# Patient Record
Sex: Female | Born: 1965
Health system: Southern US, Community
[De-identification: ages and names within clinical notes are randomized; demographics above are authoritative.]

## PROBLEM LIST (undated history)

## (undated) DIAGNOSIS — F329 Major depressive disorder, single episode, unspecified: Secondary | ICD-10-CM

## (undated) DIAGNOSIS — F32A Depression, unspecified: Secondary | ICD-10-CM

## (undated) DIAGNOSIS — I10 Essential (primary) hypertension: Secondary | ICD-10-CM

## (undated) DIAGNOSIS — F411 Generalized anxiety disorder: Secondary | ICD-10-CM

## (undated) DIAGNOSIS — K219 Gastro-esophageal reflux disease without esophagitis: Secondary | ICD-10-CM

## (undated) HISTORY — DX: Depression, unspecified: F32.A

## (undated) HISTORY — DX: Morbid (severe) obesity due to excess calories: E66.01

## (undated) HISTORY — DX: Generalized anxiety disorder: F41.1

## (undated) HISTORY — DX: Gastro-esophageal reflux disease without esophagitis: K21.9

---
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
## 1998-02-11 ENCOUNTER — Emergency Department (HOSPITAL_COMMUNITY): Admission: EM | Admit: 1998-02-11 | Discharge: 1998-02-11 | Payer: Self-pay | Admitting: Emergency Medicine

## 1998-06-01 ENCOUNTER — Ambulatory Visit (HOSPITAL_COMMUNITY): Admission: RE | Admit: 1998-06-01 | Discharge: 1998-06-01 | Payer: Self-pay | Admitting: Family Medicine

## 1998-06-01 ENCOUNTER — Encounter: Payer: Self-pay | Admitting: Family Medicine

## 1998-07-26 ENCOUNTER — Emergency Department (HOSPITAL_COMMUNITY): Admission: EM | Admit: 1998-07-26 | Discharge: 1998-07-26 | Payer: Self-pay | Admitting: *Deleted

## 1999-04-01 ENCOUNTER — Encounter: Admission: RE | Admit: 1999-04-01 | Discharge: 1999-04-01 | Payer: Self-pay | Admitting: Sports Medicine

## 1999-04-01 ENCOUNTER — Other Ambulatory Visit: Admission: RE | Admit: 1999-04-01 | Discharge: 1999-04-01 | Payer: Self-pay | Admitting: *Deleted

## 1999-05-04 ENCOUNTER — Encounter: Admission: RE | Admit: 1999-05-04 | Discharge: 1999-05-04 | Payer: Self-pay | Admitting: Family Medicine

## 1999-06-07 ENCOUNTER — Encounter: Admission: RE | Admit: 1999-06-07 | Discharge: 1999-06-07 | Payer: Self-pay | Admitting: Family Medicine

## 1999-06-16 ENCOUNTER — Encounter: Admission: RE | Admit: 1999-06-16 | Discharge: 1999-06-16 | Payer: Self-pay | Admitting: Family Medicine

## 1999-07-01 ENCOUNTER — Encounter: Admission: RE | Admit: 1999-07-01 | Discharge: 1999-07-01 | Payer: Self-pay | Admitting: Family Medicine

## 1999-08-11 ENCOUNTER — Encounter: Payer: Self-pay | Admitting: Family Medicine

## 1999-08-11 ENCOUNTER — Emergency Department (HOSPITAL_COMMUNITY): Admission: EM | Admit: 1999-08-11 | Discharge: 1999-08-11 | Payer: Self-pay | Admitting: Emergency Medicine

## 1999-08-11 ENCOUNTER — Ambulatory Visit (HOSPITAL_COMMUNITY): Admission: RE | Admit: 1999-08-11 | Discharge: 1999-08-11 | Payer: Self-pay | Admitting: Family Medicine

## 1999-08-11 ENCOUNTER — Encounter: Admission: RE | Admit: 1999-08-11 | Discharge: 1999-08-11 | Payer: Self-pay | Admitting: Family Medicine

## 1999-08-12 ENCOUNTER — Encounter: Admission: RE | Admit: 1999-08-12 | Discharge: 1999-08-12 | Payer: Self-pay | Admitting: Family Medicine

## 1999-08-19 ENCOUNTER — Encounter: Admission: RE | Admit: 1999-08-19 | Discharge: 1999-08-19 | Payer: Self-pay | Admitting: Family Medicine

## 1999-08-22 ENCOUNTER — Encounter: Admission: RE | Admit: 1999-08-22 | Discharge: 1999-08-22 | Payer: Self-pay | Admitting: Family Medicine

## 1999-08-30 ENCOUNTER — Other Ambulatory Visit: Admission: RE | Admit: 1999-08-30 | Discharge: 1999-09-22 | Payer: Self-pay | Admitting: *Deleted

## 1999-08-30 ENCOUNTER — Encounter: Admission: RE | Admit: 1999-08-30 | Discharge: 1999-08-30 | Payer: Self-pay | Admitting: Sports Medicine

## 1999-11-14 ENCOUNTER — Encounter: Admission: RE | Admit: 1999-11-14 | Discharge: 1999-11-14 | Payer: Self-pay | Admitting: Family Medicine

## 1999-11-25 ENCOUNTER — Encounter: Payer: Self-pay | Admitting: Emergency Medicine

## 1999-11-25 ENCOUNTER — Emergency Department (HOSPITAL_COMMUNITY): Admission: EM | Admit: 1999-11-25 | Discharge: 1999-11-25 | Payer: Self-pay | Admitting: Emergency Medicine

## 1999-12-15 ENCOUNTER — Encounter: Admission: RE | Admit: 1999-12-15 | Discharge: 1999-12-15 | Payer: Self-pay | Admitting: Family Medicine

## 2000-07-02 ENCOUNTER — Emergency Department (HOSPITAL_COMMUNITY): Admission: EM | Admit: 2000-07-02 | Discharge: 2000-07-02 | Payer: Self-pay | Admitting: Emergency Medicine

## 2000-11-15 ENCOUNTER — Emergency Department (HOSPITAL_COMMUNITY): Admission: EM | Admit: 2000-11-15 | Discharge: 2000-11-15 | Payer: Self-pay

## 2001-11-15 ENCOUNTER — Encounter: Admission: RE | Admit: 2001-11-15 | Discharge: 2001-11-15 | Payer: Self-pay | Admitting: Family Medicine

## 2001-12-11 ENCOUNTER — Encounter: Admission: RE | Admit: 2001-12-11 | Discharge: 2001-12-11 | Payer: Self-pay | Admitting: Family Medicine

## 2002-02-27 ENCOUNTER — Ambulatory Visit (HOSPITAL_COMMUNITY): Admission: RE | Admit: 2002-02-27 | Discharge: 2002-02-27 | Payer: Self-pay | Admitting: Family Medicine

## 2002-02-27 ENCOUNTER — Encounter: Admission: RE | Admit: 2002-02-27 | Discharge: 2002-02-27 | Payer: Self-pay | Admitting: Family Medicine

## 2002-03-10 ENCOUNTER — Encounter: Admission: RE | Admit: 2002-03-10 | Discharge: 2002-03-10 | Payer: Self-pay | Admitting: Sports Medicine

## 2004-01-29 ENCOUNTER — Emergency Department (HOSPITAL_COMMUNITY): Admission: EM | Admit: 2004-01-29 | Discharge: 2004-01-29 | Payer: Self-pay | Admitting: Emergency Medicine

## 2004-11-07 ENCOUNTER — Emergency Department (HOSPITAL_COMMUNITY): Admission: EM | Admit: 2004-11-07 | Discharge: 2004-11-07 | Payer: Self-pay | Admitting: Emergency Medicine

## 2005-09-03 ENCOUNTER — Encounter (INDEPENDENT_AMBULATORY_CARE_PROVIDER_SITE_OTHER): Payer: Self-pay | Admitting: *Deleted

## 2005-09-21 ENCOUNTER — Encounter (INDEPENDENT_AMBULATORY_CARE_PROVIDER_SITE_OTHER): Payer: Self-pay | Admitting: *Deleted

## 2005-09-21 ENCOUNTER — Other Ambulatory Visit: Admission: RE | Admit: 2005-09-21 | Discharge: 2005-09-21 | Payer: Self-pay | Admitting: Family Medicine

## 2005-09-21 ENCOUNTER — Ambulatory Visit: Payer: Self-pay | Admitting: Family Medicine

## 2005-10-03 ENCOUNTER — Encounter: Admission: RE | Admit: 2005-10-03 | Discharge: 2005-10-03 | Payer: Self-pay | Admitting: Sports Medicine

## 2006-04-10 ENCOUNTER — Encounter: Admission: RE | Admit: 2006-04-10 | Discharge: 2006-04-10 | Payer: Self-pay | Admitting: Sports Medicine

## 2006-04-10 ENCOUNTER — Ambulatory Visit (HOSPITAL_COMMUNITY): Admission: RE | Admit: 2006-04-10 | Discharge: 2006-04-10 | Payer: Self-pay | Admitting: Family Medicine

## 2006-04-10 ENCOUNTER — Ambulatory Visit: Payer: Self-pay | Admitting: Family Medicine

## 2006-04-10 IMAGING — CR DG CHEST 2V
2 series · 2 of 2 positions shown · non-contrast
Comparison: none

CLINICAL DATA: Mid chest pain.  Slight cough.  Smoking history. 
 CHEST ? 2 VIEW:

[view not recorded (1 of 2)]
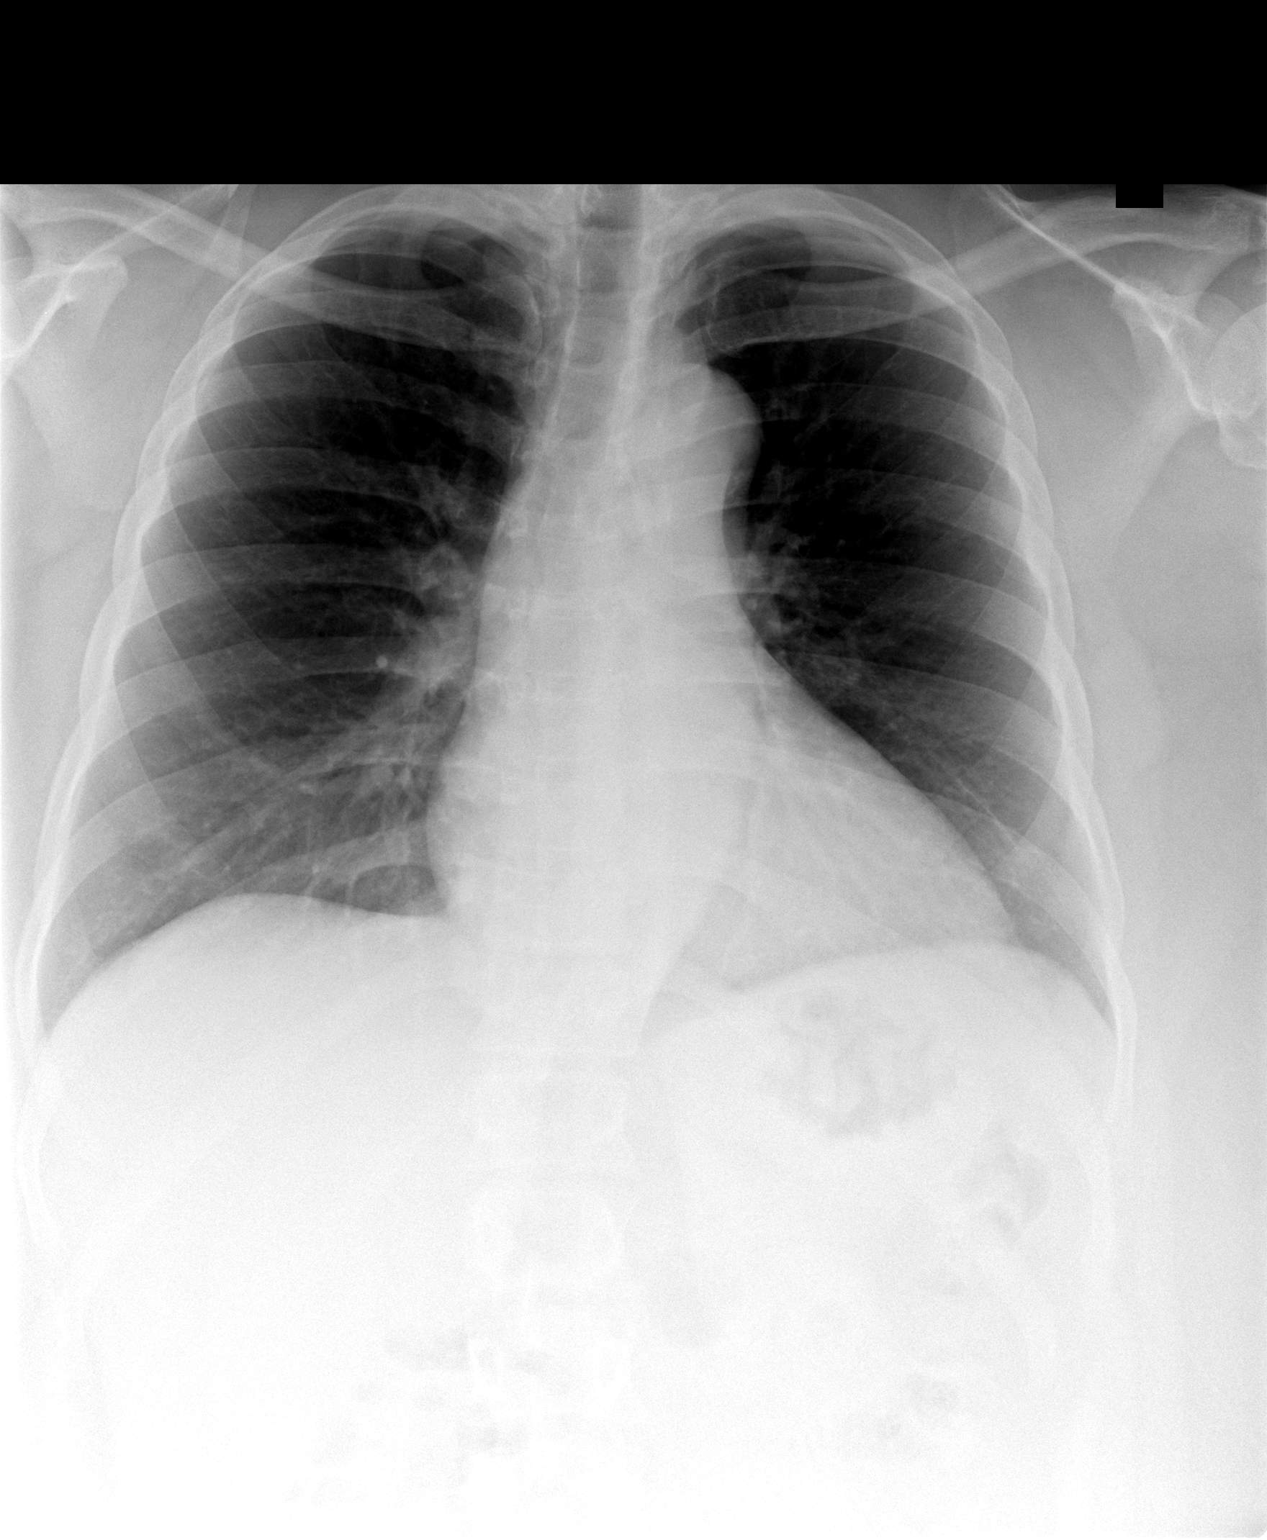

[view not recorded (2 of 2)]
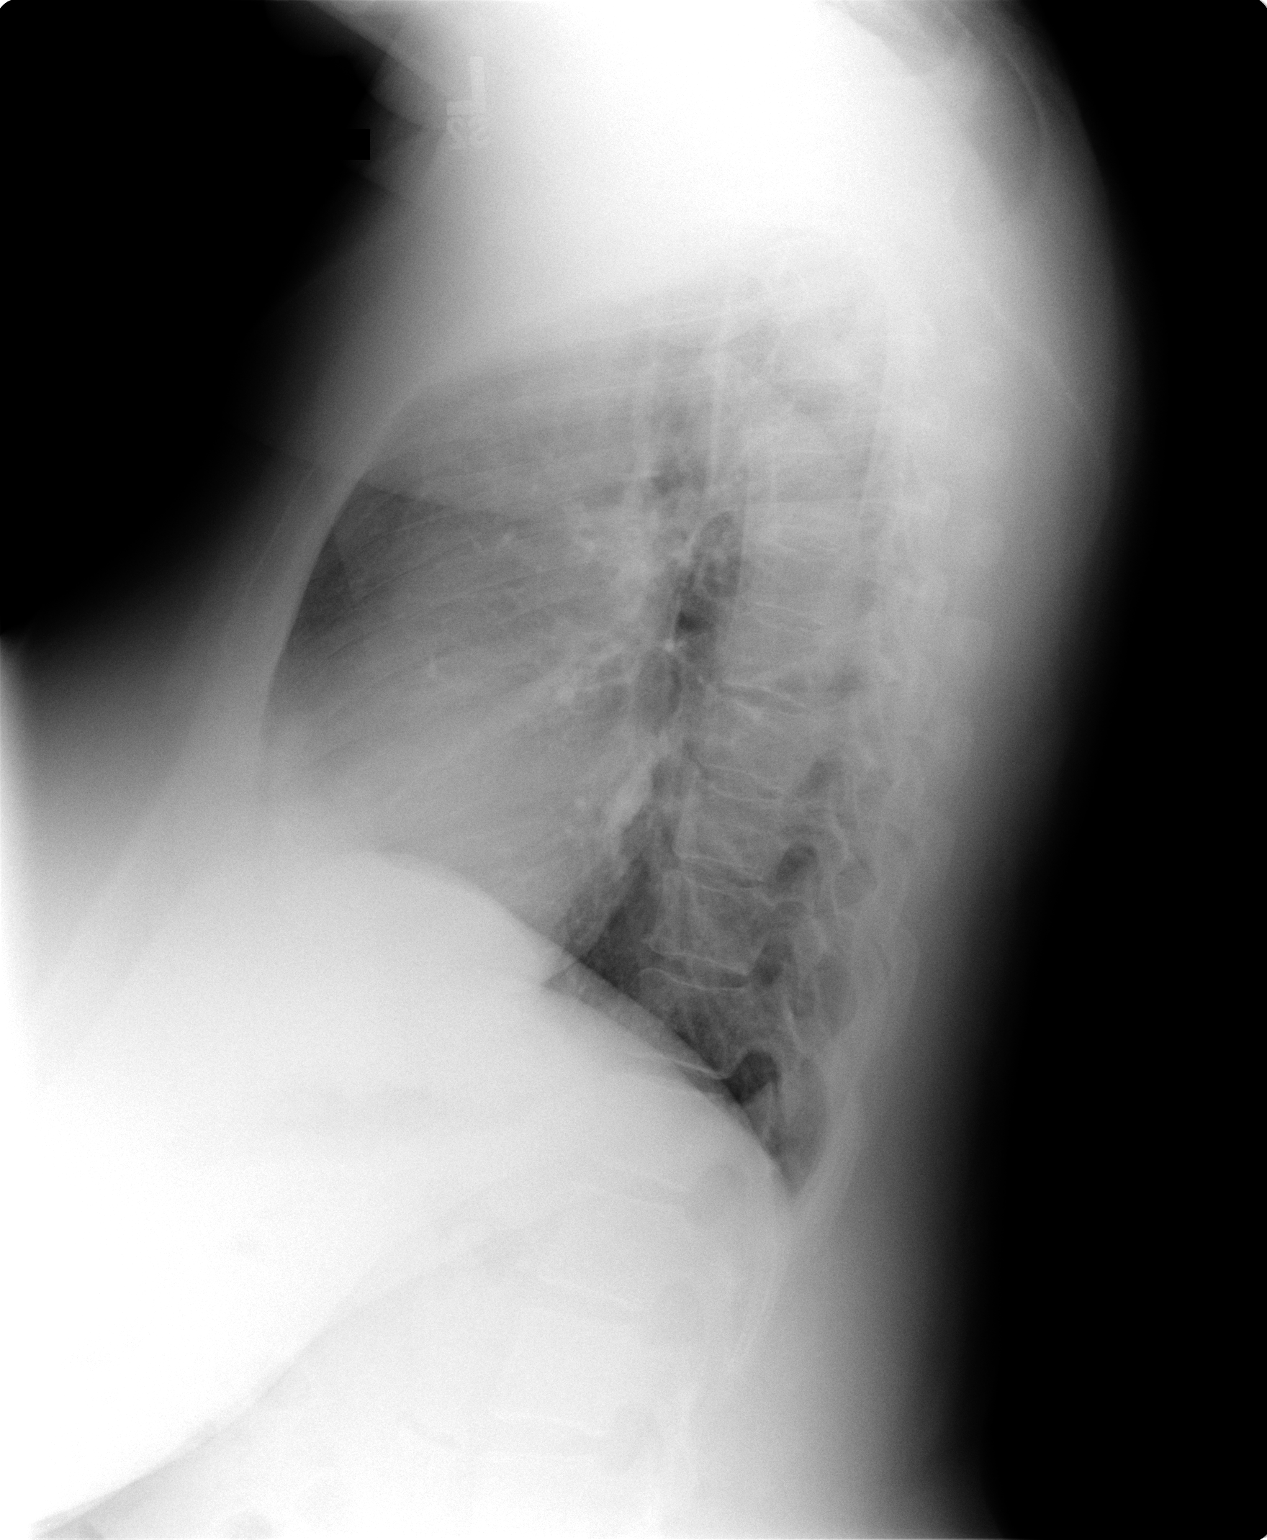

[2 of 2 positions shown; findings below may reference images not displayed]

FINDINGS: Two views of the chest show the lungs to be clear.  The heart is within upper limits of normal.  No acute bony abnormality is seen.
IMPRESSION: No active lung disease.

## 2006-04-11 ENCOUNTER — Ambulatory Visit: Payer: Self-pay | Admitting: Family Medicine

## 2006-04-20 ENCOUNTER — Ambulatory Visit: Payer: Self-pay | Admitting: Family Medicine

## 2006-07-16 ENCOUNTER — Ambulatory Visit: Payer: Self-pay | Admitting: Family Medicine

## 2006-08-02 DIAGNOSIS — Z720 Tobacco use: Secondary | ICD-10-CM | POA: Insufficient documentation

## 2006-08-02 DIAGNOSIS — K219 Gastro-esophageal reflux disease without esophagitis: Secondary | ICD-10-CM

## 2006-08-02 DIAGNOSIS — I10 Essential (primary) hypertension: Secondary | ICD-10-CM

## 2006-08-02 DIAGNOSIS — F329 Major depressive disorder, single episode, unspecified: Secondary | ICD-10-CM

## 2006-08-02 DIAGNOSIS — F17200 Nicotine dependence, unspecified, uncomplicated: Secondary | ICD-10-CM

## 2006-08-02 DIAGNOSIS — E669 Obesity, unspecified: Secondary | ICD-10-CM

## 2006-08-02 DIAGNOSIS — M545 Low back pain: Secondary | ICD-10-CM

## 2006-08-02 HISTORY — DX: Gastro-esophageal reflux disease without esophagitis: K21.9

## 2006-08-02 HISTORY — DX: Essential (primary) hypertension: I10

## 2006-08-03 ENCOUNTER — Encounter (INDEPENDENT_AMBULATORY_CARE_PROVIDER_SITE_OTHER): Payer: Self-pay | Admitting: *Deleted

## 2006-12-11 ENCOUNTER — Encounter (INDEPENDENT_AMBULATORY_CARE_PROVIDER_SITE_OTHER): Payer: Self-pay | Admitting: Family Medicine

## 2006-12-11 ENCOUNTER — Ambulatory Visit: Payer: Self-pay | Admitting: Family Medicine

## 2006-12-11 DIAGNOSIS — I10 Essential (primary) hypertension: Secondary | ICD-10-CM

## 2006-12-12 ENCOUNTER — Encounter: Payer: Self-pay | Admitting: Family Medicine

## 2006-12-12 LAB — CONVERTED CEMR LAB: Pap Smear: NORMAL

## 2006-12-19 ENCOUNTER — Encounter: Payer: Self-pay | Admitting: Family Medicine

## 2006-12-27 ENCOUNTER — Telehealth (INDEPENDENT_AMBULATORY_CARE_PROVIDER_SITE_OTHER): Payer: Self-pay | Admitting: *Deleted

## 2006-12-31 ENCOUNTER — Encounter: Payer: Self-pay | Admitting: Family Medicine

## 2006-12-31 ENCOUNTER — Ambulatory Visit: Payer: Self-pay | Admitting: Family Medicine

## 2006-12-31 DIAGNOSIS — N926 Irregular menstruation, unspecified: Secondary | ICD-10-CM

## 2006-12-31 DIAGNOSIS — K644 Residual hemorrhoidal skin tags: Secondary | ICD-10-CM | POA: Insufficient documentation

## 2006-12-31 LAB — CONVERTED CEMR LAB: Beta hcg, urine, semiquantitative: NEGATIVE

## 2007-01-03 ENCOUNTER — Encounter: Payer: Self-pay | Admitting: Family Medicine

## 2007-02-27 ENCOUNTER — Encounter (INDEPENDENT_AMBULATORY_CARE_PROVIDER_SITE_OTHER): Payer: Self-pay | Admitting: Family Medicine

## 2007-02-27 ENCOUNTER — Telehealth (INDEPENDENT_AMBULATORY_CARE_PROVIDER_SITE_OTHER): Payer: Self-pay | Admitting: *Deleted

## 2007-02-27 ENCOUNTER — Ambulatory Visit: Payer: Self-pay | Admitting: Family Medicine

## 2007-02-27 DIAGNOSIS — A088 Other specified intestinal infections: Secondary | ICD-10-CM

## 2007-03-07 ENCOUNTER — Encounter: Admission: RE | Admit: 2007-03-07 | Discharge: 2007-03-07 | Payer: Self-pay | Admitting: Sports Medicine

## 2007-03-08 ENCOUNTER — Encounter: Payer: Self-pay | Admitting: Family Medicine

## 2007-09-05 ENCOUNTER — Encounter (INDEPENDENT_AMBULATORY_CARE_PROVIDER_SITE_OTHER): Payer: Self-pay | Admitting: *Deleted

## 2007-10-04 ENCOUNTER — Ambulatory Visit (HOSPITAL_COMMUNITY): Admission: RE | Admit: 2007-10-04 | Discharge: 2007-10-04 | Payer: Self-pay | Admitting: Family Medicine

## 2007-10-04 ENCOUNTER — Ambulatory Visit: Payer: Self-pay | Admitting: Family Medicine

## 2007-10-04 ENCOUNTER — Encounter: Payer: Self-pay | Admitting: Family Medicine

## 2007-10-04 DIAGNOSIS — R079 Chest pain, unspecified: Secondary | ICD-10-CM

## 2007-10-04 HISTORY — DX: Chest pain, unspecified: R07.9

## 2007-10-04 LAB — CONVERTED CEMR LAB
BUN: 11 mg/dL (ref 6–23)
Chloride: 103 meq/L (ref 96–112)
Creatinine, Ser: 0.99 mg/dL (ref 0.40–1.20)
Glucose, Bld: 98 mg/dL (ref 70–99)
LDL Cholesterol: 79 mg/dL (ref 0–99)
Potassium: 3.7 meq/L (ref 3.5–5.3)
VLDL: 14 mg/dL (ref 0–40)

## 2007-10-07 ENCOUNTER — Ambulatory Visit: Payer: Self-pay | Admitting: Internal Medicine

## 2007-10-08 ENCOUNTER — Encounter: Payer: Self-pay | Admitting: Family Medicine

## 2007-10-09 ENCOUNTER — Encounter: Payer: Self-pay | Admitting: Family Medicine

## 2007-10-18 ENCOUNTER — Encounter: Payer: Self-pay | Admitting: Family Medicine

## 2007-10-18 ENCOUNTER — Ambulatory Visit: Payer: Self-pay

## 2007-10-18 ENCOUNTER — Encounter: Payer: Self-pay | Admitting: Internal Medicine

## 2007-11-15 ENCOUNTER — Telehealth: Payer: Self-pay | Admitting: *Deleted

## 2007-11-18 ENCOUNTER — Ambulatory Visit: Payer: Self-pay | Admitting: Family Medicine

## 2010-08-18 ENCOUNTER — Emergency Department (HOSPITAL_COMMUNITY)
Admission: EM | Admit: 2010-08-18 | Discharge: 2010-08-18 | Disposition: A | Discharge status: Home / Self Care | Payer: Self-pay | Attending: Emergency Medicine | Admitting: Emergency Medicine

## 2010-08-18 DIAGNOSIS — L02219 Cutaneous abscess of trunk, unspecified: Secondary | ICD-10-CM | POA: Insufficient documentation

## 2010-08-18 DIAGNOSIS — R109 Unspecified abdominal pain: Secondary | ICD-10-CM | POA: Insufficient documentation

## 2010-08-18 DIAGNOSIS — R1909 Other intra-abdominal and pelvic swelling, mass and lump: Secondary | ICD-10-CM | POA: Insufficient documentation

## 2010-08-18 DIAGNOSIS — R599 Enlarged lymph nodes, unspecified: Secondary | ICD-10-CM | POA: Insufficient documentation

## 2010-08-21 ENCOUNTER — Emergency Department (HOSPITAL_COMMUNITY)
Admission: EM | Admit: 2010-08-21 | Discharge: 2010-08-21 | Disposition: A | Discharge status: Home / Self Care | Payer: Self-pay | Attending: Emergency Medicine | Admitting: Emergency Medicine

## 2010-08-21 DIAGNOSIS — Z4800 Encounter for change or removal of nonsurgical wound dressing: Secondary | ICD-10-CM | POA: Insufficient documentation

## 2010-08-21 DIAGNOSIS — L02219 Cutaneous abscess of trunk, unspecified: Secondary | ICD-10-CM | POA: Insufficient documentation

## 2010-08-21 DIAGNOSIS — Z09 Encounter for follow-up examination after completed treatment for conditions other than malignant neoplasm: Secondary | ICD-10-CM | POA: Insufficient documentation

## 2010-10-18 NOTE — Assessment & Plan Note (Signed)
[facility]                            CARDIOLOGY OFFICE NOTE   NAME:Janice [name], Janice [name]                     MRN:          [id number]  DATE:10/07/2007                            DOB:          Jul 29, 1965    IDENTIFICATION:  Janice [name] is a 45 year old woman who is referred from  [facility] at [facility] for evaluation of chest pain and  hypertension.   HISTORY OF PRESENT ILLNESS:  The patient has no known history of  coronary artery disease.  She says over the past 2 years she has had  episodes of chest pain.  She points to her right parasternal area.  It  occurs on and off for the past 2 years and has become more frequent.  Occasionally helped by a big breath.  Not associated with any particular  activity.  It will last for about 10 minutes and ease on its own.  Occasionally radiates to the right arm.  it is usually heavy, sometimes  stabbing.  She has a history of reflux, but does not think that this is  it.   ALLERGIES:  None.   CURRENT MEDICATIONS:  She has not filled the prescriptions for Norvasc  10, Protonix 40, HCTZ 12.5, Lexapro 10, or Chantix starter pack.  She is  waiting for her Medicaid card.   PAST MEDICAL HISTORY:  Hypertension.   SOCIAL HISTORY:  The patient is a Production designer, theatre/television/film at [employer].  She smokes  1/2 pack per day for the past 14 years.  Does not drink.   FAMILY HISTORY:  Mother died of AIDS, had hypertension.  Sister died of  a motor vehicle accident.  Grandmother died of CVA at age 37.  One  brother alive and well.  Father alive and well.   REVIEW OF SYSTEMS:  All systems reviewed, negative to the above problem,  except as noted above.  Note:  Lipid panel at [facility] include  total cholesterol 134, HDL of 41, LDL of 79, triglycerides of 71.  Creatinine of 1.   PHYSICAL EXAMINATION:  GENERAL:  Patient currently in no distress.  VITAL SIGNS:  Blood pressure 178/110, pulse is 98 and regular, weight  228, height is  5 feet.  HEENT:  Normocephalic, atraumatic, EOMI, PERL.  Mucous membranes are  moist.  NECK:  No audible bruits.  No obvious JVD.  LUNGS:  Clear, without rales or wheezes.  Moving air.  CARDIAC:  Regular rate and rhythm.  S1, S2.  No S3, S4, or murmurs.  CHEST:  No significant tenderness.  ABDOMEN:  Obese, benign.  No hepatomegaly.  EXTREMITIES:  Good distal pulses.  No lower extremity edema.   IMPRESSION:  1. Chest pain.  It is atypical.  She does have risk factors, though I      would send for a stress Myoview to confirm rule out ischemia.  2. Blood pressure high.  I have given her a prescription for      metoprolol 25 b.i.d.  This will cost her $4 until she gets her      Medicaid card.  I would like  to see her back in 4 weeks on these      medicines.  3. Healthcare maintenance.  Lipid panel looks good.  Discussed diet.      Discussed exercise.  Discussed particularly smoking cessation.      Again, she needs her medications filled.   I will set to see her back again, as noted above.     Pricilla Riffle, MD, [facility]  Electronically Signed    PVR/MedQ  DD: 10/07/2007  DT: 10/07/2007  Job #: [id number]   cc:   [name]  Ancil Boozer, MD

## 2010-10-21 NOTE — Procedures (Signed)
[facility]  Patient:    Janice [name], KUBA                     MRN: [id number] Proc. Date: 07/02/00 Adm. Date:  [id number] Attending:  Osvaldo Human                           Procedure Report  CHIEF COMPLAINT:  Perirectal abscess on the left side.  HISTORY OF PRESENT ILLNESS:  This is a 45 year old woman who comes in with a 3-day history of worsening pain on the left buttock. She previously had a perirectal abscess drained in 1997 in the same area.  PROCEDURE:  The fluctuant area was easily palpable. The area was prepped with Betadine and injected with about 6 cc of 1% lidocaine. A radial incision was made initially not getting into anything but the old scar tissue; but eventually, I got into a pocket containing yellow, brown, anaerobic-smelling pus. She will be placed on Augmentin 875 b.i.d. for 5 days and will be given some Vicodin for pain.  IMPRESSION:  Perirectal abscess, status post drainage. DD:  07/02/00 TD:  07/02/00 Job: [id number]

## 2011-06-23 ENCOUNTER — Emergency Department (HOSPITAL_COMMUNITY)
Admission: EM | Admit: 2011-06-23 | Discharge: 2011-06-23 | Disposition: A | Discharge status: Home / Self Care | Payer: Self-pay | Attending: Emergency Medicine | Admitting: Emergency Medicine

## 2011-06-23 ENCOUNTER — Encounter (HOSPITAL_COMMUNITY): Payer: Self-pay | Admitting: *Deleted

## 2011-06-23 DIAGNOSIS — M545 Low back pain, unspecified: Secondary | ICD-10-CM | POA: Insufficient documentation

## 2011-06-23 DIAGNOSIS — E669 Obesity, unspecified: Secondary | ICD-10-CM | POA: Insufficient documentation

## 2011-06-23 DIAGNOSIS — Z79899 Other long term (current) drug therapy: Secondary | ICD-10-CM | POA: Insufficient documentation

## 2011-06-23 DIAGNOSIS — M549 Dorsalgia, unspecified: Secondary | ICD-10-CM

## 2011-06-23 DIAGNOSIS — I10 Essential (primary) hypertension: Secondary | ICD-10-CM | POA: Insufficient documentation

## 2011-06-23 HISTORY — DX: Essential (primary) hypertension: I10

## 2011-06-23 LAB — URINALYSIS, ROUTINE W REFLEX MICROSCOPIC
Glucose, UA: NEGATIVE mg/dL
Hgb urine dipstick: NEGATIVE
Ketones, ur: 15 mg/dL — AB
Nitrite: NEGATIVE
Protein, ur: NEGATIVE mg/dL
Specific Gravity, Urine: 1.034 — ABNORMAL HIGH (ref 1.005–1.030)
Urobilinogen, UA: 1 mg/dL (ref 0.0–1.0)
pH: 6 (ref 5.0–8.0)

## 2011-06-23 LAB — POCT I-STAT, CHEM 8
BUN: 13 mg/dL (ref 6–23)
Calcium, Ion: 1.19 mmol/L (ref 1.12–1.32)
Chloride: 100 mEq/L (ref 96–112)
Creatinine, Ser: 1.1 mg/dL (ref 0.50–1.10)
Glucose, Bld: 92 mg/dL (ref 70–99)
HCT: 45 % (ref 36.0–46.0)
Hemoglobin: 15.3 g/dL — ABNORMAL HIGH (ref 12.0–15.0)
Potassium: 3.6 mEq/L (ref 3.5–5.1)
Sodium: 142 mEq/L (ref 135–145)
TCO2: 33 mmol/L (ref 0–100)

## 2011-06-23 LAB — URINE MICROSCOPIC-ADD ON

## 2011-06-23 LAB — POCT PREGNANCY, URINE: Preg Test, Ur: NEGATIVE

## 2011-06-23 MED ORDER — IBUPROFEN 200 MG PO TABS
400.0000 mg | ORAL_TABLET | Freq: Once | ORAL | Status: AC
  Administered 2011-06-23: 400 mg via ORAL
  Filled 2011-06-23: qty 2

## 2011-06-23 MED ORDER — DIAZEPAM 5 MG PO TABS
5.0000 mg | ORAL_TABLET | Freq: Once | ORAL | Status: AC
  Administered 2011-06-23: 5 mg via ORAL
  Filled 2011-06-23: qty 1

## 2011-06-23 MED ORDER — DIAZEPAM 5 MG PO TABS: 5.0000 mg | ORAL_TABLET | Freq: Three times a day (TID) | ORAL | Status: AC | PRN

## 2011-06-23 MED ORDER — NAPROXEN 500 MG PO TABS: 500.0000 mg | ORAL_TABLET | Freq: Two times a day (BID) | ORAL | Status: AC | PRN

## 2011-06-23 MED ORDER — OXYCODONE-ACETAMINOPHEN 5-325 MG PO TABS
1.0000 | ORAL_TABLET | Freq: Once | ORAL | Status: AC
  Administered 2011-06-23: 1 via ORAL
  Filled 2011-06-23: qty 1

## 2011-06-23 NOTE — Discharge Instructions (Signed)
Back Exercises Back exercises help treat and prevent back injuries. The goal of back exercises is to increase the strength of your abdominal and back muscles and the flexibility of your back. These exercises should be started when you no longer have back pain. Back exercises include:  Pelvic Tilt. Lie on your back with your knees bent. Tilt your pelvis until the lower part of your back is against the floor. Hold this position 5 to 10 sec and repeat 5 to 10 times.   Knee to Chest. Pull first 1 knee up against your chest and hold for 20 to 30 seconds, repeat this with the other knee, and then both knees. This may be done with the other leg straight or bent, whichever feels better.   Sit-Ups or Curl-Ups. Bend your knees 90 degrees. Start with tilting your pelvis, and do a partial, slow sit-up, lifting your trunk only 30 to 45 degrees off the floor. Take at least 2 to 3 seconds for each sit-up. Do not do sit-ups with your knees out straight. If partial sit-ups are difficult, simply do the above but with only tightening your abdominal muscles and holding it as directed.   Hip-Lift. Lie on your back with your knees flexed 90 degrees. Push down with your feet and shoulders as you raise your hips a couple inches off the floor; hold for 10 seconds, repeat 5 to 10 times.   Back arches. Lie on your stomach, propping yourself up on bent elbows. Slowly press on your hands, causing an arch in your low back. Repeat 3 to 5 times. Any initial stiffness and discomfort should lessen with repetition over time.   Shoulder-Lifts. Lie face down with arms beside your body. Keep hips and torso pressed to floor as you slowly lift your head and shoulders off the floor.  Do not overdo your exercises, especially in the beginning. Exercises may cause you some mild back discomfort which lasts for a few minutes; however, if the pain is more severe, or lasts for more than 15 minutes, do not continue exercises until you see your  caregiver. Improvement with exercise therapy for back problems is slow.  See your caregivers for assistance with developing a proper back exercise program. Document Released: 06/29/2004 Document Revised: 01/18/2011 Document Reviewed: 05/22/2005 Exit[employer] Patient Information 2012 [employer]. 

## 2011-06-23 NOTE — ED Notes (Signed)
Dr Juleen China spoke with patient regarding high blood pressure and follow up with PMD regarding same.

## 2011-06-23 NOTE — ED Notes (Signed)
Pt c/o left lower back pain since Wednesday. Pt reports no pain with urination but odor to urine. Denies fevers. Pt appears in no acute distress.

## 2011-06-23 NOTE — ED Notes (Signed)
Lower back and side pain for one week.  Diarrhea initially none now

## 2011-06-30 NOTE — ED Provider Notes (Signed)
History    45 shoulder female with lower back pain. Radiation to her left lower back. Denies trauma. Gradual onset. Symptoms relatively constant. describes the pain as achy and worse with movement. No urinary complaints. Denies history of back surgery. No fever or chills. Denies history of intravenous drug use. Denies use of blood thinning medication. Denies numbness, tingling or loss of strength. No bladder or bowel incontinence or retention. Is ambulatory.  CSN: [id number]  Arrival date & time 06/23/11  1547   First MD Initiated Contact with Patient 06/23/11 1616      Chief Complaint  Patient presents with  . Back Pain    (Consider location/radiation/quality/duration/timing/severity/associated sxs/prior treatment) HPI  Past Medical History  Diagnosis Date  . Hypertension     History reviewed. No pertinent past surgical history.  History reviewed. No pertinent family history.  History  Substance Use Topics  . Smoking status: Current Everyday Smoker  . Smokeless tobacco: Not on file  . Alcohol Use: Yes    OB History    Grav Para Term Preterm Abortions TAB SAB Ect Mult Living                  Review of Systems   Review of symptoms negative unless otherwise noted in HPI.   Allergies  Neomycin sulfate  Home Medications   Current Outpatient Rx  Name Route Sig Dispense Refill  . LOSARTAN POTASSIUM-HCTZ 100-12.5 MG PO TABS Oral Take 1 tablet by mouth daily.    Marland Kitchen DIAZEPAM 5 MG PO TABS Oral Take 1 tablet (5 mg total) by mouth every 8 (eight) hours as needed (muscle spasm). 10 tablet 0  . NAPROXEN 500 MG PO TABS Oral Take 1 tablet (500 mg total) by mouth 2 (two) times daily as needed. 30 tablet 0    BP 180/114  Pulse 90  Temp(Src) 97.6 F (36.4 C) (Oral)  Resp 22  SpO2 96%  LMP 06/11/2011  Physical Exam  Nursing note and vitals reviewed. Constitutional: She is oriented to person, place, and time. She appears well-developed and well-nourished. No distress.      Sitting up in bed. No cute distress. Obese.  HENT:  Head: Normocephalic and atraumatic.  Eyes: Conjunctivae are normal. Pupils are equal, round, and reactive to light. Right eye exhibits no discharge. Left eye exhibits no discharge.  Neck: Normal range of motion. Neck supple.  Cardiovascular: Normal rate, regular rhythm and normal heart sounds.  Exam reveals no gallop and no friction rub.   No murmur heard. Pulmonary/Chest: Effort normal and breath sounds normal. No respiratory distress.  Abdominal: Soft. She exhibits no distension. There is no tenderness.  Genitourinary:       No costovertebral angle tenderness.  Musculoskeletal: She exhibits no edema and no tenderness.       No midline spinal tenderness. Patient does have mild to moderate tenderness paraspinally at the lumbar region and extending laterally. There are no overlying skin changes.  Lymphadenopathy:    She has no cervical adenopathy.  Neurological: She is alert and oriented to person, place, and time. No cranial nerve deficit. She exhibits normal muscle tone. Coordination normal.       Normal appearing gait  Skin: Skin is warm and dry. She is not diaphoretic.  Psychiatric: She has a normal mood and affect. Her behavior is normal. Thought content normal.    ED Course  Procedures (including critical care time)  Labs Reviewed  URINALYSIS, ROUTINE W REFLEX MICROSCOPIC - Abnormal; Notable for the following:  Specific Gravity, Urine 1.034 (*)    Bilirubin Urine SMALL (*)    Ketones, ur 15 (*)    Leukocytes, UA TRACE (*)    All other components within normal limits  URINE MICROSCOPIC-ADD ON - Abnormal; Notable for the following:    Squamous Epithelial / LPF FEW (*)    Casts HYALINE CASTS (*)    All other components within normal limits  POCT I-STAT, CHEM 8 - Abnormal; Notable for the following:    Hemoglobin 15.3 (*)    All other components within normal limits  POCT PREGNANCY, URINE  LAB REPORT - SCANNED  POCT  PREGNANCY, URINE  I-STAT, CHEM 8   No results found.   1. Back pain       MDM  44 show female with back pain. Suspect muscle sprain/strain. She has no midline tenderness. Doubt spinal epidural abscess or hematoma. Doubt cauda equina. Doubt fracture without history of trauma and no history of malignancy. Doubt discitis or osteomyelitis. Nonfocal neurological examination. Feel that imaging would be very low yield at this time. Analysis unremarkable. Patient has no urinary complaints as well. Plan symptomatic treatment. Strict return precautions were discussed. Outpatient followup as needed.        Raeford Razor, MD 06/30/11 [phone]

## 2013-09-18 ENCOUNTER — Encounter (HOSPITAL_COMMUNITY): Payer: Self-pay | Admitting: Emergency Medicine

## 2013-09-18 ENCOUNTER — Emergency Department (HOSPITAL_COMMUNITY)
Admission: EM | Admit: 2013-09-18 | Discharge: 2013-09-18 | Disposition: A | Discharge status: Home / Self Care | Payer: No Typology Code available for payment source | Attending: Emergency Medicine | Admitting: Emergency Medicine

## 2013-09-18 DIAGNOSIS — K5289 Other specified noninfective gastroenteritis and colitis: Secondary | ICD-10-CM | POA: Insufficient documentation

## 2013-09-18 DIAGNOSIS — Z79899 Other long term (current) drug therapy: Secondary | ICD-10-CM | POA: Insufficient documentation

## 2013-09-18 DIAGNOSIS — K529 Noninfective gastroenteritis and colitis, unspecified: Secondary | ICD-10-CM

## 2013-09-18 DIAGNOSIS — E860 Dehydration: Secondary | ICD-10-CM | POA: Insufficient documentation

## 2013-09-18 DIAGNOSIS — J40 Bronchitis, not specified as acute or chronic: Secondary | ICD-10-CM | POA: Insufficient documentation

## 2013-09-18 DIAGNOSIS — I10 Essential (primary) hypertension: Secondary | ICD-10-CM | POA: Insufficient documentation

## 2013-09-18 DIAGNOSIS — F17200 Nicotine dependence, unspecified, uncomplicated: Secondary | ICD-10-CM | POA: Insufficient documentation

## 2013-09-18 DIAGNOSIS — Z791 Long term (current) use of non-steroidal anti-inflammatories (NSAID): Secondary | ICD-10-CM | POA: Insufficient documentation

## 2013-09-18 LAB — URINALYSIS, ROUTINE W REFLEX MICROSCOPIC
Glucose, UA: NEGATIVE mg/dL
Ketones, ur: 15 mg/dL — AB
Leukocytes, UA: NEGATIVE
Nitrite: NEGATIVE
Protein, ur: 30 mg/dL — AB
Specific Gravity, Urine: 1.03 (ref 1.005–1.030)
Urobilinogen, UA: 0.2 mg/dL (ref 0.0–1.0)
pH: 5.5 (ref 5.0–8.0)

## 2013-09-18 LAB — URINE MICROSCOPIC-ADD ON

## 2013-09-18 LAB — BASIC METABOLIC PANEL
BUN: 22 mg/dL (ref 6–23)
CO2: 28 mEq/L (ref 19–32)
Calcium: 9.5 mg/dL (ref 8.4–10.5)
Chloride: 95 mEq/L — ABNORMAL LOW (ref 96–112)
Creatinine, Ser: 1.54 mg/dL — ABNORMAL HIGH (ref 0.50–1.10)
GFR calc Af Amer: 45 mL/min — ABNORMAL LOW (ref >90)
GFR calc non Af Amer: 39 mL/min — ABNORMAL LOW (ref >90)
Glucose, Bld: 101 mg/dL — ABNORMAL HIGH (ref 70–99)
Potassium: 3.2 mEq/L — ABNORMAL LOW (ref 3.7–5.3)
Sodium: 139 mEq/L (ref 137–147)

## 2013-09-18 LAB — CBC
HCT: 43.2 % (ref 36.0–46.0)
Hemoglobin: 13.8 g/dL (ref 12.0–15.0)
MCH: 24.5 pg — ABNORMAL LOW (ref 26.0–34.0)
MCHC: 31.9 g/dL (ref 30.0–36.0)
MCV: 76.7 fL — ABNORMAL LOW (ref 78.0–100.0)
Platelets: 297 10*3/uL (ref 150–400)
RBC: 5.63 MIL/uL — ABNORMAL HIGH (ref 3.87–5.11)
RDW: 14.9 % (ref 11.5–15.5)
WBC: 14.8 10*3/uL — ABNORMAL HIGH (ref 4.0–10.5)

## 2013-09-18 LAB — I-STAT TROPONIN, ED: Troponin i, poc: 0 ng/mL (ref 0.00–0.08)

## 2013-09-18 MED ORDER — GUAIFENESIN ER 1200 MG PO TB12: 1.0000 | ORAL_TABLET | Freq: Two times a day (BID) | ORAL | Status: DC

## 2013-09-18 MED ORDER — PROMETHAZINE-DM 6.25-15 MG/5ML PO SYRP: 5.0000 mL | ORAL_SOLUTION | Freq: Four times a day (QID) | ORAL | Status: DC | PRN

## 2013-09-18 MED ORDER — SODIUM CHLORIDE 0.9 % IV BOLUS (SEPSIS): 1000.0000 mL | Freq: Once | INTRAVENOUS | Status: DC

## 2013-09-18 MED ORDER — HYDROCHLOROTHIAZIDE 25 MG PO TABS
12.5000 mg | ORAL_TABLET | Freq: Once | ORAL | Status: AC
  Administered 2013-09-18: 12.5 mg via ORAL
  Filled 2013-09-18: qty 1

## 2013-09-18 MED ORDER — SODIUM CHLORIDE 0.9 % IV BOLUS (SEPSIS)
1000.0000 mL | Freq: Once | INTRAVENOUS | Status: AC
  Administered 2013-09-18: 1000 mL via INTRAVENOUS

## 2013-09-18 MED ORDER — AMLODIPINE BESYLATE 10 MG PO TABS
10.0000 mg | ORAL_TABLET | Freq: Once | ORAL | Status: AC
  Administered 2013-09-18: 10 mg via ORAL
  Filled 2013-09-18: qty 1

## 2013-09-18 MED ORDER — ONDANSETRON HCL 4 MG/2ML IJ SOLN
4.0000 mg | Freq: Once | INTRAMUSCULAR | Status: AC
  Administered 2013-09-18: 4 mg via INTRAVENOUS
  Filled 2013-09-18: qty 2

## 2013-09-18 NOTE — ED Notes (Addendum)
Pt reporting dizziness and feeling lightheaded x 2 days. No LOC. Denies any pain. Pt is a x 4. States nausea. Has been off her HTN meds for awhile. States is still dizzy.

## 2013-09-18 NOTE — ED Notes (Signed)
Pt ambulated with an o2 sat above 92 for the duration of the ambulation. It ranged from 92 to 98%. Pt stated she felt fine during ambulation. Her vitals on return were as follows: bp 189/108, 91P, 97o2, 24R

## 2013-09-18 NOTE — ED Notes (Signed)
States lightheaded, diaphoretic and n/v/d since Tuesday

## 2013-09-18 NOTE — ED Provider Notes (Signed)
CSN: [id number]     Arrival date & time 09/18/13  1635 History   First MD Initiated Contact with Patient 09/18/13 1723     Chief Complaint  Patient presents with  . Dizziness     (Consider location/radiation/quality/duration/timing/severity/associated sxs/prior Treatment) HPI Patient presents to the emergency department with nausea, vomiting, diarrhea for the last 3 days.  The patient, states, that today at work.  She felt like she may pass out and had dizziness.  Patient denies chest pain, shortness of breath, weakness fever, back pain, neck pain, pain, bloody stool, dysuria, rash, or syncope.  She states she did not take any medications prior to arrival patient states she's had a cough for the last 3 or 4 weeks due to changes in the weather.  She feels, like this happens every year, when the weather changes.  Patient, states nothing seems make her condition, better or worse Past Medical History  Diagnosis Date  . Hypertension    History reviewed. No pertinent past surgical history. No family history on file. History  Substance Use Topics  . Smoking status: Current Every Day Smoker  . Smokeless tobacco: Not on file  . Alcohol Use: Yes   OB History   Grav Para Term Preterm Abortions TAB SAB Ect Mult Living                 Review of Systems All other systems negative except as documented in the HPI. All pertinent positives and negatives as reviewed in the HPI.   Allergies  Neomycin sulfate  Home Medications   Prior to Admission medications   Medication Sig Start Date End Date Taking? Authorizing Provider  bismuth subsalicylate (PEPTO BISMOL) 262 MG/15ML suspension Take 30 mLs by mouth every 6 (six) hours as needed.   Yes Historical Provider, MD  naproxen sodium (ANAPROX) 220 MG tablet Take 440 mg by mouth 2 (two) times daily with a meal.   Yes Historical Provider, MD  Pseudoephedrine-APAP-DM (DAYQUIL MULTI-SYMPTOM COLD/FLU PO) Take 30 mLs by mouth daily as needed (for cold).    Yes Historical Provider, MD  losartan-hydrochlorothiazide (HYZAAR) 100-12.5 MG per tablet Take 1 tablet by mouth daily.    Historical Provider, MD   BP 165/94  Pulse 84  Temp(Src) 99.5 F (37.5 C) (Oral)  Resp 19  Ht 5' (1.524 m)  Wt 225 lb (102.059 kg)  BMI 43.94 kg/m2  SpO2 97%  LMP 09/11/2013 Physical Exam  Nursing note and vitals reviewed. Constitutional: She is oriented to person, place, and time. She appears well-developed and well-nourished. No distress.  HENT:  Head: Normocephalic and atraumatic.  Mouth/Throat: Oropharynx is clear and moist.  Eyes: Pupils are equal, round, and reactive to light.  Neck: Normal range of motion. Neck supple.  Cardiovascular: Normal rate, regular rhythm and normal heart sounds.  Exam reveals no gallop and no friction rub.   No murmur heard. Pulmonary/Chest: Effort normal and breath sounds normal. No respiratory distress. She has no wheezes.  Neurological: She is alert and oriented to person, place, and time. She exhibits normal muscle tone. Coordination normal.  Skin: Skin is warm and dry.    ED Course  Procedures (including critical care time) Labs Review Labs Reviewed  CBC - Abnormal; Notable for the following:    WBC 14.8 (*)    RBC 5.63 (*)    MCV 76.7 (*)    MCH 24.5 (*)    All other components within normal limits  BASIC METABOLIC PANEL - Abnormal; Notable for the  following:    Potassium 3.2 (*)    Chloride 95 (*)    Glucose, Bld 101 (*)    Creatinine, Ser 1.54 (*)    GFR calc non Af Amer 39 (*)    GFR calc Af Amer 45 (*)    All other components within normal limits  URINALYSIS, ROUTINE W REFLEX MICROSCOPIC - Abnormal; Notable for the following:    APPearance CLOUDY (*)    Hgb urine dipstick TRACE (*)    Bilirubin Urine SMALL (*)    Ketones, ur 15 (*)    Protein, ur 30 (*)    All other components within normal limits  URINE MICROSCOPIC-ADD ON - Abnormal; Notable for the following:    Squamous Epithelial / LPF FEW (*)     Casts HYALINE CASTS (*)    Crystals CA OXALATE CRYSTALS (*)    All other components within normal limits  I-STAT TROPOININ, ED   Patient is feeling better following IV fluids.  We ambulated the patient and she did not feel, dizziness, or feeling like she would pass out.  The patient will be asked to followup with her primary care Dr. told to return here as needed.  Should be given medications for nausea at home.  Patient's best return here as needed.  Told to increase her fluid intake   Carlyle DollyChristopher W Nirvana Blanchett, [place] JerseyPA-C 09/18/13 2146

## 2013-09-18 NOTE — Discharge Instructions (Signed)
Return here as needed.  Increase your fluid intake, rest as much possible. °

## 2013-09-18 NOTE — ED Notes (Signed)
Pt states unable to void at this time. 

## 2013-09-24 NOTE — ED Provider Notes (Signed)
Medical screening examination/treatment/procedure(s) were performed by non-physician practitioner and as supervising physician I was immediately available for consultation/collaboration.   EKG Interpretation   Date/Time:  Thursday September 18 2013 16:44:28 EDT Ventricular Rate:  95 PR Interval:  168 QRS Duration: 84 QT Interval:  376 QTC Calculation: 472 R Axis:   24 Text Interpretation:  Normal sinus rhythm Normal ECG ED PHYSICIAN  INTERPRETATION AVAILABLE IN [facility] Confirmed by TEST, Record  ([id number]) on 09/21/2013 1:16:38 PM       Raeford RazorStephen Cyndal Kasson, MD 09/24/13 1444

## 2014-10-14 IMAGING — CR DG SHOULDER 2+V*L*
3 series · 3 of 3 positions shown · non-contrast
Comparison: None.

CLINICAL DATA: Left shoulder pain

EXAM:
LEFT SHOULDER - 2+ VIEW

[shoulder grashey]
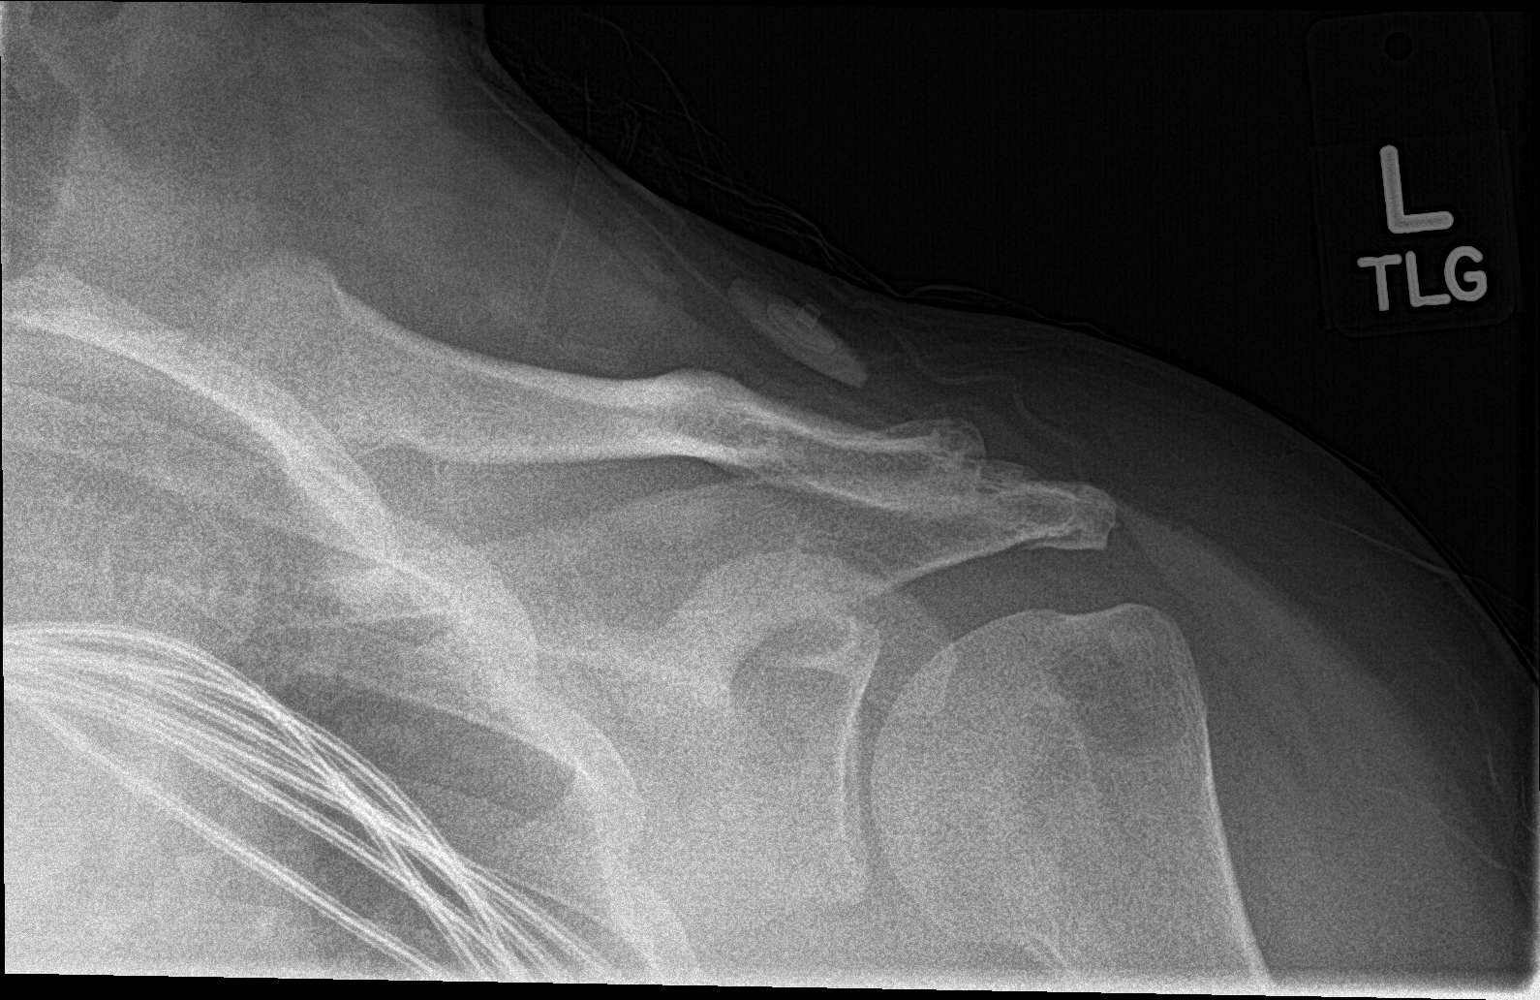

[shoulder y view]
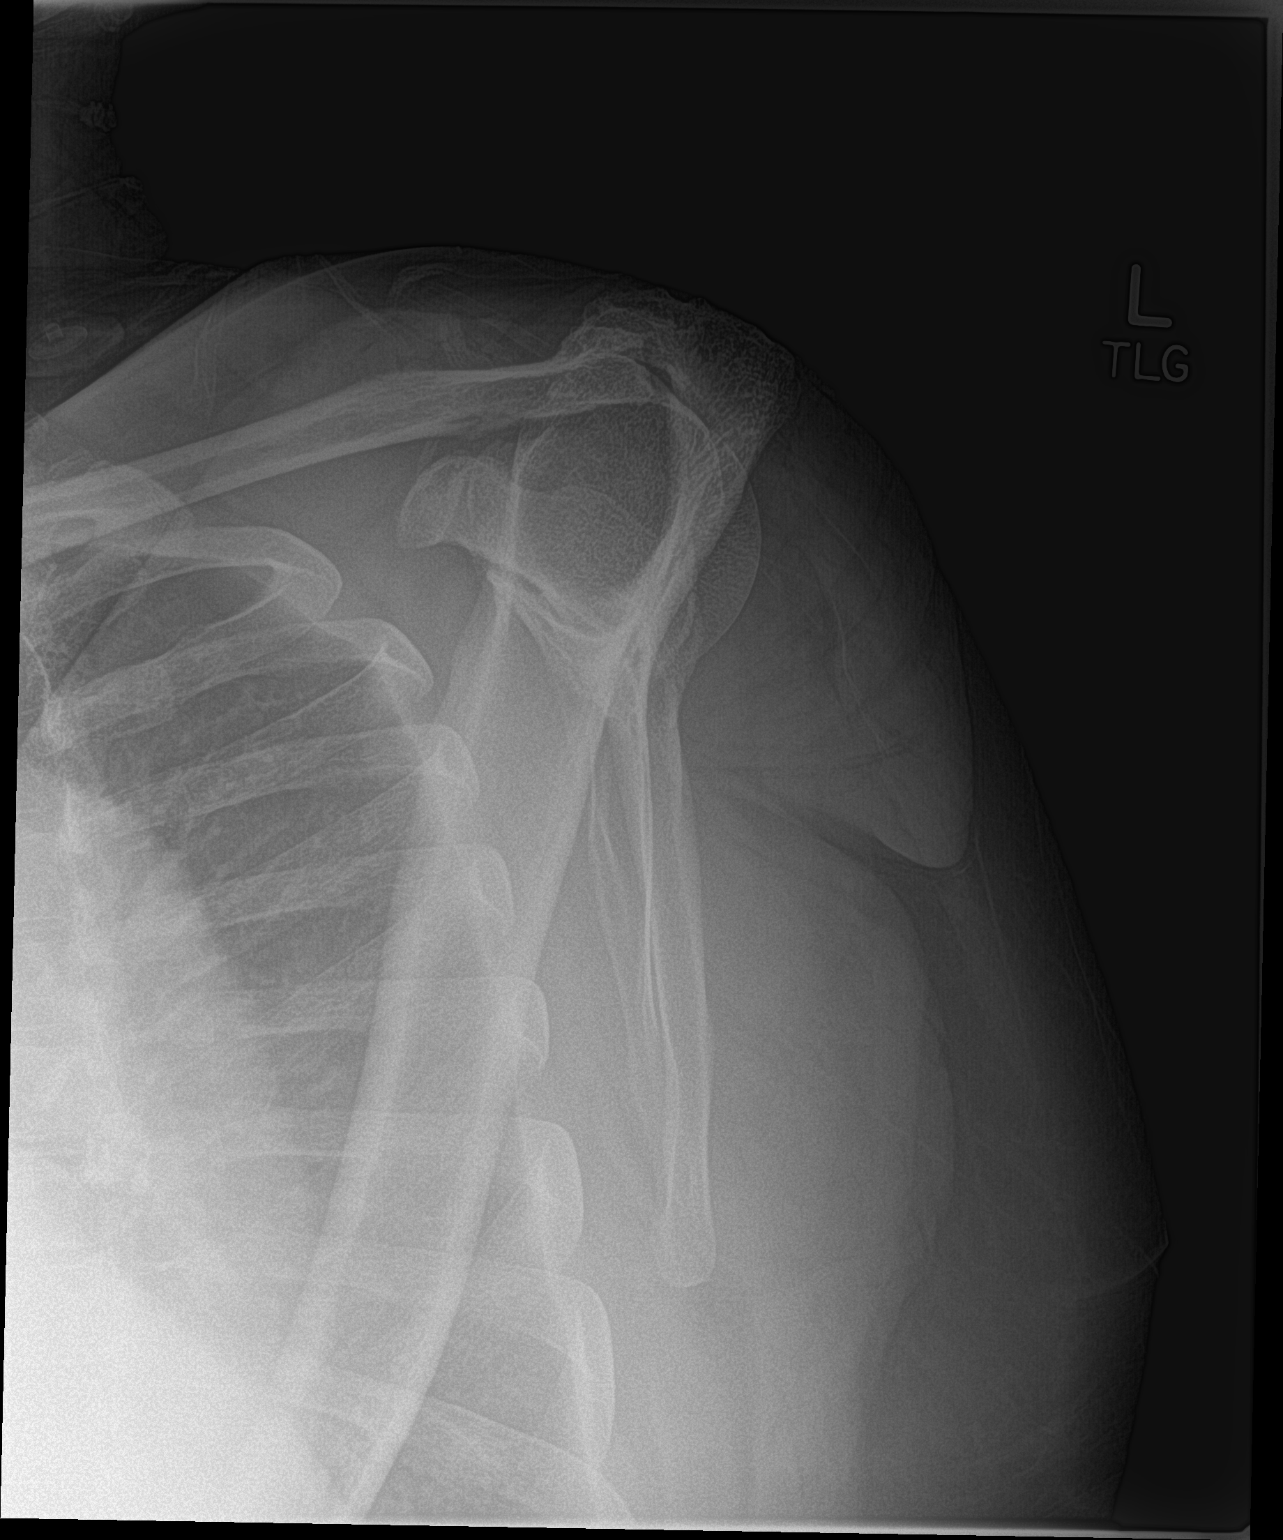

[shoulder axillary]
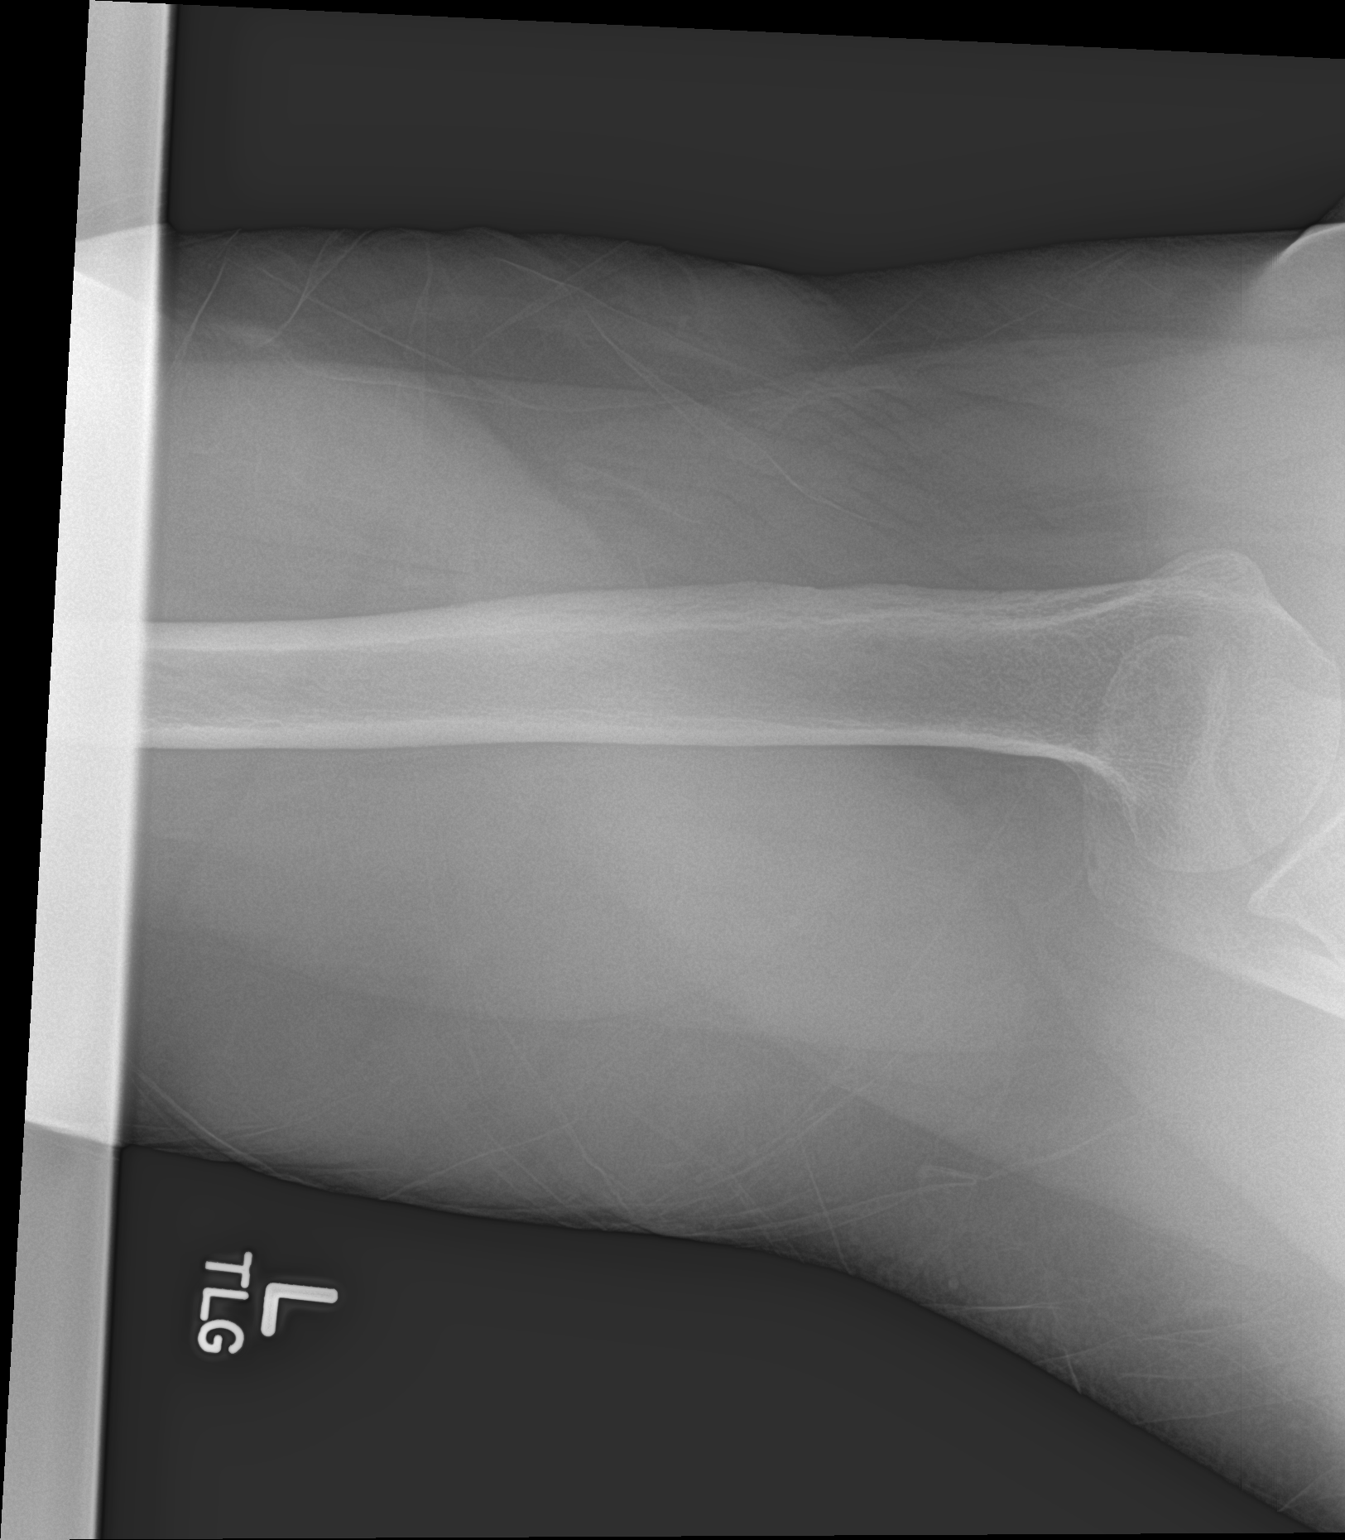

[3 of 3 positions shown; findings below may reference images not displayed]

FINDINGS: No acute fracture. No dislocation. Anatomic alignment at the
glenohumeral joint. Unremarkable soft tissues.
IMPRESSION: No acute bony pathology.

## 2014-11-11 ENCOUNTER — Emergency Department (HOSPITAL_COMMUNITY)
Admission: EM | Admit: 2014-11-11 | Discharge: 2014-11-11 | Disposition: A | Discharge status: Home / Self Care | Payer: No Typology Code available for payment source | Attending: Emergency Medicine | Admitting: Emergency Medicine

## 2014-11-11 ENCOUNTER — Encounter (HOSPITAL_COMMUNITY): Payer: Self-pay | Admitting: *Deleted

## 2014-11-11 ENCOUNTER — Emergency Department (HOSPITAL_COMMUNITY): Payer: No Typology Code available for payment source

## 2014-11-11 DIAGNOSIS — X58XXXA Exposure to other specified factors, initial encounter: Secondary | ICD-10-CM | POA: Insufficient documentation

## 2014-11-11 DIAGNOSIS — R079 Chest pain, unspecified: Secondary | ICD-10-CM | POA: Diagnosis present

## 2014-11-11 DIAGNOSIS — Z79899 Other long term (current) drug therapy: Secondary | ICD-10-CM | POA: Diagnosis not present

## 2014-11-11 DIAGNOSIS — I10 Essential (primary) hypertension: Secondary | ICD-10-CM | POA: Diagnosis not present

## 2014-11-11 DIAGNOSIS — Y939 Activity, unspecified: Secondary | ICD-10-CM | POA: Diagnosis not present

## 2014-11-11 DIAGNOSIS — Y9289 Other specified places as the place of occurrence of the external cause: Secondary | ICD-10-CM | POA: Insufficient documentation

## 2014-11-11 DIAGNOSIS — S46912A Strain of unspecified muscle, fascia and tendon at shoulder and upper arm level, left arm, initial encounter: Secondary | ICD-10-CM | POA: Diagnosis not present

## 2014-11-11 DIAGNOSIS — R0789 Other chest pain: Secondary | ICD-10-CM | POA: Insufficient documentation

## 2014-11-11 DIAGNOSIS — Z720 Tobacco use: Secondary | ICD-10-CM | POA: Insufficient documentation

## 2014-11-11 DIAGNOSIS — Y998 Other external cause status: Secondary | ICD-10-CM | POA: Diagnosis not present

## 2014-11-11 LAB — CBC
HEMATOCRIT: 42.2 % (ref 36.0–46.0)
HEMOGLOBIN: 13.4 g/dL (ref 12.0–15.0)
MCH: 24 pg — AB (ref 26.0–34.0)
MCHC: 31.8 g/dL (ref 30.0–36.0)
MCV: 75.6 fL — AB (ref 78.0–100.0)
PLATELETS: 311 10*3/uL (ref 150–400)
RBC: 5.58 MIL/uL — ABNORMAL HIGH (ref 3.87–5.11)
RDW: 14.4 % (ref 11.5–15.5)
WBC: 13.1 10*3/uL — AB (ref 4.0–10.5)

## 2014-11-11 LAB — COMPREHENSIVE METABOLIC PANEL
ALBUMIN: 3.5 g/dL (ref 3.5–5.0)
ALK PHOS: 108 U/L (ref 38–126)
ALT: 12 U/L — AB (ref 14–54)
AST: 16 U/L (ref 15–41)
Anion gap: 9 (ref 5–15)
BILIRUBIN TOTAL: 0.8 mg/dL (ref 0.3–1.2)
BUN: 9 mg/dL (ref 6–20)
CHLORIDE: 100 mmol/L — AB (ref 101–111)
CO2: 28 mmol/L (ref 22–32)
CREATININE: 1.04 mg/dL — AB (ref 0.44–1.00)
Calcium: 8.8 mg/dL — ABNORMAL LOW (ref 8.9–10.3)
GLUCOSE: 95 mg/dL (ref 65–99)
POTASSIUM: 3 mmol/L — AB (ref 3.5–5.1)
Sodium: 137 mmol/L (ref 135–145)
Total Protein: 8.3 g/dL — ABNORMAL HIGH (ref 6.5–8.1)

## 2014-11-11 LAB — I-STAT TROPONIN, ED
TROPONIN I, POC: 0 ng/mL (ref 0.00–0.08)
Troponin i, poc: 0 ng/mL (ref 0.00–0.08)

## 2014-11-11 LAB — BRAIN NATRIURETIC PEPTIDE: B Natriuretic Peptide: 48.6 pg/mL (ref 0.0–100.0)

## 2014-11-11 IMAGING — DX DG CHEST 2V
2 series · 2 of 2 positions shown · non-contrast
Comparison: Chest x-ray [DATE].

CLINICAL DATA: 49-year-old female with central chest pain and left
arm pain. Shortness of breath for the past 3 days.

EXAM:
CHEST  2 VIEW

[chest pa]
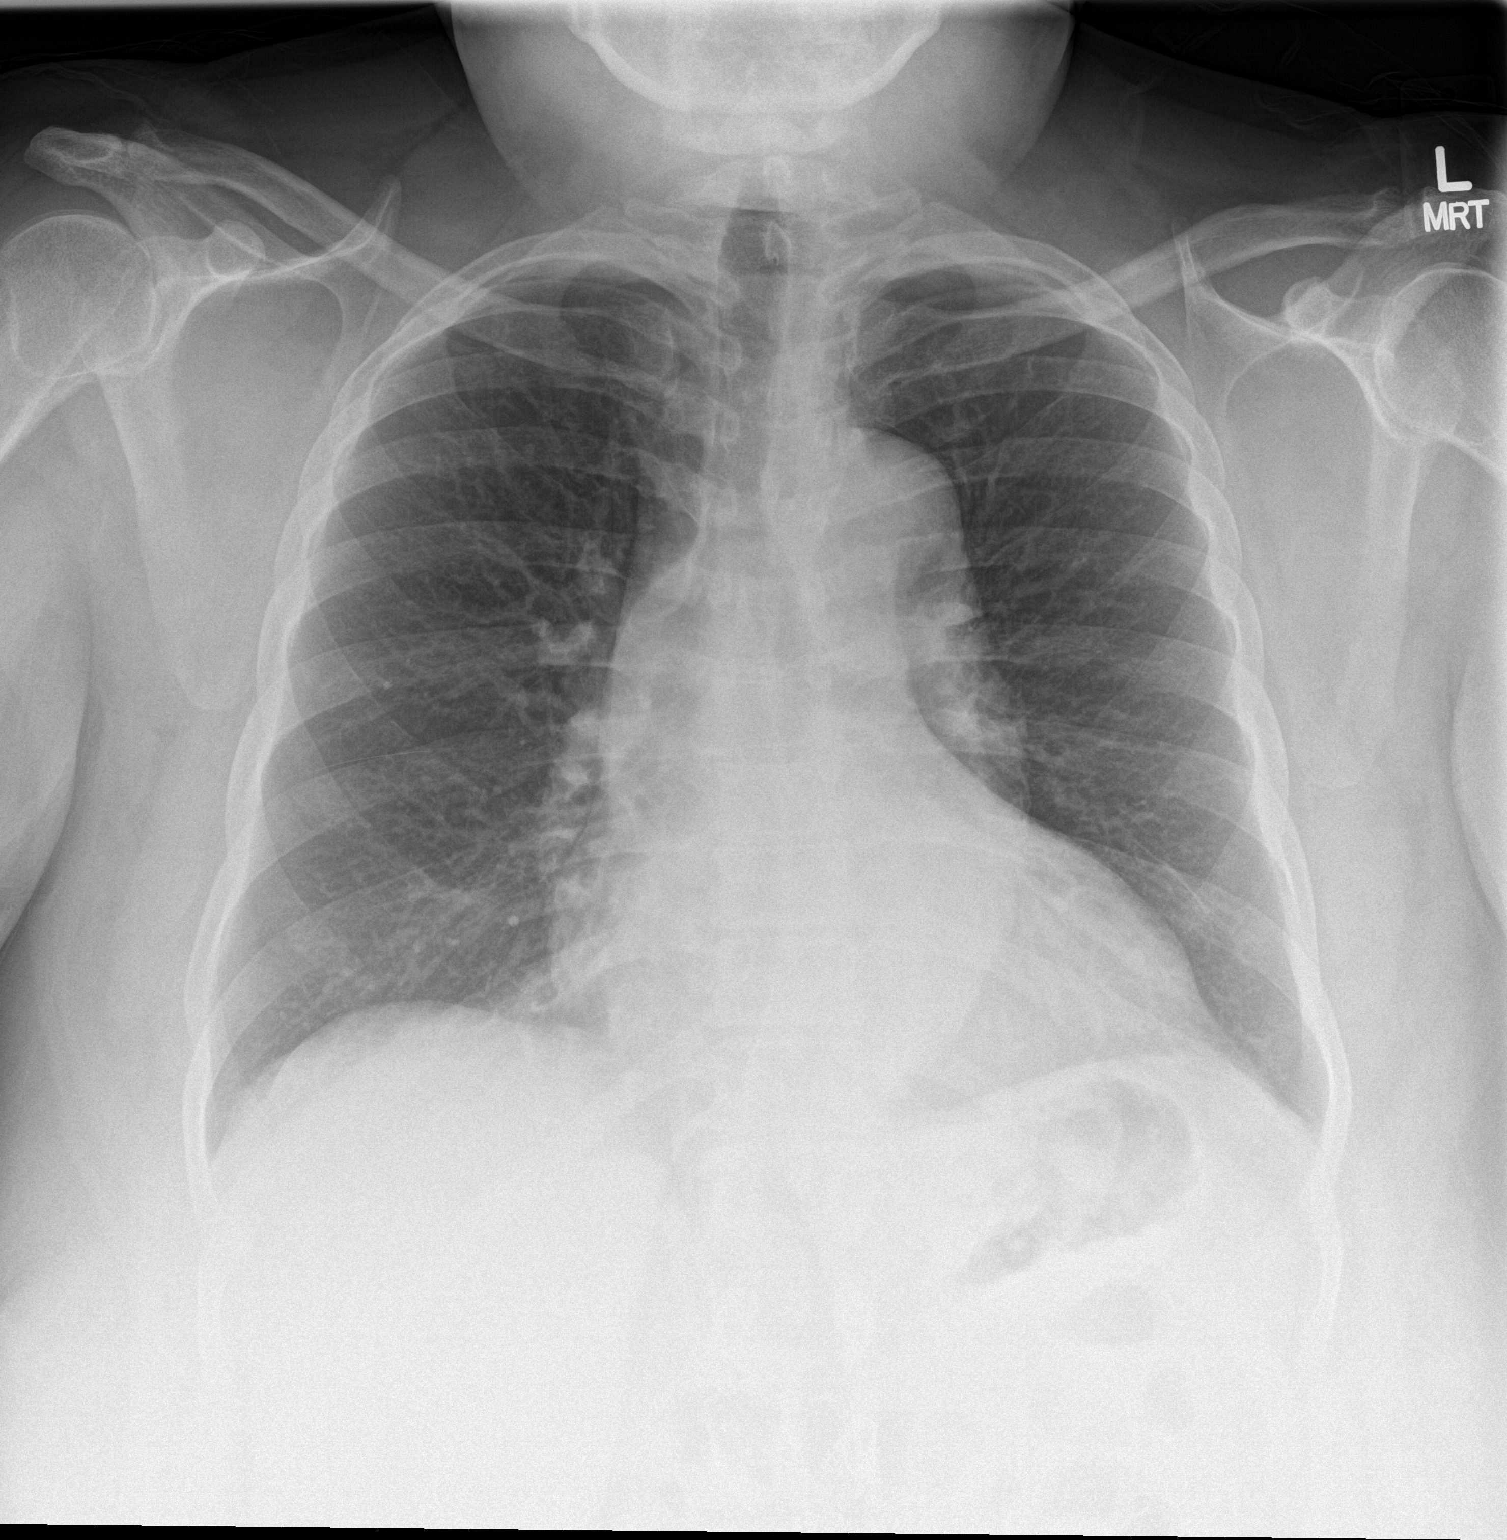

[chest lat]
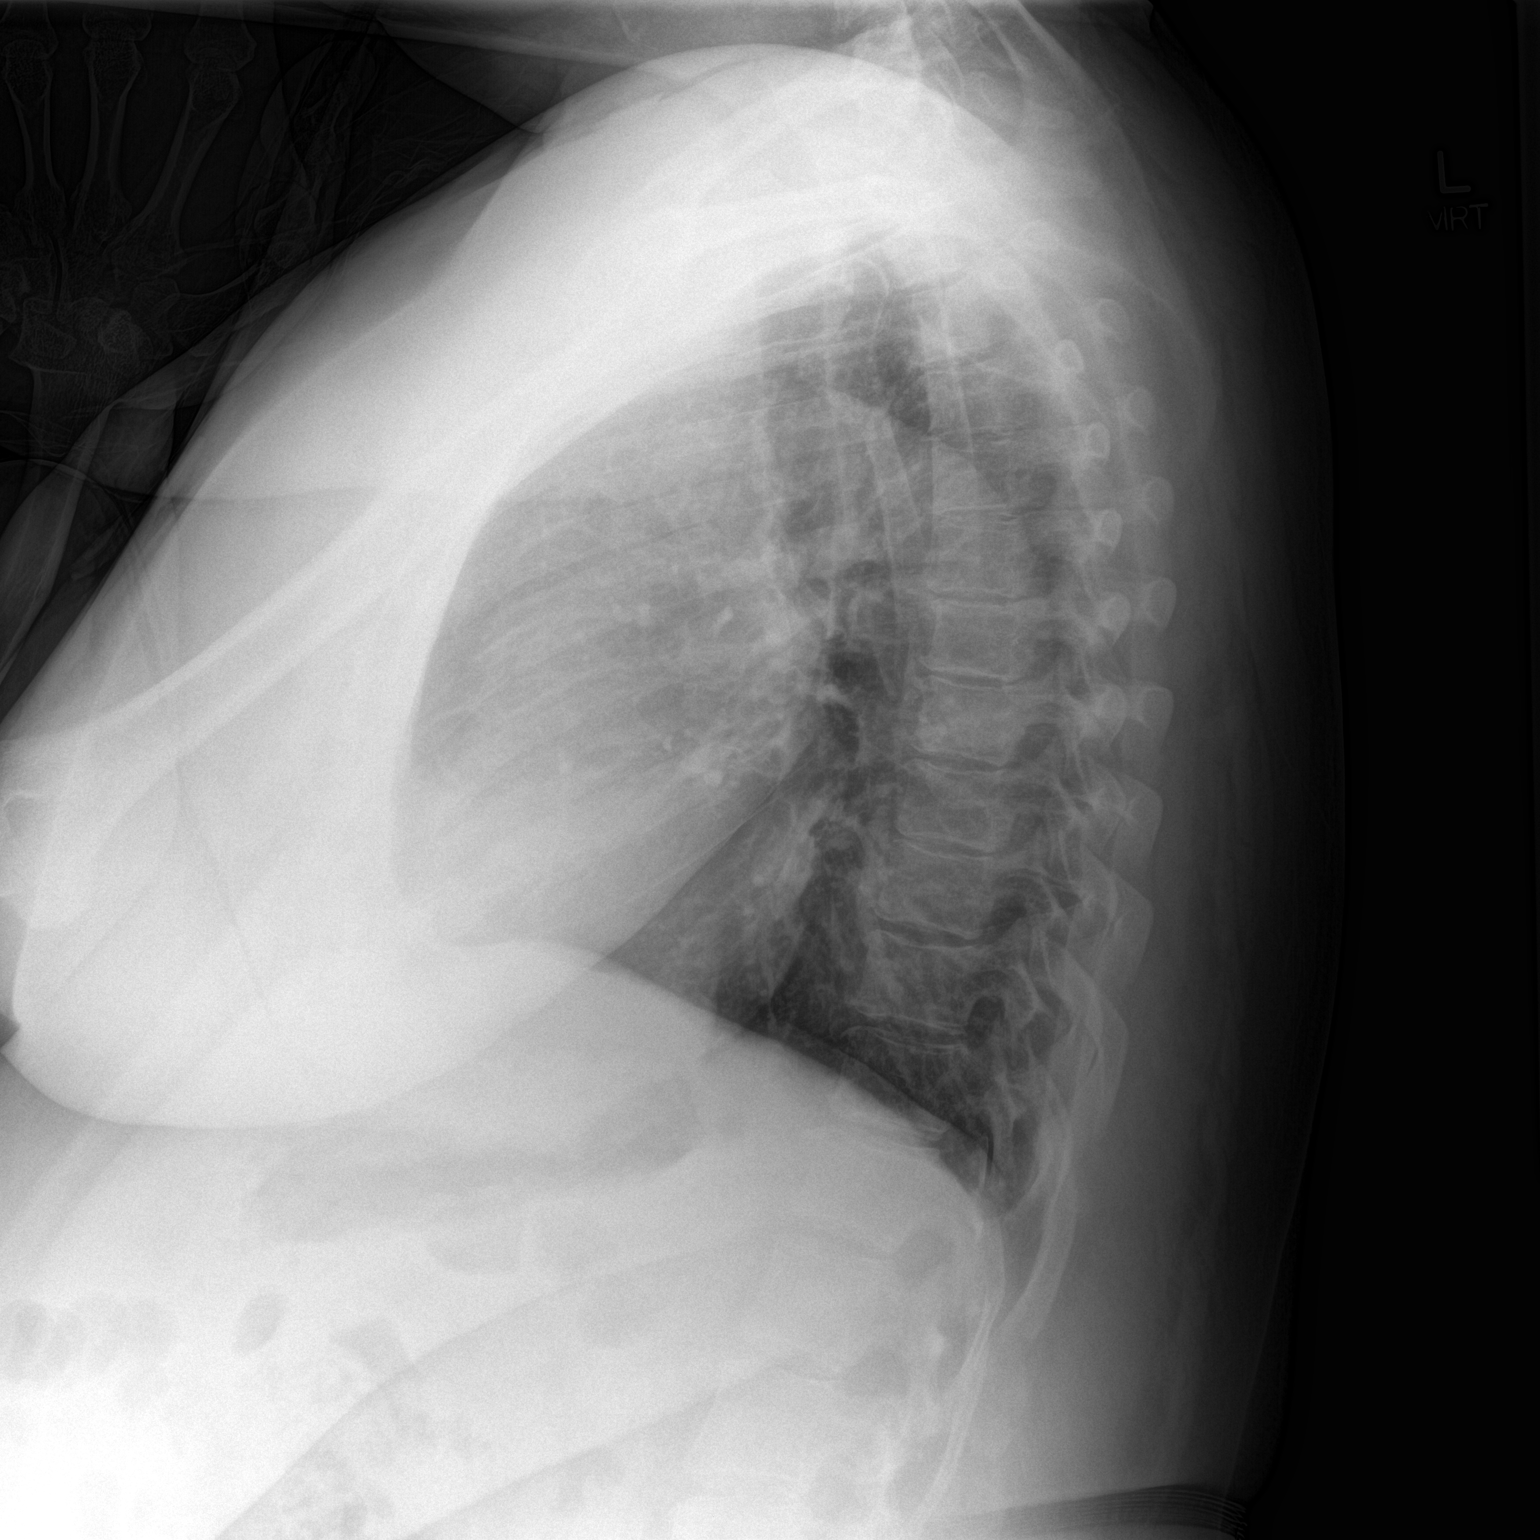

[2 of 2 positions shown; findings below may reference images not displayed]

FINDINGS: Lung volumes are normal. No consolidative airspace disease. No
pleural effusions. No pneumothorax. No pulmonary nodule or mass
noted. Pulmonary vasculature and the cardiomediastinal silhouette
are within normal limits.
IMPRESSION: No radiographic evidence of acute cardiopulmonary disease.

## 2014-11-11 MED ORDER — HYDROCODONE-ACETAMINOPHEN 5-325 MG PO TABS: 1.0000 | ORAL_TABLET | Freq: Four times a day (QID) | ORAL | Status: DC | PRN

## 2014-11-11 MED ORDER — IBUPROFEN 800 MG PO TABS: 800.0000 mg | ORAL_TABLET | Freq: Three times a day (TID) | ORAL | Status: DC | PRN

## 2014-11-11 MED ORDER — MORPHINE SULFATE 4 MG/ML IJ SOLN
4.0000 mg | Freq: Once | INTRAMUSCULAR | Status: AC
  Administered 2014-11-11: 4 mg via INTRAVENOUS
  Filled 2014-11-11 (×2): qty 1

## 2014-11-11 MED ORDER — POTASSIUM CHLORIDE CRYS ER 20 MEQ PO TBCR
40.0000 meq | EXTENDED_RELEASE_TABLET | Freq: Once | ORAL | Status: AC
  Administered 2014-11-11: 40 meq via ORAL
  Filled 2014-11-11 (×2): qty 2

## 2014-11-11 NOTE — ED Notes (Signed)
Pt reports left arm pain after taking a nap yesterday that has continued. Reports it has a heavy feeling. Intermittent chest pain. Reports as a stabbing feeling. Increase in shortness of breath.

## 2014-11-11 NOTE — ED Notes (Signed)
Patient states she has been off her BP medication for 6 months states about 1 weeks ago started having chest pain and 3 days ago started having  Left arm pain. Patient able to move all extremities. No swelling or deformity noted.

## 2014-11-11 NOTE — ED Provider Notes (Signed)
CSN: [id number]     Arrival date & time 11/11/14  1633 History   First MD Initiated Contact with Patient 11/11/14 1856     Chief Complaint  Patient presents with  . Arm Pain  . Chest Pain     (Consider location/radiation/quality/duration/timing/severity/associated sxs/prior Treatment) HPI  Patient presents to the emergency department with left shoulder pain that started yesterday.  The patient states that she has noticed some intermittent chest discomfort.  The pain in her chest lasts for 5-10 seconds at a time, sharp in nature.  Patient states that the shoulder pain is worse with palpation and movement.  Patient denies shortness of breath, nausea, vomiting, weakness, dizziness, headache, blurred vision, back pain, neck pain, fever, cough, abdominal pain, or syncope.  Patient states that she did not take any medications prior to arrival for symptoms Past Medical History  Diagnosis Date  . Hypertension    History reviewed. No pertinent past surgical history. No family history on file. History  Substance Use Topics  . Smoking status: Current Every Day Smoker  . Smokeless tobacco: Not on file  . Alcohol Use: Yes   OB History    No data available     Review of Systems  All other systems negative except as documented in the HPI. All pertinent positives and negatives as reviewed in the HPI.  Allergies  Neomycin sulfate  Home Medications   Prior to Admission medications   Medication Sig Start Date End Date Taking? Authorizing Provider  losartan-hydrochlorothiazide (HYZAAR) 100-12.5 MG per tablet Take 1 tablet by mouth daily.    Historical Provider, MD   BP 187/90 mmHg  Pulse 82  Temp(Src) 98.8 F (37.1 C) (Oral)  Resp 15  SpO2 100%  LMP 11/04/2014 (Approximate) Physical Exam  Constitutional: She is oriented to person, place, and time. She appears well-developed and well-nourished. No distress.  HENT:  Head: Normocephalic and atraumatic.  Mouth/Throat: Oropharynx is clear  and moist.  Eyes: Pupils are equal, round, and reactive to light.  Neck: Normal range of motion. Neck supple.  Cardiovascular: Normal rate, regular rhythm and normal heart sounds.  Exam reveals no gallop and no friction rub.   No murmur heard. Pulmonary/Chest: Effort normal and breath sounds normal. No respiratory distress.  Abdominal: Soft. Bowel sounds are normal.  Musculoskeletal:       Left shoulder: She exhibits decreased range of motion, tenderness and pain. She exhibits no swelling, no effusion, no deformity, no spasm, normal pulse and normal strength.  Neurological: She is alert and oriented to person, place, and time. She exhibits normal muscle tone. Coordination normal.  Skin: Skin is warm and dry. No rash noted. No erythema.  Nursing note and vitals reviewed.   ED Course  Procedures (including critical care time) Labs Review Labs Reviewed  COMPREHENSIVE METABOLIC PANEL - Abnormal; Notable for the following:    Potassium 3.0 (*)    Chloride 100 (*)    Creatinine, Ser 1.04 (*)    Calcium 8.8 (*)    Total Protein 8.3 (*)    ALT 12 (*)    All other components within normal limits  CBC - Abnormal; Notable for the following:    WBC 13.1 (*)    RBC 5.58 (*)    MCV 75.6 (*)    MCH 24.0 (*)    All other components within normal limits  BRAIN NATRIURETIC PEPTIDE  I-STAT TROPOININ, ED  I-STAT TROPOININ, ED  Rosezena Sensor, ED    Imaging Review Dg Chest 2  View  11/11/2014   CLINICAL DATA:  49 year old female with central chest pain and left arm pain. Shortness of breath for the past 3 days.  EXAM: CHEST  2 VIEW  COMPARISON:  Chest x-ray 04/10/2006.  FINDINGS: Lung volumes are normal. No consolidative airspace disease. No pleural effusions. No pneumothorax. No pulmonary nodule or mass noted. Pulmonary vasculature and the cardiomediastinal silhouette are within normal limits.  IMPRESSION: No radiographic evidence of acute cardiopulmonary disease.   Electronically Signed   By:  Trudie Reedaniel  Entrikin M.D.   On: 11/11/2014 17:35   Dg Shoulder Left  11/11/2014   CLINICAL DATA:  Left shoulder pain  EXAM: LEFT SHOULDER - 2+ VIEW  COMPARISON:  None.  FINDINGS: No acute fracture. No dislocation. Anatomic alignment at the glenohumeral joint. Unremarkable soft tissues.  IMPRESSION: No acute bony pathology.   Electronically Signed   By: Jolaine ClickArthur  Hoss M.D.   On: 11/11/2014 21:03     The patient has 2 sets of negative troponins and her chest pain is atypical as it only last for 5-10 seconds at a time shoulder pain is worse with movement and palpation over the deltoid region.  I feel that the patient has some inflammation, irritation of her shoulder.  Patient will be referred to orthopedics for further evaluation.  We will get symptomatic relief.  Told to use ice and heat on the area.  She also be given referral to primary care doctor  Charlestine NightChristopher Jenne Sellinger, PA-C 11/11/14 2206  Linwood DibblesJon Knapp, MD 11/11/14 2209

## 2014-11-11 NOTE — ED Notes (Signed)
Pt. Left with all belongings 

## 2014-11-11 NOTE — Discharge Instructions (Signed)
Return here as needed.  Follow-up with the orthopedist provided.  Also need to follow-up with the primary care doctor.  Use ice and heat on your shoulder

## 2014-12-01 ENCOUNTER — Ambulatory Visit: Payer: Self-pay | Admitting: Nurse Practitioner

## 2014-12-22 ENCOUNTER — Ambulatory Visit: Payer: Self-pay | Admitting: Nurse Practitioner

## 2015-01-21 ENCOUNTER — Emergency Department (HOSPITAL_COMMUNITY)
Admission: EM | Admit: 2015-01-21 | Discharge: 2015-01-21 | Disposition: A | Discharge status: Home / Self Care | Payer: No Typology Code available for payment source | Attending: Emergency Medicine | Admitting: Emergency Medicine

## 2015-01-21 ENCOUNTER — Encounter (HOSPITAL_COMMUNITY): Payer: Self-pay | Admitting: *Deleted

## 2015-01-21 DIAGNOSIS — I10 Essential (primary) hypertension: Secondary | ICD-10-CM | POA: Insufficient documentation

## 2015-01-21 DIAGNOSIS — R51 Headache: Secondary | ICD-10-CM | POA: Diagnosis not present

## 2015-01-21 DIAGNOSIS — J020 Streptococcal pharyngitis: Secondary | ICD-10-CM | POA: Diagnosis not present

## 2015-01-21 DIAGNOSIS — J029 Acute pharyngitis, unspecified: Secondary | ICD-10-CM | POA: Diagnosis present

## 2015-01-21 DIAGNOSIS — Z720 Tobacco use: Secondary | ICD-10-CM | POA: Diagnosis not present

## 2015-01-21 LAB — RAPID STREP SCREEN (MED CTR MEBANE ONLY): Streptococcus, Group A Screen (Direct): POSITIVE — AB

## 2015-01-21 MED ORDER — LOSARTAN POTASSIUM-HCTZ 100-12.5 MG PO TABS: 1.0000 | ORAL_TABLET | Freq: Every day | ORAL | Status: DC

## 2015-01-21 MED ORDER — ACETAMINOPHEN-CODEINE 120-12 MG/5ML PO SOLN
5.0000 mL | Freq: Once | ORAL | Status: AC
Start: 2015-01-21 — End: 2015-01-21
  Administered 2015-01-21: 5 mL via ORAL
  Filled 2015-01-21: qty 5

## 2015-01-21 MED ORDER — DEXAMETHASONE 1 MG/ML PO CONC
10.0000 mg | Freq: Once | ORAL | Status: AC
  Administered 2015-01-21: 10 mg via ORAL
  Filled 2015-01-21: qty 10

## 2015-01-21 MED ORDER — AMOXICILLIN 500 MG PO CAPS: 500.0000 mg | ORAL_CAPSULE | Freq: Three times a day (TID) | ORAL | Status: DC

## 2015-01-21 MED ORDER — HYDROCHLOROTHIAZIDE 25 MG PO TABS
25.0000 mg | ORAL_TABLET | Freq: Once | ORAL | Status: AC
  Administered 2015-01-21: 25 mg via ORAL
  Filled 2015-01-21: qty 1

## 2015-01-21 MED ORDER — HYDROCODONE-ACETAMINOPHEN 5-325 MG PO TABS: 1.0000 | ORAL_TABLET | Freq: Four times a day (QID) | ORAL | Status: DC | PRN

## 2015-01-21 NOTE — ED Notes (Signed)
Pt c/o sore throat for two days. Pt's granddaughter had streph throat and believes she caught it from her. Pt is unsure of fevers at home, denies n/v.

## 2015-01-21 NOTE — ED Provider Notes (Signed)
CSN: [id number]     Arrival date & time 01/21/15  1949 History  This chart was scribed for Marlon Pel, PA-C, working with Melene Plan, DO by Octavia Heir, ED Scribe. This patient was seen in room TR08C/TR08C and the patient's care was started at 8:27 PM.     Chief Complaint  Patient presents with  . Sore Throat      The history is provided by the patient. No language interpreter was used.   HPI Comments: Janice [name] is a 49 y.o. female who has a hx of HTN presents to the Emergency Department complaining of constant, gradual worsening sore throat onset 2 days ago. She has associated nasal congestion, fever, headache, voice changes and coughing. Pt notes she felt as if something was stuck in her throat and then waking up the next day with bilateral throat pain. Pt has been around her granddaughter who was just diagnosed with strep throat and started on Amoxicillin. Pt denies ear pain, neck pain, nausea, and vomiting.   Patients BP is elevated in triage at 177/110- she does not have PCP and is therefore out of her Hyzaar. She also has a low grade temp at 99.7 which she has not taken any Tylenol or Ibuprofen recently to treat.   Past Medical History  Diagnosis Date  . Hypertension    History reviewed. No pertinent past surgical history. History reviewed. No pertinent family history. Social History  Substance Use Topics  . Smoking status: Current Every Day Smoker  . Smokeless tobacco: None  . Alcohol Use: Yes   OB History    No data available     Review of Systems  Constitutional: Positive for fever.  HENT: Positive for congestion. Negative for ear pain.   Gastrointestinal: Negative for nausea and vomiting.  Musculoskeletal: Negative for neck pain.  Neurological: Positive for headaches.      Allergies  Neomycin sulfate  Home Medications   Prior to Admission medications   Medication Sig Start Date End Date Taking? Authorizing Provider  amoxicillin (AMOXIL) 500 MG  capsule Take 1 capsule (500 mg total) by mouth 3 (three) times daily. 01/21/15   Marlon Pel, PA-C  HYDROcodone-acetaminophen (NORCO/VICODIN) 5-325 MG per tablet Take 1 tablet by mouth every 6 (six) hours as needed for moderate pain. 01/21/15   Kale Rondeau Neva Seat, PA-C  ibuprofen (ADVIL,MOTRIN) 800 MG tablet Take 1 tablet (800 mg total) by mouth every 8 (eight) hours as needed. 11/11/14   Charlestine Night, PA-C  losartan-hydrochlorothiazide (HYZAAR) 100-12.5 MG per tablet Take 1 tablet by mouth daily. 01/21/15   Marlon Pel, PA-C   Triage vitals: BP 177/110 mmHg  Pulse 100  Temp(Src) 99.7 F (37.6 C) (Oral)  Resp 20  Ht 5' (1.524 m)  Wt 233 lb (105.688 kg)  BMI 45.50 kg/m2  SpO2 97%  LMP 01/04/2015 (Exact Date)  Physical Exam  Constitutional: She is oriented to person, place, and time. She appears well-developed and well-nourished. No distress.  HENT:  Head: Normocephalic and atraumatic.  Right Ear: Tympanic membrane, external ear and ear canal normal.  Left Ear: Tympanic membrane, external ear and ear canal normal.  Nose: Nose normal. No rhinorrhea. Right sinus exhibits no maxillary sinus tenderness and no frontal sinus tenderness. Left sinus exhibits no maxillary sinus tenderness and no frontal sinus tenderness.  Mouth/Throat: Uvula is midline and mucous membranes are normal. No trismus in the jaw. Normal dentition. No dental abscesses or uvula swelling. Oropharyngeal exudate and posterior oropharyngeal edema present. No posterior oropharyngeal erythema  or tonsillar abscesses.  Eyes: Conjunctivae are normal. Right eye exhibits no discharge. Left eye exhibits no discharge.  Neck: Trachea normal, normal range of motion and full passive range of motion without pain. Neck supple. No rigidity. Normal range of motion present. No Brudzinski's sign noted.  Flexion and extension of neck without pain or difficulty. Able to breath without difficulty in extension.  Cardiovascular: Normal rate and  regular rhythm.   Pulmonary/Chest: Effort normal and breath sounds normal. No stridor. No respiratory distress. She has no wheezes.  Abdominal: Soft. There is no tenderness.  No obvious evidence of splenomegaly. Non ttp.   Musculoskeletal: Normal range of motion.  Lymphadenopathy:       Head (right side): No preauricular and no posterior auricular adenopathy present.       Head (left side): No preauricular and no posterior auricular adenopathy present.    She has cervical adenopathy.  Neurological: She is alert and oriented to person, place, and time. Coordination normal.  Skin: Skin is warm and dry. No rash noted. She is not diaphoretic.  Psychiatric: She has a normal mood and affect. Her behavior is normal.  Nursing note and vitals reviewed.   ED Course  Procedures  DIAGNOSTIC STUDIES: Oxygen Saturation is 97% on RA, normal by my interpretation.  COORDINATION OF CARE:  8:29 PM Discussed treatment plan which includeswith pt at bedside and pt agreed to plan.   Labs Review Labs Reviewed  RAPID STREP SCREEN (NOT AT [facility]) - Abnormal; Notable for the following:    Streptococcus, Group A Screen (Direct) POSITIVE (*)    All other components within normal limits    Imaging Review No results found. I have personally reviewed and evaluated these images and lab results as part of my medical decision-making.   EKG Interpretation None      MDM   Final diagnoses:  Strep throat   Take antibiotic in completion. Continue to stay well-hydrated. Gargle warm salt water and spit it out. Continued to alternate between Tylenol and ibuprofen for pain. May consider over-the-counter Benadryl for additional relief. Followup with your primary care doctor in 5-7 days for recheck of ongoing symptoms that return to emergency department for emergent changing or worsening of symptoms.  Blood pressure medication refilled in the ED and a dose given in the ER, this did not improve her BP. She reports she  will take her BP meds now that she has them. She has not had headache, CP, N/V/D or shortness of breath.  Medications  dexamethasone (DECADRON) 1 MG/ML solution 10 mg (not administered)  acetaminophen-codeine 120-12 MG/5ML solution 5 mL (5 mLs Oral Given 01/21/15 2104)  hydrochlorothiazide (HYDRODIURIL) tablet 25 mg (25 mg Oral Given 01/21/15 2104)    49 y.o.Janice [name]'s evaluation in the Emergency Department is complete. It has been determined that no acute conditions requiring further emergency intervention are present at this time. The patient/guardian have been advised of the diagnosis and plan. We have discussed signs and symptoms that warrant return to the ED, such as changes or worsening in symptoms.  Vital signs are stable at discharge. Filed Vitals:   01/21/15 2020  BP: 177/110  Pulse: 100  Temp: 99.7 F (37.6 C)  Resp: 20    Patient/guardian has voiced understanding and agreed to follow-up with the PCP or specialist.  I personally performed the services described in this documentation, which was scribed in my presence. The recorded information has been reviewed and is accurate.   Marlon Pel, PA-C 01/21/15  2141  Melene Plan, DO 01/21/15 2327

## 2015-01-21 NOTE — Discharge Instructions (Signed)

## 2015-01-21 NOTE — ED Notes (Signed)
Informed Tiffany - PA of pt BP

## 2015-01-25 ENCOUNTER — Telehealth (HOSPITAL_COMMUNITY): Payer: Self-pay

## 2015-08-11 ENCOUNTER — Encounter (HOSPITAL_COMMUNITY): Payer: Self-pay | Admitting: Vascular Surgery

## 2015-08-11 ENCOUNTER — Emergency Department (HOSPITAL_COMMUNITY)
Admission: EM | Admit: 2015-08-11 | Discharge: 2015-08-11 | Disposition: A | Discharge status: Home / Self Care | Payer: No Typology Code available for payment source | Attending: Emergency Medicine | Admitting: Emergency Medicine

## 2015-08-11 DIAGNOSIS — J0110 Acute frontal sinusitis, unspecified: Secondary | ICD-10-CM

## 2015-08-11 DIAGNOSIS — Z792 Long term (current) use of antibiotics: Secondary | ICD-10-CM | POA: Insufficient documentation

## 2015-08-11 DIAGNOSIS — I10 Essential (primary) hypertension: Secondary | ICD-10-CM | POA: Insufficient documentation

## 2015-08-11 DIAGNOSIS — L409 Psoriasis, unspecified: Secondary | ICD-10-CM

## 2015-08-11 DIAGNOSIS — H9209 Otalgia, unspecified ear: Secondary | ICD-10-CM | POA: Insufficient documentation

## 2015-08-11 DIAGNOSIS — Z79899 Other long term (current) drug therapy: Secondary | ICD-10-CM | POA: Insufficient documentation

## 2015-08-11 DIAGNOSIS — F17210 Nicotine dependence, cigarettes, uncomplicated: Secondary | ICD-10-CM | POA: Insufficient documentation

## 2015-08-11 MED ORDER — TRIAMCINOLONE ACETONIDE 0.1 % EX CREA: 1.0000 "application " | TOPICAL_CREAM | Freq: Two times a day (BID) | CUTANEOUS | Status: DC

## 2015-08-11 MED ORDER — LOSARTAN POTASSIUM-HCTZ 100-12.5 MG PO TABS: 1.0000 | ORAL_TABLET | Freq: Every day | ORAL | Status: DC

## 2015-08-11 MED ORDER — AMOXICILLIN 500 MG PO CAPS: 500.0000 mg | ORAL_CAPSULE | Freq: Three times a day (TID) | ORAL | Status: DC

## 2015-08-11 NOTE — ED Provider Notes (Signed)
CSN: [id number]     Arrival date & time 08/11/15  1354 History   By signing my name below, I, Freida Busman, attest that this documentation has been prepared under the direction and in the presence of non-physician practitioner, Sharilyn Sites, PA-C. Electronically Signed: Freida Busman, Scribe. 08/11/2015. 2:50 PM.  Chief Complaint  Patient presents with  . Facial Pain   The history is provided by the patient. No language interpreter was used.     HPI Comments:  Janice [name] is a 50 y.o. female who presents to the Emergency Department complaining of moderate sinus pain and pressure x 2 weeks. She notes h/o similar pain with past sinus infection. Pt reports associated post nasal drip, sneezing, congestion, and ear pain She has been talking alkaseltzer and aleve without relief. She denies fever, cough and chills. No chest pain or SOB.  Pt also complains of rash to right leg x 1 month. She notes her granddaughter had a rash on her scalp which she was told was ring worm. No alleviating factors noted.  No new soaps, detergents, or other personal care products.  Has tried OTC cream without relief.   Past Medical History  Diagnosis Date  . Hypertension    History reviewed. No pertinent past surgical history. No family history on file. Social History  Substance Use Topics  . Smoking status: Current Every Day Smoker  . Smokeless tobacco: None  . Alcohol Use: Yes   OB History    No data available     Review of Systems  Constitutional: Negative for fever and chills.  HENT: Positive for congestion, ear pain, postnasal drip, sinus pressure (and pain) and sneezing.   Respiratory: Negative for cough.   Skin: Positive for rash.  All other systems reviewed and are negative.   Allergies  Neomycin sulfate  Home Medications   Prior to Admission medications   Medication Sig Start Date End Date Taking? Authorizing Provider  amoxicillin (AMOXIL) 500 MG capsule Take 1 capsule (500 mg total)  by mouth 3 (three) times daily. 01/21/15   Marlon Pel, PA-C  HYDROcodone-acetaminophen (NORCO/VICODIN) 5-325 MG per tablet Take 1 tablet by mouth every 6 (six) hours as needed for moderate pain. 01/21/15   Tiffany Neva Seat, PA-C  ibuprofen (ADVIL,MOTRIN) 800 MG tablet Take 1 tablet (800 mg total) by mouth every 8 (eight) hours as needed. 11/11/14   Charlestine Night, PA-C  losartan-hydrochlorothiazide (HYZAAR) 100-12.5 MG per tablet Take 1 tablet by mouth daily. 01/21/15   Tiffany Neva Seat, PA-C   BP 167/132 mmHg  Pulse 88  Temp(Src) 98.2 F (36.8 C) (Oral)  Resp 16  SpO2 98%   Physical Exam  Constitutional: She is oriented to person, place, and time. She appears well-developed and well-nourished.  HENT:  Head: Normocephalic and atraumatic.  Right Ear: Tympanic membrane and ear canal normal.  Left Ear: Tympanic membrane and ear canal normal.  Nose: Right sinus exhibits maxillary sinus tenderness and frontal sinus tenderness. Left sinus exhibits maxillary sinus tenderness and frontal sinus tenderness.  Mouth/Throat: Uvula is midline, oropharynx is clear and moist and mucous membranes are normal.  Eyes: Conjunctivae and EOM are normal. Pupils are equal, round, and reactive to light.  Neck: Normal range of motion.  Cardiovascular: Normal rate, regular rhythm and normal heart sounds.   Pulmonary/Chest: Effort normal and breath sounds normal.  Abdominal: Soft. Bowel sounds are normal.  Musculoskeletal: Normal range of motion.  Neurological: She is alert and oriented to person, place, and time.  Skin: Skin  is warm and dry.  Dry, scaly patch of skin of right lateral calf consistent with psoriasis  Psychiatric: She has a normal mood and affect.  Nursing note and vitals reviewed.   ED Course  Procedures    DIAGNOSTIC STUDIES:  Oxygen Saturation is 98% on RA, normal by my interpretation.    COORDINATION OF CARE:  2:50 PM Discussed treatment plan with pt at bedside and pt agreed to  plan.  Labs Review Labs Reviewed - No data to display  Imaging Review No results found. I have personally reviewed and evaluated these images and lab results as part of my medical decision-making.   EKG Interpretation None      MDM   Final diagnoses:  Acute frontal sinusitis, recurrence not specified  Psoriasis   50 year old female here with sinus congestion and drainage for the past 2 weeks. Patient is afebrile, nontoxic.  She does have noted maxillary and frontal sinus pressure, clinically suspect sinusitis. Patient also has a rash of her right lateral calf which is dry and scaling. This appears consistent with a patch of psoriasis. Patient will be started on amoxicillin and Kenalog ointment. Patient's blood pressure is high today. She reports she has not been taking medications for the past year as she has lost her insurance and does not have a primary care doctor. She is asymptomatic of this blood pressure, no chest pain, shortness of breath, dizziness, headache, or generalized weakness. Do not suspect any end organ damage. She states she tolerated Hyzaar she was taking previously very well so will restart this today. I have asked case management to meet with patient and they've arranged follow-up for her later this month in clinic.  Discussed plan with patient, he/she acknowledged understanding and agreed with plan of care.  Return precautions given for new or worsening symptoms.    I personally performed the services described in this documentation, which was scribed in my presence. The recorded information has been reviewed and is accurate.  Garlon HatchetLisa M Rionna Feltes, PA-C 08/11/15 1617  Rolland PorterMark James, MD 08/18/15 1501

## 2015-08-11 NOTE — Discharge Instructions (Signed)
Take the prescribed medication as directed.  Use the topical cream as directed. Follow-up with the wellness clinic as scheduled for you. Return to the ED for new or worsening symptoms.

## 2015-08-11 NOTE — ED Notes (Signed)
SW at bedside and provided patient with [facility] appointment and [facility] for [facility]

## 2015-08-11 NOTE — ED Notes (Signed)
Patient able to ambulate independently  

## 2015-08-11 NOTE — ED Notes (Signed)
Pt reports to the ED for eval of sinus congestion/drainage and sinus pain x several weeks. Denies any cough or N/V/D. Pt A&Ox4, resp e/u, and skin warm and dry.

## 2015-08-13 MED FILL — TRIAMCINOLONE 0.1% CREAM: 0.1 | 7 days supply | Qty: 30 | Fill #0

## 2015-08-13 MED FILL — LOSARTAN-HCTZ 100-12.5 MG T: 100-12.5 | 30 days supply | Qty: 30 | Fill #0

## 2015-08-13 MED FILL — AMOXICILLIN 500 MG CAPSULE: 500 | 10 days supply | Qty: 30 | Fill #0

## 2015-09-03 ENCOUNTER — Ambulatory Visit: Payer: No Typology Code available for payment source | Admitting: Family Medicine

## 2015-09-27 ENCOUNTER — Other Ambulatory Visit: Payer: Self-pay | Admitting: Family Medicine

## 2015-09-27 ENCOUNTER — Encounter: Payer: Self-pay | Admitting: Family Medicine

## 2015-09-27 ENCOUNTER — Ambulatory Visit (INDEPENDENT_AMBULATORY_CARE_PROVIDER_SITE_OTHER): Payer: Self-pay | Admitting: Family Medicine

## 2015-09-27 VITALS — BP 158/98 | HR 81 | Temp 98.3°F | Resp 16 | Ht 60.0 in | Wt 240.0 lb

## 2015-09-27 DIAGNOSIS — Z23 Encounter for immunization: Secondary | ICD-10-CM

## 2015-09-27 DIAGNOSIS — E669 Obesity, unspecified: Secondary | ICD-10-CM

## 2015-09-27 DIAGNOSIS — F17200 Nicotine dependence, unspecified, uncomplicated: Secondary | ICD-10-CM

## 2015-09-27 DIAGNOSIS — M79601 Pain in right arm: Secondary | ICD-10-CM

## 2015-09-27 DIAGNOSIS — I10 Essential (primary) hypertension: Secondary | ICD-10-CM

## 2015-09-27 LAB — POCT URINALYSIS DIP (DEVICE)
BILIRUBIN URINE: NEGATIVE
GLUCOSE, UA: NEGATIVE mg/dL
Hgb urine dipstick: NEGATIVE
Ketones, ur: NEGATIVE mg/dL
Leukocytes, UA: NEGATIVE
NITRITE: NEGATIVE
PH: 6.5 (ref 5.0–8.0)
Protein, ur: 30 mg/dL — AB
Specific Gravity, Urine: 1.025 (ref 1.005–1.030)
Urobilinogen, UA: 0.2 mg/dL (ref 0.0–1.0)

## 2015-09-27 LAB — TSH: TSH: 1.24 mIU/L

## 2015-09-27 MED ORDER — VARENICLINE TARTRATE 0.5 MG X 11 & 1 MG X 42 PO MISC: ORAL | Status: DC

## 2015-09-27 MED ORDER — HYDROCHLOROTHIAZIDE 25 MG PO TABS: 25.0000 mg | ORAL_TABLET | Freq: Every day | ORAL | Status: DC

## 2015-09-27 MED ORDER — LOSARTAN POTASSIUM 100 MG PO TABS: 100.0000 mg | ORAL_TABLET | Freq: Every day | ORAL | Status: DC

## 2015-09-27 MED FILL — ?HYDROCHLOROTHIAZIDE 25 MG: 25 MG | 30 days supply | Qty: 30 | Fill #0

## 2015-09-27 MED FILL — LOSARTAN POTASSIUM 100 MG T: 100 | 30 days supply | Qty: 30 | Fill #0

## 2015-09-27 NOTE — Patient Instructions (Addendum)
Diet for Metabolic Syndrome Metabolic syndrome is a disorder that includes at least three of these conditions:  Abdominal obesity.  Too much sugar in your blood.  High blood pressure.  Higher than normal amount of fat (lipids) in your blood.  Lower than normal level of "good" cholesterol (HDL). Following a healthy diet can help to keep metabolic syndrome under control. It can also help to prevent the development of conditions that are associated with metabolic syndrome, such as diabetes, heart disease, and stroke. Along with exercise, a healthy diet:  Helps to improve the way that the body uses insulin.  Promotes weight loss. A common goal for people with this condition is to lose at least 7 to 10 percent of their starting weight. WHAT DO I NEED TO KNOW ABOUT THIS DIET?  Use the glycemic index (GI) to plan your meals. The index tells you how quickly a food will raise your blood sugar. Choose foods that have low GI values. These foods take a longer time to raise blood sugar.  Keep track of how many calories you take in. Eating the right amount of calories will help your achieve a healthy weight.  You may want to follow a Mediterranean diet. This diet includes lots of vegetables, lean meats or fish, whole grains, fruits, and healthy oils and fats. WHAT FOODS CAN I EAT? Grains Stone-ground whole wheat. Pumpernickel bread. Whole-grain bread, crackers, tortillas, cereal, and pasta. Unsweetened oatmeal.Bulgur.Barley.Quinoa.Brown rice or wild rice. Vegetables Lettuce. Spinach. Peas. Beets. Cauliflower. Cabbage. Broccoli. Carrots. Tomatoes. Squash. Eggplant. Herbs. Peppers. Onions. Cucumbers. Brussels sprouts. Sweet potatoes. Yams. Beans. Lentils. Fruits Berries. Apples. Oranges. Grapes. Mango. Pomegranate. Kiwi. Cherries. Meats and Other Protein Sources Seafood and shellfish. Lean meats.Poultry. Tofu. Dairy Low-fat or fat-free dairy products, such as milk, yogurt, and  cheese. Beverages Water. Low-fat milk. Milk alternatives, like soy milk or almond milk. Real fruit juice. Condiments Low-sugar or sugar-free ketchup, barbecue sauce, and mayonnaise. Mustard. Relish. Fats and Oils Avocado. Canola or olive oil. Nuts and nut butters.Seeds. The items listed above may not be a complete list of recommended foods or beverages. Contact your dietitian for more options.  WHAT FOODS ARE NOT RECOMMENDED? Red meat. Palm oil and coconut oil. Processed foods. Fried foods. Alcohol. Sweetened drinks, such as iced tea and soda. Sweets. Salty foods. The items listed above may not be a complete list of foods and beverages to avoid. Contact your dietitian for more information.   This information is not intended to replace advice given to you by your health care provider. Make sure you discuss any questions you have with your health care provider.   Document Released: 10/06/2014 Document Reviewed: 10/06/2014 Elsevier Interactive Patient Education 2016 Elsevier Inc. Tobacco Use Disorder Tobacco use disorder (TUD) is a mental disorder. It is the long-term use of tobacco in spite of related health problems or difficulty with normal life activities. Tobacco is most commonly smoked as cigarettes and less commonly as cigars or pipes. Smokeless chewing tobacco and snuff are also popular. People with TUD get a feeling of extreme pleasure (euphoria) from using tobacco and have a desire to use it again and again. Repeated use of tobacco can cause problems. The addictive effects of tobacco are due mainly tothe ingredient nicotine. Nicotine also causes a rush of adrenaline (epinephrine) in the body. This leads to increased blood pressure, heart rate, and breathing rate. These changes may cause problems for people with high blood pressure, weak hearts, or lung disease. High doses of nicotine in children  and pets can lead to seizures and death.  Tobacco contains a number of other unsafe  chemicals. These chemicals are especially harmful when inhaled as smoke and can damage almost every organ in the body. Smokers live shorter lives than nonsmokers and are at risk of dying from a number of diseases and cancers. Tobacco smoke can also cause health problems for nonsmokers (due to inhaling secondhand smoke). Smoking is also a fire hazard.  TUD usually starts in the late teenage years and is most common in young adults between the ages of 6818 and 25 years. People who start smoking earlier in life are more likely to continue smoking as adults. TUD is somewhat more common in men than women. People with TUD are at higher risk for using alcohol and other drugs of abuse. RISK FACTORS Risk factors for TUD include:   Having family members with the disorder.  Being around people who use tobacco.  Having an existing mental health issue such as schizophrenia, depression, bipolar disorder, ADHD, or posttraumatic stress disorder (PTSD). SIGNS AND SYMPTOMS  People with tobacco use disorder have two or more of the following signs and symptoms within 12 months:   Use of more tobacco over a longer period than intended.   Not able to cut down or control tobacco use.   A lot of time spent obtaining or using tobacco.   Strong desire or urge to use tobacco (craving). Cravings may last for 6 months or longer after quitting.  Use of tobacco even when use leads to major problems at work, school, or home.   Use of tobacco even when use leads to relationship problems.   Giving up or cutting down on important life activities because of tobacco use.   Repeatedly using tobacco in situations where it puts you or others in physical danger, like smoking in bed.   Use of tobacco even when it is known that a physical or mental problem is likely related to tobacco use.   Physical problems are numerous and may include chronic bronchitis, emphysema, lung and other cancers, gum disease, high blood  pressure, heart disease, and stroke.   Mental problems caused by tobacco may include difficulty sleeping and anxiety.  Need to use greater amounts of tobacco to get the same effect. This means you have developed a tolerance.   Withdrawal symptoms as a result of stopping or rapidly cutting back use. These symptoms may last a month or more after quitting and include the following:   Depressed, anxious, or irritable mood.   Difficulty concentrating.   Increased appetite.  Restlessness or trouble sleeping.   Use of tobacco to avoid withdrawal symptoms. DIAGNOSIS  Tobacco use disorder is diagnosed by your health care provider. A diagnosis may be made by:  Your health care provider asking questions about your tobacco use and any problems it may be causing.  A physical exam.  Lab tests.  You may be referred to a mental health professional or addiction specialist. The severity of tobacco use disorder depends on the number of signs and symptoms you have:   Mild--Two or three symptoms.  Moderate--Four or five symptoms.   Severe--Six or more symptoms.  TREATMENT  Many people with tobacco use disorder are unable to quit on their own and need help. Treatment options include the following:  Nicotine replacement therapy (NRT). NRT provides nicotine without the other harmful chemicals in tobacco. NRT gradually lowers the dosage of nicotine in the body and reduces withdrawal symptoms. NRT is  available in over-the-counter forms (gum, lozenges, and skin patches) as well as prescription forms (mouth inhaler and nasal spray).  Medicines.This may include:  Antidepressant medicine that may reduce nicotine cravings.  A medicine that acts on nicotine receptors in the brain to reduce cravings and withdrawal symptoms. It may also block the effects of tobacco in people with TUD who relapse.  Counseling or talk therapy. A form of talk therapy called behavioral therapy is commonly used to  treat people with TUD. Behavioral therapy looks at triggers for tobacco use, how to avoid them, and how to cope with cravings. It is most effective in person or by phone but is also available in self-help forms (books and Internet websites).  Support groups. These provide emotional support, advice, and guidance for quitting tobacco. The most effective treatment for TUD is usually a combination of medicine, talk therapy, and support groups. HOME CARE INSTRUCTIONS  Keep all follow-up visits as directed by your health care provider. This is important.  Take medicines only as directed by your health care provider.  Check with your health care provider before starting new prescription or over-the-counter medicines. SEEK MEDICAL CARE IF:  You are not able to take your medicines as prescribed.  Treatment is not helping your TUD and your symptoms get worse. SEEK IMMEDIATE MEDICAL CARE IF:  You have serious thoughts about hurting yourself or others.  You have trouble breathing, chest pain, sudden weakness, or sudden numbness in part of your body.   This information is not intended to replace advice given to you by your health care provider. Make sure you discuss any questions you have with your health care provider.   Document Released: 01/26/2004 Document Revised: 06/12/2014 Document Reviewed: 07/18/2013 Elsevier Interactive Patient Education [employer]

## 2015-09-27 NOTE — Progress Notes (Signed)
Subjective:    Patient ID: Janice [name], female    DOB: 06/07/1965, 50 y.o.   MRN: [id number]  HPI Janice [name], a 50 year old female with a history of hypertension and obesity presents to establish care. She says that she was a patient of [facility], but was lost to follow up due to a change in providers.  Patient says that she has been taking blood pressure medication. She is not exercising and is not adherent to low salt diet. She does not check blood pressures at home.   Patient denies chest pain, chest pressure/discomfort, exertional chest pressure/discomfort, fatigue, irregular heart beat, lower extremity edema, orthopnea, palpitations and tachypnea.  Cardiovascular risk factors include: obesity (BMI >= 30 kg/m2), sedentary lifestyle and smoking/ tobacco exposure.    Patient also  complains of right arm joint pain that has been present for greater than 1 year. Pain is located in the right elbow(s), is described as aching, and is intermittent .  Associated symptoms include: decreased range of motion, edema and tenderness.  The patient has tried NSAIDs for pain, with moderate relief.  She denies previous injury, fever, crepitus, or warmth.   Past Medical History  Diagnosis Date  . Hypertension    Social History   Social History  . Marital Status: Single    Spouse Name: N/A  . Number of Children: N/A  . Years of Education: N/A   Occupational History  . Not on file.   Social History Main Topics  . Smoking status: Current Every Day Smoker -- 0.50 packs/day    Types: Cigarettes  . Smokeless tobacco: Not on file  . Alcohol Use: Yes     Comment: occ  . Drug Use: No  . Sexual Activity: Not on file   Other Topics Concern  . Not on file   Social History Narrative   Past Surgical History  Procedure Laterality Date  . Cesarean section      x2   Review of Systems  Constitutional: Negative.  Negative for unexpected weight change.  HENT: Negative.   Negative for congestion.   Eyes: Negative.  Negative for visual disturbance.  Respiratory: Negative.  Negative for shortness of breath.   Cardiovascular: Negative.  Negative for chest pain, palpitations and leg swelling.  Gastrointestinal: Negative.   Endocrine: Negative.  Negative for cold intolerance, heat intolerance, polydipsia, polyphagia and polyuria.  Genitourinary: Negative.   Musculoskeletal: Positive for arthralgias (right arm pain).  Skin: Negative.   Allergic/Immunologic: Negative.  Negative for immunocompromised state.  Neurological: Negative.  Negative for weakness.  Hematological: Negative.   Psychiatric/Behavioral: Negative.        Objective:   Physical Exam  Constitutional: She is oriented to person, place, and time. She appears well-developed and well-nourished.  HENT:  Head: Normocephalic and atraumatic.  Right Ear: External ear normal.  Left Ear: External ear normal.  Nose: Nose normal.  Mouth/Throat: Oropharynx is clear and moist.  Eyes: Conjunctivae and EOM are normal. Pupils are equal, round, and reactive to light.  Neck: Normal range of motion. Neck supple.  Cardiovascular: Normal rate, regular rhythm, normal heart sounds and intact distal pulses.   Pulmonary/Chest: Effort normal and breath sounds normal.  Abdominal: Soft. Bowel sounds are normal.  Musculoskeletal:       Right elbow: She exhibits decreased range of motion. She exhibits no swelling and no laceration.  Neurological: She is alert and oriented to person, place, and time. She has normal reflexes.  Skin: Skin is warm and dry.  Psychiatric: She has a normal mood and affect. Her behavior is normal. Judgment and thought content normal.    BP 158/98 mmHg  Pulse 81  Temp(Src) 98.3 F (36.8 C) (Oral)  Resp 16  Ht 5' (1.524 [name])  Wt 240 lb (108.863 kg)  BMI 46.87 kg/m2  SpO2 97%  LMP 09/04/2015 Assessment & Plan:  1. Essential hypertension Blood pressure is above goal on current medication  regimen. Will increase hydrochlorothiazide. Janice [name] will follow up in office in 1 month.  - EKG 12-Lead - POCT urinalysis dipstick - COMPLETE METABOLIC PANEL WITH GFR - CBC with Differential - losartan (COZAAR) 100 MG tablet; Take 1 tablet (100 mg total) by mouth daily.  Dispense: 30 tablet; Refill: 2 - hydrochlorothiazide (HYDRODIURIL) 25 MG tablet; Take 1 tablet (25 mg total) by mouth daily.  Dispense: 30 tablet; Refill: 2 - POCT urinalysis dip (device)  2. Right arm pain Janice [name] has chronic right arm pain. She says that  - Sedimentation Rate  3. TOBACCO DEPENDENCE Smoking cessation instruction/counseling given:  counseled patient on the dangers of tobacco use, advised patient to stop smoking, and reviewed strategies to maximize success - varenicline (CHANTIX STARTING MONTH PAK) 0.5 MG X 11 & 1 MG X 42 tablet; Take one 0.5 mg tablet by mouth once daily for 3 days, then increase to one 0.5 mg tablet twice daily for 4 days, then increase to one 1 mg tablet twice daily.  Dispense: 53 tablet; Refill: 0  4. Immunization due - Pneumococcal polysaccharide vaccine 23-valent greater than or equal to 2yo subcutaneous/IM  5. Obesity Recommend a lowfat, low carbohydrate diet divided over 5-6 small meals, increase water intake to 6-8 glasses, and 150 minutes per week of cardiovascular exercise.   - Hemoglobin A1c - Lipid Panel - TSH   Routine Health Maintenance:  Recommend that patient schedule pap smear Recommend screening mammogram    RTC: 1 month for hypertension  Janice Stenerson M, FNP

## 2015-09-28 LAB — LIPID PANEL
CHOLESTEROL: 141 mg/dL (ref 125–200)
HDL: 37 mg/dL — ABNORMAL LOW (ref >=46)
LDL Cholesterol: 82 mg/dL (ref <130)
Total CHOL/HDL Ratio: 3.8 Ratio (ref <=5.0)
Triglycerides: 108 mg/dL (ref <150)
VLDL: 22 mg/dL (ref <30)

## 2015-09-28 LAB — CBC WITH DIFFERENTIAL/PLATELET
BASOS ABS: 0 {cells}/uL (ref 0–200)
Basophils Relative: 0 %
EOS PCT: 1 %
Eosinophils Absolute: 135 cells/uL (ref 15–500)
HCT: 43.3 % (ref 35.0–45.0)
Hemoglobin: 13.1 g/dL (ref 11.7–15.5)
LYMPHS PCT: 22 %
Lymphs Abs: 2970 cells/uL (ref 850–3900)
MCH: 23.9 pg — ABNORMAL LOW (ref 27.0–33.0)
MCHC: 30.3 g/dL — ABNORMAL LOW (ref 32.0–36.0)
MCV: 79 fL — ABNORMAL LOW (ref 80.0–100.0)
MONOS PCT: 4 %
MPV: 8.4 fL (ref 7.5–12.5)
Monocytes Absolute: 540 cells/uL (ref 200–950)
NEUTROS ABS: 9855 {cells}/uL — AB (ref 1500–7800)
Neutrophils Relative %: 73 %
Platelets: 289 10*3/uL (ref 140–400)
RBC: 5.48 MIL/uL — ABNORMAL HIGH (ref 3.80–5.10)
RDW: 16 % — AB (ref 11.0–15.0)
WBC: 13.5 10*3/uL — ABNORMAL HIGH (ref 3.8–10.8)

## 2015-09-28 LAB — COMPLETE METABOLIC PANEL WITH GFR
ALBUMIN: 3.6 g/dL (ref 3.6–5.1)
ALT: 8 U/L (ref 6–29)
AST: 12 U/L (ref 10–35)
Alkaline Phosphatase: 97 U/L (ref 33–130)
BUN: 12 mg/dL (ref 7–25)
CO2: 30 mmol/L (ref 20–31)
Calcium: 8.9 mg/dL (ref 8.6–10.4)
Chloride: 100 mmol/L (ref 98–110)
Creat: 1.05 mg/dL (ref 0.50–1.05)
GFR, Est African American: 72 mL/min (ref >=60)
GFR, Est Non African American: 62 mL/min (ref >=60)
GLUCOSE: 100 mg/dL — AB (ref 65–99)
Potassium: 3.3 mmol/L — ABNORMAL LOW (ref 3.5–5.3)
SODIUM: 141 mmol/L (ref 135–146)
TOTAL PROTEIN: 7.3 g/dL (ref 6.1–8.1)
Total Bilirubin: 0.7 mg/dL (ref 0.2–1.2)

## 2015-09-28 LAB — HEMOGLOBIN A1C
HEMOGLOBIN A1C: 5.9 % — AB (ref <5.7)
MEAN PLASMA GLUCOSE: 123 mg/dL

## 2015-09-28 LAB — C-REACTIVE PROTEIN: CRP: 1.1 mg/dL — ABNORMAL HIGH (ref <0.60)

## 2015-09-28 LAB — VITAMIN D 25 HYDROXY (VIT D DEFICIENCY, FRACTURES): VIT D 25 HYDROXY: 8 ng/mL — AB (ref 30–100)

## 2015-09-28 LAB — SEDIMENTATION RATE: Sed Rate: 27 mm/hr — ABNORMAL HIGH (ref 0–20)

## 2015-09-29 ENCOUNTER — Other Ambulatory Visit: Payer: Self-pay | Admitting: Family Medicine

## 2015-09-29 DIAGNOSIS — E559 Vitamin D deficiency, unspecified: Secondary | ICD-10-CM

## 2015-09-29 HISTORY — DX: Vitamin D deficiency, unspecified: E55.9

## 2015-09-29 MED ORDER — ERGOCALCIFEROL 1.25 MG (50000 UT) PO CAPS: 50000.0000 [IU] | ORAL_CAPSULE | ORAL | Status: DC

## 2015-09-29 MED FILL — VIT D2 1.25 MG (50,000 UNIT: 1.25 MG | 84 days supply | Qty: 12 | Fill #0

## 2015-11-11 ENCOUNTER — Ambulatory Visit: Payer: No Typology Code available for payment source | Admitting: Family Medicine

## 2015-12-08 MED FILL — HYDROCHLOROTHIAZIDE 25 MG T: 25 | 30 days supply | Qty: 30 | Fill #1

## 2015-12-08 MED FILL — ?LOSARTAN POTASSIUM 100 MG: 100 | 30 days supply | Qty: 30 | Fill #1

## 2015-12-14 ENCOUNTER — Ambulatory Visit: Payer: Self-pay | Admitting: Family Medicine

## 2016-01-24 ENCOUNTER — Encounter: Payer: Self-pay | Admitting: Family Medicine

## 2016-01-24 ENCOUNTER — Ambulatory Visit (INDEPENDENT_AMBULATORY_CARE_PROVIDER_SITE_OTHER): Payer: Self-pay | Admitting: Family Medicine

## 2016-01-24 VITALS — BP 203/134 | HR 84 | Temp 98.5°F | Ht 60.0 in | Wt 236.0 lb

## 2016-01-24 DIAGNOSIS — E559 Vitamin D deficiency, unspecified: Secondary | ICD-10-CM

## 2016-01-24 DIAGNOSIS — I10 Essential (primary) hypertension: Secondary | ICD-10-CM

## 2016-01-24 DIAGNOSIS — Z0000 Encounter for general adult medical examination without abnormal findings: Secondary | ICD-10-CM

## 2016-01-24 LAB — CBC WITH DIFFERENTIAL/PLATELET
BASOS PCT: 0 %
Basophils Absolute: 0 cells/uL (ref 0–200)
EOS ABS: 123 {cells}/uL (ref 15–500)
Eosinophils Relative: 1 %
HCT: 40.8 % (ref 35.0–45.0)
Hemoglobin: 12.7 g/dL (ref 11.7–15.5)
LYMPHS ABS: 2214 {cells}/uL (ref 850–3900)
LYMPHS PCT: 18 %
MCH: 24 pg — ABNORMAL LOW (ref 27.0–33.0)
MCHC: 31.1 g/dL — ABNORMAL LOW (ref 32.0–36.0)
MCV: 77 fL — ABNORMAL LOW (ref 80.0–100.0)
MPV: 9 fL (ref 7.5–12.5)
Monocytes Absolute: 492 cells/uL (ref 200–950)
Monocytes Relative: 4 %
Neutro Abs: 9471 cells/uL — ABNORMAL HIGH (ref 1500–7800)
Neutrophils Relative %: 77 %
Platelets: 284 10*3/uL (ref 140–400)
RBC: 5.3 MIL/uL — AB (ref 3.80–5.10)
RDW: 15.2 % — AB (ref 11.0–15.0)
WBC: 12.3 10*3/uL — AB (ref 3.8–10.8)

## 2016-01-24 LAB — COMPLETE METABOLIC PANEL WITH GFR
ALT: 7 U/L (ref 6–29)
AST: 11 U/L (ref 10–35)
Albumin: 3.5 g/dL — ABNORMAL LOW (ref 3.6–5.1)
Alkaline Phosphatase: 85 U/L (ref 33–130)
BUN: 13 mg/dL (ref 7–25)
CALCIUM: 8.6 mg/dL (ref 8.6–10.4)
CHLORIDE: 101 mmol/L (ref 98–110)
CO2: 25 mmol/L (ref 20–31)
Creat: 1.12 mg/dL — ABNORMAL HIGH (ref 0.50–1.05)
GFR, EST NON AFRICAN AMERICAN: 57 mL/min — AB (ref >=60)
GFR, Est African American: 66 mL/min (ref >=60)
Glucose, Bld: 104 mg/dL — ABNORMAL HIGH (ref 65–99)
POTASSIUM: 3.4 mmol/L — AB (ref 3.5–5.3)
Sodium: 138 mmol/L (ref 135–146)
Total Bilirubin: 0.4 mg/dL (ref 0.2–1.2)
Total Protein: 7.1 g/dL (ref 6.1–8.1)

## 2016-01-24 MED ORDER — CLONIDINE HCL 0.1 MG PO TABS
0.2000 mg | ORAL_TABLET | Freq: Once | ORAL | Status: AC
  Administered 2016-01-24: 0.2 mg via ORAL

## 2016-01-24 MED ORDER — AMLODIPINE BESYLATE 10 MG PO TABS: 10.0000 mg | ORAL_TABLET | Freq: Every day | ORAL | 3 refills | Status: DC

## 2016-01-24 MED ORDER — LOSARTAN POTASSIUM 100 MG PO TABS: 100.0000 mg | ORAL_TABLET | Freq: Every day | ORAL | 2 refills | Status: DC

## 2016-01-24 MED ORDER — HYDROCHLOROTHIAZIDE 25 MG PO TABS: 25.0000 mg | ORAL_TABLET | Freq: Every day | ORAL | 2 refills | Status: DC

## 2016-01-24 MED FILL — ?LOSARTAN POTASSIUM 100 MG: 100 | 30 days supply | Qty: 30 | Fill #0

## 2016-01-24 MED FILL — ?AMLODIPINE BESYLATE 10 MG: 10 | 30 days supply | Qty: 30 | Fill #0

## 2016-01-24 MED FILL — HYDROCHLOROTHIAZIDE 25 MG T: 25 | 30 days supply | Qty: 30 | Fill #0

## 2016-01-24 NOTE — Progress Notes (Signed)
Janice [name], is a 50 y.o. female  WUJ:[id number]  [id number]  DOB - 04/28/1966  CC:  Chief Complaint  Patient presents with  . Follow-up    has rash and irritation on right leg, would like to know if she should continue Vit D, feels blue due to funerals lately, declines flu vaccine       HPI: Janice [name] is a 50 y.o. female here for follow-up of hypertension. She had her bp medications about 30 minutes before arrival. Her BP was 203/124. Repeat 15 minutes later 180/   . She has a history of Vit. D def. And is on replacement.   Health Maintenance: She declines flu shot and Tdap today.  She is in need of colon cancer screening, mammogram and PAP. Will refer [facility] to [facility] for [facility] and PAP. Will do stool cards for colon cancer screening.  She reports smoking about 1/2 pack of cigarettes daily and is not ready to quit.   Allergies  Allergen Reactions  . Neomycin Sulfate Swelling, Rash and Other (See Comments)    Pain in ear, swelling and redness   Past Medical History:  Diagnosis Date  . Hypertension    Current Outpatient Prescriptions on File Prior to Visit  Medication Sig Dispense Refill  . ergocalciferol (VITAMIN D2) 50000 units capsule Take 1 capsule (50,000 Units total) by mouth once a week. 12 capsule 5  . ibuprofen (ADVIL,MOTRIN) 800 MG tablet Take 1 tablet (800 mg total) by mouth every 8 (eight) hours as needed. (Patient not taking: Reported on 09/27/2015) 21 tablet 0  . varenicline (CHANTIX STARTING MONTH PAK) 0.5 MG X 11 & 1 MG X 42 tablet Take one 0.5 mg tablet by mouth once daily for 3 days, then increase to one 0.5 mg tablet twice daily for 4 days, then increase to one 1 mg tablet twice daily. (Patient not taking: Reported on 01/24/2016) 53 tablet 0   No current facility-administered medications on file prior to visit.    Family History  Problem Relation Age of Onset  . Hypertension Maternal Grandmother    Social History   Social History   . Marital status: Single    Spouse name: N/A  . Number of children: N/A  . Years of education: N/A   Occupational History  . Not on file.   Social History Main Topics  . Smoking status: Current Every Day Smoker    Packs/day: 0.50    Types: Cigarettes  . Smokeless tobacco: Not on file  . Alcohol use Yes     Comment: occ  . Drug use: No  . Sexual activity: Not on file   Other Topics Concern  . Not on file   Social History Narrative  . No narrative on file    Review of Systems: Constitutional: Negative for fever, chills, appetite change, weight loss,  Fatigue. Skin: Negative for rashes or lesions of concern. HENT: Negative for ear pain, ear discharge.nose bleeds Eyes: Negative for pain, discharge, redness, itching and visual disturbance. Neck: Negative for pain, stiffness Respiratory: Negative for cough, shortness of breath,   Cardiovascular: Negative for chest pain, palpitations and leg swelling. Gastrointestinal: Negative for abdominal pain, nausea, vomiting, diarrhea, constipations Genitourinary: Negative for dysuria, urgency, frequency, hematuria,  Musculoskeletal: Negative for back pain, joint pain, joint  swelling, and gait problem.Negative for weakness. Neurological: Negative for dizziness, tremors, seizures, syncope,   light-headedness, numbness and headaches.  Hematological: Negative for easy bruising or bleeding Psychiatric/Behavioral: Negative for depression, anxiety, decreased  concentration, confusion   Objective:   Vitals:   01/24/16 1124  BP: (!) 203/134  Pulse: 84  Temp: 98.5 F (36.9 C)    Physical Exam: Constitutional: Patient appears well-developed and well-nourished. No distress. HENT: Normocephalic, atraumatic, External right and left ear normal. Oropharynx is clear and moist.  Eyes: Conjunctivae and EOM are normal. PERRLA, no scleral icterus. Neck: Normal ROM. Neck supple. No lymphadenopathy, No thyromegaly. CVS: RRR, S1/S2 +, no murmurs, no  gallops, no rubs Pulmonary: Effort and breath sounds normal, no stridor, rhonchi, wheezes, rales.  Abdominal: Soft. Normoactive BS,, no distension, tenderness, rebound or guarding.  Musculoskeletal: Normal range of motion. No edema and no tenderness.  Neuro: Alert.Normal muscle tone coordination. Non-focal Skin: Skin is warm and dry. No rash noted. Not diaphoretic. No erythema. No pallor. Psychiatric: Normal mood and affect. Behavior, judgment, thought content normal.  Lab Results  Component Value Date   WBC 13.5 (H) 09/27/2015   HGB 13.1 09/27/2015   HCT 43.3 09/27/2015   MCV 79.0 (L) 09/27/2015   PLT 289 09/27/2015   Lab Results  Component Value Date   CREATININE 1.05 09/27/2015   BUN 12 09/27/2015   NA 141 09/27/2015   K 3.3 (L) 09/27/2015   CL 100 09/27/2015   CO2 30 09/27/2015    Lab Results  Component Value Date   HGBA1C 5.9 (H) 09/27/2015   Lipid Panel     Component Value Date/Time   CHOL 141 09/27/2015 1118   TRIG 108 09/27/2015 1118   HDL 37 (L) 09/27/2015 1118   CHOLHDL 3.8 09/27/2015 1118   VLDL 22 09/27/2015 1118   LDLCALC 82 09/27/2015 1118       Assessment and plan:   1. Essential hypertension  - COMPLETE METABOLIC PANEL WITH GFR - CBC with Differential - hydrochlorothiazide (HYDRODIURIL) 25 MG tablet; Take 1 tablet (25 mg total) by mouth daily.  Dispense: 30 tablet; Refill: 2 - losartan (COZAAR) 100 MG tablet; Take 1 tablet (100 mg total) by mouth daily.  Dispense: 30 tablet; Refill: 2 - cloNIDine (CATAPRES) tablet 0.2 mg; Take 2 tablets (0.2 mg total) by mouth once. -add amlodipine 10 md daily  2. Vitamin D deficiency - Vitamin D 1,25 dihydroxy  3. Health Maintenance -stool cards -information re [facility] mammogram and PAP.   One month for BP check by nurse.  The patient was given clear instructions to go to ER or return to medical center if symptoms don't improve, worsen or new problems develop. The patient verbalized  understanding.    Henrietta HooverLinda C Mcgregor Tinnon FNP  01/24/2016, 11:52 AM

## 2016-01-24 NOTE — Patient Instructions (Signed)
Call [phone] to set up mammogram and [facility] smear at [facility].

## 2016-01-26 LAB — VITAMIN D 1,25 DIHYDROXY
VITAMIN D3 1, 25 (OH): 8 pg/mL
Vitamin D 1, 25 (OH)2 Total: 32 pg/mL (ref 18–72)
Vitamin D2 1, 25 (OH)2: 24 pg/mL

## 2016-01-28 ENCOUNTER — Telehealth: Payer: Self-pay

## 2016-01-28 NOTE — Telephone Encounter (Signed)
Bonita QuinLinda, were you going to prescribe a cream? Please advise. Thanks! LOV 01/24/2016

## 2016-01-31 ENCOUNTER — Encounter: Payer: Self-pay | Admitting: Family Medicine

## 2016-01-31 NOTE — Telephone Encounter (Signed)
She needs to come in for nurse visit for BP check. I will look at her leg when she comes to see if she needs an oral antibiotic.

## 2016-01-31 NOTE — Telephone Encounter (Signed)
Sent letter to call us regarding her questions since we can not reach her by phone.

## 2016-01-31 NOTE — Telephone Encounter (Signed)
Unable to contact patient by phone, no VM set up.  

## 2016-01-31 NOTE — Progress Notes (Signed)
Letter sent to call us since we can not reach her by phone.

## 2016-01-31 NOTE — Telephone Encounter (Signed)
Unable to contact patient by phone, no VM set up.

## 2016-02-24 ENCOUNTER — Ambulatory Visit: Payer: Self-pay

## 2016-03-07 ENCOUNTER — Ambulatory Visit: Payer: Self-pay

## 2016-07-10 ENCOUNTER — Emergency Department (HOSPITAL_COMMUNITY)
Admission: EM | Admit: 2016-07-10 | Discharge: 2016-07-10 | Disposition: A | Discharge status: Home / Self Care | Payer: Self-pay | Attending: Physician Assistant | Admitting: Physician Assistant

## 2016-07-10 ENCOUNTER — Encounter (HOSPITAL_COMMUNITY): Payer: Self-pay | Admitting: Emergency Medicine

## 2016-07-10 DIAGNOSIS — I10 Essential (primary) hypertension: Secondary | ICD-10-CM | POA: Insufficient documentation

## 2016-07-10 DIAGNOSIS — F17210 Nicotine dependence, cigarettes, uncomplicated: Secondary | ICD-10-CM | POA: Insufficient documentation

## 2016-07-10 DIAGNOSIS — R21 Rash and other nonspecific skin eruption: Secondary | ICD-10-CM | POA: Insufficient documentation

## 2016-07-10 MED ORDER — HYDROCORTISONE 1 % EX CREA: TOPICAL_CREAM | CUTANEOUS | 0 refills | Status: DC

## 2016-07-10 MED ORDER — AMLODIPINE BESYLATE 10 MG PO TABS
10.0000 mg | ORAL_TABLET | Freq: Every day | ORAL | 0 refills | Status: DC
Start: 2016-07-10 — End: 2016-07-24

## 2016-07-10 MED ORDER — HYDROCHLOROTHIAZIDE 25 MG PO TABS: 25.0000 mg | ORAL_TABLET | Freq: Every day | ORAL | 0 refills | Status: DC

## 2016-07-10 NOTE — Discharge Instructions (Signed)
You can try this cream to help with the rash. You need to follow up with her primary care physician and see a dermatologist for further care about this rash. Additionally need to take her blood pressure medications as prescribed. We've provided with a month for two of the meds.  To find a primary care or specialty doctor please call [phone] or [phone] to access "[facility] Find a Doctor Service."  You may also go on the [facility] website at [email or web address]  There are also multiple [facility] and [facility] practices throughout the [place] that are frequently accepting new patients. You may find a clinic that is close to your home and contact them.  [facility] -  [address] [place] [id number] [phone]  [facility] in [place] (also locations in [place] and [place]) -  [address] [place] [address] [phone]  [facility] -  [address] [place] [address] [phone]

## 2016-07-10 NOTE — ED Triage Notes (Signed)
Pt c/o rash to lateral R lower leg for months, states she saw a doctor back in October for this and they thought it was ringworm at first and was supposed to get a cream for it, but never did. Since then, rash has gotten larger and the itching has gotten worse. Denies pain/fevers/swelling.

## 2016-07-10 NOTE — ED Notes (Signed)
Patient Alert and oriented X4. Stable and ambulatory. Patient verbalized understanding of the discharge instructions.  Patient belongings were taken by the patient.  

## 2016-07-10 NOTE — ED Provider Notes (Signed)
[facility]-EMERGENCY DEPT Provider Note   CSN: [id number] Arrival date & time: 07/10/16  1651   By signing my name below, I, Teofilo Pod, attest that this documentation has been prepared under the direction and in the presence of Cruzita Lipa Randall An, MD . Electronically Signed: Teofilo Pod, ED Scribe. 07/10/2016. 6:08 PM.   History   Chief Complaint Chief Complaint  Patient presents with  . Rash   The history is provided by the patient. No language interpreter was used.   HPI Comments:  Janice [name] is a 51 y.o. female with PMHx of HTN who presents to the Emergency Department complaining of a worsening rash on her right lower leg x 6 months. Pt states that the rash area has become larger and itchy over the past 6 months. Pt complains of associated general fatigue, and lightheadedness. Pt has used vaseline, lotion, prescription ointment and was prescribed a cream with no relief. Pt denies chest pain.   Past Medical History:  Diagnosis Date  . Hypertension     Patient Active Problem List   Diagnosis Date Noted  . Vitamin D deficiency 09/29/2015  . CHEST PAIN 10/04/2007  . GASTROENTERITIS, VIRAL, ACUTE 02/27/2007  . HEMORRHOIDS, EXTERNAL W/O COMPLICATION 12/31/2006  . IRREGULAR MENSES 12/31/2006  . Essential hypertension 12/11/2006  . OBESITY, NOS 08/02/2006  . TOBACCO DEPENDENCE 08/02/2006  . Depressive disorder, not elsewhere classified 08/02/2006  . HYPERTENSION, BENIGN SYSTEMIC 08/02/2006  . Esophageal reflux 08/02/2006  . BACK PAIN, LOW 08/02/2006    Past Surgical History:  Procedure Laterality Date  . CESAREAN SECTION     x2    OB History    No data available       Home Medications    Prior to Admission medications   Medication Sig Start Date End Date Taking? Authorizing Provider  amLODipine (NORVASC) 10 MG tablet Take 1 tablet (10 mg total) by mouth daily. 01/24/16   Henrietta Hoover, NP  ergocalciferol (VITAMIN D2) 50000 units capsule Take 1  capsule (50,000 Units total) by mouth once a week. 09/29/15   Massie Maroon, FNP  hydrochlorothiazide (HYDRODIURIL) 25 MG tablet Take 1 tablet (25 mg total) by mouth daily. 01/24/16   Henrietta Hoover, NP  ibuprofen (ADVIL,MOTRIN) 800 MG tablet Take 1 tablet (800 mg total) by mouth every 8 (eight) hours as needed. Patient not taking: Reported on 09/27/2015 11/11/14   Charlestine Night, PA-C  losartan (COZAAR) 100 MG tablet Take 1 tablet (100 mg total) by mouth daily. 01/24/16   Henrietta Hoover, NP  varenicline (CHANTIX STARTING MONTH PAK) 0.5 MG X 11 & 1 MG X 42 tablet Take one 0.5 mg tablet by mouth once daily for 3 days, then increase to one 0.5 mg tablet twice daily for 4 days, then increase to one 1 mg tablet twice daily. Patient not taking: Reported on 01/24/2016 09/27/15   Massie Maroon, FNP    Family History Family History  Problem Relation Age of Onset  . Hypertension Maternal Grandmother     Social History Social History  Substance Use Topics  . Smoking status: Current Every Day Smoker    Packs/day: 0.50    Types: Cigarettes  . Smokeless tobacco: Never Used  . Alcohol use Yes     Comment: occ     Allergies   Neomycin sulfate   Review of Systems Review of Systems  Constitutional: Positive for fatigue.  Cardiovascular: Negative for chest pain.  Skin: Positive for rash.  Neurological:  Positive for light-headedness.  All other systems reviewed and are negative.    Physical Exam Updated Vital Signs BP (!) 211/138 (BP Location: Left Arm)   Pulse 84   Temp 98.7 F (37.1 C) (Oral)   Resp 16   Ht 5' (1.524 m)   Wt 232 lb (105.2 kg)   LMP 07/05/2016 (Exact Date)   SpO2 98%   BMI 45.31 kg/m   Physical Exam  Constitutional: She appears well-developed and well-nourished. No distress.  HENT:  Head: Normocephalic and atraumatic.  Eyes: Conjunctivae are normal.  Cardiovascular: Normal rate.   Pulmonary/Chest: Effort normal.  Abdominal: She exhibits no  distension.  Neurological: She is alert.  Skin: Skin is warm and dry. Rash noted.  Large area to the right lateral lower leg with lichenification, Chronic in nature taking up the majority of this are. No evidence of surrounding infection.   Psychiatric: She has a normal mood and affect.  Nursing note and vitals reviewed.    ED Treatments / Results  DIAGNOSTIC STUDIES:  Oxygen Saturation is 98% on RA, normal by my interpretation.    COORDINATION OF CARE:  6:08 PM Discussed treatment plan with pt at bedside and pt agreed to plan.   Labs (all labs ordered are listed, but only abnormal results are displayed) Labs Reviewed - No data to display  EKG  EKG Interpretation None       Radiology No results found.  Procedures Procedures (including critical care time)  Medications Ordered in ED Medications - No data to display   Initial Impression / Assessment and Plan / ED Course  I have reviewed the triage vital signs and the nursing notes.  Pertinent labs & imaging results that were available during my care of the patient were reviewed by me and considered in my medical decision making (see chart for details).     I personally performed the services described in this documentation, which was scribed in my presence. The recorded information has been reviewed and is accurate.    Patient here for rash on her right lower leg. Patient reports that she's had this for several months. She reports that she tried some medication for ringworm but it did not work. Patient's leg looks chronic rash- ? exzematous rash. We will try a steroid cream and have patient follow up with her primary care physician. Patient reports that [facility] and she can follow-up with them. They will need of referral for dermatology.  In addition patient is not been taking her high blood pressure pills. Patient states she thinks she has refills on her prescriptions but she's not sure.  We will give her prescriptions for amlodipine and hydrochlorothiazide and have her follow-up.  Denies any chest pain headaches or other concerning symptoms.  Final Clinical Impressions(s) / ED Diagnoses   Final diagnoses:  None    New Prescriptions New Prescriptions   No medications on file      Artem Bunte Randall AnLyn Adryanna Friedt, MD 07/10/16 1821

## 2016-07-11 MED FILL — HYDROCHLOROTHIAZIDE 25 MG T: 25 | 30 days supply | Qty: 30 | Fill #1

## 2016-07-11 MED FILL — AMLODIPINE BESYLATE 10 MG T: 10 | 30 days supply | Qty: 30 | Fill #1

## 2016-07-24 ENCOUNTER — Ambulatory Visit: Payer: Self-pay | Attending: Family Medicine | Admitting: Family Medicine

## 2016-07-24 ENCOUNTER — Encounter: Payer: Self-pay | Admitting: Family Medicine

## 2016-07-24 ENCOUNTER — Other Ambulatory Visit: Payer: Self-pay | Admitting: Family Medicine

## 2016-07-24 VITALS — BP 138/95 | HR 91 | Temp 98.4°F | Resp 18 | Ht 60.0 in | Wt 235.2 lb

## 2016-07-24 DIAGNOSIS — Z79899 Other long term (current) drug therapy: Secondary | ICD-10-CM | POA: Insufficient documentation

## 2016-07-24 DIAGNOSIS — I10 Essential (primary) hypertension: Secondary | ICD-10-CM | POA: Insufficient documentation

## 2016-07-24 DIAGNOSIS — Z1231 Encounter for screening mammogram for malignant neoplasm of breast: Secondary | ICD-10-CM

## 2016-07-24 DIAGNOSIS — Z7689 Persons encountering health services in other specified circumstances: Secondary | ICD-10-CM | POA: Insufficient documentation

## 2016-07-24 DIAGNOSIS — R0683 Snoring: Secondary | ICD-10-CM | POA: Insufficient documentation

## 2016-07-24 DIAGNOSIS — B354 Tinea corporis: Secondary | ICD-10-CM | POA: Insufficient documentation

## 2016-07-24 DIAGNOSIS — Z114 Encounter for screening for human immunodeficiency virus [HIV]: Secondary | ICD-10-CM | POA: Insufficient documentation

## 2016-07-24 DIAGNOSIS — R400 Somnolence: Secondary | ICD-10-CM

## 2016-07-24 DIAGNOSIS — Z1239 Encounter for other screening for malignant neoplasm of breast: Secondary | ICD-10-CM

## 2016-07-24 DIAGNOSIS — R21 Rash and other nonspecific skin eruption: Secondary | ICD-10-CM | POA: Insufficient documentation

## 2016-07-24 LAB — BASIC METABOLIC PANEL WITH GFR
BUN: 13 mg/dL (ref 7–25)
CALCIUM: 9 mg/dL (ref 8.6–10.4)
CO2: 33 mmol/L — AB (ref 20–31)
CREATININE: 1.02 mg/dL (ref 0.50–1.05)
Chloride: 95 mmol/L — ABNORMAL LOW (ref 98–110)
GFR, Est African American: 74 mL/min (ref >=60)
GFR, Est Non African American: 64 mL/min (ref >=60)
Glucose, Bld: 98 mg/dL (ref 65–99)
Potassium: 3.2 mmol/L — ABNORMAL LOW (ref 3.5–5.3)
SODIUM: 136 mmol/L (ref 135–146)

## 2016-07-24 LAB — HEPATIC FUNCTION PANEL
ALBUMIN: 3.7 g/dL (ref 3.6–5.1)
ALT: 8 U/L (ref 6–29)
AST: 14 U/L (ref 10–35)
Alkaline Phosphatase: 101 U/L (ref 33–130)
BILIRUBIN INDIRECT: 0.4 mg/dL (ref 0.2–1.2)
Bilirubin, Direct: 0.1 mg/dL (ref <=0.2)
TOTAL PROTEIN: 7.6 g/dL (ref 6.1–8.1)
Total Bilirubin: 0.5 mg/dL (ref 0.2–1.2)

## 2016-07-24 MED ORDER — LOSARTAN POTASSIUM 25 MG PO TABS: 25.0000 mg | ORAL_TABLET | Freq: Every day | ORAL | 2 refills | Status: DC

## 2016-07-24 MED ORDER — HYDROCHLOROTHIAZIDE 50 MG PO TABS: 50.0000 mg | ORAL_TABLET | Freq: Every day | ORAL | 2 refills | Status: DC

## 2016-07-24 MED ORDER — AMLODIPINE BESYLATE 10 MG PO TABS: 10.0000 mg | ORAL_TABLET | Freq: Every day | ORAL | 2 refills | Status: DC

## 2016-07-24 MED ORDER — CLONIDINE HCL 0.2 MG PO TABS
0.2000 mg | ORAL_TABLET | Freq: Once | ORAL | Status: AC
  Administered 2016-07-24: 0.2 mg via ORAL

## 2016-07-24 MED ORDER — TERBINAFINE HCL 250 MG PO TABS: 250.0000 mg | ORAL_TABLET | Freq: Every day | ORAL | 0 refills | Status: DC

## 2016-07-24 MED ORDER — TERBINAFINE HCL 1 % EX CREA: 1.0000 "application " | TOPICAL_CREAM | Freq: Every day | CUTANEOUS | 0 refills | Status: DC

## 2016-07-24 MED FILL — TERBINAFINE HCL 250 MG TAB: 250 | 21 days supply | Qty: 21 | Fill #0

## 2016-07-24 NOTE — Progress Notes (Signed)
Subjective:  Patient ID: Janice [name], female    DOB: 03/05/1966  Age: 51 y.o. MRN: [id number]  CC: Establish Care   HPI Janice SenegalMichelle Y Lagrand presents for   1. Rash to right lower leg:  For 6 months, pruritic, burning sensation, denies drainage. Denies rashes anywhere else. Has tried blue star ointment in the past with no relief of symptoms.  2. Blood pressure medications: Prior to getting medications refilled 2 weeks ago was without antihypertensive medications for 1 month. BP found to be elevated this visit.  3. Snoring: Reports 10 years history of loud snoring at night, daytime sleepiness. She reports being in told by family members that it seems as though she has episodes of apnea.     Outpatient Medications Prior to Visit  Medication Sig Dispense Refill  . amLODipine (NORVASC) 10 MG tablet Take 1 tablet (10 mg total) by mouth daily. 90 tablet 3  . amLODipine (NORVASC) 10 MG tablet Take 1 tablet (10 mg total) by mouth daily. 30 tablet 0  . hydrochlorothiazide (HYDRODIURIL) 25 MG tablet Take 1 tablet (25 mg total) by mouth daily. 30 tablet 2  . hydrochlorothiazide (HYDRODIURIL) 25 MG tablet Take 1 tablet (25 mg total) by mouth daily. 30 tablet 0  . ergocalciferol (VITAMIN D2) 50000 units capsule Take 1 capsule (50,000 Units total) by mouth once a week. (Patient not taking: Reported on 07/24/2016) 12 capsule 5  . hydrocortisone cream 1 % Apply to affected area 2 times daily 15 g 0  . ibuprofen (ADVIL,MOTRIN) 800 MG tablet Take 1 tablet (800 mg total) by mouth every 8 (eight) hours as needed. (Patient not taking: Reported on 09/27/2015) 21 tablet 0  . varenicline (CHANTIX STARTING MONTH PAK) 0.5 MG X 11 & 1 MG X 42 tablet Take one 0.5 mg tablet by mouth once daily for 3 days, then increase to one 0.5 mg tablet twice daily for 4 days, then increase to one 1 mg tablet twice daily. (Patient not taking: Reported on 01/24/2016) 53 tablet 0  . losartan (COZAAR) 100 MG tablet Take 1 tablet (100 mg  total) by mouth daily. (Patient not taking: Reported on 07/24/2016) 30 tablet 2   No facility-administered medications prior to visit.     ROS Review of Systems  HENT: Negative.   Respiratory: Negative.   Cardiovascular: Negative.   Gastrointestinal: Negative.   Skin: Positive for rash (pruritic rash right leg).       Objective:  BP (!) 138/95   Pulse 91   Temp 98.4 F (36.9 C) (Oral)   Resp 18   Ht 5' (1.524 m)   Wt 235 lb 3.2 oz (106.7 kg)   LMP 07/20/2016   SpO2 94%   BMI 45.93 kg/m   BP/Weight 07/24/2016 07/10/2016 01/24/2016  Systolic BP 138 211 203  Diastolic BP 95 138 134  Wt. (Lbs) 235.2 232 236  BMI 45.93 45.31 46.09     Physical Exam  HENT:  Head: Normocephalic.  Right Ear: External ear normal.  Left Ear: External ear normal.  Nose: Nose normal.  Mouth/Throat: Oropharynx is clear and moist.  Eyes: Conjunctivae are normal. Pupils are equal, round, and reactive to light. Right eye exhibits no discharge. Left eye exhibits no discharge.  Cardiovascular: Normal rate, regular rhythm, normal heart sounds and intact distal pulses.   Pulmonary/Chest: Effort normal and breath sounds normal.  Abdominal: Soft. Bowel sounds are normal.  Skin: Skin is warm and dry. Rash noted. No cyanosis.  No swelling or temperature change to the LLE.  Nursing note and vitals reviewed.    Assessment & Plan:   Problem List Items Addressed This Visit      Cardiovascular and Mediastinum   HYPERTENSION, BENIGN SYSTEMIC   Relevant Medications   cloNIDine (CATAPRES) tablet 0.2 mg (Completed)   amLODipine (NORVASC) 10 MG tablet   hydrochlorothiazide (HYDRODIURIL) 50 MG tablet   losartan (COZAAR) 25 MG tablet    Other Visit Diagnoses    Tinea corporis    -  Primary   Relevant Medications   terbinafine (LAMISIL) 250 MG tablet   terbinafine (LAMISIL) 1 % cream   Other Relevant Orders   Hepatic Function Panel (Completed)   Snoring       Relevant Orders   Ambulatory  referral to Neurology   Daytime sleepiness       Relevant Orders   Ambulatory referral to Neurology   Screening for breast cancer       Relevant Orders   MM Digital Screening   Screening for HIV (human immunodeficiency virus)       Relevant Orders   HIV antibody (with reflex) (Completed)   BASIC METABOLIC PANEL WITH GFR (Completed)   Microalbumin/Creatinine Ratio, Urine (Completed)      Meds ordered this encounter  Medications  . cloNIDine (CATAPRES) tablet 0.2 mg  . amLODipine (NORVASC) 10 MG tablet    Sig: Take 1 tablet (10 mg total) by mouth daily.    Dispense:  30 tablet    Refill:  2    Order Specific Question:   Supervising Provider    Answer:   Quentin Angst [id number]  . hydrochlorothiazide (HYDRODIURIL) 50 MG tablet    Sig: Take 1 tablet (50 mg total) by mouth daily.    Dispense:  30 tablet    Refill:  2    Order Specific Question:   Supervising Provider    Answer:   Quentin Angst [id number]  . losartan (COZAAR) 25 MG tablet    Sig: Take 1 tablet (25 mg total) by mouth daily.    Dispense:  30 tablet    Refill:  2    Order Specific Question:   Supervising Provider    Answer:   Quentin Angst [id number]  . terbinafine (LAMISIL) 250 MG tablet    Sig: Take 1 tablet (250 mg total) by mouth daily.    Dispense:  21 tablet    Refill:  0    Order Specific Question:   Supervising Provider    Answer:   Quentin Angst [id number]  . terbinafine (LAMISIL) 1 % cream    Sig: Apply 1 application topically daily. Apply to affected area for 2 weeks.    Dispense:  30 g    Refill:  0    Order Specific Question:   Supervising Provider    Answer:   Quentin Angst [[id number]]    Follow-up: Return in about 2 weeks (around 08/07/2016), or if symptoms worsen or fail to improve, for Physical & BP Check .   Lizbeth Bark FNP

## 2016-07-24 NOTE — Progress Notes (Signed)
Patient is here for establish care  Patient is here for HTN  Patient has not eaten today  Patient has not taking her meds today  Patients stated

## 2016-07-24 NOTE — Patient Instructions (Addendum)
Come back for 2 weeks for physical.  Body Ringworm Introduction Body ringworm is an infection of the skin that often causes a ring-shaped rash. Body ringworm can affect any part of your skin. It can spread easily to others. Body ringworm is also called tinea corporis. What are the causes? This condition is caused by funguses called dermatophytes. The condition develops when these funguses grow out of control on the skin. You can get this condition if you touch a person or animal that has it. You can also get it if you share clothing, bedding, towels, or any other object with an infected person or pet. What increases the risk? This condition is more likely to develop in:  Athletes who often make skin-to-skin contact with other athletes, such as wrestlers.  People who share equipment and mats.  People with a weakened immune system. What are the signs or symptoms? Symptoms of this condition include:  Itchy, raised red spots and bumps.  Red scaly patches.  A ring-shaped rash. The rash may have:  A clear center.  Scales or red bumps at its center.  Redness near its borders.  Dry and scaly skin on or around it. How is this diagnosed? This condition can usually be diagnosed with a skin exam. A skin scraping may be taken from the affected area and examined under a microscope to see if the fungus is present. How is this treated? This condition may be treated with:  An antifungal cream or ointment.  An antifungal shampoo.  Antifungal medicines. These may be prescribed if your ringworm is severe, keeps coming back, or lasts a long time. Follow these instructions at home:  Take over-the-counter and prescription medicines only as told by your health care provider.  If you were given an antifungal cream or ointment:  Use it as told by your health care provider.  Wash the infected area and dry it completely before applying the cream or ointment.  If you were given an antifungal  shampoo:  Use it as told by your health care provider.  Leave the shampoo on your body for 3-5 minutes before rinsing.  While you have a rash:  Wear loose clothing to stop clothes from rubbing and irritating it.  Wash or change your bed sheets every night.  If your pet has the same infection, take your pet to see a International aid/development workerveterinarian. How is this prevented?  Practice good hygiene.  Wear sandals or shoes in public places and showers.  Do not share personal items with others.  Avoid touching red patches of skin on other people.  Avoid touching pets that have bald spots.  If you touch an animal that has a bald spot, wash your hands. Contact a health care provider if:  Your rash continues to spread after 7 days of treatment.  Your rash is not gone in 4 weeks.  The area around your rash gets red, warm, tender, and swollen. This information is not intended to replace advice given to you by your health care provider. Make sure you discuss any questions you have with your health care provider. Document Released: 05/19/2000 Document Revised: 10/28/2015 Document Reviewed: 03/18/2015  2017 Elsevier Hypertension Hypertension is another name for high blood pressure. High blood pressure forces your heart to work harder to pump blood. A blood pressure reading has two numbers, which includes a higher number over a lower number (example: 110/72). Follow these instructions at home:  Have your blood pressure rechecked by your doctor.  Only take medicine  as told by your doctor. Follow the directions carefully. The medicine does not work as well if you skip doses. Skipping doses also puts you at risk for problems.  Do not smoke.  Monitor your blood pressure at home as told by your doctor. Contact a doctor if:  You think you are having a reaction to the medicine you are taking.  You have repeat headaches or feel dizzy.  You have puffiness (swelling) in your ankles.  You have trouble with  your vision. Get help right away if:  You get a very bad headache and are confused.  You feel weak, numb, or faint.  You get chest or belly (abdominal) pain.  You throw up (vomit).  You cannot breathe very well. This information is not intended to replace advice given to you by your health care provider. Make sure you discuss any questions you have with your health care provider. Document Released: 11/08/2007 Document Revised: 10/28/2015 Document Reviewed: 03/14/2013 Elsevier Interactive Patient Education  2017 Elsevier Inc.  Losartan tablets What is this medicine? LOSARTAN (loe SAR tan) is used to treat high blood pressure and to reduce the risk of stroke in certain patients. This drug also slows the progression of kidney disease in patients with diabetes. This medicine may be used for other purposes; ask your health care provider or pharmacist if you have questions. COMMON BRAND NAME(S): Cozaar What should I tell my health care provider before I take this medicine? They need to know if you have any of these conditions: -heart failure -kidney or liver disease -an unusual or allergic reaction to losartan, other medicines, foods, dyes, or preservatives -pregnant or trying to get pregnant -breast-feeding How should I use this medicine? Take this medicine by mouth with a glass of water. Follow the directions on the prescription label. This medicine can be taken with or without food. Take your doses at regular intervals. Do not take your medicine more often than directed. Talk to your pediatrician regarding the use of this medicine in children. Special care may be needed. Overdosage: If you think you have taken too much of this medicine contact a poison control center or emergency room at once. NOTE: This medicine is only for you. Do not share this medicine with others. What if I miss a dose? If you miss a dose, take it as soon as you can. If it is almost time for your next dose, take  only that dose. Do not take double or extra doses. What may interact with this medicine? -blood pressure medicines -diuretics, especially triamterene, spironolactone, or amiloride -fluconazole -NSAIDs, medicines for pain and inflammation, like ibuprofen or naproxen -potassium salts or potassium supplements -rifampin This list may not describe all possible interactions. Give your health care provider a list of all the medicines, herbs, non-prescription drugs, or dietary supplements you use. Also tell them if you smoke, drink alcohol, or use illegal drugs. Some items may interact with your medicine. What should I watch for while using this medicine? Visit your doctor or health care professional for regular checks on your progress. Check your blood pressure as directed. Ask your doctor or health care professional what your blood pressure should be and when you should contact him or her. Call your doctor or health care professional if you notice an irregular or fast heart beat. Women should inform their doctor if they wish to become pregnant or think they might be pregnant. There is a potential for serious side effects to an unborn child, particularly  in the second or third trimester. Talk to your health care professional or pharmacist for more information. You may get drowsy or dizzy. Do not drive, use machinery, or do anything that needs mental alertness until you know how this drug affects you. Do not stand or sit up quickly, especially if you are an older patient. This reduces the risk of dizzy or fainting spells. Alcohol can make you more drowsy and dizzy. Avoid alcoholic drinks. Avoid salt substitutes unless you are told otherwise by your doctor or health care professional. Do not treat yourself for coughs, colds, or pain while you are taking this medicine without asking your doctor or health care professional for advice. Some ingredients may increase your blood pressure. What side effects may I  notice from receiving this medicine? Side effects that you should report to your doctor or health care professional as soon as possible: -confusion, dizziness, light headedness or fainting spells -decreased amount of urine passed -difficulty breathing or swallowing, hoarseness, or tightening of the throat -fast or irregular heart beat, palpitations, or chest pain -skin rash, itching -swelling of your face, lips, tongue, hands, or feet Side effects that usually do not require medical attention (report to your doctor or health care professional if they continue or are bothersome): -cough -decreased sexual function or desire -headache -nasal congestion or stuffiness -nausea or stomach pain -sore or cramping muscles This list may not describe all possible side effects. Call your doctor for medical advice about side effects. You may report side effects to FDA at 1-800-FDA-1088. Where should I keep my medicine? Keep out of the reach of children. Store at room temperature between 15 and 30 degrees C (59 and 86 degrees F). Protect from light. Keep container tightly closed. Throw away any unused medicine after the expiration date. NOTE: This sheet is a summary. It may not cover all possible information. If you have questions about this medicine, talk to your doctor, pharmacist, or health care provider.  2017 Elsevier/Gold Standard (2007-08-02 16:42:18)

## 2016-07-25 LAB — MICROALBUMIN / CREATININE URINE RATIO
CREATININE, URINE: 173 mg/dL (ref 20–320)
MICROALB UR: 0.9 mg/dL
MICROALB/CREAT RATIO: 5 ug/mg{creat} (ref <30)

## 2016-07-25 LAB — HIV ANTIBODY (ROUTINE TESTING W REFLEX): HIV: NONREACTIVE

## 2016-08-02 ENCOUNTER — Other Ambulatory Visit: Payer: Self-pay | Admitting: Family Medicine

## 2016-08-02 DIAGNOSIS — E876 Hypokalemia: Secondary | ICD-10-CM

## 2016-08-02 MED ORDER — POTASSIUM CHLORIDE CRYS ER 20 MEQ PO TBCR: 20.0000 meq | EXTENDED_RELEASE_TABLET | Freq: Two times a day (BID) | ORAL | 0 refills | Status: DC

## 2016-08-07 ENCOUNTER — Telehealth: Payer: Self-pay | Admitting: *Deleted

## 2016-08-07 ENCOUNTER — Encounter: Payer: Self-pay | Admitting: Family Medicine

## 2016-08-07 MED FILL — ?POTASSIUM CL ER 20 MEQ TAB: 20 | 14 days supply | Qty: 28 | Fill #0

## 2016-08-07 NOTE — Telephone Encounter (Signed)
Patient verified DOB Patient is aware of HIV being negative and her kidney function being normal. Patient is also aware of liver function being normal. Patient is aware of potassium being low and [facility]. Patient has an appointment on 08/24/16 with PCP and is aware of recheck possibly being completed at the FU appointment. Patient expressed her understanding and had no further questions at this time.

## 2016-08-07 NOTE — Telephone Encounter (Signed)
-----   Message from Lizbeth BarkMandesia R Hairston, FNP sent at 08/02/2016  3:11 AM EST ----- -HIV is negative.  -Microalbumin/creatinine ratio level was normal. This tests for protein in your urine that can indicate early signs of kidney damage.  -Liver function normal -Potassium level is low. Potassium is important for heart health. You have been prescribed a potassium supplement to help increase your levels. Schedule potassium recheck with lab within 1 week after completing potassium.

## 2016-08-10 MED FILL — HYDROCHLOROTHIAZIDE 25 MG T: 25 | 30 days supply | Qty: 60 | Fill #0

## 2016-08-10 MED FILL — LOSARTAN POTASSIUM 25 MG TA: 25 | 30 days supply | Qty: 30 | Fill #0

## 2016-08-24 ENCOUNTER — Other Ambulatory Visit: Payer: Self-pay

## 2016-08-24 ENCOUNTER — Ambulatory Visit: Payer: Self-pay | Attending: Family Medicine | Admitting: Family Medicine

## 2016-08-24 VITALS — BP 150/99 | HR 86 | Temp 98.2°F | Resp 18 | Ht 60.0 in | Wt 234.0 lb

## 2016-08-24 DIAGNOSIS — F17200 Nicotine dependence, unspecified, uncomplicated: Secondary | ICD-10-CM

## 2016-08-24 DIAGNOSIS — I10 Essential (primary) hypertension: Secondary | ICD-10-CM

## 2016-08-24 DIAGNOSIS — Z0000 Encounter for general adult medical examination without abnormal findings: Secondary | ICD-10-CM

## 2016-08-24 DIAGNOSIS — R21 Rash and other nonspecific skin eruption: Secondary | ICD-10-CM

## 2016-08-24 DIAGNOSIS — Z87898 Personal history of other specified conditions: Secondary | ICD-10-CM

## 2016-08-24 DIAGNOSIS — Z79899 Other long term (current) drug therapy: Secondary | ICD-10-CM | POA: Insufficient documentation

## 2016-08-24 DIAGNOSIS — F17210 Nicotine dependence, cigarettes, uncomplicated: Secondary | ICD-10-CM | POA: Insufficient documentation

## 2016-08-24 LAB — CBC WITH DIFFERENTIAL/PLATELET
BASOS ABS: 0 {cells}/uL (ref 0–200)
Basophils Relative: 0 %
EOS PCT: 1 %
Eosinophils Absolute: 135 cells/uL (ref 15–500)
HCT: 39.2 % (ref 35.0–45.0)
Hemoglobin: 12 g/dL (ref 11.7–15.5)
Lymphocytes Relative: 23 %
Lymphs Abs: 3105 cells/uL (ref 850–3900)
MCH: 23.4 pg — AB (ref 27.0–33.0)
MCHC: 30.6 g/dL — AB (ref 32.0–36.0)
MCV: 76.6 fL — ABNORMAL LOW (ref 80.0–100.0)
MONOS PCT: 5 %
MPV: 9 fL (ref 7.5–12.5)
Monocytes Absolute: 675 cells/uL (ref 200–950)
NEUTROS ABS: 9585 {cells}/uL — AB (ref 1500–7800)
NEUTROS PCT: 71 %
Platelets: 313 10*3/uL (ref 140–400)
RBC: 5.12 MIL/uL — ABNORMAL HIGH (ref 3.80–5.10)
RDW: 14.8 % (ref 11.0–15.0)
WBC: 13.5 10*3/uL — ABNORMAL HIGH (ref 3.8–10.8)

## 2016-08-24 MED ORDER — NICOTINE POLACRILEX 4 MG MT GUM: 4.0000 mg | CHEWING_GUM | OROMUCOSAL | 1 refills | Status: DC | PRN

## 2016-08-24 MED ORDER — AMLODIPINE BESYLATE 10 MG PO TABS: 10.0000 mg | ORAL_TABLET | Freq: Every day | ORAL | 2 refills | Status: DC

## 2016-08-24 MED ORDER — TRIAMCINOLONE ACETONIDE 0.025 % EX OINT
1.0000 | TOPICAL_OINTMENT | Freq: Two times a day (BID) | CUTANEOUS | 0 refills | Status: DC
Start: 2016-08-24 — End: 2019-03-06

## 2016-08-24 MED FILL — AMLODIPINE BESYLATE 10 MG T: 10 | 30 days supply | Qty: 30 | Fill #0

## 2016-08-24 MED FILL — TRIAMCINOLONE 0.025% OINT: 0.025 | 20 days supply | Qty: 30 | Fill #0

## 2016-08-24 NOTE — Progress Notes (Signed)
Patient is here for a physical  Patient complains about still having the rash onher right leg

## 2016-08-24 NOTE — Progress Notes (Signed)
Subjective:   Patient ID: Janice SenegalMichelle Y [name], female    DOB: 09/03/1965, 51 y.o.   MRN: [id number]  Chief Complaint  Patient presents with  . Establish Care   HPI Janice [name] 51 y.o. female presents with   Annual physical exam: History of HTN. Reports only taking  taking two of blood pressure medications due to financial constraints. Denies any CP or SOB. Reports history of palpitations 2 months ago. Palpitations can occur during rest or with walking.  Current smoker everydaycigarette smoker, 1 pack every 3 days. Reports interest in quitting with nicotine patch.  Reports being called by breast imaging clinic to schedule  Routine MM breast screening. Repots last eye exam "years ago". History of myopia with corretctive lens use. Reports last dental exam was " years ago". Reports FH of HTN. MGM, mother, and daughter have HTN. Denies any FH of DM or cancer.  Back pain: Began about 1 months ago. Reports heavy ligifnt with occupation. She works in a [employer]. Takes Aleve with adquate pain relief.    Rash to right lower leg:  For 6 months, pruritic, burning sensation, denies drainage. Denies rashes anywhere else. Has tried blue star ointment in the past with no relief of symptoms. Antifungals prescribed at last visit. Reports minimal improvement of symptoms.   Past Medical History:  Diagnosis Date  . Hypertension     Past Surgical History:  Procedure Laterality Date  . CESAREAN SECTION     x2    Family History  Problem Relation Age of Onset  . Hypertension Maternal Grandmother     Social History   Social History  . Marital status: Single    Spouse name: N/A  . Number of children: N/A  . Years of education: N/A   Occupational History  . Not on file.   Social History Main Topics  . Smoking status: Current Every Day Smoker    Packs/day: 0.50    Types: Cigarettes  . Smokeless tobacco: Never Used  . Alcohol use Yes     Comment: occ  . Drug use: No  . Sexual  activity: Not on file   Other Topics Concern  . Not on file   Social History Narrative  . No narrative on file    Outpatient Medications Prior to Visit  Medication Sig Dispense Refill  . amLODipine (NORVASC) 10 MG tablet Take 1 tablet (10 mg total) by mouth daily. 30 tablet 2  . hydrochlorothiazide (HYDRODIURIL) 50 MG tablet Take 1 tablet (50 mg total) by mouth daily. 30 tablet 2  . potassium chloride SA (K-DUR,KLOR-CON) 20 MEQ tablet Take 1 tablet (20 mEq total) by mouth 2 (two) times daily. 28 tablet 0  . ergocalciferol (VITAMIN D2) 50000 units capsule Take 1 capsule (50,000 Units total) by mouth once a week. (Patient not taking: Reported on 07/24/2016) 12 capsule 5  . hydrocortisone cream 1 % Apply to affected area 2 times daily 15 g 0  . ibuprofen (ADVIL,MOTRIN) 800 MG tablet Take 1 tablet (800 mg total) by mouth every 8 (eight) hours as needed. (Patient not taking: Reported on 09/27/2015) 21 tablet 0  . losartan (COZAAR) 25 MG tablet Take 1 tablet (25 mg total) by mouth daily. 30 tablet 2  . terbinafine (LAMISIL) 1 % cream Apply 1 application topically daily. Apply to affected area for 2 weeks. 30 g 0  . terbinafine (LAMISIL) 250 MG tablet Take 1 tablet (250 mg total) by mouth daily. 21 tablet 0  . varenicline (CHANTIX  STARTING MONTH PAK) 0.5 MG X 11 & 1 MG X 42 tablet Take one 0.5 mg tablet by mouth once daily for 3 days, then increase to one 0.5 mg tablet twice daily for 4 days, then increase to one 1 mg tablet twice daily. (Patient not taking: Reported on 01/24/2016) 53 tablet 0   No facility-administered medications prior to visit.     Allergies  Allergen Reactions  . Neomycin Sulfate Swelling, Rash and Other (See Comments)    Pain in ear, swelling and redness    Review of Systems  Constitutional: Negative.   HENT: Negative.   Eyes: Positive for blurred vision.  Respiratory: Negative.   Cardiovascular: Positive for orthopnea and leg swelling.  Gastrointestinal: Positive for  constipation.  Genitourinary: Negative.   Musculoskeletal: Positive for back pain.  Skin: Positive for rash.  Neurological: Positive for headaches.       Near syncope  Psychiatric/Behavioral: Negative.        Objective:    Physical Exam  Constitutional: She is oriented to person, place, and time. She appears well-developed and well-nourished.  HENT:  Head: Normocephalic and atraumatic.  Right Ear: External ear normal.  Left Ear: External ear normal.  Nose: Nose normal.  Mouth/Throat: Oropharynx is clear and moist.  Eyes: Conjunctivae and EOM are normal. Pupils are equal, round, and reactive to light.  Neck: Normal range of motion. Neck supple.  Cardiovascular: Normal rate, regular rhythm, normal heart sounds and intact distal pulses.   Pulmonary/Chest: Effort normal and breath sounds normal.  Abdominal: Soft. Bowel sounds are normal.  Musculoskeletal: Normal range of motion.  Neurological: She is alert and oriented to person, place, and time. She has normal reflexes.  Skin: Skin is warm and dry. Rash noted.  Large area to the right lateral lower leg with lichenification.  No evidence of surrounding infection. Dry, flaky skin.   Psychiatric: She has a normal mood and affect. Her behavior is normal. Thought content normal.  Nursing note and vitals reviewed.   BP (!) 150/99 (BP Location: Left Arm, Patient Position: Sitting, Cuff Size: Normal)   Pulse 86   Temp 98.2 F (36.8 C) (Oral)   Resp 18   Ht 5' (1.524 m)   Wt 234 lb (106.1 kg)   SpO2 98%   BMI 45.70 kg/m  Wt Readings from Last 3 Encounters:  08/24/16 234 lb (106.1 kg)  07/24/16 235 lb 3.2 oz (106.7 kg)  07/10/16 232 lb (105.2 kg)    Immunization History  Administered Date(s) Administered  . Pneumococcal Polysaccharide-23 09/27/2015  . Td 02/03/2002    Diabetic Foot Exam - Simple   No data filed      Lab Results  Component Value Date   TSH 1.24 09/27/2015   Lab Results  Component Value Date   WBC  12.3 (H) 01/24/2016   HGB 12.7 01/24/2016   HCT 40.8 01/24/2016   MCV 77.0 (L) 01/24/2016   PLT 284 01/24/2016   Lab Results  Component Value Date   NA 136 07/24/2016   K 3.2 (L) 07/24/2016   CO2 33 (H) 07/24/2016   GLUCOSE 98 07/24/2016   BUN 13 07/24/2016   CREATININE 1.02 07/24/2016   BILITOT 0.5 07/24/2016   ALKPHOS 101 07/24/2016   AST 14 07/24/2016   ALT 8 07/24/2016   PROT 7.6 07/24/2016   ALBUMIN 3.7 07/24/2016   CALCIUM 9.0 07/24/2016   ANIONGAP 9 11/11/2014   Lab Results  Component Value Date   CHOL 141 09/27/2015  CHOL 134 10/04/2007   Lab Results  Component Value Date   HDL 37 (L) 09/27/2015   HDL 41 10/04/2007   Lab Results  Component Value Date   LDLCALC 82 09/27/2015   LDLCALC 79 10/04/2007   Lab Results  Component Value Date   TRIG 108 09/27/2015   TRIG 71 10/04/2007   Lab Results  Component Value Date   CHOLHDL 3.8 09/27/2015   CHOLHDL 3.3 Ratio 10/04/2007   Lab Results  Component Value Date   HGBA1C 5.9 (H) 09/27/2015       Assessment & Plan:   Problem List Items Addressed This Visit   Cardiovascular and Mediastinum  HYPERTENSION, BENIGN SYSTEMIC  -Schedule BP recheck in 2 weeks with nurse.  -If BP is greater than 90/60 (MAP 65 or greater) but not less than 130/80 may increase dose of   losartan to 50 mg QD  and recheck in another 2 weeks with RN.   Relevant Medications  amLODipine (NORVASC) 10 MG tablet    Other Visit Diagnoses    Annual physical exam    -  Primary   -Visual acuity screen.   Relevant Orders   COMPLETE METABOLIC PANEL WITH GFR (Completed)   Lipid Panel (Completed)   Hemoglobin A1c (Completed)   TSH (Completed)   VITAMIN D 25 Hydroxy (Vit-D Deficiency, Fractures) (Completed)   CBC with Differential (Completed)   Ambulatory referral to Ophthalmology   Ambulatory referral to Dentistry   Skin rash       Relevant Medications   triamcinolone (KENALOG) 0.025 % ointment   Other Relevant Orders   Ambulatory  referral to Dermatology   COMPLETE METABOLIC PANEL WITH GFR (Completed)   History of palpitations       -EKG performed in office.    Ready to quit smoking       Relevant Medications   nicotine polacrilex (NICORETTE) 4 MG gum      Follow up: Return in about 2 weeks (around 09/07/2016) for BP check with RN.    Arrie Senate, FNP

## 2016-08-24 NOTE — Patient Instructions (Addendum)
Schedule appointment for Hypertension follow up with PCP in 3 months.   Nicotine chewing gum What is this medicine? NICOTINE (NIK oh teen) helps people stop smoking. This medicine replaces the nicotine found in cigarettes and helps to decrease withdrawal effects. It is most effective when used in combination with a stop-smoking program. This medicine may be used for other purposes; ask your health care provider or pharmacist if you have questions. COMMON BRAND NAME(S): NICOrelief, Nicorette What should I tell my health care provider before I take this medicine? They need to know if you have any of these conditions: -diabetes -heart disease, angina, irregular heartbeat or previous heart attack -high blood pressure -lung disease, including asthma -overactive thyroid -pheochromocytoma -seizures or history of seizures -stomach problems or ulcers -an unusual or allergic reaction to nicotine, other medicines, foods, dyes, or preservatives -pregnant or trying to get pregnant -breast-feeding How should I use this medicine? Chew but do not swallow the gum. Follow the directions that come with the chewing gum. Use exactly as directed. When you feel an urgent desire for a cigarette, chew one piece of gum slowly. Continue chewing until you taste the gum or feel a slight tingling in your mouth. Then, stop chewing and place the gum between your cheek and gum. Wait until the taste or tingling is almost gone then start chewing again. Continue chewing in this manner for about 30 minutes. Slow chewing helps reduce cravings and also helps reduce the chance for heartburn or other gastrointestinal side effects. Talk to your pediatrician regarding the use of this medicine in children. Special care may be needed. Overdosage: If you think you have taken too much of this medicine contact a poison control center or emergency room at once. NOTE: This medicine is only for you. Do not share this medicine with  others. What if I miss a dose? This does not apply. Only use the chewing gum when you have a strong desire to smoke. Do not use more than one piece of gum at a time. What may interact with this medicine? -medicines for asthma -medicines for blood pressure -medicines for mental depression This list may not describe all possible interactions. Give your health care provider a list of all the medicines, herbs, non-prescription drugs, or dietary supplements you use. Also tell them if you smoke, drink alcohol, or use illegal drugs. Some items may interact with your medicine. What should I watch for while using this medicine? Always carry the nicotine gum with you. Do not use more than 30 pieces of gum a day. Too much gum can increase the risk of an overdose. As the urge to smoke gets less, gradually reduce the number of pieces each day over a period of 2 to 3 months. When you are only using 1 or 2 pieces a day, stop using the nicotine gum. You should begin using the nicotine gum the day you stop smoking. It is okay if you do not succeed with the attempt to quit and have a cigarette. You can still continue your quit attempt and keep using the product as directed. Just throw away your cigarettes and get back to your quit plan. If your mouth gets sore from chewing the gum, suck hard sugarless candy between pieces of gum to help relieve the soreness. Brush your teeth regularly to reduce mouth irritation. If you wear dentures, contact your doctor or health care professional if the gum sticks to your dental work. If you are a diabetic and you quit smoking,  the effects of insulin may be increased and you may need to reduce your insulin dose. Check with your doctor or health care professional about how you should adjust your insulin dose. What side effects may I notice from receiving this medicine? Side effects that you should report to your doctor or health care professional as soon as possible: -allergic  reactions like skin rash, itching or hives, swelling of the face, lips, or tongue -blisters in mouth -breathing problems -changes in hearing -changes in vision -chest pain -cold sweats -confusion -fast, irregular heartbeat -feeling faint or lightheaded, falls -headache -increased saliva -nausea, vomiting -stomach pain -weakness Side effects that usually do not require medical attention (report to your doctor or health care professional if they continue or are bothersome): -diarrhea -dry mouth -hiccups -irritability -nervousness or restlessness -trouble sleeping or vivid dreams This list may not describe all possible side effects. Call your doctor for medical advice about side effects. You may report side effects to FDA at 1-800-FDA-1088. Where should I keep my medicine? Keep out of the reach of children. Store at room temperature between 15 and 30 degrees C (59 and 86 degrees F). Protect from heat and light. Throw away unused medicine after the expiration date. NOTE: This sheet is a summary. It may not cover all possible information. If you have questions about this medicine, talk to your doctor, pharmacist, or health care provider.  2018 Elsevier/Gold Standard (2014-11-16 19:37:14)   DASH Eating Plan DASH stands for "Dietary Approaches to Stop Hypertension." The DASH eating plan is a healthy eating plan that has been shown to reduce high blood pressure (hypertension). It may also reduce your risk for type 2 diabetes, heart disease, and stroke. The DASH eating plan may also help with weight loss. What are tips for following this plan? General guidelines   Avoid eating more than 2,300 mg (milligrams) of salt (sodium) a day. If you have hypertension, you may need to reduce your sodium intake to 1,500 mg a day.  Limit alcohol intake to no more than 1 drink a day for nonpregnant women and 2 drinks a day for men. One drink equals 12 oz of beer, 5 oz of wine, or 1 oz of hard  liquor.  Work with your health care provider to maintain a healthy body weight or to lose weight. Ask what an ideal weight is for you.  Get at least 30 minutes of exercise that causes your heart to beat faster (aerobic exercise) most days of the week. Activities may include walking, swimming, or biking.  Work with your health care provider or diet and nutrition specialist (dietitian) to adjust your eating plan to your individual calorie needs. Reading food labels   Check food labels for the amount of sodium per serving. Choose foods with less than 5 percent of the Daily Value of sodium. Generally, foods with less than 300 mg of sodium per serving fit into this eating plan.  To find whole grains, look for the word "whole" as the first word in the ingredient list. Shopping   Buy products labeled as "low-sodium" or "no salt added."  Buy fresh foods. Avoid canned foods and premade or frozen meals. Cooking   Avoid adding salt when cooking. Use salt-free seasonings or herbs instead of table salt or sea salt. Check with your health care provider or pharmacist before using salt substitutes.  Do not fry foods. Cook foods using healthy methods such as baking, boiling, grilling, and broiling instead.  Cook with heart-healthy oils,  such as olive, canola, soybean, or sunflower oil. Meal planning    Eat a balanced diet that includes:  5 or more servings of fruits and vegetables each day. At each meal, try to fill half of your plate with fruits and vegetables.  Up to 6-8 servings of whole grains each day.  Less than 6 oz of lean meat, poultry, or fish each day. A 3-oz serving of meat is about the same size as a deck of cards. One egg equals 1 oz.  2 servings of low-fat dairy each day.  A serving of nuts, seeds, or beans 5 times each week.  Heart-healthy fats. Healthy fats called Omega-3 fatty acids are found in foods such as flaxseeds and coldwater fish, like sardines, salmon, and  mackerel.  Limit how much you eat of the following:  Canned or prepackaged foods.  Food that is high in trans fat, such as fried foods.  Food that is high in saturated fat, such as fatty meat.  Sweets, desserts, sugary drinks, and other foods with added sugar.  Full-fat dairy products.  Do not salt foods before eating.  Try to eat at least 2 vegetarian meals each week.  Eat more home-cooked food and less restaurant, buffet, and fast food.  When eating at a restaurant, ask that your food be prepared with less salt or no salt, if possible. What foods are recommended? The items listed may not be a complete list. Talk with your dietitian about what dietary choices are best for you. Grains  Whole-grain or whole-wheat bread. Whole-grain or whole-wheat pasta. Brown rice. Orpah Cobb. Bulgur. Whole-grain and low-sodium cereals. Pita bread. Low-fat, low-sodium crackers. Whole-wheat flour tortillas. Vegetables  Fresh or frozen vegetables (raw, steamed, roasted, or grilled). Low-sodium or reduced-sodium tomato and vegetable juice. Low-sodium or reduced-sodium tomato sauce and tomato paste. Low-sodium or reduced-sodium canned vegetables. Fruits  All fresh, dried, or frozen fruit. Canned fruit in natural juice (without added sugar). Meat and other protein foods  Skinless chicken or [place]. Ground chicken or [place]. Pork with fat trimmed off. Fish and seafood. Egg whites. Dried beans, peas, or lentils. Unsalted nuts, nut butters, and seeds. Unsalted canned beans. Lean cuts of beef with fat trimmed off. Low-sodium, lean deli meat. Dairy  Low-fat (1%) or fat-free (skim) milk. Fat-free, low-fat, or reduced-fat cheeses. Nonfat, low-sodium ricotta or cottage cheese. Low-fat or nonfat yogurt. Low-fat, low-sodium cheese. Fats and oils  Soft margarine without trans fats. Vegetable oil. Low-fat, reduced-fat, or light mayonnaise and salad dressings (reduced-sodium). Canola, safflower, olive, soybean,  and sunflower oils. Avocado. Seasoning and other foods  Herbs. Spices. Seasoning mixes without salt. Unsalted popcorn and pretzels. Fat-free sweets. What foods are not recommended? The items listed may not be a complete list. Talk with your dietitian about what dietary choices are best for you. Grains  Baked goods made with fat, such as croissants, muffins, or some breads. Dry pasta or rice meal packs. Vegetables  Creamed or fried vegetables. Vegetables in a cheese sauce. Regular canned vegetables (not low-sodium or reduced-sodium). Regular canned tomato sauce and paste (not low-sodium or reduced-sodium). Regular tomato and vegetable juice (not low-sodium or reduced-sodium). Rosita Fire. Olives. Fruits  Canned fruit in a light or heavy syrup. Fried fruit. Fruit in cream or butter sauce. Meat and other protein foods  Fatty cuts of meat. Ribs. Fried meat. Tomasa Blase. Sausage. Bologna and other processed lunch meats. Salami. Fatback. Hotdogs. Bratwurst. Salted nuts and seeds. Canned beans with added salt. Canned or smoked fish. Whole eggs or  egg yolks. Chicken or [place] with skin. Dairy  Whole or 2% milk, cream, and half-and-half. Whole or full-fat cream cheese. Whole-fat or sweetened yogurt. Full-fat cheese. Nondairy creamers. Whipped toppings. Processed cheese and cheese spreads. Fats and oils  Butter. Stick margarine. Lard. Shortening. Ghee. Bacon fat. Tropical oils, such as coconut, palm kernel, or palm oil. Seasoning and other foods  Salted popcorn and pretzels. Onion salt, garlic salt, seasoned salt, table salt, and sea salt. Worcestershire sauce. Tartar sauce. Barbecue sauce. Teriyaki sauce. Soy sauce, including reduced-sodium. Steak sauce. Canned and packaged gravies. Fish sauce. Oyster sauce. Cocktail sauce. Horseradish that you find on the shelf. Ketchup. Mustard. Meat flavorings and tenderizers. Bouillon cubes. Hot sauce and Tabasco sauce. Premade or packaged marinades. Premade or packaged taco  seasonings. Relishes. Regular salad dressings. Where to find more information:  National Heart, Lung, and Blood Institute: PopSteam.is  American Heart Association: www.heart.org Summary  The DASH eating plan is a healthy eating plan that has been shown to reduce high blood pressure (hypertension). It may also reduce your risk for type 2 diabetes, heart disease, and stroke.  With the DASH eating plan, you should limit salt (sodium) intake to 2,300 mg a day. If you have hypertension, you may need to reduce your sodium intake to 1,500 mg a day.  When on the DASH eating plan, aim to eat more fresh fruits and vegetables, whole grains, lean proteins, low-fat dairy, and heart-healthy fats.  Work with your health care provider or diet and nutrition specialist (dietitian) to adjust your eating plan to your individual calorie needs. This information is not intended to replace advice given to you by your health care provider. Make sure you discuss any questions you have with your health care provider. Document Released: 05/11/2011 Document Revised: 05/15/2016 Document Reviewed: 05/15/2016 Elsevier Interactive Patient Education  2017 [employer].

## 2016-08-25 LAB — COMPLETE METABOLIC PANEL WITH GFR
ALBUMIN: 3.8 g/dL (ref 3.6–5.1)
ALT: 9 U/L (ref 6–29)
AST: 16 U/L (ref 10–35)
Alkaline Phosphatase: 97 U/L (ref 33–130)
BUN: 20 mg/dL (ref 7–25)
CO2: 29 mmol/L (ref 20–31)
CREATININE: 1.16 mg/dL — AB (ref 0.50–1.05)
Calcium: 9.2 mg/dL (ref 8.6–10.4)
Chloride: 99 mmol/L (ref 98–110)
GFR, EST AFRICAN AMERICAN: 63 mL/min (ref >=60)
GFR, Est Non African American: 55 mL/min — ABNORMAL LOW (ref >=60)
Glucose, Bld: 88 mg/dL (ref 65–99)
Potassium: 3.4 mmol/L — ABNORMAL LOW (ref 3.5–5.3)
Sodium: 140 mmol/L (ref 135–146)
TOTAL PROTEIN: 7.5 g/dL (ref 6.1–8.1)
Total Bilirubin: 0.4 mg/dL (ref 0.2–1.2)

## 2016-08-25 LAB — HEMOGLOBIN A1C
HEMOGLOBIN A1C: 5.8 % — AB (ref <5.7)
Mean Plasma Glucose: 120 mg/dL

## 2016-08-25 LAB — VITAMIN D 25 HYDROXY (VIT D DEFICIENCY, FRACTURES): VIT D 25 HYDROXY: 19 ng/mL — AB (ref 30–100)

## 2016-08-25 LAB — LIPID PANEL
Cholesterol: 144 mg/dL (ref <200)
HDL: 39 mg/dL — ABNORMAL LOW (ref >50)
LDL CALC: 83 mg/dL (ref <100)
TRIGLYCERIDES: 112 mg/dL (ref <150)
Total CHOL/HDL Ratio: 3.7 Ratio (ref <5.0)
VLDL: 22 mg/dL (ref <30)

## 2016-08-25 LAB — TSH: TSH: 1.11 m[IU]/L

## 2016-08-29 ENCOUNTER — Encounter: Payer: Self-pay | Admitting: Family Medicine

## 2016-08-31 ENCOUNTER — Telehealth: Payer: Self-pay

## 2016-08-31 ENCOUNTER — Other Ambulatory Visit: Payer: Self-pay | Admitting: Family Medicine

## 2016-08-31 DIAGNOSIS — E876 Hypokalemia: Secondary | ICD-10-CM

## 2016-08-31 DIAGNOSIS — E559 Vitamin D deficiency, unspecified: Secondary | ICD-10-CM

## 2016-08-31 DIAGNOSIS — R7303 Prediabetes: Secondary | ICD-10-CM

## 2016-08-31 DIAGNOSIS — Z9189 Other specified personal risk factors, not elsewhere classified: Secondary | ICD-10-CM

## 2016-08-31 MED ORDER — POTASSIUM CHLORIDE CRYS ER 20 MEQ PO TBCR: 20.0000 meq | EXTENDED_RELEASE_TABLET | Freq: Two times a day (BID) | ORAL | 0 refills | Status: DC

## 2016-08-31 MED ORDER — ROSUVASTATIN CALCIUM 10 MG PO TABS: 10.0000 mg | ORAL_TABLET | Freq: Every day | ORAL | 2 refills | Status: DC

## 2016-08-31 MED ORDER — VITAMIN D (ERGOCALCIFEROL) 1.25 MG (50000 UNIT) PO CAPS: 50000.0000 [IU] | ORAL_CAPSULE | ORAL | 0 refills | Status: AC

## 2016-08-31 NOTE — Telephone Encounter (Signed)
CMA call to go over lab results   Patient did not answer but CMA left a VM stating the reason of the call & to call me back    

## 2016-08-31 NOTE — Telephone Encounter (Signed)
-----   Message from Lizbeth BarkMandesia R Hairston, FNP sent at 08/31/2016  4:34 PM EDT ----- -HgbA1c is 5.7 which is indicates you have  pre-diabetes. Start eating a carbohydrate modified diet. Avoid eating large amounts of starches, white bread, rice, and sugar. Increase physical activity.  -White blood cell count elevated but stable from previous lab. -Thyroid function normal. Liver function normal.  -Potassium level is low. Potassium is important for heart health. You will be prescribed a potassium supplement. Lipid levels were elevated, this can increase your risk of heart disease. You will be prescribed crestor to help reduce risk. Recheck in 3 months.  -Vitamin D level was low. Vitamin D helps to keep bones strong. You were prescribed ergocalciferol (capsules) to increase your vitamin-d level. Once finished start taking OTC vitamin d supplement with 800 international units (IU) of vitamin-d per day.

## 2016-09-04 MED FILL — ROSUVASTATIN CALCIUM 10 MG: 10 | 30 days supply | Qty: 30 | Fill #0

## 2016-09-04 MED FILL — POTASSIUM CL ER 20 MEQ TAB: 20 | 10 days supply | Qty: 20 | Fill #0

## 2016-09-04 MED FILL — VIT D2 1.25 MG (50,000 UNIT: 1.25 MG | 84 days supply | Qty: 8 | Fill #0

## 2016-09-07 ENCOUNTER — Ambulatory Visit: Payer: Self-pay | Attending: Family Medicine | Admitting: *Deleted

## 2016-09-07 VITALS — BP 154/106 | HR 89

## 2016-09-07 DIAGNOSIS — I10 Essential (primary) hypertension: Secondary | ICD-10-CM | POA: Insufficient documentation

## 2016-09-07 DIAGNOSIS — R0789 Other chest pain: Secondary | ICD-10-CM | POA: Insufficient documentation

## 2016-09-07 DIAGNOSIS — R51 Headache: Secondary | ICD-10-CM | POA: Insufficient documentation

## 2016-09-07 NOTE — Progress Notes (Signed)
Pt here for BP check.  Pt states she feels a little chest pain after taking losartan.  Other medications she take for BP are ok, she does not experience any sx's with.  She has light headaches daily that last 15 minutes. Pt adds she knows that she should wear glasses.  Denies blurred vision.   Pt provider notified. Nurse visit will be routed to provider. Medications adjustment will be made by provider. Pt aware.

## 2016-09-08 ENCOUNTER — Other Ambulatory Visit: Payer: Self-pay | Admitting: Family Medicine

## 2016-09-08 ENCOUNTER — Telehealth: Payer: Self-pay | Admitting: Family Medicine

## 2016-09-08 DIAGNOSIS — I10 Essential (primary) hypertension: Secondary | ICD-10-CM

## 2016-09-08 MED ORDER — LISINOPRIL 40 MG PO TABS: 20.0000 mg | ORAL_TABLET | Freq: Every day | ORAL | 2 refills | Status: DC

## 2016-09-08 NOTE — Telephone Encounter (Signed)
Called and spoke with patient about her medication concern. She denies any radiating CP or SOB. Has history  HTCZ use and hypokalemia. Losartan and HTCZ discontinued. Lisinopril added. She is agreeable with plan. Patient was given side effect precautions and was told to contact office asap if side effects occur. She communicates understanding. Schedule BP recheck with RN in 2 weeks.

## 2016-09-25 ENCOUNTER — Ambulatory Visit: Payer: Self-pay | Attending: Family Medicine | Admitting: *Deleted

## 2016-09-25 VITALS — BP 162/110 | HR 76

## 2016-09-25 DIAGNOSIS — I10 Essential (primary) hypertension: Secondary | ICD-10-CM | POA: Insufficient documentation

## 2016-11-24 ENCOUNTER — Ambulatory Visit: Payer: Self-pay | Admitting: Family Medicine

## 2016-12-18 ENCOUNTER — Other Ambulatory Visit: Payer: Self-pay | Admitting: Family Medicine

## 2016-12-18 DIAGNOSIS — E559 Vitamin D deficiency, unspecified: Secondary | ICD-10-CM

## 2016-12-18 DIAGNOSIS — R21 Rash and other nonspecific skin eruption: Secondary | ICD-10-CM

## 2016-12-18 DIAGNOSIS — E876 Hypokalemia: Secondary | ICD-10-CM

## 2016-12-18 MED FILL — AMLODIPINE BESYLATE 10 MG T: 10 | 30 days supply | Qty: 30 | Fill #1

## 2016-12-18 MED FILL — ROSUVASTATIN CALCIUM 10 MG: 10 | 30 days supply | Qty: 30 | Fill #1

## 2016-12-18 MED FILL — HYDROCHLOROTHIAZIDE 25 MG T: 25 | 30 days supply | Qty: 60 | Fill #1

## 2016-12-18 MED FILL — LISINOPRIL 40 MG TABLET: 40 | 30 days supply | Qty: 15 | Fill #0

## 2018-12-16 ENCOUNTER — Ambulatory Visit: Payer: Self-pay | Admitting: Medical

## 2019-03-06 ENCOUNTER — Ambulatory Visit (INDEPENDENT_AMBULATORY_CARE_PROVIDER_SITE_OTHER): Payer: BC Managed Care – PPO | Admitting: Family Medicine

## 2019-03-06 ENCOUNTER — Telehealth: Payer: Self-pay

## 2019-03-06 ENCOUNTER — Encounter: Payer: Self-pay | Admitting: Family Medicine

## 2019-03-06 ENCOUNTER — Ambulatory Visit (HOSPITAL_COMMUNITY): Payer: BC Managed Care – PPO | Attending: Family Medicine

## 2019-03-06 ENCOUNTER — Other Ambulatory Visit: Payer: Self-pay

## 2019-03-06 VITALS — BP 154/98 | HR 85 | Ht 60.0 in | Wt 228.0 lb

## 2019-03-06 DIAGNOSIS — L409 Psoriasis, unspecified: Secondary | ICD-10-CM

## 2019-03-06 DIAGNOSIS — Z1211 Encounter for screening for malignant neoplasm of colon: Secondary | ICD-10-CM | POA: Insufficient documentation

## 2019-03-06 DIAGNOSIS — I10 Essential (primary) hypertension: Secondary | ICD-10-CM

## 2019-03-06 DIAGNOSIS — R079 Chest pain, unspecified: Secondary | ICD-10-CM | POA: Diagnosis not present

## 2019-03-06 DIAGNOSIS — Z114 Encounter for screening for human immunodeficiency virus [HIV]: Secondary | ICD-10-CM | POA: Insufficient documentation

## 2019-03-06 DIAGNOSIS — F17200 Nicotine dependence, unspecified, uncomplicated: Secondary | ICD-10-CM

## 2019-03-06 DIAGNOSIS — Z0000 Encounter for general adult medical examination without abnormal findings: Secondary | ICD-10-CM | POA: Diagnosis not present

## 2019-03-06 DIAGNOSIS — Z1159 Encounter for screening for other viral diseases: Secondary | ICD-10-CM

## 2019-03-06 DIAGNOSIS — N951 Menopausal and female climacteric states: Secondary | ICD-10-CM

## 2019-03-06 DIAGNOSIS — R21 Rash and other nonspecific skin eruption: Secondary | ICD-10-CM

## 2019-03-06 DIAGNOSIS — Z1231 Encounter for screening mammogram for malignant neoplasm of breast: Secondary | ICD-10-CM | POA: Insufficient documentation

## 2019-03-06 DIAGNOSIS — K219 Gastro-esophageal reflux disease without esophagitis: Secondary | ICD-10-CM

## 2019-03-06 HISTORY — DX: Menopausal and female climacteric states: N95.1

## 2019-03-06 HISTORY — DX: Psoriasis, unspecified: L40.9

## 2019-03-06 LAB — POCT GLYCOSYLATED HEMOGLOBIN (HGB A1C): Hemoglobin A1C: 6.1 % — AB (ref 4.0–5.6)

## 2019-03-06 MED ORDER — AMLODIPINE BESYLATE 5 MG PO TABS: 5.0000 mg | ORAL_TABLET | Freq: Every day | ORAL | 3 refills | Status: DC

## 2019-03-06 MED ORDER — CLOBETASOL PROPIONATE 0.05 % EX OINT: TOPICAL_OINTMENT | Freq: Two times a day (BID) | CUTANEOUS | Status: DC

## 2019-03-06 NOTE — Assessment & Plan Note (Signed)
Patient states that she is approaching quit date for cessation.  She will try to cut back on the number cigarettes she smokes per day, she currently smokes 4 to 5 cigarettes/day.  She plans to set a quit date for the new year 2021. -Will continue to assess readiness to quit at each visit

## 2019-03-06 NOTE — Progress Notes (Signed)
Subjective:    Patient ID: Annalyn Blecher Rosko, female    DOB: 31-Aug-1965, 53 y.o.   MRN: [id number]   CC: Establish care, annual physical  HPI: Ms. Donatelli is a 53 year old woman senting today to establish care as well as for an annual physical.  She reports that she has hypertension, is not currently on a medication but has taken many in the past.  She reports that right now she has no pain, but she does occasionally get left arm numbness and tingling with squeezing pressure occasionally.  When this occurs she sometimes gets nauseous, diaphoretic, and can vomit.  This pain occurs intermittently, both at rest and on exertion.  She has had this pain off and on for over a decade, unlikely to be due to an acute process.  She also occasionally has headaches with changes in vision, she reports this is when her blood pressure is high "like in the 200s."  She denies fever, chills, shortness of breath, diarrhea, urinary problems.  She also notes a rash on her right leg that has been present for greater than 2 years.  It does not cause her any pain, but can become particularly itchy when it is very dry.  It comes and goes, currently it is on the lateral aspect of her leg, but can be pleasant on her elbows and knees.  Her granddaughter has psoriasis of the scalp, and she believes this is psoriasis.  Patient states that she is a current smoker.  She smokes 4 to 5 cigarettes/day for the last 40 years.  She states that she is interested in quitting smoking, but not ready yet.  She wants to set a quit date for the new year.  She is also interested in medication management of tobacco cessation.  She reports that she occasionally is woken up at night with heartburn, she sometimes has a bad taste in her mouth, and heartburn will sometimes make her vomit.  Discussed staying away from trigger foods like spicy foods, placing a pillow under her head at night, and avoiding eating close to bedtime.  Patient works at [employer]  in [employer] she lives with her daughter and 3 grandchildren: 64-year-old, 49 year old and 54 year old.  She does not use recreational drugs, drinks socially does not drink more than 2 drinks in any sitting, she is sexually active with men and uses condoms for protection.  Her last period was Christmas 2019.  Her last Pap smear and mammogram were in 2008.  She has never had a colonoscopy.  Smoking status reviewed   ROS: pertinent noted in the HPI   Past medical history, surgical, family, and social history reviewed and updated in the EMR as appropriate.  Objective:  BP (!) 154/98   Pulse 85   Ht 5' (1.524 m)   Wt 228 lb (103.4 kg)   SpO2 95%   BMI 44.53 kg/m   Vitals and nursing note reviewed  General: NAD, pleasant, obese woman, able to participate in exam Cardiac: RRR, S1 S2 present. normal heart sounds, no murmurs. Respiratory: CTAB, normal effort, No wheezes, rales or rhonchi Extremities: no edema or cyanosis.   RLE: Multiple large flaking plaques on anterior lateral aspect of right shin  UEs: smaller (1 cm) similar flaking plaques on flexor surfaces of bilateral elbows Skin: warm and dry, no rashes noted Neuro: alert, no obvious focal deficits Psych: Normal affect and mood       Assessment & Plan:  Ms. Suppa is a 53 year old woman  with history of hypertension, chest pain, GERD, rash that is most likely psoriasis.  TOBACCO DEPENDENCE Patient states that she is approaching quit date for cessation.  She will try to cut back on the number cigarettes she smokes per day, she currently smokes 4 to 5 cigarettes/day.  She plans to set a quit date for the new year 2021. -Will continue to assess readiness to quit at each visit  HYPERTENSION, BENIGN SYSTEMIC Patient is hypertensive to 154/98 today.  She reports having occasional headaches, changes in vision, symptomatic dizziness from hypertension. -If these symptoms of hypertension do not resolve quickly within minutes,  instructed patient to seek immediate medical care in the emergency department -Started amlodipine 5 mg daily -Follow-up in 2 weeks  Esophageal Reflux Patient described classic GERD symptoms, she currently eats trigger foods such as spicy foods. -Discussed stopping eating trigger foods -Recommended elevating head at nighttime with multiple pillows -Recommended not eating within 3 hours of bedtime -We will reassess in 2 weeks  CHEST PAIN History indicative of unstable angina for more than a decade, possibly anemia.  Reports stress test in 2012 with normal results. -EKG obtained today, normal sinus rhythm with nonspecific T wave abnormality in the lateral leads, improved since last EKG -Currently patient is not having chest pain -Instructed her that she has chest pain that does not go away after a few moments to be assessed in the ED soon as possible -Risk stratification labs obtained: A1c, lipid panel, CBC, CMP for possible statin initiation -Follow-up in 2 weeks  Perimenopausal -Last menstrual cycle Christmas 2019  Encounter for screening for HIV -HIV test ordered  Encounter for hepatitis C screening test for low risk patient -Hep C antibody ordered  Rash and nonspecific skin eruption Silvery plaques on anterior lateral aspect of shin and flexor extensor surfaces of elbows most likely to be psoriasis -Treat with clobetasol ointment twice daily x1 week -Make appointment in dermatology clinic -Rule out syphilis, obtain RPR  Breast cancer screening by mammogram -Referred for mammogram  Colon cancer screening -Referred for colonoscopy  Gladys Damme, MD [facility] PGY-1

## 2019-03-06 NOTE — Assessment & Plan Note (Signed)
-  Last menstrual cycle Christmas 2019

## 2019-03-06 NOTE — Assessment & Plan Note (Signed)
Patient described classic GERD symptoms, she currently eats trigger foods such as spicy foods. -Discussed stopping eating trigger foods -Recommended elevating head at nighttime with multiple pillows -Recommended not eating within 3 hours of bedtime -We will reassess in 2 weeks

## 2019-03-06 NOTE — Assessment & Plan Note (Signed)
Silvery plaques on anterior lateral aspect of shin and flexor extensor surfaces of elbows most likely to be psoriasis -Treat with clobetasol ointment twice daily x1 week -Make appointment in dermatology clinic -Rule out syphilis, obtain RPR

## 2019-03-06 NOTE — Assessment & Plan Note (Signed)
HIV test ordered.

## 2019-03-06 NOTE — Assessment & Plan Note (Signed)
Referred for mammogram 

## 2019-03-06 NOTE — Assessment & Plan Note (Signed)
Patient is hypertensive to 154/98 today.  She reports having occasional headaches, changes in vision, symptomatic dizziness from hypertension. -If these symptoms of hypertension do not resolve quickly within minutes, instructed patient to seek immediate medical care in the emergency department -Started amlodipine 5 mg daily -Follow-up in 2 weeks

## 2019-03-06 NOTE — Patient Instructions (Signed)
It was a pleasure to see you today!  We discussed chest pain, high blood pressure, and your rash.   1. For chest/arm pain: please go to the emergency department if your chest/arm pain does not relieve within 5 minutes, you have trouble breathing, or lose consciousness.  2. For your rash: please apply the clobetasol oint 2 times per day for a week  3. For your high blood pressure, please take norvasc 5 mg at night. IF you have a head ache, pain, or change in vision that does not improve within a few minutes, please go to the emergency department.  4. I have made referrals for mammography and colonoscopy. They will call you.  Follow up in 2 weeks.  Be Well!  Dr. Leary Roca   Angina  Angina is very bad discomfort or pain in the chest, neck, arm, jaw, or back. The discomfort is caused by a lack of blood in the middle layer of the heart wall (myocardium). What are the causes? This condition is caused by a buildup of fat and cholesterol (plaque) in your arteries (atherosclerosis). This buildup narrows the arteries and makes it hard for blood to flow. What increases the risk? You are more likely to develop this condition if:  You have high levels of cholesterol in your blood.  You have high blood pressure (hypertension).  You have diabetes.  You have a family history of heart disease.  You are not active, or you do not exercise enough.  You feel sad (depressed).  You have been treated with high energy rays (radiation) on the left side of your chest. Other risk factors are:  Using tobacco.  Being very overweight (obese).  Eating a diet high in unhealthy fats (saturated fats).  Having stress, or being exposed to things that cause stress.  Using drugs, such as cocaine. Women have a greater risk for angina if:  They are older than 77.  They have stopped having their period (are in postmenopause). What are the signs or symptoms? Common symptoms of this condition in both men  and women may include:  Chest pain, which may: ? Feel like a crushing or squeezing in the chest. ? Feel like a tightness, pressure, fullness, or heaviness in the chest. ? Last for more than a few minutes at a time. ? Stop and come back (recur) after a few minutes.  Pain in the neck, arm, jaw, or back.  Heartburn or upset stomach (indigestion) for no reason.  Being short of breath.  Feeling sick to your stomach (nauseous).  Sudden cold sweats. Women and people with diabetes may have other symptoms that are not usual, such as feeling:  Tired (fatigue).  Worried or nervous (anxious) for no reason.  Weak for no reason.  Dizzy or passing out (fainting). How is this treated? This condition may be treated with:  Medicines. These are given to: ? Prevent blood clots. ? Prevent heart attack. ? Relax blood vessels and improve blood flow to the heart (nitrates). ? Reduce blood pressure. ? Improve the pumping action of the heart. ? Reduce fat and cholesterol in the blood.  A procedure to widen a narrowed or blocked artery in the heart (angioplasty).  Surgery to allow blood to go around a blocked artery (coronary artery bypass surgery). Follow these instructions at home: Medicines  Take over-the-counter and prescription medicines only as told by your doctor.  Do not take these medicines unless your doctor says that you can: ? NSAIDs. These include:  Ibuprofen.  Naproxen. ? Vitamin supplements that have vitamin A, vitamin E, or both. ? Hormone therapy that contains estrogen with or without progestin. Eating and drinking   Eat a heart-healthy diet that includes: ? Lots of fresh fruits and vegetables. ? Whole grains. ? Low-fat (lean) protein. ? Low-fat dairy products.  Follow instructions from your doctor about what you cannot eat or drink. Activity  Follow an exercise program that your doctor tells you.  Talk with your doctor about joining a program to help improve  the health of your heart (cardiac rehab).  When you feel tired, take a break. Plan breaks if you know you are going to feel tired. Lifestyle   Do not use any products that contain nicotine or tobacco. This includes cigarettes, e-cigarettes, and chewing tobacco. If you need help quitting, ask your doctor.  If your doctor says you can drink alcohol: ? Limit how much you use to:  0-1 drink a day for women who are not pregnant.  0-2 drinks a day for men. ? Be aware of how much alcohol is in your drink. In the [place] one drink equals:  One 12 oz bottle of beer (355 mL).  One 5 oz glass of wine (148 mL).  One 1 oz glass of hard liquor (44 mL). General instructions  Stay at a healthy weight. If your doctor tells you to do so, work with him or her to lose weight.  Learn to deal with stress. If you need help, ask your doctor.  Keep your vaccines up to date. Get a flu shot every year.  Talk with your doctor if you feel sad. Take a screening test to see if you are at risk for depression.  Work with your doctor to manage any other health problems that you have. These may include diabetes or high blood pressure.  Keep all follow-up visits as told by your doctor. This is important. Get help right away if:  You have pain in your chest, neck, arm, jaw, or back, and the pain: ? Lasts more than a few minutes. ? Comes back. ? Does not get better after you take medicine under your tongue (sublingual nitroglycerin). ? Keeps getting worse. ? Comes more often.  You have any of these problems for no reason: ? Sweating a lot. ? Heartburn or upset stomach. ? Shortness of breath. ? Trouble breathing. ? Feeling sick to your stomach. ? Throwing up (vomiting). ? Feeling more tired than normal. ? Feeling nervous or worrying more than normal. ? Weakness.  You are suddenly dizzy or light-headed.  You pass out. These symptoms may be an emergency. Do not wait to see if the symptoms will go  away. Get medical help right away. Call your local emergency services (911 in the [place] Do not drive yourself to the hospital. Summary  Angina is very bad discomfort or pain in the chest, neck, arm, neck, or back.  Symptoms include chest pain, heartburn or upset stomach for no reason, and shortness of breath.  Women or people with diabetes may have symptoms that are not usual, such as feeling nervous or worried for no reason, weak for no reason, or tired.  Take all medicines only as told by your doctor.  You should eat a heart-healthy diet and follow an exercise program. This information is not intended to replace advice given to you by your health care provider. Make sure you discuss any questions you have with your health care provider. Document Released: 11/08/2007 Document Revised: 01/07/2018  Document Reviewed: 01/07/2018 Elsevier Patient Education  [employer]2020 [employer] Inc[employer]

## 2019-03-06 NOTE — Assessment & Plan Note (Signed)
Referred for colonoscopy

## 2019-03-06 NOTE — Assessment & Plan Note (Signed)
Plan to perform pap smear at next appointment

## 2019-03-06 NOTE — Assessment & Plan Note (Signed)
Hep C antibody ordered.  

## 2019-03-06 NOTE — Telephone Encounter (Signed)
Patient calls nurse line stating clobetasol was not ready at her pharmacy when she went to pick up Amlodipine. Looks like clobetasol was ordered as a "clinic admin." I called [facility] and gave verbal for Clobetasol.

## 2019-03-06 NOTE — Assessment & Plan Note (Signed)
History indicative of unstable angina for more than a decade, possibly anemia.  Reports stress test in 2012 with normal results. -EKG obtained today, normal sinus rhythm with nonspecific T wave abnormality in the lateral leads, improved since last EKG -Currently patient is not having chest pain -Instructed her that she has chest pain that does not go away after a few moments to be assessed in the ED soon as possible -Risk stratification labs obtained: A1c, lipid panel, CBC, CMP for possible statin initiation -Follow-up in 2 weeks

## 2019-03-07 LAB — CBC WITH DIFFERENTIAL/PLATELET
Basophils Absolute: 0.1 10*3/uL (ref 0.0–0.2)
Basos: 1 %
EOS (ABSOLUTE): 0.2 10*3/uL (ref 0.0–0.4)
Eos: 1 %
Hematocrit: 44.3 % (ref 34.0–46.6)
Hemoglobin: 13.5 g/dL (ref 11.1–15.9)
Immature Grans (Abs): 0 10*3/uL (ref 0.0–0.1)
Immature Granulocytes: 0 %
Lymphocytes Absolute: 2.2 10*3/uL (ref 0.7–3.1)
Lymphs: 21 %
MCH: 23.4 pg — ABNORMAL LOW (ref 26.6–33.0)
MCHC: 30.5 g/dL — ABNORMAL LOW (ref 31.5–35.7)
MCV: 77 fL — ABNORMAL LOW (ref 79–97)
Monocytes Absolute: 0.4 10*3/uL (ref 0.1–0.9)
Monocytes: 4 %
Neutrophils Absolute: 7.6 10*3/uL — ABNORMAL HIGH (ref 1.4–7.0)
Neutrophils: 73 %
Platelets: 308 10*3/uL (ref 150–450)
RBC: 5.78 x10E6/uL — ABNORMAL HIGH (ref 3.77–5.28)
RDW: 14.4 % (ref 11.7–15.4)
WBC: 10.4 10*3/uL (ref 3.4–10.8)

## 2019-03-07 LAB — LIPID PANEL
Chol/HDL Ratio: 3.5 ratio (ref 0.0–4.4)
Cholesterol, Total: 152 mg/dL (ref 100–199)
HDL: 44 mg/dL (ref >39)
LDL Chol Calc (NIH): 93 mg/dL (ref 0–99)
Triglycerides: 75 mg/dL (ref 0–149)
VLDL Cholesterol Cal: 15 mg/dL (ref 5–40)

## 2019-03-07 LAB — HIV ANTIBODY (ROUTINE TESTING W REFLEX): HIV Screen 4th Generation wRfx: NONREACTIVE

## 2019-03-07 LAB — BASIC METABOLIC PANEL
BUN/Creatinine Ratio: 16 (ref 9–23)
BUN: 18 mg/dL (ref 6–24)
CO2: 30 mmol/L — ABNORMAL HIGH (ref 20–29)
Calcium: 9.1 mg/dL (ref 8.7–10.2)
Chloride: 99 mmol/L (ref 96–106)
Creatinine, Ser: 1.16 mg/dL — ABNORMAL HIGH (ref 0.57–1.00)
GFR calc Af Amer: 62 mL/min/{1.73_m2} (ref >59)
GFR calc non Af Amer: 54 mL/min/{1.73_m2} — ABNORMAL LOW (ref >59)
Glucose: 90 mg/dL (ref 65–99)
Potassium: 3.7 mmol/L (ref 3.5–5.2)
Sodium: 140 mmol/L (ref 134–144)

## 2019-03-07 LAB — HEPATIC FUNCTION PANEL
ALT: 13 IU/L (ref 0–32)
AST: 20 IU/L (ref 0–40)
Albumin: 3.8 g/dL (ref 3.8–4.9)
Alkaline Phosphatase: 127 IU/L — ABNORMAL HIGH (ref 39–117)
Bilirubin Total: 0.5 mg/dL (ref 0.0–1.2)
Bilirubin, Direct: 0.13 mg/dL (ref 0.00–0.40)
Total Protein: 7.5 g/dL (ref 6.0–8.5)

## 2019-03-07 LAB — HEPATITIS C ANTIBODY: Hep C Virus Ab: 0.2 s/co ratio (ref 0.0–0.9)

## 2019-03-07 LAB — RPR: RPR Ser Ql: NONREACTIVE

## 2019-03-12 ENCOUNTER — Encounter: Payer: Self-pay | Admitting: Gastroenterology

## 2019-03-13 NOTE — Progress Notes (Signed)
Called pt and discussed results of recent blood work. Lipid panel WNL, cr same as 2 years ago. HIV, Hep C, RPR non reactive. Alk phos is mildly elevated, will obtain GGT at next appt and consider RUQ [place].

## 2019-03-24 ENCOUNTER — Ambulatory Visit: Payer: BC Managed Care – PPO | Admitting: Family Medicine

## 2019-04-02 ENCOUNTER — Other Ambulatory Visit: Payer: Self-pay

## 2019-04-02 ENCOUNTER — Ambulatory Visit (INDEPENDENT_AMBULATORY_CARE_PROVIDER_SITE_OTHER): Payer: BC Managed Care – PPO | Admitting: Family Medicine

## 2019-04-02 ENCOUNTER — Encounter: Payer: Self-pay | Admitting: Family Medicine

## 2019-04-02 VITALS — BP 150/130 | HR 86 | Ht 60.0 in | Wt 232.5 lb

## 2019-04-02 DIAGNOSIS — R21 Rash and other nonspecific skin eruption: Secondary | ICD-10-CM

## 2019-04-02 DIAGNOSIS — R7303 Prediabetes: Secondary | ICD-10-CM

## 2019-04-02 DIAGNOSIS — I10 Essential (primary) hypertension: Secondary | ICD-10-CM

## 2019-04-02 DIAGNOSIS — Z0000 Encounter for general adult medical examination without abnormal findings: Secondary | ICD-10-CM

## 2019-04-02 MED ORDER — INDAPAMIDE 1.25 MG PO TABS: 1.2500 mg | ORAL_TABLET | Freq: Every day | ORAL | 3 refills | Status: DC

## 2019-04-02 NOTE — Progress Notes (Signed)
   Subjective:    Patient ID: Janice [name], female    DOB: 1965/09/29, 53 y.o.   MRN: [id number]   CC: HTN follow up  HPI: Pt presents today for BP follow up, she has been taking her medication regularly, she also showed me the bottle of Amlodipine, which is consistent with her statement. She denies any headaches, vision changes, problems speaking, or moving her limbs. Stated that she has had bad head aches from HTN in the past, but that was about 2 years ago. She has used HCTZ in the past, and stated that it made her get up and use the bathroom a lot at night.  She states that she wants to lose weight and is concerned about developing diabetes. She has considered doing the keto diet.  She used all of the clobetasol oint without change in the rash on her RLE. She is scheduled for the dermatology clinic on 04/17/19.  Smoking status reviewed   ROS: pertinent noted in the HPI   Past medical history, surgical, family, and social history reviewed and updated in the EMR as appropriate.  Objective:  BP (!) 150/130   Pulse 86   Ht 5' (1.524 m)   Wt 232 lb 8 oz (105.5 kg)   SpO2 96%   BMI 45.41 kg/m   Vitals and nursing note reviewed  General: NAD, pleasant, able to participate in exam Cardiac: RRR, S1 S2 present. normal heart sounds, no murmurs. Respiratory: CTAB, normal effort, No wheezes, rales or rhonchi Abdominal: soft, NT, ND, normal BS+ Extremities: no edema or cyanosis. Skin: warm and dry. Silvery, lichenified plaques present on anterolateral RLE. Neuro: alert, no obvious focal deficits Psych: Normal affect and mood  Assessment & Plan:  [name] is a 53yo woman with uncontrolled HTN, obesity, and rash on RLE.  HYPERTENSION, BENIGN SYSTEMIC Pt has history of sensitivity to losartan with a reaction of "Chest pain." She does not remember this event. For now, will avoid ACEi/ARBs and add in thiazide medication. - increase amlodpine to 10 mg qd - start indapamide 1.25 mg  qd, recommend she takes this in the morning to decrease bathroom use at night - red flag symptoms reviewed, strict return precaution/go to ED if headaches, vision changes, new problems speaking/moving limbs - Discussed how diet can help impact HTN, pt interested in seeing a nutritionist. Referral to Dr. Iver Nestle. Card given. - follow up 2-3 weeks for HTN check and med management  Prediabetes Hemoglobin A1c 6.1, increased from 2 years ago at 5.8.  Patient has morbid obesity, she is in the contemplation stage of behavior change and considering trying different diets.  Discussed ways to make healthy change at length regarding exercise and diet.  Patient states that she is attempting to walk more often.  Discussed the importance of small measurable changes and long-term sustainable changes rather than restrictive short-term dieting.  Patient is interested in seeing a nutritionist both for her hypertension and prediabetes. -Referral to Dr. Iver Nestle  Rash and nonspecific skin eruption Clobetasol oint did not help resulted in no change, despite pt using whole tube over last 1 month. - Possibly psoriasis, but also consider lichen sclerosis - Pt has dermatology clinic appt on 04/17/2019  Healthcare maintenance Pt has appt w/ GI for colonoscopy on 04/24/2019.  Pap smear at next appt.  Gladys Damme, MD [facility] PGY-1

## 2019-04-02 NOTE — Patient Instructions (Signed)
It was a pleasure to see you today!  1. I will refer you to our nutritionist, Dr. Iver Nestle, please call her number to set up an appt.  2. For your high blood pressure, start taking 2 pills of the amlodipine per day. Also take 1 pill of the indapamide, take this in the morning to reduce using the bathroom at night.  3. If you have a bad head ache, changes in vision, trouble speaking, moving a limb or your face, go directly to the Emergency department.  4. For your leg, I recommend waiting for biopsy before trying another high potency steroid.  5. Follow up in 2-3 weeks with me.  Feel better!  Dr. Chauncey Reading

## 2019-04-04 ENCOUNTER — Encounter: Payer: Self-pay | Admitting: Family Medicine

## 2019-04-04 DIAGNOSIS — R7303 Prediabetes: Secondary | ICD-10-CM | POA: Insufficient documentation

## 2019-04-04 HISTORY — DX: Prediabetes: R73.03

## 2019-04-04 NOTE — Assessment & Plan Note (Addendum)
Pt has history of sensitivity to losartan with a reaction of "Chest pain." She does not remember this event. For now, will avoid ACEi/ARBs and add in thiazide medication. - increase amlodpine to 10 mg qd - start indapamide 1.25 mg qd, recommend she takes this in the morning to decrease bathroom use at night - red flag symptoms reviewed, strict return precaution/go to ED if headaches, vision changes, new problems speaking/moving limbs - Discussed how diet can help impact HTN, pt interested in seeing a nutritionist. Referral to Dr. Iver Nestle. Card given. - follow up 2-3 weeks for HTN check and med management

## 2019-04-04 NOTE — Assessment & Plan Note (Signed)
Pt has appt w/ GI for colonoscopy on 04/24/2019.  Pap smear at next appt.

## 2019-04-04 NOTE — Assessment & Plan Note (Signed)
Hemoglobin A1c 6.1, increased from 2 years ago at 5.8.  Patient has morbid obesity, she is in the contemplation stage of behavior change and considering trying different diets.  Discussed ways to make healthy change at length regarding exercise and diet.  Patient states that she is attempting to walk more often.  Discussed the importance of small measurable changes and long-term sustainable changes rather than restrictive short-term dieting.  Patient is interested in seeing a nutritionist both for her hypertension and prediabetes. -Referral to Dr. Iver Nestle

## 2019-04-04 NOTE — Assessment & Plan Note (Signed)
Clobetasol oint did not help resulted in no change, despite pt using whole tube over last 1 month. - Possibly psoriasis, but also consider lichen sclerosis - Pt has dermatology clinic appt on 04/17/2019

## 2019-04-10 ENCOUNTER — Ambulatory Visit: Payer: BC Managed Care – PPO

## 2019-04-17 ENCOUNTER — Ambulatory Visit: Payer: BC Managed Care – PPO | Admitting: Family Medicine

## 2019-04-17 ENCOUNTER — Other Ambulatory Visit: Payer: Self-pay

## 2019-04-17 VITALS — BP 172/80 | HR 82 | Wt 232.6 lb

## 2019-04-17 DIAGNOSIS — L308 Other specified dermatitis: Secondary | ICD-10-CM | POA: Diagnosis not present

## 2019-04-17 DIAGNOSIS — R21 Rash and other nonspecific skin eruption: Secondary | ICD-10-CM

## 2019-04-17 MED ORDER — CLOBETASOL PROPIONATE 0.05 % EX OINT: 1.0000 "application " | TOPICAL_OINTMENT | Freq: Two times a day (BID) | CUTANEOUS | 2 refills | Status: DC

## 2019-04-17 NOTE — Progress Notes (Signed)
   Subjective:   Patient ID: Janice [name]    DOB: [birth year], 54 y.o. female   MRN: [id number]  Janice [name] is a 53 y.o. female here for rash on right lower extremity.  Rash on RLE: Patient notes she has had this rash on her lateral RLE x 2-3 years. It started out as a small circle size of a quarter and has grown significantly. Thought it was fungal thus treated with OTC Blue Star ointment. It appears dry and is very itchy. Was seen by [facility] and was treated with "different creams" including oral and topical antifungal Lamisil. Last treated with clobetasol 0.05% BID without improvement. She notes the cream helped the itching. Denies any personal or family history of autoimmune disorders. Denies any other systemic symptoms. Notes that it "burns" when she takes a bath/shower. Denies any joint pains or fingernail symptoms. Does endorse small patches on elbows bilaterally.  Review of Systems:  Per HPI.   [place], medications and smoking status reviewed.  Objective:   BP (!) 172/80   Pulse 82   Wt 232 lb 9.6 oz (105.5 kg)   SpO2 97%   BMI 45.43 kg/m  Vitals and nursing note reviewed.  General: pleasant older lady, sitting comfortably in exam chair, well nourished, well developed, in no acute distress with non-toxic appearance Resp: Breathing comfortably on room air, speaking in full sentences Skin: warm, dry, large well circumcised plaque with small satelite nummular lesions, lichenification on parts of plaque, scaly border with areas of erythema on edges  Extremities: warm and well perfused MSK: gait normal      Punch Biopsy of Right lateral LE: After informed written consent was obtained, using alcohol swab for cleansing and 1% Lidocaine with epinephrine for anesthetic, with sterile technique a 4 mm punch biopsy was used to obtain a biopsy specimen of the lesion. Hemostasis was obtained by pressure.Antibiotic dressing is applied, and wound care instructions provided.  Patient alert for any signs of cutaneous infection. The specimen is labeled and sent to pathology for evaluation. The procedure was well tolerated without complications.  Assessment & Plan:   Rash and nonspecific skin eruption Chronic. Large well circumcised lichenified plaque/patch with small satellite nummular lesions on right lateral lower extremity. Small patches of dry skin with scaly border on elbows bilaterally. No involvement of scalp, nails, or joints. Some improvement with topical steroid. Ddx includes psoriasis vs lichen simplex chronicus vs prurigo nodularis vs nummular eczema.  - Punch biopsy obtained as above. Will call patient with results. - Patient to continue Clobetasol 0.05% ointment BID and daily use of Vaseline to keep skin moisturized.  - Patent instructed to keep nails short and avoid scratching.  - Return precautions and daily wound care discussed.  - If patient is unable to afford the Clobetasol ointment, then can consider high potency Triamcinolone ointment as alternative.  Meds ordered this encounter  Medications  . clobetasol ointment (TEMOVATE) 0.05 %    Sig: Apply 1 application topically 2 (two) times daily.    Dispense:  60 g    Refill:  [place], DO PGY-2, [facility] 04/17/2019 5:10 PM

## 2019-04-17 NOTE — Assessment & Plan Note (Addendum)
Chronic. Large well circumcised lichenified plaque/patch with small satellite nummular lesions on right lateral lower extremity. Small patches of dry skin with scaly border on elbows bilaterally. No involvement of scalp, nails, or joints. Some improvement with topical steroid. Ddx includes psoriasis vs lichen simplex chronicus vs prurigo nodularis vs nummular eczema.  - Punch biopsy obtained as above. Will call patient with results. - Patient to continue Clobetasol 0.05% ointment BID and daily use of Vaseline to keep skin moisturized.  - Patent instructed to keep nails short and avoid scratching.  - Return precautions and daily wound care discussed.  - If patient is unable to afford the Clobetasol ointment, then can consider high potency Triamcinolone ointment as alternative.

## 2019-04-17 NOTE — Patient Instructions (Signed)
Thank you so much for coming in today.   Today we took a small biopsy of the rash on your leg. Please leave bandage on today. You can remove tomorrow, clean and apply Band-Aid with Vaseline. It should heal within 1-2 weeks. We will call you with the biopsy results.  Please use the Clobetasol twice a day every day. Please moisturize your skin at least twice a day.  Keep nails short to help with the scratching and try to avoid scratching as this will make it worse.  Please call or return if the area becomes very painful, red/hot, or if you develop any fevers/chills.  Take care, Dr. Tarry Kos

## 2019-04-24 ENCOUNTER — Encounter: Payer: BC Managed Care – PPO | Admitting: Gastroenterology

## 2019-04-25 ENCOUNTER — Telehealth: Payer: Self-pay | Admitting: Family Medicine

## 2019-04-25 NOTE — Telephone Encounter (Signed)
Attempted to contact patient to discuss biopsy results without success. LMVM for call back.  If patient calls back please inform her that her biopsy results were consistent with psoriasis as suspected. She should continue the topical steroid cream twice a day. This will likely take some time to clear up and may recur in times of stress. Once the current rash improves, she can use the steroid cream as needed. Patient may follow up for further discussion with her PCP as needed.

## 2019-05-09 ENCOUNTER — Other Ambulatory Visit: Payer: Self-pay

## 2019-05-09 ENCOUNTER — Ambulatory Visit (INDEPENDENT_AMBULATORY_CARE_PROVIDER_SITE_OTHER): Payer: BC Managed Care – PPO | Admitting: Family Medicine

## 2019-05-09 ENCOUNTER — Encounter: Payer: Self-pay | Admitting: Family Medicine

## 2019-05-09 VITALS — BP 152/124 | HR 91 | Wt 227.2 lb

## 2019-05-09 DIAGNOSIS — I10 Essential (primary) hypertension: Secondary | ICD-10-CM | POA: Diagnosis not present

## 2019-05-09 DIAGNOSIS — R748 Abnormal levels of other serum enzymes: Secondary | ICD-10-CM

## 2019-05-09 DIAGNOSIS — Z0000 Encounter for general adult medical examination without abnormal findings: Secondary | ICD-10-CM | POA: Diagnosis not present

## 2019-05-09 DIAGNOSIS — L409 Psoriasis, unspecified: Secondary | ICD-10-CM

## 2019-05-09 DIAGNOSIS — R7303 Prediabetes: Secondary | ICD-10-CM

## 2019-05-09 MED ORDER — INDAPAMIDE 2.5 MG PO TABS: 2.5000 mg | ORAL_TABLET | Freq: Every day | ORAL | 3 refills | Status: DC

## 2019-05-09 NOTE — Assessment & Plan Note (Signed)
Patient had alk phos of 127 previously.  We will recheck today with hepatic function panel as well as GGT.  Hep C antibody negative.

## 2019-05-09 NOTE — Assessment & Plan Note (Signed)
Recommend patient reschedule GI appointment for colonoscopy.  Reminded patient to schedule appointment for mammogram.  Reminded patient to call Dr. Iver Nestle for nutrition appointment.  Patient declined Pap at last appointment and this appointment, discussed that we will have to do a Pap sometime this year.  Patient declined flu shot at this visit, she was counseled on importance of flu vaccine.

## 2019-05-09 NOTE — Progress Notes (Signed)
Subjective:    Patient ID: Janice [name], female    DOB: 1965/11/14, 53 y.o.   MRN: [id number]   CC: rash, HTN, liver numbers  HPI: Rash: patient recently was seen in the dermatology clinic of this office and had a biopsy which confirmed the suspected diagnosis of psoriasis. She has been using clobetasol 0.05% cr BID, and is beginning to see an improvement in the diminished thickness of plaques and scaling.  HTN: Patient has improved hypertension today 152/124.  She was confused about the directions for taking medications at the last visit.  She is only been taking indapamide 1.25 mg daily.  She was unsure what to do with the amlodipine.  Healthcare maintenance: Patient had to cancel GI visit for colonoscopy due to to change in living circumstances and family problems.  She will reschedule this appointment, as well as schedule her mammogram.  She declines a Pap smear today, we will do this in the future.  She has not yet called Dr. Jenne Campus.  Elevated alkaline phosphatase: October patient had elevated alk phos to 127.  She was tested for hepatitis C antibody and was negative.  Smoking status reviewed   ROS: pertinent noted in the HPI   Past Medical History:  Diagnosis Date  . Depression    history of  . GERD (gastroesophageal reflux disease)    acid reflux at night and with spicy foods  . Hypertension   . Morbid obesity with BMI of 40.0-44.9, adult [facility])     Past Surgical History:  Procedure Laterality Date  . CESAREAN SECTION     x2    Past medical history, surgical, family, and social history reviewed and updated in the EMR as appropriate.  Objective:  BP (!) 152/124   Pulse 91   Wt 227 lb 3.2 oz (103.1 kg)   SpO2 96%   BMI 44.37 kg/m   Vitals and nursing note reviewed  General: NAD, pleasant, overweight, middle-aged woman, well groomed, able to participate in exam Cardiac: RRR, S1 S2 present. normal heart sounds, no murmurs. Respiratory: CTAB, normal effort, No  wheezes, rales or rhonchi Extremities: no edema or cyanosis. Skin: warm and dry, decreased scaling and plaque size noted on psoriasis of R leg Neuro: alert, no obvious focal deficits Psych: Normal affect and mood      Assessment & Plan:  Ms. Shaub is a 53 yo woman here for HTN, elevated alkaline phosphatase, pre-diabetes, and psoriasis.  Psoriasis Diminished thickness noted of plaques on right leg with use of clobetasol 0.05% cream.  Plan to continue use, and reevaluate at follow-up appointment next month.  If no significant change at next follow-up, can consider referral to dermatology for biologic use.  HYPERTENSION, BENIGN SYSTEMIC Patient with somewhat improved blood pressure today 152/124.  Plan to increase indapamide to 2.5 mg daily, recommend taking this pill in the morning to decrease micturition at night.  Continue amlodipine 10 mg daily.  - Follow-up 1 month for BP check and med management - Check kidney function today after 1 month of thiazide  Healthcare maintenance Recommend patient reschedule GI appointment for colonoscopy.  Reminded patient to schedule appointment for mammogram.  Reminded patient to call Dr. Iver Nestle for nutrition appointment.  Patient declined Pap at last appointment and this appointment, discussed that we will have to do a Pap sometime this year.  Patient declined flu shot at this visit, she was counseled on importance of flu vaccine.  Prediabetes Next A1c check in February 2021.  Elevated alkaline phosphatase level Patient had alk phos of 127 previously.  We will recheck today with hepatic function panel as well as GGT.  Hep C antibody negative.   Gladys Damme, MD [facility] PGY-1

## 2019-05-09 NOTE — Assessment & Plan Note (Signed)
Diminished thickness noted of plaques on right leg with use of clobetasol 0.05% cream.  Plan to continue use, and reevaluate at follow-up appointment next month.  If no significant change at next follow-up, can consider referral to dermatology for biologic use.

## 2019-05-09 NOTE — Assessment & Plan Note (Signed)
Next A1c check in February 2021.

## 2019-05-09 NOTE — Patient Instructions (Addendum)
It was a pleasure seeing you today! You are doing a good job to improve your health, keep up the good work :)  Healthcare maintenance to do list: 1. Reschedule colonoscopy appointment with GI 2. Schedule mammogram 3. Do a pap smear within the next few months 4. Call Dr. Wyona Almas to schedule nutrition appointment  Blood pressure:  1. Take indapamide 2.5 mg daily (take in the morning to decrease amount of urination at night) 2. Take amlodipine 10 mg daily (take two 5 mg pills for a total of 10mg  per day). When you run out of 5mg  pills, I can call in an order for 10 mg so you only have to take 2 pills (one for each medication) per day.  I am checking your liver function today as well as kidney function after starting a new medication.  If any questions, please reach out. Follow up in 1 month for a blood pressure check.  Be Well,  Dr.  Managing Your Hypertension Hypertension is commonly called high blood pressure. This is when the force of your blood pressing against the walls of your arteries is too strong. Arteries are blood vessels that carry blood from your heart throughout your body. Hypertension forces the heart to work harder to pump blood, and may cause the arteries to become narrow or stiff. Having untreated or uncontrolled hypertension can cause heart attack, stroke, kidney disease, and other problems. What are blood pressure readings? A blood pressure reading consists of a higher number over a lower number. Ideally, your blood pressure should be below 120/80. The first ("top") number is called the systolic pressure. It is a measure of the pressure in your arteries as your heart beats. The second ("bottom") number is called the diastolic pressure. It is a measure of the pressure in your arteries as the heart relaxes. What does my blood pressure reading mean? Blood pressure is classified into four stages. Based on your blood pressure reading, your health care provider may  use the following stages to determine what type of treatment you need, if any. Systolic pressure and diastolic pressure are measured in a unit called mm Hg. Normal  Systolic pressure: below 120.  Diastolic pressure: below 80. Elevated  Systolic pressure: 120-129.  Diastolic pressure: below 80. Hypertension stage 1  Systolic pressure: 130-139.  Diastolic pressure: 80-89. Hypertension stage 2  Systolic pressure: 140 or above.  Diastolic pressure: 90 or above. What health risks are associated with hypertension? Managing your hypertension is an important responsibility. Uncontrolled hypertension can lead to:  A heart attack.  A stroke.  A weakened blood vessel (aneurysm).  Heart failure.  Kidney damage.  Eye damage.  Metabolic syndrome.  Memory and concentration problems. What changes can I make to manage my hypertension? Hypertension can be managed by making lifestyle changes and possibly by taking medicines. Your health care provider will help you make a plan to bring your blood pressure within a normal range. Eating and drinking   Eat a diet that is high in fiber and potassium, and low in salt (sodium), added sugar, and fat. An example eating plan is called the DASH (Dietary Approaches to Stop Hypertension) diet. To eat this way: ? Eat plenty of fresh fruits and vegetables. Try to fill half of your plate at each meal with fruits and vegetables. ? Eat whole grains, such as whole wheat pasta, brown rice, or whole grain bread. Fill about one quarter of your plate with whole grains. ? Eat low-fat diary products. ?  Avoid fatty cuts of meat, processed or cured meats, and poultry with skin. Fill about one quarter of your plate with lean proteins such as fish, chicken without skin, beans, eggs, and tofu. ? Avoid premade and processed foods. These tend to be higher in sodium, added sugar, and fat.  Reduce your daily sodium intake. Most people with hypertension should eat less  than 1,500 mg of sodium a day.  Limit alcohol intake to no more than 1 drink a day for nonpregnant women and 2 drinks a day for men. One drink equals 12 oz of beer, 5 oz of wine, or 1 oz of hard liquor. Lifestyle  Work with your health care provider to maintain a healthy body weight, or to lose weight. Ask what an ideal weight is for you.  Get at least 30 minutes of exercise that causes your heart to beat faster (aerobic exercise) most days of the week. Activities may include walking, swimming, or biking.  Include exercise to strengthen your muscles (resistance exercise), such as weight lifting, as part of your weekly exercise routine. Try to do these types of exercises for 30 minutes at least 3 days a week.  Do not use any products that contain nicotine or tobacco, such as cigarettes and e-cigarettes. If you need help quitting, ask your health care provider.  Control any long-term (chronic) conditions you have, such as high cholesterol or diabetes. Monitoring  Monitor your blood pressure at home as told by your health care provider. Your personal target blood pressure may vary depending on your medical conditions, your age, and other factors.  Have your blood pressure checked regularly, as often as told by your health care provider. Working with your health care provider  Review all the medicines you take with your health care provider because there may be side effects or interactions.  Talk with your health care provider about your diet, exercise habits, and other lifestyle factors that may be contributing to hypertension.  Visit your health care provider regularly. Your health care provider can help you create and adjust your plan for managing hypertension. Will I need medicine to control my blood pressure? Your health care provider may prescribe medicine if lifestyle changes are not enough to get your blood pressure under control, and if:  Your systolic blood pressure is 130 or  higher.  Your diastolic blood pressure is 80 or higher. Take medicines only as told by your health care provider. Follow the directions carefully. Blood pressure medicines must be taken as prescribed. The medicine does not work as well when you skip doses. Skipping doses also puts you at risk for problems. Contact a health care provider if:  You think you are having a reaction to medicines you have taken.  You have repeated (recurrent) headaches.  You feel dizzy.  You have swelling in your ankles.  You have trouble with your vision. Get help right away if:  You develop a severe headache or confusion.  You have unusual weakness or numbness, or you feel faint.  You have severe pain in your chest or abdomen.  You vomit repeatedly.  You have trouble breathing. Summary  Hypertension is when the force of blood pumping through your arteries is too strong. If this condition is not controlled, it may put you at risk for serious complications.  Your personal target blood pressure may vary depending on your medical conditions, your age, and other factors. For most people, a normal blood pressure is less than 120/80.  Hypertension is  managed by lifestyle changes, medicines, or both. Lifestyle changes include weight loss, eating a healthy, low-sodium diet, exercising more, and limiting alcohol. This information is not intended to replace advice given to you by your health care provider. Make sure you discuss any questions you have with your health care provider. Document Released: 02/14/2012 Document Revised: 09/13/2018 Document Reviewed: 04/19/2016 Elsevier Patient Education  2020 [employer]

## 2019-05-09 NOTE — Assessment & Plan Note (Addendum)
Patient with somewhat improved blood pressure today 152/124.  Plan to increase indapamide to 2.5 mg daily, recommend taking this pill in the morning to decrease micturition at night.  Continue amlodipine 10 mg daily.  - Follow-up 1 month for BP check and med management - Check kidney function today after 1 month of thiazide

## 2019-05-10 LAB — COMPREHENSIVE METABOLIC PANEL
ALT: 14 IU/L (ref 0–32)
AST: 20 IU/L (ref 0–40)
Albumin/Globulin Ratio: 1 — ABNORMAL LOW (ref 1.2–2.2)
Albumin: 3.7 g/dL — ABNORMAL LOW (ref 3.8–4.9)
Alkaline Phosphatase: 126 IU/L — ABNORMAL HIGH (ref 39–117)
BUN/Creatinine Ratio: 12 (ref 9–23)
BUN: 14 mg/dL (ref 6–24)
Bilirubin Total: 0.7 mg/dL (ref 0.0–1.2)
CO2: 30 mmol/L — ABNORMAL HIGH (ref 20–29)
Calcium: 9.3 mg/dL (ref 8.7–10.2)
Chloride: 98 mmol/L (ref 96–106)
Creatinine, Ser: 1.19 mg/dL — ABNORMAL HIGH (ref 0.57–1.00)
GFR calc Af Amer: 60 mL/min/{1.73_m2} (ref >59)
GFR calc non Af Amer: 52 mL/min/{1.73_m2} — ABNORMAL LOW (ref >59)
Globulin, Total: 3.6 g/dL (ref 1.5–4.5)
Glucose: 90 mg/dL (ref 65–99)
Potassium: 3.2 mmol/L — ABNORMAL LOW (ref 3.5–5.2)
Sodium: 143 mmol/L (ref 134–144)
Total Protein: 7.3 g/dL (ref 6.0–8.5)

## 2019-05-10 LAB — GAMMA GT: GGT: 9 IU/L (ref 0–60)

## 2019-06-12 ENCOUNTER — Ambulatory Visit: Payer: BC Managed Care – PPO | Admitting: Family Medicine

## 2019-06-19 ENCOUNTER — Ambulatory Visit: Payer: BC Managed Care – PPO | Admitting: Family Medicine

## 2019-06-30 ENCOUNTER — Ambulatory Visit: Payer: BC Managed Care – PPO | Admitting: Family Medicine

## 2019-09-24 ENCOUNTER — Ambulatory Visit: Payer: BC Managed Care – PPO | Admitting: Family Medicine

## 2019-10-15 ENCOUNTER — Ambulatory Visit: Payer: BC Managed Care – PPO | Admitting: Family Medicine

## 2019-10-15 ENCOUNTER — Encounter: Payer: Self-pay | Admitting: Family Medicine

## 2019-10-15 ENCOUNTER — Other Ambulatory Visit: Payer: Self-pay

## 2019-10-15 VITALS — BP 142/96 | HR 88 | Ht 60.0 in | Wt 218.0 lb

## 2019-10-15 DIAGNOSIS — I10 Essential (primary) hypertension: Secondary | ICD-10-CM | POA: Diagnosis not present

## 2019-10-15 DIAGNOSIS — L409 Psoriasis, unspecified: Secondary | ICD-10-CM | POA: Diagnosis not present

## 2019-10-15 MED ORDER — INDAPAMIDE 2.5 MG PO TABS: 5.0000 mg | ORAL_TABLET | Freq: Every day | ORAL | 0 refills | Status: DC

## 2019-10-15 MED ORDER — ADAPALENE 0.1 % EX GEL: Freq: Every day | CUTANEOUS | 0 refills | Status: DC

## 2019-10-15 MED ORDER — AMLODIPINE BESYLATE 5 MG PO TABS: 5.0000 mg | ORAL_TABLET | Freq: Every day | ORAL | 3 refills | Status: DC

## 2019-10-15 NOTE — Progress Notes (Signed)
   SUBJECTIVE:   CHIEF COMPLAINT / HPI:   Psoriasis: Present on legs, worse on R as well as BL elbows. Confirmed on biopsy. Patient reports she was trying the clobetasol ointment with no improvement and believes it may have worsened. She has also been avoid sun exposure and using emollients.   Hypertension: - Medications: patient has been taking 2 pills, that are both indapamide 2.5mg , so she has been getting 5mg , and has not been taking amlodipine due to pills being different color from the pharamcy - Checking BP at home: no - Denies any SOB, CP, vision changes, LE edema, medication SEs, or symptoms of hypotension  PERTINENT  PMH / PSH: HTN, psoriasis  OBJECTIVE:  BP (!) 142/96   Pulse 88   Ht 5' (1.524 m)   Wt 218 lb (98.9 kg)   SpO2 98%   BMI 42.58 kg/m   General: NAD, pleasant Neck: Supple Cardiovascular: RRR, no m/r/g, no LE edema Respiratory: CTA BL, normal work of breathing Skin: thickened hyperpigmented scaling skin noted below on R leg     ASSESSMENT/PLAN:   No problem-specific Assessment & Plan notes found for this encounter.    Dalesha Stanback, DO PGY-3, [facility]

## 2019-10-15 NOTE — Patient Instructions (Addendum)
Thank you for coming to see me today. It was a pleasure! Today we talked about:   A normal or good blood pressure is <140/<90.  Please continue taking your blood pressure at home. Please also continue taking indapamide 5 mg as you have been.  We will also start the medication amlodipine 5 mg.   I have sent a cream to your pharmacy called Differin gel which may help with your psoriasis.  Please apply to the area on your leg and arms prior to going to bed.  It is important while using this medication that you try to avoid sunlight and use plenty of sunscreen on the area 3 use this gel.  [email or web address]   Please follow-up with our office 4-6 weeks for a blood pressure check or sooner as needed.  If you have any questions or concerns, please do not hesitate to call the office at [phone].  Take Care,   [place] Arnet Hofferber, DO

## 2019-10-16 ENCOUNTER — Telehealth: Payer: Self-pay | Admitting: *Deleted

## 2019-10-16 LAB — BASIC METABOLIC PANEL WITH GFR
BUN/Creatinine Ratio: 15 (ref 9–23)
BUN: 16 mg/dL (ref 6–24)
CO2: 33 mmol/L — ABNORMAL HIGH (ref 20–29)
Calcium: 9.6 mg/dL (ref 8.7–10.2)
Chloride: 94 mmol/L — ABNORMAL LOW (ref 96–106)
Creatinine, Ser: 1.1 mg/dL — ABNORMAL HIGH (ref 0.57–1.00)
GFR calc Af Amer: 66 mL/min/{1.73_m2}
GFR calc non Af Amer: 57 mL/min/{1.73_m2} — ABNORMAL LOW
Glucose: 100 mg/dL — ABNORMAL HIGH (ref 65–99)
Potassium: 3.1 mmol/L — ABNORMAL LOW (ref 3.5–5.2)
Sodium: 142 mmol/L (ref 134–144)

## 2019-10-16 LAB — HEMOGLOBIN A1C
Est. average glucose Bld gHb Est-mCnc: 126 mg/dL
Hgb A1c MFr Bld: 6 % — ABNORMAL HIGH (ref 4.8–5.6)

## 2019-10-16 NOTE — Telephone Encounter (Signed)
Fax received from pharmacy stating that adapalene 0.1% gel is not covered by this patient's insurance.  Please send in another medication.  Cadyn Rodger,CMA

## 2019-10-17 MED ORDER — TAZAROTENE 0.05 % EX GEL: Freq: Every day | CUTANEOUS | 0 refills | Status: DC

## 2019-10-17 NOTE — Telephone Encounter (Signed)
Alternative sent

## 2019-10-21 NOTE — Assessment & Plan Note (Signed)
Given that clobetasol is not making much improvement, will refer to dermatology. In the meantime, will trial a retinoid cream and patient counseled to avoid the sun.

## 2019-10-21 NOTE — Assessment & Plan Note (Addendum)
BP elevated at 142/96 and patient has been taking indapamide 5mg  without amlodipine due to mistaking the pills. Previously reported losartan caused chest pain so will avoid adding ACEi/ARB at this time.  - Will initiate amlodipine 5mg  in addition to maintaining current dose of indapamide at 5 mg.  - return in 4-6 weeks for BP check and patient to check at home as she just received a new cuff  BMP with low potassium, will call patient and initiate K supplement while on indapamide, return in 1-2 weeks for follow up BMP and BP check. Patient asymptomatic.

## 2019-10-30 ENCOUNTER — Other Ambulatory Visit (HOSPITAL_COMMUNITY)
Admission: RE | Admit: 2019-10-30 | Discharge: 2019-10-30 | Disposition: A | Discharge status: Home / Self Care | Payer: BC Managed Care – PPO | Source: Ambulatory Visit | Attending: Family Medicine | Admitting: Family Medicine

## 2019-10-30 ENCOUNTER — Encounter: Payer: Self-pay | Admitting: Family Medicine

## 2019-10-30 ENCOUNTER — Ambulatory Visit (INDEPENDENT_AMBULATORY_CARE_PROVIDER_SITE_OTHER): Payer: BC Managed Care – PPO | Admitting: Family Medicine

## 2019-10-30 ENCOUNTER — Other Ambulatory Visit: Payer: Self-pay

## 2019-10-30 VITALS — BP 172/105 | HR 91 | Temp 98.6°F | Wt 216.0 lb

## 2019-10-30 DIAGNOSIS — A599 Trichomoniasis, unspecified: Secondary | ICD-10-CM

## 2019-10-30 DIAGNOSIS — R7303 Prediabetes: Secondary | ICD-10-CM

## 2019-10-30 DIAGNOSIS — Z124 Encounter for screening for malignant neoplasm of cervix: Secondary | ICD-10-CM | POA: Diagnosis not present

## 2019-10-30 DIAGNOSIS — E876 Hypokalemia: Secondary | ICD-10-CM | POA: Diagnosis not present

## 2019-10-30 DIAGNOSIS — R8789 Other abnormal findings in specimens from female genital organs: Secondary | ICD-10-CM

## 2019-10-30 DIAGNOSIS — I10 Essential (primary) hypertension: Secondary | ICD-10-CM | POA: Diagnosis not present

## 2019-10-30 DIAGNOSIS — R87618 Other abnormal cytological findings on specimens from cervix uteri: Secondary | ICD-10-CM

## 2019-10-30 MED ORDER — INDAPAMIDE 2.5 MG PO TABS: 5.0000 mg | ORAL_TABLET | Freq: Every day | ORAL | 0 refills | Status: DC

## 2019-10-30 MED ORDER — LISINOPRIL 20 MG PO TABS: 20.0000 mg | ORAL_TABLET | Freq: Every day | ORAL | 3 refills | Status: DC

## 2019-10-30 NOTE — Patient Instructions (Addendum)
[email or web address]  Go here^ to schedule your Covid-19 shot.  It was so nice to see you today!   For your high blood pressure: - take TWO pills of indapamide (lozol) once daily - take ONE pill of amlodipine (norvasc) once daily - if you have any headache that doesn't get better with medication or changes in your vision (such as spots) call our office and go to the nearest emergency department  Call in [facility] June for a follow up appointment in 3-4 weeks.  Be Well!  Managing Your Hypertension Hypertension is commonly called high blood pressure. This is when the force of your blood pressing against the walls of your arteries is too strong. Arteries are blood vessels that carry blood from your heart throughout your body. Hypertension forces the heart to work harder to pump blood, and may cause the arteries to become narrow or stiff. Having untreated or uncontrolled hypertension can cause heart attack, stroke, kidney disease, and other problems. What are blood pressure readings? A blood pressure reading consists of a higher number over a lower number. Ideally, your blood pressure should be below 120/80. The first ("top") number is called the systolic pressure. It is a measure of the pressure in your arteries as your heart beats. The second ("bottom") number is called the diastolic pressure. It is a measure of the pressure in your arteries as the heart relaxes. What does my blood pressure reading mean? Blood pressure is classified into four stages. Based on your blood pressure reading, your health care provider may use the following stages to determine what type of treatment you need, if any. Systolic pressure and diastolic pressure are measured in a unit called mm Hg. Normal  Systolic pressure: below 932.  Diastolic pressure: below 80. Elevated  Systolic pressure: 355-732.  Diastolic pressure: below  80. Hypertension stage 1  Systolic pressure: 202-542.  Diastolic pressure: 70-62. Hypertension stage 2  Systolic pressure: 376 or above.  Diastolic pressure: 90 or above. What health risks are associated with hypertension? Managing your hypertension is an important responsibility. Uncontrolled hypertension can lead to:  A heart attack.  A stroke.  A weakened blood vessel (aneurysm).  Heart failure.  Kidney damage.  Eye damage.  Metabolic syndrome.  Memory and concentration problems. What changes can I make to manage my hypertension? Hypertension can be managed by making lifestyle changes and possibly by taking medicines. Your health care provider will help you make a plan to bring your blood pressure within a normal range. Eating and drinking   Eat a diet that is high in fiber and potassium, and low in salt (sodium), added sugar, and fat. An example eating plan is called the DASH (Dietary Approaches to Stop Hypertension) diet. To eat this way: ? Eat plenty of fresh fruits and vegetables. Try to fill half of your plate at each meal with fruits and vegetables. ? Eat whole grains, such as whole wheat pasta, brown rice, or whole grain bread. Fill about one quarter of your plate with whole grains. ? Eat low-fat diary products. ? Avoid fatty cuts of meat, processed or cured meats, and poultry with skin. Fill about one quarter of your plate with lean proteins such as fish, chicken without skin, beans, eggs, and tofu. ? Avoid premade and processed foods. These tend to be higher in sodium, added sugar, and fat.  Reduce your daily sodium intake. Most people with hypertension should eat less than 1,500 mg of sodium a day.  Limit alcohol intake to no more  than 1 drink a day for nonpregnant women and 2 drinks a day for men. One drink equals 12 oz of beer, 5 oz of wine, or 1 oz of hard liquor. Lifestyle  Work with your health care provider to maintain a healthy body weight, or to  lose weight. Ask what an ideal weight is for you.  Get at least 30 minutes of exercise that causes your heart to beat faster (aerobic exercise) most days of the week. Activities may include walking, swimming, or biking.  Include exercise to strengthen your muscles (resistance exercise), such as weight lifting, as part of your weekly exercise routine. Try to do these types of exercises for 30 minutes at least 3 days a week.  Do not use any products that contain nicotine or tobacco, such as cigarettes and e-cigarettes. If you need help quitting, ask your health care provider.  Control any long-term (chronic) conditions you have, such as high cholesterol or diabetes. Monitoring  Monitor your blood pressure at home as told by your health care provider. Your personal target blood pressure may vary depending on your medical conditions, your age, and other factors.  Have your blood pressure checked regularly, as often as told by your health care provider. Working with your health care provider  Review all the medicines you take with your health care provider because there may be side effects or interactions.  Talk with your health care provider about your diet, exercise habits, and other lifestyle factors that may be contributing to hypertension.  Visit your health care provider regularly. Your health care provider can help you create and adjust your plan for managing hypertension. Will I need medicine to control my blood pressure? Your health care provider may prescribe medicine if lifestyle changes are not enough to get your blood pressure under control, and if:  Your systolic blood pressure is 130 or higher.  Your diastolic blood pressure is 80 or higher. Take medicines only as told by your health care provider. Follow the directions carefully. Blood pressure medicines must be taken as prescribed. The medicine does not work as well when you skip doses. Skipping doses also puts you at risk for  problems. Contact a health care provider if:  You think you are having a reaction to medicines you have taken.  You have repeated (recurrent) headaches.  You feel dizzy.  You have swelling in your ankles.  You have trouble with your vision. Get help right away if:  You develop a severe headache or confusion.  You have unusual weakness or numbness, or you feel faint.  You have severe pain in your chest or abdomen.  You vomit repeatedly.  You have trouble breathing. Summary  Hypertension is when the force of blood pumping through your arteries is too strong. If this condition is not controlled, it may put you at risk for serious complications.  Your personal target blood pressure may vary depending on your medical conditions, your age, and other factors. For most people, a normal blood pressure is less than 120/80.  Hypertension is managed by lifestyle changes, medicines, or both. Lifestyle changes include weight loss, eating a healthy, low-sodium diet, exercising more, and limiting alcohol. This information is not intended to replace advice given to you by your health care provider. Make sure you discuss any questions you have with your health care provider. Document Revised: 09/13/2018 Document Reviewed: 04/19/2016 Elsevier Patient Education  2020 [employer].

## 2019-10-31 LAB — BASIC METABOLIC PANEL
BUN/Creatinine Ratio: 16 (ref 9–23)
BUN: 16 mg/dL (ref 6–24)
CO2: 32 mmol/L — ABNORMAL HIGH (ref 20–29)
Calcium: 9.3 mg/dL (ref 8.7–10.2)
Chloride: 94 mmol/L — ABNORMAL LOW (ref 96–106)
Creatinine, Ser: 1.01 mg/dL — ABNORMAL HIGH (ref 0.57–1.00)
GFR calc Af Amer: 73 mL/min/{1.73_m2} (ref >59)
GFR calc non Af Amer: 63 mL/min/{1.73_m2} (ref >59)
Glucose: 83 mg/dL (ref 65–99)
Potassium: 3.3 mmol/L — ABNORMAL LOW (ref 3.5–5.2)
Sodium: 144 mmol/L (ref 134–144)

## 2019-10-31 LAB — CYTOLOGY - PAP
Chlamydia: NEGATIVE
Comment: NEGATIVE
Comment: NEGATIVE
Comment: NEGATIVE
Comment: NORMAL
Diagnosis: NEGATIVE
High risk HPV: POSITIVE — AB
Neisseria Gonorrhea: NEGATIVE
Trichomonas: POSITIVE — AB

## 2019-11-06 ENCOUNTER — Telehealth: Payer: Self-pay | Admitting: Family Medicine

## 2019-11-06 DIAGNOSIS — A599 Trichomoniasis, unspecified: Secondary | ICD-10-CM | POA: Insufficient documentation

## 2019-11-06 DIAGNOSIS — R87618 Other abnormal cytological findings on specimens from cervix uteri: Secondary | ICD-10-CM | POA: Insufficient documentation

## 2019-11-06 DIAGNOSIS — E876 Hypokalemia: Secondary | ICD-10-CM

## 2019-11-06 HISTORY — DX: Other abnormal cytological findings on specimens from cervix uteri: R87.618

## 2019-11-06 HISTORY — DX: Trichomoniasis, unspecified: A59.9

## 2019-11-06 MED ORDER — METRONIDAZOLE 500 MG PO TABS: 500.0000 mg | ORAL_TABLET | Freq: Two times a day (BID) | ORAL | 0 refills | Status: DC

## 2019-11-06 MED ORDER — POTASSIUM CHLORIDE 20 MEQ PO PACK: 40.0000 meq | PACK | Freq: Every day | ORAL | 1 refills | Status: DC

## 2019-11-06 NOTE — Telephone Encounter (Signed)
Attempted to call patient regarding recent lab results, she did not answer and I left a HIPPA compliant VM asking her to call the office. Patient is HR HPV (+), trichomonas (+), and cytology NILM. I also wanted to ask about her blood pressure and if she was having any trouble with her medications. I plan to start her on a potassium supplement and have a future BMP ordered for 1 week.  - Called in metronidazole 500 mg BID x 7 days for trichomonas (do not drink on medication) - Called in potassium chloride 40 mEq daily for low potassium - Recheck BMP in 1 week on 11/14/19 at 9 AM - Repeat pap smear in 1 year  Will attempt to call patient again tomorrow.  Shirlean Mylar, MD [facility], PGY-1

## 2019-11-06 NOTE — Assessment & Plan Note (Signed)
Will recheck A1c at next appt

## 2019-11-06 NOTE — Assessment & Plan Note (Signed)
Trichomonas (+) on pap smear. Will treat with metronidazole 500 mg BID x7d.

## 2019-11-06 NOTE — Progress Notes (Signed)
SUBJECTIVE:   CHIEF COMPLAINT / HPI: HTN f/u, pap smear  HTN: Patient confused about which medications to take, as they were different colors (but same medication, indapamide). Brought both of her prescription bottles, appears she likely was not compliant as one bottle was from October and the other was from December when dose was increased. Patient was asymptomatic, but hypertensive to 170s/100s. Discussed red flag sx, and when to seek tx at ED. Discussed importance of starting amlodipine, second agent, as well as checking BP at home. Patient has swelling and CP assoc with losartan at previous usage several years ago, avoiding ACEI/ARBs for this reason. Patient also has had mildly low K, most likely due to indapamide. Will recheck BMP, consider starting potassium.  Pap smear: patient is intermittently sexually active, has female sexual partners, intermittent condom use. No discharge or other concerns, but would like to have STI testing.  PERTINENT  PMH / PSH: HTN  OBJECTIVE:   BP (!) 172/105   Pulse 91   Temp 98.6 F (37 C) (Oral)   Wt 98 kg   SpO2 96%   BMI 42.18 kg/m   Physical Exam Constitutional:      General: She is not in acute distress.    Appearance: Normal appearance. She is obese. She is not ill-appearing or toxic-appearing.  HENT:     Head: Normocephalic and atraumatic.  Eyes:     Conjunctiva/sclera: Conjunctivae normal.  Cardiovascular:     Rate and Rhythm: Normal rate and regular rhythm.     Pulses: Normal pulses.     Heart sounds: Normal heart sounds. No murmur. No friction rub. No gallop.   Pulmonary:     Effort: Pulmonary effort is normal.     Breath sounds: No wheezing, rhonchi or rales.  Abdominal:     General: Abdomen is flat. Bowel sounds are normal. There is no distension.     Palpations: Abdomen is soft.     Tenderness: There is no abdominal tenderness.  Genitourinary:    Comments: PELVIC:  Normal appearing external female genitalia, normal vaginal  epithelium, no abnormal discharge. Normal appearing cervix.   Musculoskeletal:     Right lower leg: No edema.     Left lower leg: No edema.  Skin:    General: Skin is warm and dry.     Findings: No lesion or rash.  Neurological:     General: No focal deficit present.     Mental Status: She is alert and oriented to person, place, and time. Mental status is at baseline.  Psychiatric:        Mood and Affect: Mood normal.        Behavior: Behavior normal.    ASSESSMENT/PLAN:  Janice [name] is a 54 yo woman presenting with uncontrolled HTN and for routine pap smear.  HYPERTENSION, BENIGN SYSTEMIC BP elevated 170s/100s today. Pt taking indapamide 2.5mg  and 1.25 mg intermittently. Suspect nonadherence as patient has pill bottles from October and December. Will repeat BMP today. - Plan to start potassium chloride daily with BMP recheck in 1 week. - Continue Indapamide 5 mg - Start amlodipine 5 mg - Return in 2 weeks for BP check.  Prediabetes Will recheck A1c at next appt  Pap smear abnormality of cervix/human papillomavirus (HPV) positive Pap smear with Co-testing performed. HR HPV (+). According to [facility], will repeat pap smear with co-testing in 1 year.  Trichomonas infection Trichomonas (+) on pap smear. Will treat with metronidazole 500 mg BID x7d.  Gladys Damme, MD [place]

## 2019-11-06 NOTE — Assessment & Plan Note (Signed)
Pap smear with Co-testing performed. HR HPV (+). According to [facility], will repeat pap smear with co-testing in 1 year.

## 2019-11-06 NOTE — Assessment & Plan Note (Addendum)
BP elevated 170s/100s today. Pt taking indapamide 2.5mg  and 1.25 mg intermittently. Suspect nonadherence as patient has pill bottles from October and December. Will repeat BMP today. - Plan to start potassium chloride daily with BMP recheck in 1 week. - Continue Indapamide 5 mg - Start amlodipine 5 mg - Return in 2 weeks for BP check.

## 2019-11-07 NOTE — Telephone Encounter (Signed)
Patient calls nurse line returning PCP phone call. Patient informed all test results. Patient advised on HR HPV and the need to have repeat pap smear in one year. Patient advised on positive trich and discussed at length treatment and medication to pharmacy and the need for partner to get teatment. Patient advised on potassium and need to take daily, medication to pharmacy. Patient advised on BMP and need for repeat on 6/11. Patient appreciative of information. All questions answered.

## 2019-11-11 ENCOUNTER — Other Ambulatory Visit: Payer: Self-pay | Admitting: Family Medicine

## 2019-11-11 DIAGNOSIS — E876 Hypokalemia: Secondary | ICD-10-CM

## 2019-11-14 ENCOUNTER — Other Ambulatory Visit: Payer: BC Managed Care – PPO

## 2021-01-25 ENCOUNTER — Other Ambulatory Visit: Payer: Self-pay

## 2021-01-25 ENCOUNTER — Ambulatory Visit (HOSPITAL_COMMUNITY)
Admission: EM | Admit: 2021-01-25 | Discharge: 2021-01-25 | Disposition: A | Discharge status: Home / Self Care | Payer: Self-pay | Attending: Family Medicine | Admitting: Family Medicine

## 2021-01-25 ENCOUNTER — Encounter (HOSPITAL_COMMUNITY): Payer: Self-pay

## 2021-01-25 DIAGNOSIS — M25561 Pain in right knee: Secondary | ICD-10-CM

## 2021-01-25 MED ORDER — NAPROXEN 500 MG PO TABS
500.0000 mg | ORAL_TABLET | Freq: Two times a day (BID) | ORAL | 1 refills | Status: DC
Start: 2021-01-25 — End: 2021-05-31

## 2021-01-25 NOTE — ED Provider Notes (Signed)
[facility]    CSN: [id number] Arrival date & time: 01/25/21  1533      History   Chief Complaint Chief Complaint  Patient presents with   Leg Pain    HPI Janice [name] is a 55 y.o. female.   Right Knee Pain 5 days of worsening pain Reports that is radiating from her knee up into her thigh and into her lower leg She denies any swelling She denies any calf pain specifically Denies any hip pain Denies any injury States that she works and is standing on her feet quite a lot She has worse pain going up stairs She had difficulty sleeping last night because she could not get in a comfortable position because the pain She has not noticed any swelling She does feel some small popping occurring in her knee when she moves it She has pain with extension and flexion of her knee Denies any prior surgeries She has not taken anything for the pain Denies numbness and tingling  In regards to her high blood pressure, she denies chest pain, difficulty breathing, changes in vision, focal weakness, changes in urination   Past Medical History:  Diagnosis Date   Depression    history of   GERD (gastroesophageal reflux disease)    acid reflux at night and with spicy foods   Hypertension    Morbid obesity with BMI of 40.0-44.9, adult [facility])     Patient Active Problem List   Diagnosis Date Noted   Pap smear abnormality of cervix/human papillomavirus (HPV) positive 11/06/2019   Trichomonas infection 11/06/2019   Prediabetes 04/04/2019   Perimenopausal 03/06/2019   Psoriasis 03/06/2019   Vitamin D deficiency 09/29/2015   CHEST PAIN 10/04/2007   OBESITY, NOS 08/02/2006   TOBACCO DEPENDENCE 08/02/2006   Depressive disorder, not elsewhere classified 08/02/2006   HYPERTENSION, BENIGN SYSTEMIC 08/02/2006   Esophageal reflux 08/02/2006    Past Surgical History:  Procedure Laterality Date   CESAREAN SECTION     x2    OB History   No obstetric history on file.       Home Medications    Prior to Admission medications   Medication Sig Start Date End Date Taking? Authorizing Provider  naproxen (NAPROSYN) 500 MG tablet Take 1 tablet (500 mg total) by mouth 2 (two) times daily with a meal. 01/25/21  Yes Taym Twist, Solmon Ice, DO  adapalene (DIFFERIN) 0.1 % gel Apply topically at bedtime. 10/15/19   Shirley, [place], DO  amLODipine (NORVASC) 5 MG tablet Take 1 tablet (5 mg total) by mouth at bedtime. 10/15/19   Shirley, [place], DO  indapamide (LOZOL) 2.5 MG tablet Take 2 tablets (5 mg total) by mouth daily. 10/30/19   Shirlean Mylar, MD  metroNIDAZOLE (FLAGYL) 500 MG tablet Take 1 tablet (500 mg total) by mouth 2 (two) times daily. 11/06/19   Shirlean Mylar, MD  potassium chloride (KLOR-CON) 20 MEQ packet Take 40 mEq by mouth daily. 11/06/19   Shirlean Mylar, MD  tazarotene (TAZORAC) 0.05 % gel Apply topically at bedtime. 10/17/19   Shirley, [place], DO    Family History Family History  Problem Relation Age of Onset   Hypertension Maternal Grandmother    Stroke Maternal Grandmother    Hypertension Mother    Kidney disease Mother    Hypertension Daughter    Alcohol abuse Father    Depression Father    Drug abuse Father    Hypertension Father    Alzheimer's disease Maternal Grandfather  Social History Social History   Tobacco Use   Smoking status: Every Day    Packs/day: 0.25    Years: 40.00    Pack years: 10.00    Types: Cigarettes   Smokeless tobacco: Never   Tobacco comments:    Wants to quit in 2021  Vaping Use   Vaping Use: Never used  Substance Use Topics   Alcohol use: Yes    Comment: occ, 2 drinks   Drug use: No     Allergies   Losartan and Neomycin sulfate   Review of Systems Review of Systems  Constitutional:  Negative for activity change and fever.  Respiratory:  Negative for shortness of breath.   Cardiovascular:  Negative for chest pain.  Genitourinary:  Negative for difficulty urinating.  Musculoskeletal:   Negative for back pain and joint swelling.       Right knee pain   Skin:  Positive for rash (Psoriasis).  Neurological:  Negative for dizziness, weakness and numbness.    Physical Exam Triage Vital Signs ED Triage Vitals  Enc Vitals Group     BP 01/25/21 1600 (!) 160/106     Pulse Rate 01/25/21 1559 85     Resp 01/25/21 1559 19     Temp 01/25/21 1559 97.9 F (36.6 C)     Temp Source 01/25/21 1559 Oral     SpO2 01/25/21 1559 97 %     Weight --      Height --      Head Circumference --      Peak Flow --      Pain Score 01/25/21 1557 9     Pain Loc --      Pain Edu? --      Excl. in GC? --    No data found.  Updated Vital Signs BP (!) 160/106 (BP Location: Right Arm) Comment: Pt states she did not take BP medication today  Pulse 85   Temp 97.9 F (36.6 C) (Oral)   Resp 19   SpO2 97%   Visual Acuity Right Eye Distance:   Left Eye Distance:   Bilateral Distance:    Right Eye Near:   Left Eye Near:    Bilateral Near:     Physical Exam Constitutional:      Appearance: Normal appearance.  HENT:     Head: Normocephalic and atraumatic.  Cardiovascular:     Rate and Rhythm: Normal rate.  Pulmonary:     Effort: Pulmonary effort is normal.     Breath sounds: Normal breath sounds.  Musculoskeletal:     Comments: Right Knee: - Inspection: no gross deformity b/l. No swelling/effusion, erythema or bruising b/l. Skin intact - Palpation: TTP medial joint line - ROM: full active ROM with flexion and extension in knee and hip b/l, 1+ crepitus - Strength: 5/5 strength b/l - Neuro/vasc: NV intact distally b/l - Special Tests: - LIGAMENTS: negative anterior and posterior drawer, negative Lachman's, no MCL or LCL laxity  -- MENISCUS: equivocal McMurray's -- PF JOINT: nml patellar mobility bilaterally.  negative patellar apprehension  Hips: normal ROM   Skin:    General: Skin is warm and dry.  Neurological:     General: No focal deficit present.     Mental Status: She  is alert and oriented to person, place, and time.     Sensory: No sensory deficit.  Psychiatric:        Mood and Affect: Mood normal.        Behavior:  Behavior normal.        Thought Content: Thought content normal.        Judgment: Judgment normal.     UC Treatments / Results  Labs (all labs ordered are listed, but only abnormal results are displayed) Labs Reviewed - No data to display  EKG   Radiology No results found.  Procedures Procedures (including critical care time)  Medications Ordered in UC Medications - No data to display  Initial Impression / Assessment and Plan / UC Course  I have reviewed the triage vital signs and the nursing notes.  Pertinent labs & imaging results that were available during my care of the patient were reviewed by me and considered in my medical decision making (see chart for details).     Patient is a 55 year old female with past medical history significant for hypertension and psoriasis who presents with 5 days of worsening right knee pain that radiates to her right thigh and anterior portion of her lower leg.  She does not have any signs or symptoms to suggest a DVT.  Her symptoms are most likely consistent with arthritis.  The patient does not have insurance and would opt to not perform any imaging today.  Did discuss treatment options with her including home exercise program, oral anti-inflammatory, or corticosteroid injection.  She opts to proceed with oral anti-inflammatory and home exercise program.  She was given quadrant thinning exercises, hip abductor strengthening exercises.  Prescription given for naproxen to use twice daily as needed with food.  Advised her to take it scheduled for the next few days.  She can also ice her knee.  We will write her out of work for 1 day.  She will follow-up with her primary care provider as needed, and a referral was made for this.  In regards to her hypertension on exam, she is asymptomatic.  Given  return precautions.  Follow-up with PCP.  She was discharged home in stable condition. Final Clinical Impressions(s) / UC Diagnoses   Final diagnoses:  Acute pain of right knee     Discharge Instructions      Your pain is likely caused by arthritis in your knee.  We will give you an anti-inflammatory medicine to help with this.  You can take it twice daily as needed for your pain.  Take this with food.  Do not take any other anti-inflammatories with this including over-the-counter ibuprofen, Advil, Aleve.  Ice your knee.  Rest it tomorrow.  Do the exercises that I showed you for 10 repetitions at least 3 times a day.  If you do not have improvement in your symptoms, you should follow-up with your primary care provider.  I have also placed a referral for you for this.  If you have significant worsening pain, especially if you have swelling in your calf that is new, you should be seen in the emergency room right away.     ED Prescriptions     Medication Sig Dispense Auth. Provider   naproxen (NAPROSYN) 500 MG tablet Take 1 tablet (500 mg total) by mouth 2 (two) times daily with a meal. 60 tablet Anatalia Kronk, Solmon Ice, DO      PDMP not reviewed this encounter.   Unknown Jim, DO 01/25/21 1653

## 2021-01-25 NOTE — Discharge Instructions (Addendum)
Your pain is likely caused by arthritis in your knee.  We will give you an anti-inflammatory medicine to help with this.  You can take it twice daily as needed for your pain.  Take this with food.  Do not take any other anti-inflammatories with this including over-the-counter ibuprofen, Advil, Aleve.  Ice your knee.  Rest it tomorrow.  Do the exercises that I showed you for 10 repetitions at least 3 times a day.  If you do not have improvement in your symptoms, you should follow-up with your primary care provider.  I have also placed a referral for you for this.  If you have significant worsening pain, especially if you have swelling in your calf that is new, you should be seen in the emergency room right away.

## 2021-01-25 NOTE — ED Triage Notes (Signed)
Pt presents with right leg pain X 5 days.   States she has been feeling pain in her thigh. States her work environment is always cold. Pt c/o having a hard time driving and sleeping because of the leg pain.

## 2021-03-07 ENCOUNTER — Other Ambulatory Visit: Payer: Self-pay

## 2021-03-07 ENCOUNTER — Encounter (HOSPITAL_COMMUNITY): Payer: Self-pay

## 2021-03-07 ENCOUNTER — Ambulatory Visit (HOSPITAL_COMMUNITY)
Admission: EM | Admit: 2021-03-07 | Discharge: 2021-03-07 | Disposition: A | Discharge status: Home / Self Care | Payer: Self-pay | Attending: Student | Admitting: Student

## 2021-03-07 DIAGNOSIS — I10 Essential (primary) hypertension: Secondary | ICD-10-CM

## 2021-03-07 DIAGNOSIS — S39012A Strain of muscle, fascia and tendon of lower back, initial encounter: Secondary | ICD-10-CM

## 2021-03-07 MED ORDER — TIZANIDINE HCL 2 MG PO TABS
2.0000 mg | ORAL_TABLET | Freq: Three times a day (TID) | ORAL | 0 refills | Status: DC | PRN
Start: 2021-03-07 — End: 2021-11-14

## 2021-03-07 NOTE — Discharge Instructions (Addendum)
-  Start the muscle relaxer-Zanaflex (tizanidine), up to 3 times daily for muscle spasms and pain.  This can make you drowsy, so take at bedtime or when you do not need to drive or operate machinery. -Rest, ice/heat -Follow-up if symptoms getting worse instead of better  -Please check your blood pressure at home or at the pharmacy. If this continues to be >140/90, follow-up with your primary care provider for further blood pressure management/ medication titration. If you develop chest pain, shortness of breath, vision changes, the worst headache of your life- head straight to the ED or call 911.

## 2021-03-07 NOTE — ED Triage Notes (Signed)
Pt reports left sided back pain and right elbow pain x 4 days. Pt think she pulled a muscle at work.

## 2021-03-07 NOTE — ED Provider Notes (Signed)
[facility]    CSN: [id number] Arrival date & time: 03/07/21  1627      History   Chief Complaint Chief Complaint  Patient presents with   Back Pain   Elbow Pain    HPI Rosaleen Mazer Pryer is a 55 y.o. female presenting with back and elbow pain x4 days. Medical history prediabetes.  States she does a lot of heavy lifting at work, following this developed some left-sided lower back pain.  Also with right elbow pain after hitting it on something, slowly improving.  Denies sensation changes. Denies pain shooting down legs, denies numbness in arms/legs, denies weakness in arms/legs, denies saddle anesthesia, denies bowel/bladder incontinence, denies urinary retention, denies constipation.   HPI  Past Medical History:  Diagnosis Date   Depression    history of   GERD (gastroesophageal reflux disease)    acid reflux at night and with spicy foods   Hypertension    Morbid obesity with BMI of 40.0-44.9, adult [facility])     Patient Active Problem List   Diagnosis Date Noted   Pap smear abnormality of cervix/human papillomavirus (HPV) positive 11/06/2019   Trichomonas infection 11/06/2019   Prediabetes 04/04/2019   Perimenopausal 03/06/2019   Psoriasis 03/06/2019   Vitamin D deficiency 09/29/2015   CHEST PAIN 10/04/2007   OBESITY, NOS 08/02/2006   TOBACCO DEPENDENCE 08/02/2006   Depressive disorder, not elsewhere classified 08/02/2006   HYPERTENSION, BENIGN SYSTEMIC 08/02/2006   Esophageal reflux 08/02/2006    Past Surgical History:  Procedure Laterality Date   CESAREAN SECTION     x2    OB History   No obstetric history on file.      Home Medications    Prior to Admission medications   Medication Sig Start Date End Date Taking? Authorizing Provider  tiZANidine (ZANAFLEX) 2 MG tablet Take 1 tablet (2 mg total) by mouth every 8 (eight) hours as needed for muscle spasms. 03/07/21  Yes Rhys Martini, PA-C  adapalene (DIFFERIN) 0.1 % gel Apply topically at  bedtime. 10/15/19   Shirley, [place], DO  amLODipine (NORVASC) 5 MG tablet Take 1 tablet (5 mg total) by mouth at bedtime. 10/15/19   Shirley, [place], DO  indapamide (LOZOL) 2.5 MG tablet Take 2 tablets (5 mg total) by mouth daily. 10/30/19   Shirlean Mylar, MD  metroNIDAZOLE (FLAGYL) 500 MG tablet Take 1 tablet (500 mg total) by mouth 2 (two) times daily. 11/06/19   Shirlean Mylar, MD  naproxen (NAPROSYN) 500 MG tablet Take 1 tablet (500 mg total) by mouth 2 (two) times daily with a meal. 01/25/21   Meccariello, Solmon Ice, DO  potassium chloride (KLOR-CON) 20 MEQ packet Take 40 mEq by mouth daily. 11/06/19   Shirlean Mylar, MD  tazarotene (TAZORAC) 0.05 % gel Apply topically at bedtime. 10/17/19   Shirley, [place], DO    Family History Family History  Problem Relation Age of Onset   Hypertension Maternal Grandmother    Stroke Maternal Grandmother    Hypertension Mother    Kidney disease Mother    Hypertension Daughter    Alcohol abuse Father    Depression Father    Drug abuse Father    Hypertension Father    Alzheimer's disease Maternal Grandfather     Social History Social History   Tobacco Use   Smoking status: Every Day    Packs/day: 0.25    Years: 40.00    Pack years: 10.00    Types: Cigarettes   Smokeless tobacco: Never  Tobacco comments:    Wants to quit in 2021  Vaping Use   Vaping Use: Never used  Substance Use Topics   Alcohol use: Yes    Comment: occ, 2 drinks   Drug use: No     Allergies   Losartan and Neomycin sulfate   Review of Systems Review of Systems  Constitutional:  Negative for chills, fever and unexpected weight change.  Respiratory:  Negative for chest tightness and shortness of breath.   Cardiovascular:  Negative for chest pain and palpitations.  Gastrointestinal:  Negative for abdominal pain, diarrhea, nausea and vomiting.  Genitourinary:  Negative for decreased urine volume, difficulty urinating and frequency.  Musculoskeletal:  Positive  for back pain. Negative for arthralgias, gait problem, joint swelling, myalgias, neck pain and neck stiffness.  Skin:  Negative for wound.  Neurological:  Negative for dizziness, tremors, seizures, syncope, facial asymmetry, speech difficulty, weakness, light-headedness, numbness and headaches.  All other systems reviewed and are negative.   Physical Exam Triage Vital Signs ED Triage Vitals  Enc Vitals Group     BP 03/07/21 1904 (!) 174/132     Pulse Rate 03/07/21 1904 79     Resp 03/07/21 1904 20     Temp 03/07/21 1904 98.8 F (37.1 C)     Temp Source 03/07/21 1904 Oral     SpO2 03/07/21 1904 97 %     Weight --      Height --      Head Circumference --      Peak Flow --      Pain Score 03/07/21 1902 9     Pain Loc --      Pain Edu? --      Excl. in GC? --    No data found.  Updated Vital Signs BP (!) 174/132 (BP Location: Left Arm)   Pulse 79   Temp 98.8 F (37.1 C) (Oral)   Resp 20   SpO2 97%   Visual Acuity Right Eye Distance:   Left Eye Distance:   Bilateral Distance:    Right Eye Near:   Left Eye Near:    Bilateral Near:     Physical Exam Vitals reviewed.  Constitutional:      General: She is not in acute distress.    Appearance: Normal appearance. She is not ill-appearing.  HENT:     Head: Normocephalic and atraumatic.  Cardiovascular:     Rate and Rhythm: Normal rate and regular rhythm.     Heart sounds: Normal heart sounds.  Pulmonary:     Effort: Pulmonary effort is normal.     Breath sounds: Normal breath sounds and air entry.  Abdominal:     Tenderness: There is no abdominal tenderness. There is no right CVA tenderness, left CVA tenderness, guarding or rebound.  Musculoskeletal:     Cervical back: Normal range of motion. No swelling, deformity, signs of trauma, rigidity, spasms, tenderness, bony tenderness or crepitus. No pain with movement.     Thoracic back: No swelling, deformity, signs of trauma, spasms, tenderness or bony tenderness.  Normal range of motion. No scoliosis.     Lumbar back: No swelling, deformity, signs of trauma, spasms, tenderness or bony tenderness. Normal range of motion. Negative right straight leg raise test and negative left straight leg raise test. No scoliosis.     Comments: Left-sided lumbar paraspinous muscle tenderness to palpation, pain elicited with flexion and extension lumbar spine.  No cervical or thoracic paraspinous muscle tenderness.  Strength and  sensation grossly intact upper and lower extremities, gait intact.  Negative straight leg raise bilaterally.  No saddle anesthesia. No midline spinous tenderness, deformity, stepoff.  Right elbow is minimally tender over medial epicondyle, without skin changes or effusion.  Range of motion flexion and extension intact and with minimal pain.  Grip strength 5/5 bilaterally, no snuffbox tenderness.  Cap refill less than 2 seconds, radial pulse 2+.  Absolutely no other injury, deformity, tenderness, ecchymosis, abrasion.  Neurological:     General: No focal deficit present.     Mental Status: She is alert.     Cranial Nerves: No cranial nerve deficit.  Psychiatric:        Mood and Affect: Mood normal.        Behavior: Behavior normal.        Thought Content: Thought content normal.        Judgment: Judgment normal.     UC Treatments / Results  Labs (all labs ordered are listed, but only abnormal results are displayed) Labs Reviewed - No data to display  EKG   Radiology No results found.  Procedures Procedures (including critical care time)  Medications Ordered in UC Medications - No data to display  Initial Impression / Assessment and Plan / UC Course  I have reviewed the triage vital signs and the nursing notes.  Pertinent labs & imaging results that were available during my care of the patient were reviewed by me and considered in my medical decision making (see chart for details).     This patient is a very pleasant 55 y.o.  year old female presenting with lumbar strain and hypertension. No red flag symptoms.   For lumbar strain, Zanaflex as below.  Also recommended heating pad, gentle range of motion exercises.  Blood pressure is significantly elevated today, patient states she has not taken her evening dose of the amlodipine yet but she did take the morning dose.  Denies headaches, dizziness, vision changes, weakness, chest pain, shortness of breath.  Continue current regimen, monitor at home.  Red flag symptoms and ED return precautions discussed. Patient verbalizes understanding and agreement.    Final Clinical Impressions(s) / UC Diagnoses   Final diagnoses:  Strain of lumbar region, initial encounter  Essential hypertension     Discharge Instructions      -Start the muscle relaxer-Zanaflex (tizanidine), up to 3 times daily for muscle spasms and pain.  This can make you drowsy, so take at bedtime or when you do not need to drive or operate machinery. -Rest, ice/heat -Follow-up if symptoms getting worse instead of better  -Please check your blood pressure at home or at the pharmacy. If this continues to be >140/90, follow-up with your primary care provider for further blood pressure management/ medication titration. If you develop chest pain, shortness of breath, vision changes, the worst headache of your life- head straight to the ED or call 911.      ED Prescriptions     Medication Sig Dispense Auth. Provider   tiZANidine (ZANAFLEX) 2 MG tablet Take 1 tablet (2 mg total) by mouth every 8 (eight) hours as needed for muscle spasms. 21 tablet Rhys Martini, PA-C      PDMP not reviewed this encounter.   Rhys Martini, PA-C 03/07/21 1936

## 2021-03-14 ENCOUNTER — Ambulatory Visit (HOSPITAL_COMMUNITY)
Admission: EM | Admit: 2021-03-14 | Discharge: 2021-03-14 | Disposition: A | Discharge status: Home / Self Care | Payer: Self-pay | Attending: Internal Medicine | Admitting: Internal Medicine

## 2021-03-14 ENCOUNTER — Other Ambulatory Visit: Payer: Self-pay

## 2021-03-14 ENCOUNTER — Encounter (HOSPITAL_COMMUNITY): Payer: Self-pay | Admitting: *Deleted

## 2021-03-14 DIAGNOSIS — M545 Low back pain, unspecified: Secondary | ICD-10-CM

## 2021-03-14 MED ORDER — KETOROLAC TROMETHAMINE 30 MG/ML IJ SOLN
INTRAMUSCULAR | Status: AC
  Filled 2021-03-14: qty 1

## 2021-03-14 MED ORDER — IBUPROFEN 600 MG PO TABS
600.0000 mg | ORAL_TABLET | Freq: Four times a day (QID) | ORAL | 0 refills | Status: DC | PRN
Start: 2021-03-14 — End: 2021-12-26

## 2021-03-14 MED ORDER — KETOROLAC TROMETHAMINE 30 MG/ML IJ SOLN
30.0000 mg | Freq: Once | INTRAMUSCULAR | Status: AC
  Administered 2021-03-14: 30 mg via INTRAMUSCULAR

## 2021-03-14 NOTE — ED Triage Notes (Signed)
Pt reports back pain is not better after last visit.

## 2021-03-14 NOTE — Discharge Instructions (Addendum)
Please take medications as prescribed Gentle range of motion exercises Heating pad use as we discussed Return to urgent care if symptoms worsen.

## 2021-03-15 NOTE — ED Provider Notes (Signed)
[facility]    CSN: [id number] Arrival date & time: 03/14/21  1604      History   Chief Complaint Chief Complaint  Patient presents with   Back Pain    HPI Janice [name] is a 55 y.o. female comes to the urgent care on the second occasion for left-sided lower back pain.  Patient was seen in the urgent care about a week ago for low back pain which started after she did some heavy lifting at work.  Patient was prescribed muscle relaxants and was advised to take NSAIDs for pain.  Patient has been using that with no significant improvement in symptoms.  No radiation of pain into the legs.  No shortness of breath, cough or sputum production.  Patient is able to ambulate.  She has been using heating pad for several hours at a time.  She continues to have pain at this time.  HPI  Past Medical History:  Diagnosis Date   Depression    history of   GERD (gastroesophageal reflux disease)    acid reflux at night and with spicy foods   Hypertension    Morbid obesity with BMI of 40.0-44.9, adult [facility])     Patient Active Problem List   Diagnosis Date Noted   Pap smear abnormality of cervix/human papillomavirus (HPV) positive 11/06/2019   Trichomonas infection 11/06/2019   Prediabetes 04/04/2019   Perimenopausal 03/06/2019   Psoriasis 03/06/2019   Vitamin D deficiency 09/29/2015   CHEST PAIN 10/04/2007   OBESITY, NOS 08/02/2006   TOBACCO DEPENDENCE 08/02/2006   Depressive disorder, not elsewhere classified 08/02/2006   HYPERTENSION, BENIGN SYSTEMIC 08/02/2006   Esophageal reflux 08/02/2006    Past Surgical History:  Procedure Laterality Date   CESAREAN SECTION     x2    OB History   No obstetric history on file.      Home Medications    Prior to Admission medications   Medication Sig Start Date End Date Taking? Authorizing Provider  ibuprofen (ADVIL) 600 MG tablet Take 1 tablet (600 mg total) by mouth every 6 (six) hours as needed. 03/14/21  Yes Sharon Rubis,  Britta Mccreedy, MD  adapalene (DIFFERIN) 0.1 % gel Apply topically at bedtime. 10/15/19   Shirley, [place], DO  amLODipine (NORVASC) 5 MG tablet Take 1 tablet (5 mg total) by mouth at bedtime. 10/15/19   Shirley, [place], DO  indapamide (LOZOL) 2.5 MG tablet Take 2 tablets (5 mg total) by mouth daily. 10/30/19   Shirlean Mylar, MD  metroNIDAZOLE (FLAGYL) 500 MG tablet Take 1 tablet (500 mg total) by mouth 2 (two) times daily. 11/06/19   Shirlean Mylar, MD  naproxen (NAPROSYN) 500 MG tablet Take 1 tablet (500 mg total) by mouth 2 (two) times daily with a meal. 01/25/21   Meccariello, Solmon Ice, DO  potassium chloride (KLOR-CON) 20 MEQ packet Take 40 mEq by mouth daily. 11/06/19   Shirlean Mylar, MD  tazarotene (TAZORAC) 0.05 % gel Apply topically at bedtime. 10/17/19   Shirley, [place], DO  tiZANidine (ZANAFLEX) 2 MG tablet Take 1 tablet (2 mg total) by mouth every 8 (eight) hours as needed for muscle spasms. 03/07/21   Rhys Martini, PA-C    Family History Family History  Problem Relation Age of Onset   Hypertension Mother    Kidney disease Mother    Alcohol abuse Father    Depression Father    Drug abuse Father    Hypertension Father    Hypertension Maternal Grandmother  Stroke Maternal Grandmother    Alzheimer's disease Maternal Grandfather    Hypertension Daughter     Social History Social History   Tobacco Use   Smoking status: Every Day    Packs/day: 0.25    Years: 40.00    Pack years: 10.00    Types: Cigarettes   Smokeless tobacco: Never   Tobacco comments:    Wants to quit in 2021  Vaping Use   Vaping Use: Never used  Substance Use Topics   Alcohol use: Yes    Comment: occ, 2 drinks   Drug use: No     Allergies   Losartan and Neomycin sulfate   Review of Systems Review of Systems  Respiratory: Negative.    Cardiovascular: Negative.   Musculoskeletal:  Positive for back pain. Negative for joint swelling, myalgias, neck pain and neck stiffness.  Skin: Negative.    Neurological: Negative.     Physical Exam Triage Vital Signs ED Triage Vitals  Enc Vitals Group     BP 03/14/21 1805 (!) 211/118     Pulse Rate 03/14/21 1805 73     Resp 03/14/21 1805 20     Temp 03/14/21 1805 98.2 F (36.8 C)     Temp src --      SpO2 03/14/21 1805 94 %     Weight --      Height --      Head Circumference --      Peak Flow --      Pain Score 03/14/21 1802 8     Pain Loc --      Pain Edu? --      Excl. in GC? --    No data found.  Updated Vital Signs BP (!) 211/118   Pulse 73   Temp 98.2 F (36.8 C)   Resp 20   SpO2 94%   Visual Acuity Right Eye Distance:   Left Eye Distance:   Bilateral Distance:    Right Eye Near:   Left Eye Near:    Bilateral Near:     Physical Exam Vitals and nursing note reviewed.  Constitutional:      General: She is not in acute distress.    Appearance: She is not ill-appearing.  Cardiovascular:     Rate and Rhythm: Normal rate and regular rhythm.  Pulmonary:     Effort: Pulmonary effort is normal.     Breath sounds: Normal breath sounds.  Musculoskeletal:        General: Tenderness present. No deformity. Normal range of motion.  Skin:    General: Skin is warm.     Findings: No bruising.  Neurological:     Mental Status: She is alert.     UC Treatments / Results  Labs (all labs ordered are listed, but only abnormal results are displayed) Labs Reviewed - No data to display  EKG   Radiology No results found.  Procedures Procedures (including critical care time)  Medications Ordered in UC Medications  ketorolac (TORADOL) 30 MG/ML injection 30 mg (30 mg Intramuscular Given 03/14/21 1857)    Initial Impression / Assessment and Plan / UC Course  I have reviewed the triage vital signs and the nursing notes.  Pertinent labs & imaging results that were available during my care of the patient were reviewed by me and considered in my medical decision making (see chart for details).     1.  Acute  left-sided low back pain without sciatica: Toradol 30 mg IM x1 dose  Gentle range of motion exercises Continue muscle relaxant use at bedtime Ibuprofen 600 mg every 6 hours as needed for pain Heating pad use only 20 minutes on-20 minutes off cycle Return to urgent care if symptoms worsen. Final Clinical Impressions(s) / UC Diagnoses   Final diagnoses:  Acute left-sided low back pain without sciatica     Discharge Instructions      Please take medications as prescribed Gentle range of motion exercises Heating pad use as we discussed Return to urgent care if symptoms worsen.   ED Prescriptions     Medication Sig Dispense Auth. Provider   ibuprofen (ADVIL) 600 MG tablet Take 1 tablet (600 mg total) by mouth every 6 (six) hours as needed. 30 tablet Alonte Wulff, Britta Mccreedy, MD      PDMP not reviewed this encounter.   Merrilee Jansky, MD 03/15/21 1007

## 2021-03-20 ENCOUNTER — Ambulatory Visit (HOSPITAL_COMMUNITY)
Admission: EM | Admit: 2021-03-20 | Discharge: 2021-03-20 | Discharge status: Against Medical Advice | Payer: Medicaid Other | Attending: Emergency Medicine | Admitting: Emergency Medicine

## 2021-03-20 ENCOUNTER — Encounter (HOSPITAL_COMMUNITY): Payer: Self-pay | Admitting: *Deleted

## 2021-03-20 ENCOUNTER — Other Ambulatory Visit: Payer: Self-pay

## 2021-03-20 DIAGNOSIS — M79642 Pain in left hand: Secondary | ICD-10-CM

## 2021-03-20 DIAGNOSIS — M79641 Pain in right hand: Secondary | ICD-10-CM

## 2021-03-20 DIAGNOSIS — M545 Low back pain, unspecified: Secondary | ICD-10-CM

## 2021-03-20 MED ORDER — METHYLPREDNISOLONE SODIUM SUCC 125 MG IJ SOLR
INTRAMUSCULAR | Status: AC
  Filled 2021-03-20: qty 2

## 2021-03-20 MED ORDER — METHYLPREDNISOLONE SODIUM SUCC 125 MG IJ SOLR: 60.0000 mg | Freq: Once | INTRAMUSCULAR | Status: DC

## 2021-03-20 NOTE — ED Triage Notes (Addendum)
Pt reports both hands and lower back started to hurt on Thursday after leaving work. Pt wants a work note for light duty.

## 2021-03-20 NOTE — Discharge Instructions (Addendum)
Continue to take ibuprofen as prescribed and directed Solu-Medrol IM was given in office Work note was given Follow-up and establish care with PCP Return or go to ED if you develop any new or worsening of your symptoms

## 2021-03-20 NOTE — ED Provider Notes (Addendum)
[facility]   [id number] 03/20/21 Arrival Time: 1522  Chief Complaint  Patient presents with   Hand Pain    Lt/RT   Back Pain     SUBJECTIVE: History from: patient.  Janice [name] is a 55 y.o. female who presents to the urgent care with a complaint of lower back and hand pain that has been getting worse since Thursday.  Developed the symptom after work.  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  She describes the pain as constant and achy.  She has tried OTC medications without relief.  Her symptoms are made worse with ROM.  She denies similar symptoms in the past.  She denies chills, fever, nausea, vomiting and diarrhea.  ROS: As per HPI.  All other pertinent ROS negative.      Past Medical History:  Diagnosis Date   Depression    history of   GERD (gastroesophageal reflux disease)    acid reflux at night and with spicy foods   Hypertension    Morbid obesity with BMI of 40.0-44.9, adult (HCC)    Past Surgical History:  Procedure Laterality Date   CESAREAN SECTION     x2   Allergies  Allergen Reactions   Losartan     Chest pain   Neomycin Sulfate Swelling, Rash and Other (See Comments)    Pain in ear, swelling and redness   No current facility-administered medications on file prior to encounter.   Current Outpatient Medications on File Prior to Encounter  Medication Sig Dispense Refill   adapalene (DIFFERIN) 0.1 % gel Apply topically at bedtime. 45 g 0   amLODipine (NORVASC) 5 MG tablet Take 1 tablet (5 mg total) by mouth at bedtime. 90 tablet 3   ibuprofen (ADVIL) 600 MG tablet Take 1 tablet (600 mg total) by mouth every 6 (six) hours as needed. 30 tablet 0   indapamide (LOZOL) 2.5 MG tablet Take 2 tablets (5 mg total) by mouth daily. 180 tablet 0   metroNIDAZOLE (FLAGYL) 500 MG tablet Take 1 tablet (500 mg total) by mouth 2 (two) times daily. 14 tablet 0   naproxen (NAPROSYN) 500 MG tablet Take 1 tablet (500 mg total) by  mouth 2 (two) times daily with a meal. 60 tablet 1   potassium chloride (KLOR-CON) 20 MEQ packet Take 40 mEq by mouth daily. 30 each 1   tazarotene (TAZORAC) 0.05 % gel Apply topically at bedtime. 30 g 0   tiZANidine (ZANAFLEX) 2 MG tablet Take 1 tablet (2 mg total) by mouth every 8 (eight) hours as needed for muscle spasms. 21 tablet 0   Social History   Socioeconomic History   Marital status: Single    Spouse name: Not on file   Number of children: 3   Years of education: Not on file   Highest education level: Not on file  Occupational History   Not on file  Tobacco Use   Smoking status: Every Day    Packs/day: 0.25    Years: 40.00    Pack years: 10.00    Types: Cigarettes   Smokeless tobacco: Never   Tobacco comments:    Wants to quit in 2021  Vaping Use   Vaping Use: Never used  Substance and Sexual Activity   Alcohol use: Yes    Comment: occ, 2 drinks   Drug use: No   Sexual activity: Yes    Birth control/protection: Condom  Other Topics Concern   Not on file  Social History Narrative   Not on file   Social Determinants of Health   Financial Resource Strain: Not on file  Food Insecurity: Not on file  Transportation Needs: Not on file  Physical Activity: Not on file  Stress: Not on file  Social Connections: Not on file  Intimate Partner Violence: Not on file   Family History  Problem Relation Age of Onset   Hypertension Mother    Kidney disease Mother    Alcohol abuse Father    Depression Father    Drug abuse Father    Hypertension Father    Hypertension Maternal Grandmother    Stroke Maternal Grandmother    Alzheimer's disease Maternal Grandfather    Hypertension Daughter     OBJECTIVE:  Vitals:   03/20/21 1610 03/20/21 1716  BP: (!) 197/121 (!) 186/116  Pulse: 80   Resp: 20   Temp: 98.6 F (37 C)   SpO2: 93%      Physical Exam Vitals and nursing note reviewed.  Constitutional:      General: She is not in acute distress.    Appearance:  Normal appearance. She is normal weight. She is not ill-appearing, toxic-appearing or diaphoretic.  HENT:     Head: Normocephalic.  Cardiovascular:     Rate and Rhythm: Normal rate and regular rhythm.     Pulses: Normal pulses.     Heart sounds: Normal heart sounds. No murmur heard.   No friction rub. No gallop.  Pulmonary:     Effort: Pulmonary effort is normal. No respiratory distress.     Breath sounds: Normal breath sounds. No stridor. No wheezing, rhonchi or rales.  Chest:     Chest wall: No tenderness.  Musculoskeletal:     Right hand: Tenderness present.     Left hand: Tenderness present.     Lumbar back: Tenderness present.  Neurological:     Mental Status: She is alert and oriented to person, place, and time.     GCS: GCS eye subscore is 4. GCS verbal subscore is 5. GCS motor subscore is 6.     Cranial Nerves: Cranial nerves are intact.     Sensory: Sensation is intact.     Motor: Motor function is intact.     Coordination: Coordination is intact.     Gait: Gait is intact.     LABS:  No results found for this or any previous visit (from the past 24 hour(s)).   Patient is stable at discharge.  ASSESSMENT & PLAN:  1. Acute bilateral low back pain without sciatica   2. Pain in both hands     Meds ordered this encounter  Medications   methylPREDNISolone sodium succinate (SOLU-MEDROL) 125 mg/2 mL injection 60 mg     Proper size blood pressure cuff was not available. Therefore blood pressure was checked at the wrist and result is questionable.  She reports she is on for blood pressure medication and she did take her meds as prescribed.  Neuro exam was otherwise normal.  She was advised to go to ER if she develop any worsening of her symptoms  Discharge instructions   Continue to take ibuprofen as prescribed and directed Solu-Medrol IM was given in office Work note was given Follow-up and establish care with PCP Return or go to ED if you develop any new or  worsening of your symptoms   reviewed expectations re: course of current medical issues. Questions answered. Outlined signs and symptoms indicating need for more acute intervention.  Patient verbalized understanding. After Visit Summary given.          Durward Parcel, FNP 03/20/21 1736    Durward Parcel, FNP 03/20/21 1739

## 2021-04-04 ENCOUNTER — Emergency Department (HOSPITAL_COMMUNITY): Payer: 59

## 2021-04-04 ENCOUNTER — Emergency Department (HOSPITAL_COMMUNITY)
Admission: EM | Admit: 2021-04-04 | Discharge: 2021-04-05 | Disposition: A | Discharge status: Home / Self Care | Payer: 59 | Attending: Emergency Medicine | Admitting: Emergency Medicine

## 2021-04-04 ENCOUNTER — Other Ambulatory Visit: Payer: Self-pay

## 2021-04-04 ENCOUNTER — Encounter (HOSPITAL_COMMUNITY): Payer: Self-pay

## 2021-04-04 DIAGNOSIS — I10 Essential (primary) hypertension: Secondary | ICD-10-CM | POA: Insufficient documentation

## 2021-04-04 DIAGNOSIS — R519 Headache, unspecified: Secondary | ICD-10-CM

## 2021-04-04 DIAGNOSIS — E876 Hypokalemia: Secondary | ICD-10-CM | POA: Diagnosis not present

## 2021-04-04 DIAGNOSIS — Z79899 Other long term (current) drug therapy: Secondary | ICD-10-CM | POA: Diagnosis not present

## 2021-04-04 DIAGNOSIS — F17210 Nicotine dependence, cigarettes, uncomplicated: Secondary | ICD-10-CM | POA: Diagnosis not present

## 2021-04-04 LAB — COMPREHENSIVE METABOLIC PANEL
ALT: 15 U/L (ref 0–44)
AST: 19 U/L (ref 15–41)
Albumin: 3.4 g/dL — ABNORMAL LOW (ref 3.5–5.0)
Alkaline Phosphatase: 108 U/L (ref 38–126)
Anion gap: 11 (ref 5–15)
BUN: 20 mg/dL (ref 6–20)
CO2: 30 mmol/L (ref 22–32)
Calcium: 9.3 mg/dL (ref 8.9–10.3)
Chloride: 96 mmol/L — ABNORMAL LOW (ref 98–111)
Creatinine, Ser: 1.36 mg/dL — ABNORMAL HIGH (ref 0.44–1.00)
GFR, Estimated: 46 mL/min — ABNORMAL LOW (ref >60)
Glucose, Bld: 110 mg/dL — ABNORMAL HIGH (ref 70–99)
Potassium: 2.9 mmol/L — ABNORMAL LOW (ref 3.5–5.1)
Sodium: 137 mmol/L (ref 135–145)
Total Bilirubin: 0.6 mg/dL (ref 0.3–1.2)
Total Protein: 8 g/dL (ref 6.5–8.1)

## 2021-04-04 LAB — CBC
HCT: 47.5 % — ABNORMAL HIGH (ref 36.0–46.0)
Hemoglobin: 14.6 g/dL (ref 12.0–15.0)
MCH: 23.7 pg — ABNORMAL LOW (ref 26.0–34.0)
MCHC: 30.7 g/dL (ref 30.0–36.0)
MCV: 77.2 fL — ABNORMAL LOW (ref 80.0–100.0)
Platelets: 328 10*3/uL (ref 150–400)
RBC: 6.15 MIL/uL — ABNORMAL HIGH (ref 3.87–5.11)
RDW: 14.4 % (ref 11.5–15.5)
WBC: 13 10*3/uL — ABNORMAL HIGH (ref 4.0–10.5)
nRBC: 0 % (ref 0.0–0.2)

## 2021-04-04 LAB — DIFFERENTIAL
Abs Immature Granulocytes: 0.04 10*3/uL (ref 0.00–0.07)
Basophils Absolute: 0.1 10*3/uL (ref 0.0–0.1)
Basophils Relative: 0 %
Eosinophils Absolute: 0.1 10*3/uL (ref 0.0–0.5)
Eosinophils Relative: 1 %
Immature Granulocytes: 0 %
Lymphocytes Relative: 21 %
Lymphs Abs: 2.7 10*3/uL (ref 0.7–4.0)
Monocytes Absolute: 0.6 10*3/uL (ref 0.1–1.0)
Monocytes Relative: 5 %
Neutro Abs: 9.6 10*3/uL — ABNORMAL HIGH (ref 1.7–7.7)
Neutrophils Relative %: 73 %

## 2021-04-04 LAB — PROTIME-INR
INR: 1 (ref 0.8–1.2)
Prothrombin Time: 12.7 seconds (ref 11.4–15.2)

## 2021-04-04 LAB — APTT: aPTT: 31 seconds (ref 24–36)

## 2021-04-04 IMAGING — CT CT HEAD W/O CM
4 series · 16 of 47 positions shown, 18 images · non-contrast
Comparison: None.

CLINICAL DATA: Neuro deficit, acute, stroke suspected. Headache.
Blurred vision

EXAM:
CT HEAD WITHOUT CONTRAST
TECHNIQUE: Contiguous axial images were obtained from the base of the skull
through the vertex without intravenous contrast.

[Series 3: head bone · axial · 0.41mm/px · z∈[-88,-56]mm · 3 of 81 slices shown]
[im 9/81  bone]
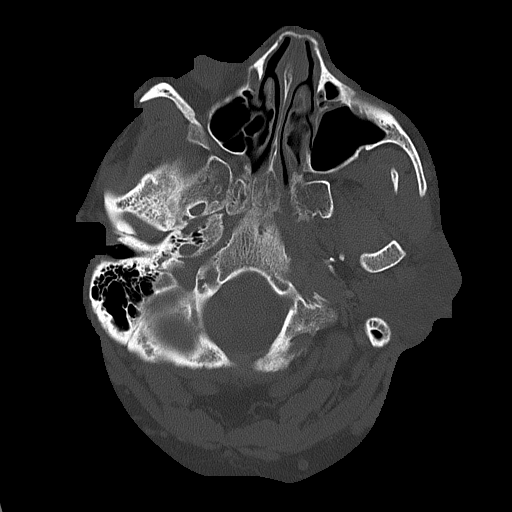
[im 17/81  bone]
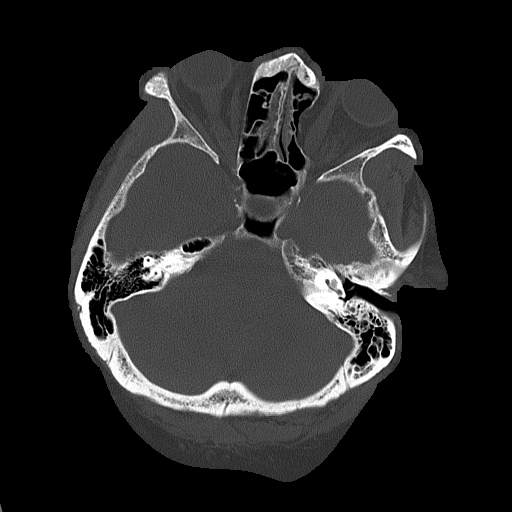
[im 25/81  bone]
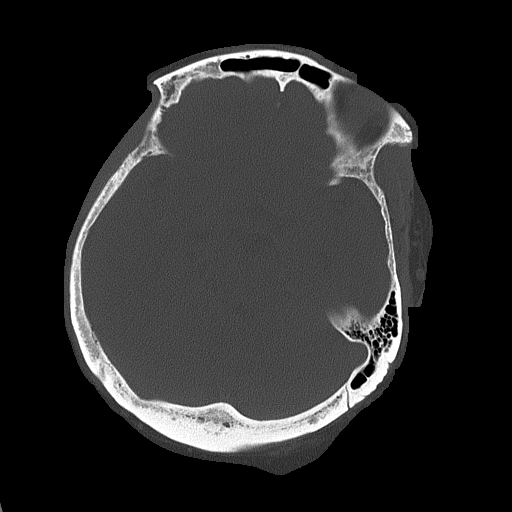

[Series 4: head without · axial · non-contrast · 0.41mm/px · z∈[-84,+36]mm · 7 of 33 slices shown, 9 images]
[im 5/33  brain]
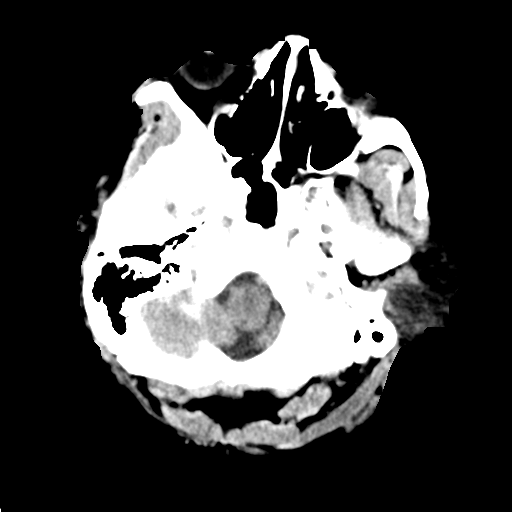
[im 5/33  bone]
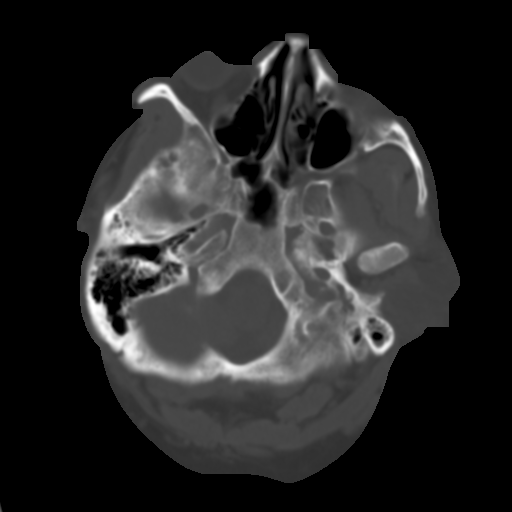
[im 9/33  brain]
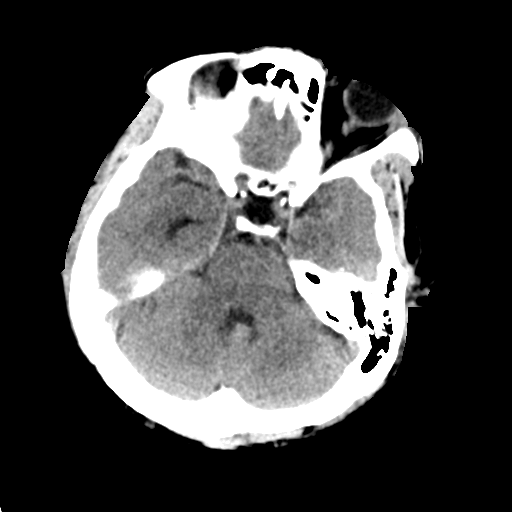
[im 13/33  brain]
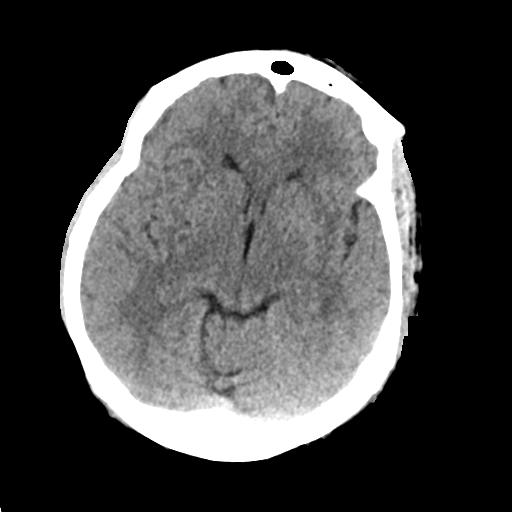
[im 17/33  brain]
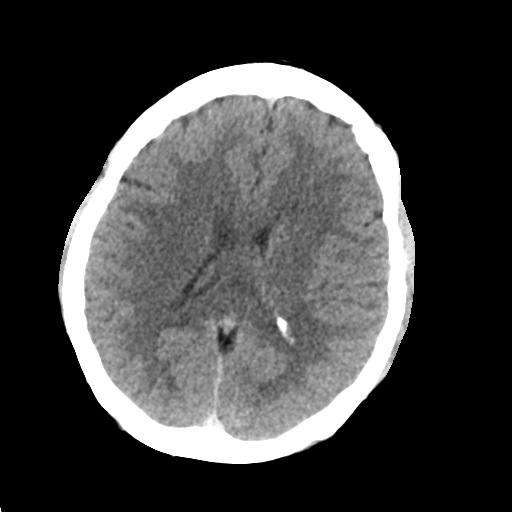
[im 21/33  brain]
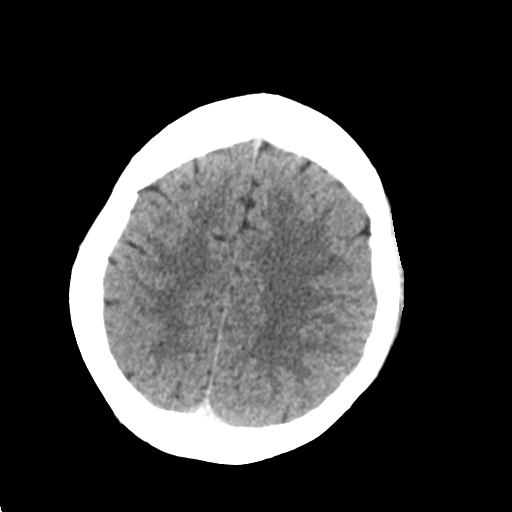
[im 21/33  bone]
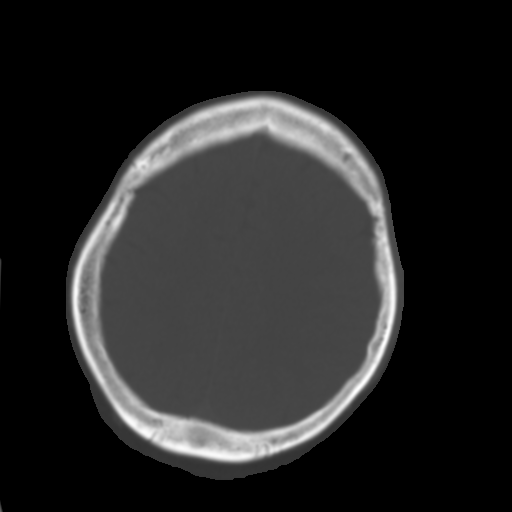
[im 25/33  brain]
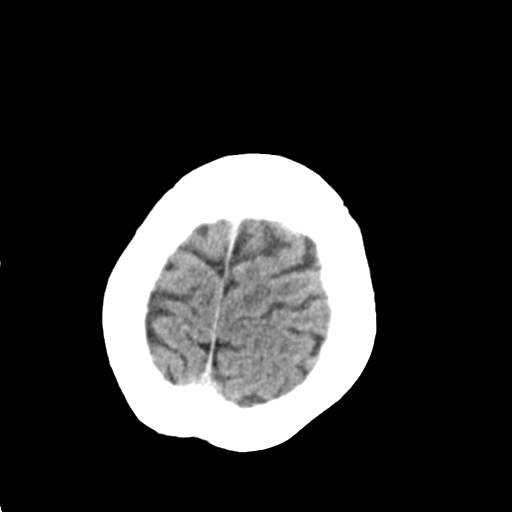
[im 29/33  brain]
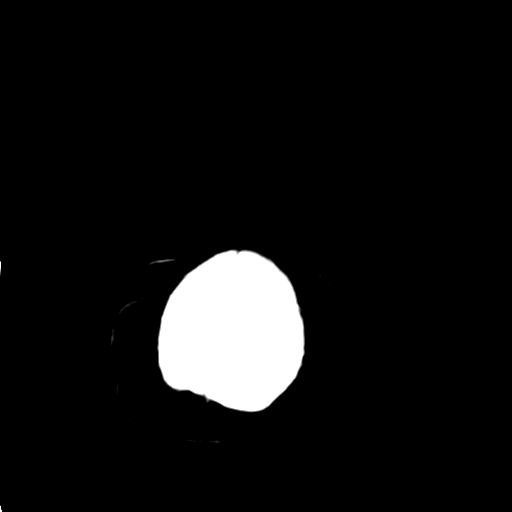

[Series 5: head without cor · coronal · non-contrast · 0.32mm/px · 3 of 67 slices shown]
[im 23/67  brain]
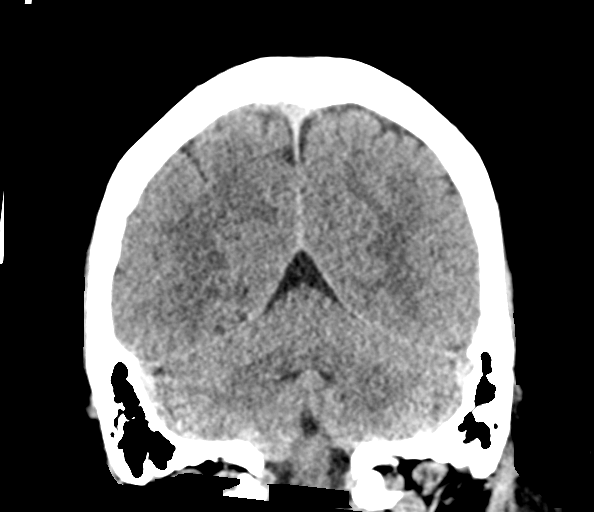
[im 30/67  brain]
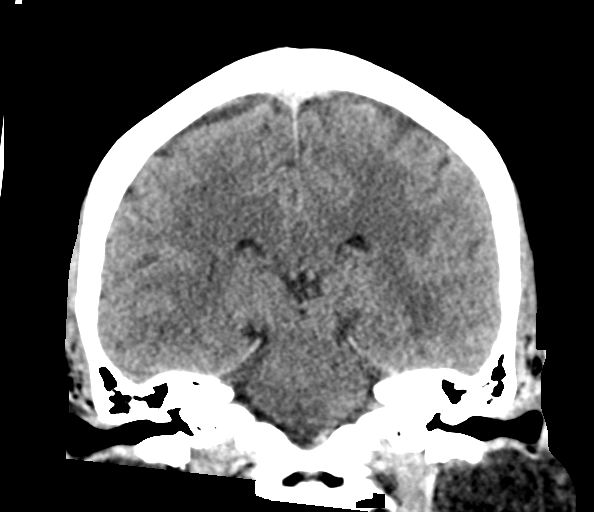
[im 37/67  brain]
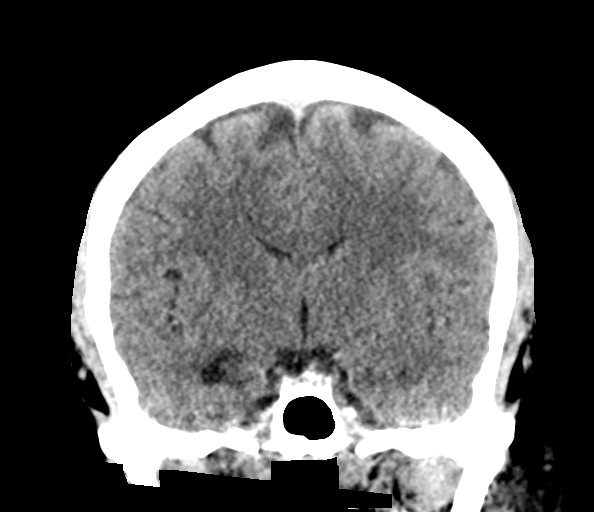

[Series 6: head without sag · sagittal · non-contrast · 0.32mm/px · 3 of 60 slices shown]
[im 20/60  brain]
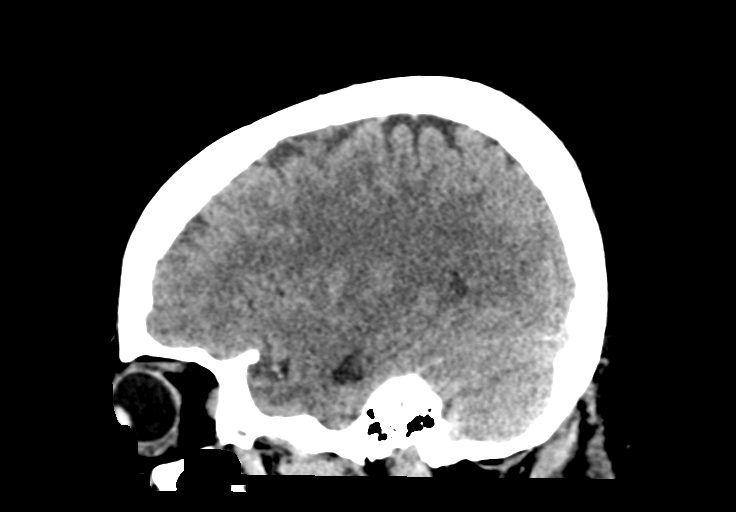
[im 30/60  brain]
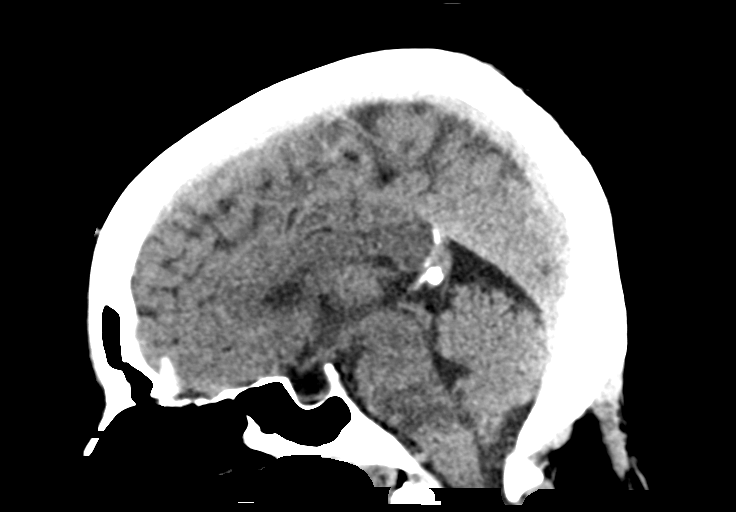
[im 40/60  brain]
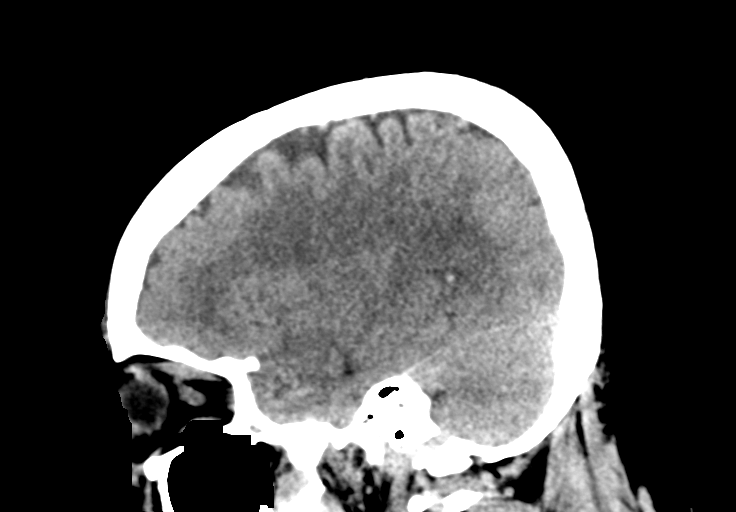

[16 of 47 positions shown; findings below may reference images not displayed]

FINDINGS: Brain: No acute intracranial abnormality. Specifically, no
hemorrhage, hydrocephalus, mass lesion, acute infarction, or
significant intracranial injury. Patchy low-density throughout the
deep white matter, likely chronic small vessel disease.

Vascular: No hyperdense vessel or unexpected calcification.

Skull: No acute calvarial abnormality.

Sinuses/Orbits: Visualized paranasal sinuses and mastoids clear.
Orbital soft tissues unremarkable.

Other: None
IMPRESSION: Patchy white matter disease, likely chronic small vessel disease.

No acute intracranial abnormality.

## 2021-04-04 MED ORDER — SODIUM CHLORIDE 0.9% FLUSH: 3.0000 mL | Freq: Once | INTRAVENOUS | Status: DC

## 2021-04-04 NOTE — ED Triage Notes (Signed)
Patient complains of frontal headache and pain to right eye with blurred vision x 2 days. Patient alert and oriented, denies trauma. States that she thinks all related to her BP, noticed yesterday that her BP medication has expired

## 2021-04-05 ENCOUNTER — Telehealth: Payer: Self-pay | Admitting: Surgery

## 2021-04-05 ENCOUNTER — Emergency Department (HOSPITAL_COMMUNITY): Payer: 59

## 2021-04-05 ENCOUNTER — Other Ambulatory Visit: Payer: Self-pay | Admitting: Family Medicine

## 2021-04-05 DIAGNOSIS — I10 Essential (primary) hypertension: Secondary | ICD-10-CM

## 2021-04-05 LAB — TROPONIN I (HIGH SENSITIVITY)
Troponin I (High Sensitivity): 9 ng/L (ref <18)
Troponin I (High Sensitivity): 8 ng/L (ref <18)

## 2021-04-05 LAB — CBG MONITORING, ED: Glucose-Capillary: 106 mg/dL — ABNORMAL HIGH (ref 70–99)

## 2021-04-05 IMAGING — MR MR MRV HEAD WO/W CM
5 of 8 series · 38 of 48 positions shown · IV contrast (Gadavist)
Comparison: Head CT [DATE].

CLINICAL DATA: 55-year-old female with frontal headache. Right eye
pain and blurred vision for 2 days.

EXAM:
MR VENOGRAM HEAD WITHOUT AND WITH CONTRAST
TECHNIQUE: Angiographic images of the intracranial venous structures were
acquired using MRV technique without and with intravenous contrast.
CONTRAST:  8mL GADAVIST GADOBUTROL 1 MMOL/ML IV SOLN

[Series 2: tof_fl2d_paracor · coronal · 2.0mm · 0.98mm/px · 6 of 148 slices shown]
[im 1/148]
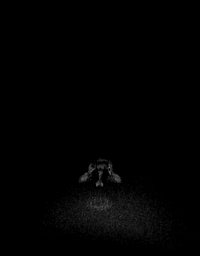
[im 30/148]
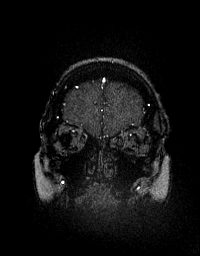
[im 59/148]
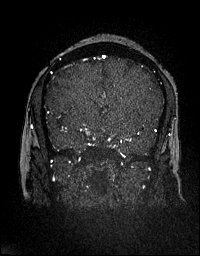
[im 89/148]
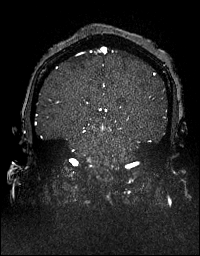
[im 118/148]
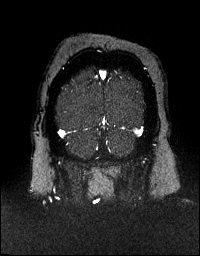
[im 148/148]
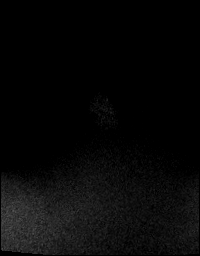

[Series 6: venous inhance coronal · coronal · portal-venous · 1.0mm · 0.60mm/px · 9 of 208 slices shown]
[im 1/208]
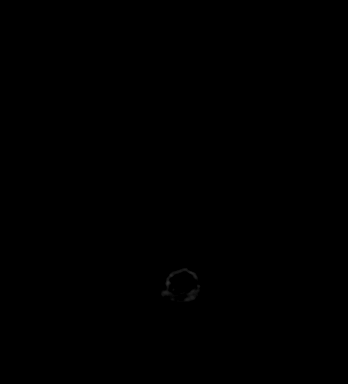
[im 26/208]
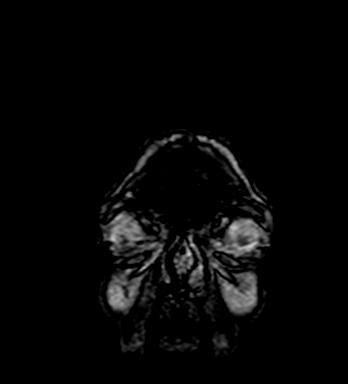
[im 52/208]
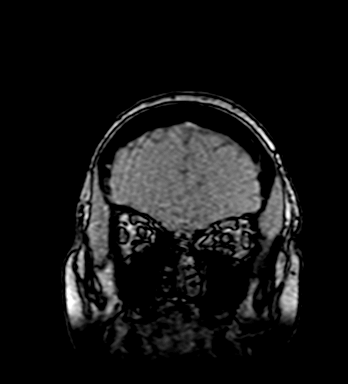
[im 78/208]
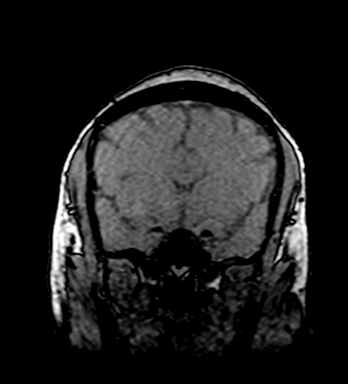
[im 104/208]
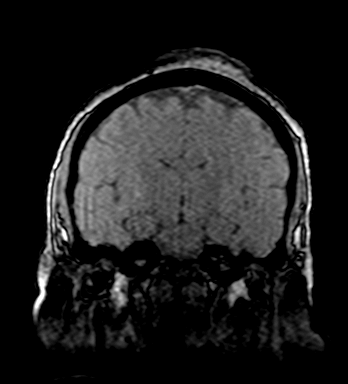
[im 130/208]
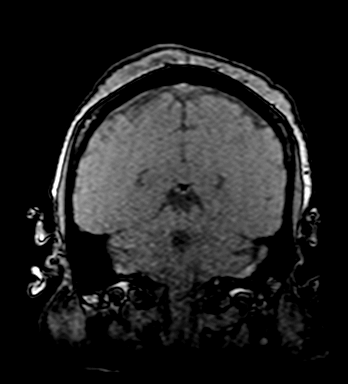
[im 156/208]
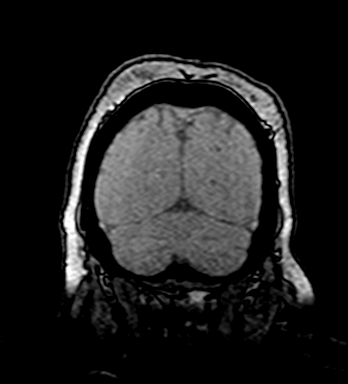
[im 182/208]
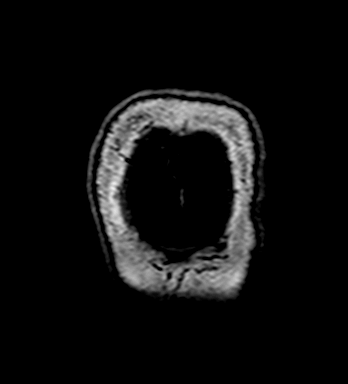
[im 208/208]
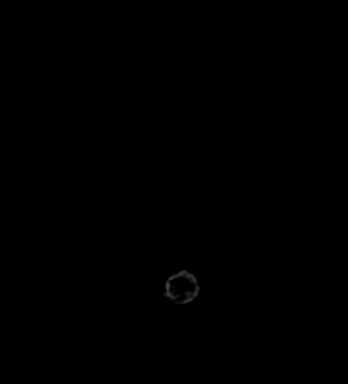

[Series 7: venous inhance coronal_msum · coronal · portal-venous · 1.0mm · 0.60mm/px · 8 of 202 slices shown]
[im 1/202]
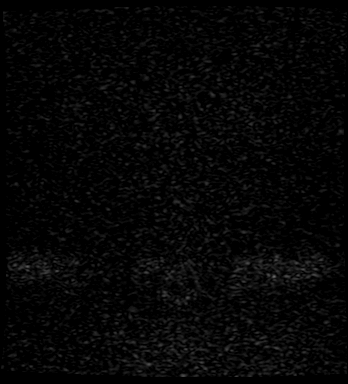
[im 23/202]
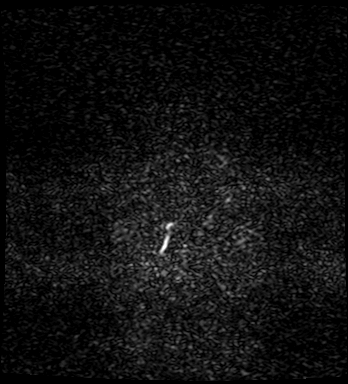
[im 68/202]
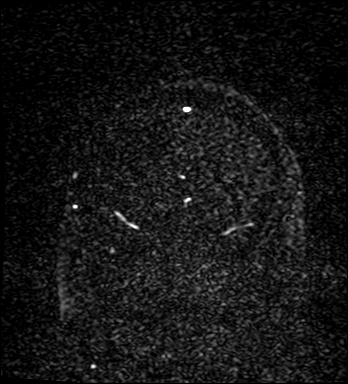
[im 90/202]
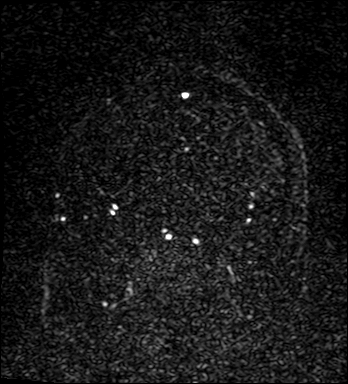
[im 112/202]
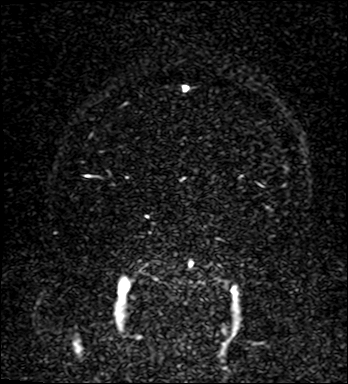
[im 135/202]
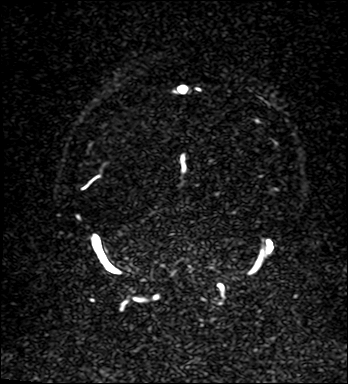
[im 179/202]
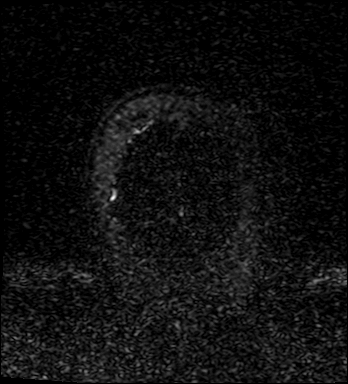
[im 202/202]
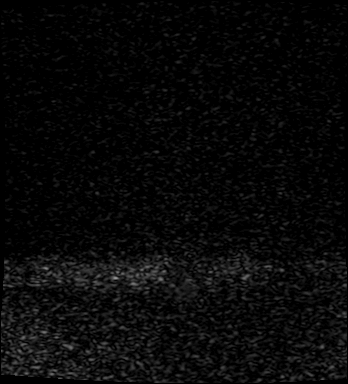

[Series 10: t1_fl3d_sag_p2_iso · sagittal · 1.0mm · 0.93mm/px · 9 of 192 slices shown]
[im 1/192]
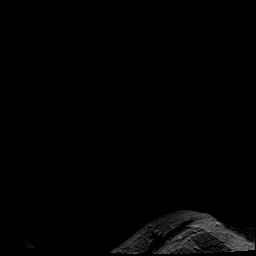
[im 24/192]
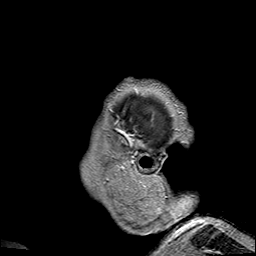
[im 48/192]
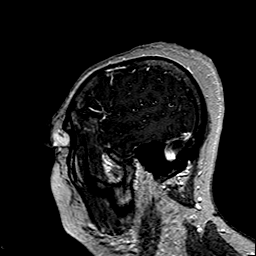
[im 72/192]
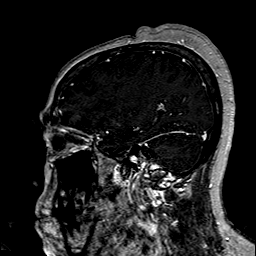
[im 96/192]
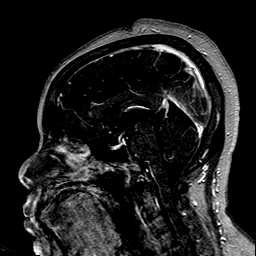
[im 120/192]
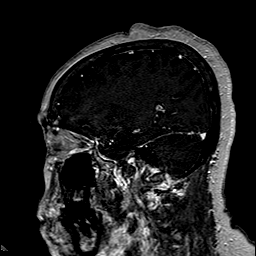
[im 144/192]
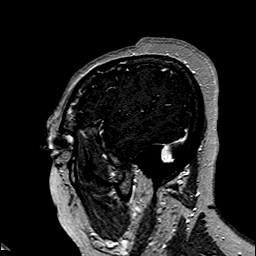
[im 168/192]
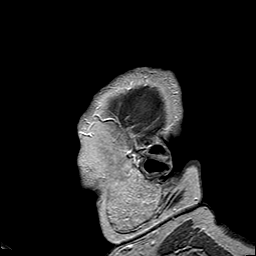
[im 192/192]
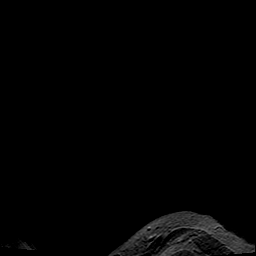

[Series 11: t1_fl3d_sag_p2_iso_mpr_ axial · axial · 2.0mm · 0.45mm/px · z∈[-133,+97]mm · 6 of 116 slices shown]
[im 1/116]
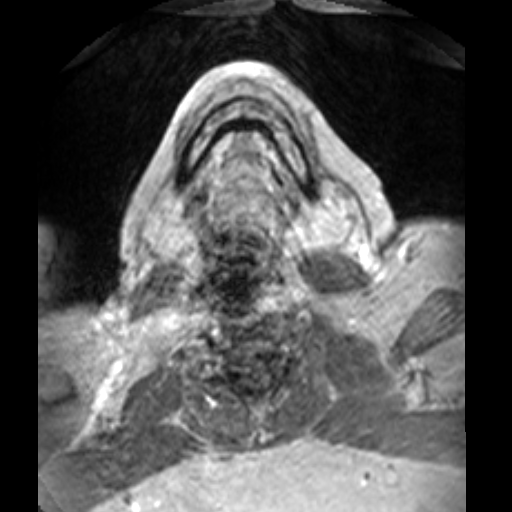
[im 24/116]
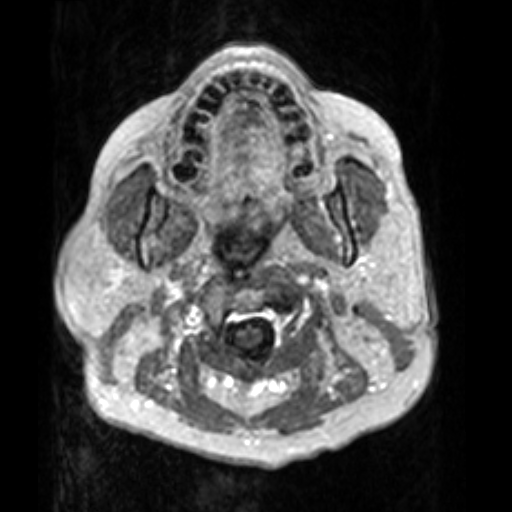
[im 47/116]
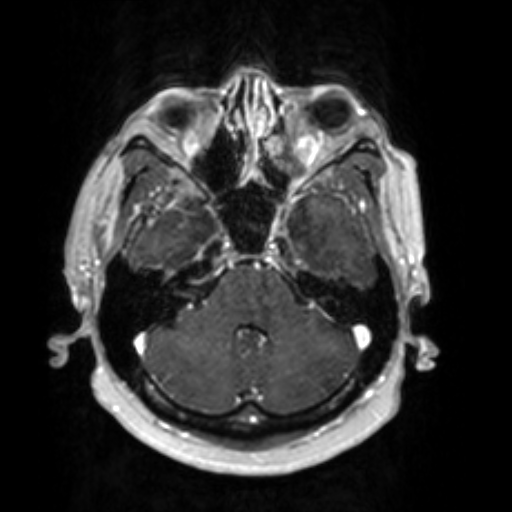
[im 70/116]
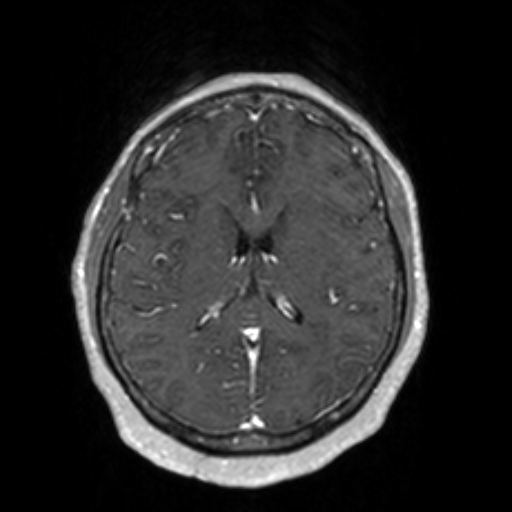
[im 93/116]
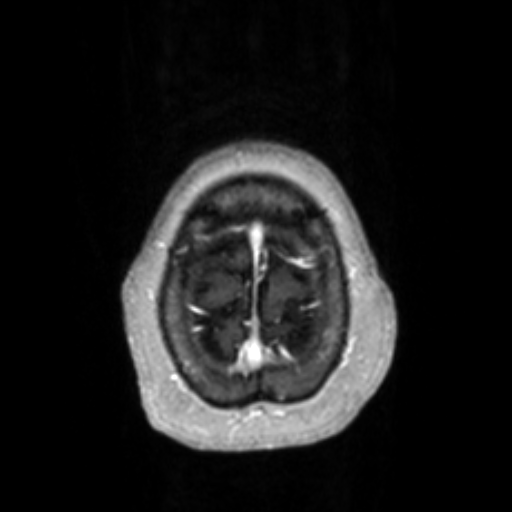
[im 116/116]
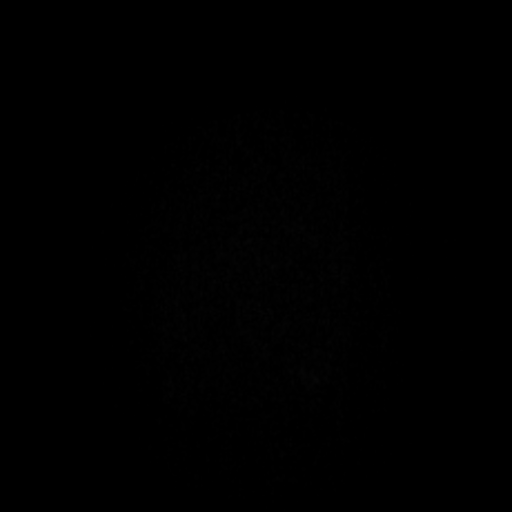

[38 of 48 positions shown; findings below may reference images not displayed]

FINDINGS: Cerebral volume appears to be normal. No midline shift or
intracranial mass effect. No ventriculomegaly.

On postcontrast only T1 weighted imaging of the brain no enhancing
mass, abnormal enhancement or dural thickening is identified.

Partially visible cervical spine degeneration. Visualized bone
marrow signal is within normal limits. No suprasellar mass or mass
effect.

Chronic left lamina papyracea fracture better demonstrated by CT.
Otherwise bilateral orbits soft tissues appears symmetric, grossly
normal.

Paranasal sinuses and mastoids are stable and well aerated.

Precontrast time-of-flight and post-contrast intracranial MRV.

Preserved flow signal in the superior sagittal sinus, torcula,
straight sinus, vein of [place]RA[place], internal cerebral veins. Preserved
flow signal in the bilateral transverse and sigmoid sinuses,
bilateral IJ bulbs. Cavernous sinus appears unremarkable.

And following contrast, though same major dural venous sinuses are
enhancing and appear to be patent.
IMPRESSION: 1. Negative for dural venous sinus thrombosis.
2. Chronic left lamina papyracea fracture.

## 2021-04-05 MED ORDER — DIPHENHYDRAMINE HCL 50 MG/ML IJ SOLN
12.5000 mg | Freq: Once | INTRAMUSCULAR | Status: AC
  Administered 2021-04-05: 12.5 mg via INTRAVENOUS
  Filled 2021-04-05: qty 1

## 2021-04-05 MED ORDER — INDAPAMIDE 2.5 MG PO TABS: 5.0000 mg | ORAL_TABLET | Freq: Every day | ORAL | 0 refills | Status: DC

## 2021-04-05 MED ORDER — POTASSIUM CHLORIDE 10 MEQ/100ML IV SOLN
10.0000 meq | INTRAVENOUS | Status: AC
  Administered 2021-04-05 (×2): 10 meq via INTRAVENOUS
  Filled 2021-04-05 (×2): qty 100

## 2021-04-05 MED ORDER — GADOBUTROL 1 MMOL/ML IV SOLN
8.0000 mL | Freq: Once | INTRAVENOUS | Status: AC | PRN
  Administered 2021-04-05: 8 mL via INTRAVENOUS

## 2021-04-05 MED ORDER — AMLODIPINE BESYLATE 5 MG PO TABS: 5.0000 mg | ORAL_TABLET | Freq: Every day | ORAL | 3 refills | Status: DC

## 2021-04-05 MED ORDER — PROCHLORPERAZINE EDISYLATE 10 MG/2ML IJ SOLN
10.0000 mg | Freq: Once | INTRAMUSCULAR | Status: AC
  Administered 2021-04-05: 10 mg via INTRAVENOUS
  Filled 2021-04-05: qty 2

## 2021-04-05 MED ORDER — POTASSIUM CHLORIDE 20 MEQ PO PACK: 40.0000 meq | PACK | Freq: Every day | ORAL | 1 refills | Status: DC

## 2021-04-05 MED ORDER — ACETAMINOPHEN 500 MG PO TABS
1000.0000 mg | ORAL_TABLET | Freq: Once | ORAL | Status: AC
  Administered 2021-04-05: 1000 mg via ORAL
  Filled 2021-04-05: qty 2

## 2021-04-05 NOTE — ED Notes (Signed)
Pt in MRI.

## 2021-04-05 NOTE — ED Notes (Signed)
PT reports blurred vision continues

## 2021-04-05 NOTE — ED Notes (Signed)
Visual acuity: 20/32 bilateral

## 2021-04-05 NOTE — Telephone Encounter (Signed)
ED CM received call from patient and daughter concerning patient receiving prescription today that were not signed and the would not accept the scripts.  ED RNCM will call in prescriptions to [facility] on [address].

## 2021-04-05 NOTE — ED Notes (Signed)
MRI Notified that the pt's drip is done running and she is ready for MRI

## 2021-04-05 NOTE — Discharge Instructions (Signed)
You were seen today for headache and high blood pressure.  Your imaging is reassuring.  Make sure to take your blood pressure medications as directed.  Follow-up with your primary physician for blood pressure recheck and ongoing management of your blood pressure medications.

## 2021-04-05 NOTE — ED Provider Notes (Signed)
[place] EMERGENCY DEPARTMENT Provider Note   CSN: [id number] Arrival date & time: 04/04/21  1634     History No chief complaint on file.   Janice [name] is a 55 y.o. female.  HPI     This is a 55 year old female with a history of hypertension, obesity, depression who presents with headache and vision changes.  Patient reports 2-day history of frontal throbbing headache.  She states that it has been constant.  Nothing seems makes it better or worse.  She has had some blurry vision and photophobia.  She denies visual field deficits or double vision.  No known history of migraines.  She rates her pain at 8 out of 10.  She states that she noted her blood pressure medication was expired.  She has been taking it daily.  She states that on the waiting room she "fell out."  She is not sure whether she lost consciousness or had a true syncopal event.  She also reports that she had some intermittent chest discomfort.  Currently chest pain-free.  No shortness of breath.  Past Medical History:  Diagnosis Date   Depression    history of   GERD (gastroesophageal reflux disease)    acid reflux at night and with spicy foods   Hypertension    Morbid obesity with BMI of 40.0-44.9, adult [facility])     Patient Active Problem List   Diagnosis Date Noted   Pap smear abnormality of cervix/human papillomavirus (HPV) positive 11/06/2019   Trichomonas infection 11/06/2019   Prediabetes 04/04/2019   Perimenopausal 03/06/2019   Psoriasis 03/06/2019   Vitamin D deficiency 09/29/2015   CHEST PAIN 10/04/2007   OBESITY, NOS 08/02/2006   TOBACCO DEPENDENCE 08/02/2006   Depressive disorder, not elsewhere classified 08/02/2006   HYPERTENSION, BENIGN SYSTEMIC 08/02/2006   Esophageal reflux 08/02/2006    Past Surgical History:  Procedure Laterality Date   CESAREAN SECTION     x2     OB History   No obstetric history on file.     Family History  Problem Relation Age of Onset    Hypertension Mother    Kidney disease Mother    Alcohol abuse Father    Depression Father    Drug abuse Father    Hypertension Father    Hypertension Maternal Grandmother    Stroke Maternal Grandmother    Alzheimer's disease Maternal Grandfather    Hypertension Daughter     Social History   Tobacco Use   Smoking status: Every Day    Packs/day: 0.25    Years: 40.00    Pack years: 10.00    Types: Cigarettes   Smokeless tobacco: Never   Tobacco comments:    Wants to quit in 2021  Vaping Use   Vaping Use: Never used  Substance Use Topics   Alcohol use: Yes    Comment: occ, 2 drinks   Drug use: No    Home Medications Prior to Admission medications   Medication Sig Start Date End Date Taking? Authorizing Provider  naproxen sodium (ALEVE) 220 MG tablet Take 220 mg by mouth daily as needed (pain/headache).   Yes [provider]  tiZANidine (ZANAFLEX) 2 MG tablet Take 1 tablet (2 mg total) by mouth every 8 (eight) hours as needed for muscle spasms. 03/07/21  Yes Hazel Sams, PA-C  adapalene (DIFFERIN) 0.1 % gel Apply topically at bedtime. Patient not taking: No sig reported 10/15/19   Shirley, [place], DO  amLODipine (NORVASC) 5 MG tablet  Take 1 tablet (5 mg total) by mouth at bedtime. 04/05/21   Kameo Bains, Mayer Masker, MD  ibuprofen (ADVIL) 600 MG tablet Take 1 tablet (600 mg total) by mouth every 6 (six) hours as needed. Patient not taking: No sig reported 03/14/21   Merrilee Jansky, MD  indapamide (LOZOL) 2.5 MG tablet Take 2 tablets (5 mg total) by mouth daily. 04/05/21   Etna Forquer, Mayer Masker, MD  metroNIDAZOLE (FLAGYL) 500 MG tablet Take 1 tablet (500 mg total) by mouth 2 (two) times daily. Patient not taking: No sig reported 11/06/19   Shirlean Mylar, MD  naproxen (NAPROSYN) 500 MG tablet Take 1 tablet (500 mg total) by mouth 2 (two) times daily with a meal. Patient not taking: No sig reported 01/25/21   Meccariello, Solmon Ice, DO  potassium chloride (KLOR-CON) 20 MEQ  packet Take 40 mEq by mouth daily. 04/05/21   Merrell Borsuk, Mayer Masker, MD  tazarotene (TAZORAC) 0.05 % gel Apply topically at bedtime. Patient not taking: No sig reported 10/17/19   Shirley, [place], DO    Allergies    Losartan and Neomycin sulfate  Review of Systems   Review of Systems  Constitutional:  Negative for fever.  Eyes:  Positive for photophobia and visual disturbance. Negative for pain and redness.  Respiratory:  Negative for shortness of breath.   Cardiovascular:  Positive for chest pain.  Gastrointestinal:  Negative for abdominal pain, nausea and vomiting.  Genitourinary:  Negative for dysuria.  All other systems reviewed and are negative.  Physical Exam Updated Vital Signs BP (!) 183/98 (BP Location: Right Arm)   Pulse 63   Temp 98.5 F (36.9 C) (Oral)   Resp 17   SpO2 97%   Physical Exam Vitals and nursing note reviewed.  Constitutional:      Appearance: She is well-developed. She is obese.  HENT:     Head: Normocephalic and atraumatic.     Nose: Nose normal.     Mouth/Throat:     Mouth: Mucous membranes are moist.  Eyes:     Extraocular Movements: Extraocular movements intact.     Pupils: Pupils are equal, round, and reactive to light.  Cardiovascular:     Rate and Rhythm: Normal rate and regular rhythm.     Heart sounds: Normal heart sounds.  Pulmonary:     Effort: Pulmonary effort is normal. No respiratory distress.     Breath sounds: No wheezing.  Abdominal:     General: Bowel sounds are normal.     Palpations: Abdomen is soft.  Musculoskeletal:     Cervical back: Neck supple.     Right lower leg: No edema.     Left lower leg: No edema.  Skin:    General: Skin is warm and dry.  Neurological:     Mental Status: She is alert and oriented to person, place, and time.     Comments: Follows commands appropriately but seems slow to follow commands, 5 out of 5 strength in all 4 extremities, no drift, visual fields intact  Psychiatric:        Mood and  Affect: Mood normal.    ED Results / Procedures / Treatments   Labs (all labs ordered are listed, but only abnormal results are displayed) Labs Reviewed  CBC - Abnormal; Notable for the following components:      Result Value   WBC 13.0 (*)    RBC 6.15 (*)    HCT 47.5 (*)    MCV 77.2 (*)  MCH 23.7 (*)    All other components within normal limits  DIFFERENTIAL - Abnormal; Notable for the following components:   Neutro Abs 9.6 (*)    All other components within normal limits  COMPREHENSIVE METABOLIC PANEL - Abnormal; Notable for the following components:   Potassium 2.9 (*)    Chloride 96 (*)    Glucose, Bld 110 (*)    Creatinine, Ser 1.36 (*)    Albumin 3.4 (*)    GFR, Estimated 46 (*)    All other components within normal limits  CBG MONITORING, ED - Abnormal; Notable for the following components:   Glucose-Capillary 106 (*)    All other components within normal limits  PROTIME-INR  APTT  TROPONIN I (HIGH SENSITIVITY)  TROPONIN I (HIGH SENSITIVITY)    EKG EKG Interpretation  Date/Time:  Tuesday April 05 2021 00:14:32 EDT Ventricular Rate:  73 PR Interval:  178 QRS Duration: 104 QT Interval:  428 QTC Calculation: 472 R Axis:   25 Text Interpretation: Sinus rhythm Biatrial enlargement Borderline T wave abnormalities Confirmed by Thayer Jew [phone]) on 04/05/2021 12:17:01 AM  Radiology CT HEAD WO CONTRAST  Result Date: 04/04/2021 CLINICAL DATA:  Neuro deficit, acute, stroke suspected. Headache. Blurred vision EXAM: CT HEAD WITHOUT CONTRAST TECHNIQUE: Contiguous axial images were obtained from the base of the skull through the vertex without intravenous contrast. COMPARISON:  None. FINDINGS: Brain: No acute intracranial abnormality. Specifically, no hemorrhage, hydrocephalus, mass lesion, acute infarction, or significant intracranial injury. Patchy low-density throughout the deep white matter, likely chronic small vessel disease. Vascular: No hyperdense vessel  or unexpected calcification. Skull: No acute calvarial abnormality. Sinuses/Orbits: Visualized paranasal sinuses and mastoids clear. Orbital soft tissues unremarkable. Other: None IMPRESSION: Patchy white matter disease, likely chronic small vessel disease. No acute intracranial abnormality. Electronically Signed   By: Rolm Baptise M.D.   On: 04/04/2021 22:44   MR MRV HEAD W WO CONTRAST  Result Date: 04/05/2021 CLINICAL DATA:  55 year old female with frontal headache. Right eye pain and blurred vision for 2 days. EXAM: MR VENOGRAM HEAD WITHOUT AND WITH CONTRAST TECHNIQUE: Angiographic images of the intracranial venous structures were acquired using MRV technique without and with intravenous contrast. CONTRAST:  61mL GADAVIST GADOBUTROL 1 MMOL/ML IV SOLN COMPARISON:  Head CT 04/04/2021. FINDINGS: Cerebral volume appears to be normal. No midline shift or intracranial mass effect. No ventriculomegaly. On postcontrast only T1 weighted imaging of the brain no enhancing mass, abnormal enhancement or dural thickening is identified. Partially visible cervical spine degeneration. Visualized bone marrow signal is within normal limits. No suprasellar mass or mass effect. Chronic left lamina papyracea fracture better demonstrated by CT. Otherwise bilateral orbits soft tissues appears symmetric, grossly normal. Paranasal sinuses and mastoids are stable and well aerated. Precontrast time-of-flight and post-contrast intracranial MRV. Preserved flow signal in the superior sagittal sinus, torcula, straight sinus, vein of Gal[place], internal cerebral veins. Preserved flow signal in the bilateral transverse and sigmoid sinuses, bilateral IJ bulbs. Cavernous sinus appears unremarkable. And following contrast, though same major dural venous sinuses are enhancing and appear to be patent. IMPRESSION: 1. Negative for dural venous sinus thrombosis. 2. Chronic left lamina papyracea fracture. Electronically Signed   By: Genevie Ann M.D.   On:  04/05/2021 04:59    Procedures .Critical Care Performed by: Merryl Hacker, MD Authorized by: Merryl Hacker, MD   Critical care provider statement:    Critical care time (minutes):  40   Critical care was necessary to treat or prevent imminent  or life-threatening deterioration of the following conditions:  Metabolic crisis   Critical care was time spent personally by me on the following activities:  Evaluation of patient's response to treatment, ordering and performing treatments and interventions, ordering and review of laboratory studies, ordering and review of radiographic studies, pulse oximetry and re-evaluation of patient's condition   Medications Ordered in ED Medications  sodium chloride flush (NS) 0.9 % injection 3 mL (3 mLs Intravenous Not Given 04/05/21 0143)  diphenhydrAMINE (BENADRYL) injection 12.5 mg (12.5 mg Intravenous Given 04/05/21 0056)  prochlorperazine (COMPAZINE) injection 10 mg (10 mg Intravenous Given 04/05/21 0056)  potassium chloride 10 mEq in 100 mL IVPB (10 mEq Intravenous New Bag/Given 04/05/21 0416)  gadobutrol (GADAVIST) 1 MMOL/ML injection 8 mL (8 mLs Intravenous Contrast Given 04/05/21 0432)  acetaminophen (TYLENOL) tablet 1,000 mg (1,000 mg Oral Given 04/05/21 0443)    ED Course  I have reviewed the triage vital signs and the nursing notes.  Pertinent labs & imaging results that were available during my care of the patient were reviewed by me and considered in my medical decision making (see chart for details).  Clinical Course as of 04/05/21 0524  Tue Apr 05, 2021  0522 Patient states she feels much better.  MRI does not show any evidence of venous dural thrombosis or other significant abnormality.  Suspect blood pressure may be related.  She does not appear to have any evidence of hypertensive urgency or emergency.  We will refill her Norvasc and Lozol.  Also will refill her potassium given that she was hypokalemic.  Follow-up with primary  physician advised. [CH]    Clinical Course User Index [CH] Melodye Swor, Barbette Hair, MD   MDM Rules/Calculators/A&P                           Patient presents with headache and vision changes.  She relates this to her blood pressure.  She is nontoxic and vital signs are notable for elevated blood pressure.  Initial blood pressure 214/129.  This trended downward into the 1 60-1 80 range.  Patient was given migraine medication.  Labs obtained and significant for hypokalemia.  This was replaced by IV.  CT head is negative for acute bleed.  It does show some white matter changes.  Other considerations include but not limited to, hypertensive urgency, emergency, venous dural thrombosis.  MRI ordered.  See clinical course above.  Patient had overall improvement in blood pressures down trended.  We will refill her blood pressure medication and have her follow-up closely with her primary physician.  Additionally, she will be discharged with refill of her potassium.  After history, exam, and medical workup I feel the patient has been appropriately medically screened and is safe for discharge home. Pertinent diagnoses were discussed with the patient. Patient was given return precautions.  Final Clinical Impression(s) / ED Diagnoses Final diagnoses:  Acute nonintractable headache, unspecified headache type  Primary hypertension  Hypokalemia    Rx / DC Orders ED Discharge Orders          Ordered    amLODipine (NORVASC) 5 MG tablet  Daily at bedtime        04/05/21 0521    indapamide (LOZOL) 2.5 MG tablet  Daily        04/05/21 0521    potassium chloride (KLOR-CON) 20 MEQ packet  Daily        04/05/21 0523  Shon Baton, MD 04/05/21 [phone]

## 2021-04-07 ENCOUNTER — Emergency Department (HOSPITAL_COMMUNITY)
Admission: EM | Admit: 2021-04-07 | Discharge: 2021-04-08 | Disposition: A | Discharge status: Home / Self Care | Payer: 59 | Attending: Emergency Medicine | Admitting: Emergency Medicine

## 2021-04-07 DIAGNOSIS — R519 Headache, unspecified: Secondary | ICD-10-CM | POA: Insufficient documentation

## 2021-04-07 DIAGNOSIS — R079 Chest pain, unspecified: Secondary | ICD-10-CM | POA: Diagnosis not present

## 2021-04-07 DIAGNOSIS — E876 Hypokalemia: Secondary | ICD-10-CM | POA: Insufficient documentation

## 2021-04-07 DIAGNOSIS — F17210 Nicotine dependence, cigarettes, uncomplicated: Secondary | ICD-10-CM | POA: Diagnosis not present

## 2021-04-07 DIAGNOSIS — I10 Essential (primary) hypertension: Secondary | ICD-10-CM | POA: Diagnosis not present

## 2021-04-07 NOTE — ED Provider Notes (Signed)
Emergency Medicine Provider Triage Evaluation Note  Janice [name] , a 55 y.o. female  was evaluated in triage.  Pt complains of headache and high blood pressure since being seen and discharged for the same on 10/31.  Patient states that she still had headache when she left the emergency department.  She was newly prescribed amlodipine, and has filled this and been taking it.  She has been unable to make contact with a primary care provider to follow-up with.  Review of Systems  Positive: Headache, lightheadedness Negative: Chest pain, shortness of breath, numbness, tingling  Physical Exam  BP (!) 167/108 (BP Location: Left Arm)   Pulse 98   Temp 99 F (37.2 C) (Oral)   Resp 20   SpO2 97%  Gen:   Awake, no distress   Resp:  Normal effort  MSK:   Moves extremities without difficulty  Other:    Medical Decision Making  Medically screening exam initiated at 9:42 PM.  Appropriate orders placed.  Janice [name] was informed that the remainder of the evaluation will be completed by another provider, this initial triage assessment does not replace that evaluation, and the importance of remaining in the ED until their evaluation is complete.     Janice [name] 04/07/21 2142    Janice Sleeper, MD 04/07/21 2154

## 2021-04-07 NOTE — ED Triage Notes (Signed)
Pt c/o continued HA & HTN since being seen/discharged 10/31 for same. States she filled prescription immediately & has been compliant since.) Unable to find PCP to follow up w.

## 2021-04-08 LAB — CBC
HCT: 44.7 % (ref 36.0–46.0)
Hemoglobin: 13.9 g/dL (ref 12.0–15.0)
MCH: 23.6 pg — ABNORMAL LOW (ref 26.0–34.0)
MCHC: 31.1 g/dL (ref 30.0–36.0)
MCV: 75.9 fL — ABNORMAL LOW (ref 80.0–100.0)
Platelets: 294 10*3/uL (ref 150–400)
RBC: 5.89 MIL/uL — ABNORMAL HIGH (ref 3.87–5.11)
RDW: 14.2 % (ref 11.5–15.5)
WBC: 11.1 10*3/uL — ABNORMAL HIGH (ref 4.0–10.5)
nRBC: 0 % (ref 0.0–0.2)

## 2021-04-08 LAB — BASIC METABOLIC PANEL
Anion gap: 11 (ref 5–15)
BUN: 22 mg/dL — ABNORMAL HIGH (ref 6–20)
CO2: 29 mmol/L (ref 22–32)
Calcium: 9 mg/dL (ref 8.9–10.3)
Chloride: 96 mmol/L — ABNORMAL LOW (ref 98–111)
Creatinine, Ser: 1.27 mg/dL — ABNORMAL HIGH (ref 0.44–1.00)
GFR, Estimated: 50 mL/min — ABNORMAL LOW (ref >60)
Glucose, Bld: 126 mg/dL — ABNORMAL HIGH (ref 70–99)
Potassium: 2.7 mmol/L — CL (ref 3.5–5.1)
Sodium: 136 mmol/L (ref 135–145)

## 2021-04-08 LAB — TROPONIN I (HIGH SENSITIVITY)
Troponin I (High Sensitivity): 18 ng/L — ABNORMAL HIGH (ref <18)
Troponin I (High Sensitivity): 12 ng/L (ref <18)

## 2021-04-08 MED ORDER — POTASSIUM CHLORIDE CRYS ER 20 MEQ PO TBCR
40.0000 meq | EXTENDED_RELEASE_TABLET | Freq: Once | ORAL | Status: AC
  Administered 2021-04-08: 40 meq via ORAL
  Filled 2021-04-08: qty 2

## 2021-04-08 MED ORDER — PROCHLORPERAZINE EDISYLATE 10 MG/2ML IJ SOLN
10.0000 mg | Freq: Once | INTRAMUSCULAR | Status: AC
  Administered 2021-04-08: 10 mg via INTRAVENOUS
  Filled 2021-04-08: qty 2

## 2021-04-08 MED ORDER — SODIUM CHLORIDE 0.9 % IV BOLUS
500.0000 mL | Freq: Once | INTRAVENOUS | Status: AC
  Administered 2021-04-08: 500 mL via INTRAVENOUS

## 2021-04-08 MED ORDER — MAGNESIUM OXIDE -MG SUPPLEMENT 400 (240 MG) MG PO TABS
800.0000 mg | ORAL_TABLET | Freq: Once | ORAL | Status: AC
  Administered 2021-04-08: 800 mg via ORAL
  Filled 2021-04-08: qty 2

## 2021-04-08 MED ORDER — DIPHENHYDRAMINE HCL 50 MG/ML IJ SOLN
25.0000 mg | Freq: Once | INTRAMUSCULAR | Status: AC
  Administered 2021-04-08: 25 mg via INTRAVENOUS
  Filled 2021-04-08: qty 1

## 2021-04-08 MED ORDER — POTASSIUM CHLORIDE 10 MEQ/100ML IV SOLN
10.0000 meq | INTRAVENOUS | Status: AC
  Administered 2021-04-08 (×3): 10 meq via INTRAVENOUS
  Filled 2021-04-08 (×3): qty 100

## 2021-04-08 NOTE — ED Notes (Signed)
Daughter called will be coming to get her.

## 2021-04-08 NOTE — ED Notes (Signed)
States she was here several days ago for same, states her headache hurts worse over right eye. Ni change in pain from earlier , just not going away.

## 2021-04-08 NOTE — Discharge Instructions (Signed)
Your work-up today did show low potassium which we repleted.  Please continue taking your home potassium medication.  Please follow-up with your primary doctor for reassessment as her blood pressure was elevated today but improved after the headache improved.  Your work-up otherwise was reassuring and we feel you are safe for discharge home if symptoms have improved.  Please follow-up with your primary doctor and rest.  If any symptoms change or worsen, please return to the nearest emergency department.

## 2021-04-08 NOTE — ED Provider Notes (Signed)
7:21 AM Care assumed from Dr. Pilar Plate.   At time of transfer care, patient is awaiting delta troponin.  Plan of care be discharged home until troponin is reassuring and her blood pressure remains stable and her headache is still improved.  Anticipate discharge after delta Trop.   She received the potassium repletion via IV.  12:32 PM Delta troponin was negative.  Patient reports headache is resolved and she is feeling much better.  Blood pressure now reassuring.  Patient still stable for discharge, she agreed.  Patient discharged in good condition.   Clinical Impression: 1. Hypokalemia   2. Acute nonintractable headache, unspecified headache type   3. Chest pain, unspecified type     Disposition: Discharge  Condition: Good  I have discussed the results, Dx and Tx plan with the pt(& family if present). He/she/they expressed understanding and agree(s) with the plan. Discharge instructions discussed at great length. Strict return precautions discussed and pt &/or family have verbalized understanding of the instructions. No further questions at time of discharge.    New Prescriptions   No medications on file    Follow Up: [facility] [address] [place] [address] [phone] Schedule an appointment as soon as possible for a visit    [facility] EMERGENCY DEPARTMENT [address] [phone] [address] [address] [phone]       Takao Lizer, Canary Brim, MD 04/08/21 1234

## 2021-04-08 NOTE — ED Provider Notes (Signed)
[facility] DEPT [facility] Emergency Department Provider Note MRN:  [id number]  Arrival date & time: 04/08/21     Chief Complaint   Headache   History of Present Illness   Janice [name] is a 55 y.o. year-old female with a history of hypertension, GERD, obesity presenting to the ED with chief complaint of headache.  Location: Right-sided Duration: 2 days Onset: Gradual Timing: Constant Description: Dull ache Severity: Severe Exacerbating/Alleviating Factors: Worse with bright lights and loud noises Associated Symptoms: Had some chest pain earlier today as well Pertinent Negatives: No shortness of breath, no nausea or vomiting, no numbness or weakness to the arms or legs  Additional History: None  Review of Systems  A complete 10 system review of systems was obtained and all systems are negative except as noted in the HPI and PMH.   Patient's Health History    Past Medical History:  Diagnosis Date   Depression    history of   GERD (gastroesophageal reflux disease)    acid reflux at night and with spicy foods   Hypertension    Morbid obesity with BMI of 40.0-44.9, adult (HCC)     Past Surgical History:  Procedure Laterality Date   CESAREAN SECTION     x2    Family History  Problem Relation Age of Onset   Hypertension Mother    Kidney disease Mother    Alcohol abuse Father    Depression Father    Drug abuse Father    Hypertension Father    Hypertension Maternal Grandmother    Stroke Maternal Grandmother    Alzheimer's disease Maternal Grandfather    Hypertension Daughter     Social History   Socioeconomic History   Marital status: Single    Spouse name: Not on file   Number of children: 3   Years of education: Not on file   Highest education level: Not on file  Occupational History   Not on file  Tobacco Use   Smoking status: Every Day    Packs/day: 0.25    Years: 40.00    Pack years: 10.00    Types: Cigarettes   Smokeless tobacco:  Never   Tobacco comments:    Wants to quit in 2021  Vaping Use   Vaping Use: Never used  Substance and Sexual Activity   Alcohol use: Yes    Comment: occ, 2 drinks   Drug use: No   Sexual activity: Yes    Birth control/protection: Condom  Other Topics Concern   Not on file  Social History Narrative   Not on file   Social Determinants of Health   Financial Resource Strain: Not on file  Food Insecurity: Not on file  Transportation Needs: Not on file  Physical Activity: Not on file  Stress: Not on file  Social Connections: Not on file  Intimate Partner Violence: Not on file     Physical Exam   Vitals:   04/08/21 0530 04/08/21 0600  BP: (!) 179/91 136/90  Pulse: 84 79  Resp:  (!) 23  Temp:    SpO2: 96% 92%    CONSTITUTIONAL: Well-appearing, NAD NEURO:  Alert and oriented x 3, normal and symmetric strength and sensation, normal coordination, normal speech EYES:  eyes equal and reactive ENT/NECK:  no LAD, no JVD CARDIO: Regular rate, well-perfused, normal S1 and S2 PULM:  CTAB no wheezing or rhonchi GI/GU:  normal bowel sounds, non-distended, non-tender MSK/SPINE:  No gross deformities, no edema SKIN:  no rash, atraumatic  PSYCH:  Appropriate speech and behavior  *Additional and/or pertinent findings included in MDM below  Diagnostic and Interventional Summary    EKG Interpretation  Date/Time:  Friday April 08 2021 05:43:55 EDT Ventricular Rate:  79 PR Interval:  177 QRS Duration: 101 QT Interval:  461 QTC Calculation: 529 R Axis:   25 Text Interpretation: Sinus rhythm Ventricular premature complex Consider right atrial enlargement Low voltage, precordial leads Borderline T wave abnormalities Prolonged QT interval Confirmed by Kennis Carina [phone]) on 04/08/2021 7:00:26 AM       Labs Reviewed  CBC - Abnormal; Notable for the following components:      Result Value   WBC 11.1 (*)    RBC 5.89 (*)    MCV 75.9 (*)    MCH 23.6 (*)    All other components  within normal limits  BASIC METABOLIC PANEL - Abnormal; Notable for the following components:   Potassium 2.7 (*)    Chloride 96 (*)    Glucose, Bld 126 (*)    BUN 22 (*)    Creatinine, Ser 1.27 (*)    GFR, Estimated 50 (*)    All other components within normal limits  TROPONIN I (HIGH SENSITIVITY) - Abnormal; Notable for the following components:   Troponin I (High Sensitivity) 18 (*)    All other components within normal limits  TROPONIN I (HIGH SENSITIVITY)    No orders to display    Medications  potassium chloride 10 mEq in 100 mL IVPB (has no administration in time range)  potassium chloride SA (KLOR-CON) CR tablet 40 mEq (has no administration in time range)  magnesium oxide (MAG-OX) tablet 800 mg (has no administration in time range)  diphenhydrAMINE (BENADRYL) injection 25 mg (25 mg Intravenous Given 04/08/21 0553)  prochlorperazine (COMPAZINE) injection 10 mg (10 mg Intravenous Given 04/08/21 0553)  sodium chloride 0.9 % bolus 500 mL (500 mLs Intravenous New Bag/Given 04/08/21 4034)     Procedures  /  Critical Care Procedures  ED Course and Medical Decision Making  I have reviewed the triage vital signs, the nursing notes, and pertinent available records from the EMR.  Listed above are laboratory and imaging tests that I personally ordered, reviewed, and interpreted and then considered in my medical decision making (see below for details).  Considering ACS, hypertensive urgency or emergency.  Recent ED visit with similar symptoms, had a thorough work-up with CT and MRI that were reassuring.  Had some hypokalemia at that time.  With return of chest pain we will repeat EKG, troponin, labs.  Currently no indication for CNS imaging given reassuring neurological exam.  Providing migraine cocktail and will reassess.     Patient's headache is resolved, sleeping comfortably, blood pressure has normalized.  Labs reveal a minimally elevated troponin at 18, worsening hypokalemia at  2.7.  Talked more about her chest pain, which was very mild and fleeting, lasting only a few moments.  Overall doubt cardiac etiology of patient's chest pain, troponin may be elevated due to the hypertension or stress related to her pain.  Will obtain repeat troponin and replete potassium, if troponin is flat and patient continues to look and feel well with a controlled blood pressure, she would be a candidate for discharge.  Signed out to oncoming provider at shift change.  Elmer Sow. Pilar Plate, MD [facility] Emergency Medicine [facility] [email or web address]  Final Clinical Impressions(s) / ED Diagnoses     ICD-10-CM   1. Hypokalemia  E87.6  2. Acute nonintractable headache, unspecified headache type  R51.9     3. Chest pain, unspecified type  R07.9       ED Discharge Orders     None        Discharge Instructions Discussed with and Provided to Patient:   Discharge Instructions   None       Sabas Sous, MD 04/08/21 [phone]

## 2021-04-11 ENCOUNTER — Emergency Department (HOSPITAL_COMMUNITY): Admission: EM | Admit: 2021-04-11 | Discharge: 2021-04-12 | Discharge status: Against Medical Advice | Payer: 59

## 2021-04-11 NOTE — ED Provider Notes (Cosign Needed)
Emergency Medicine Provider Triage Evaluation Note  Janice [name] , a 55 y.o. female  was evaluated in triage.  Pt complains of right-sided headache associated with bilateral blurry vision, worse in the right eye.  Patient states headache has been waxing and waning for the past week.  Patient evaluated in the ED on 10/31 and 11/3 for the same complaint.  CT head and MRI negative for any acute abnormalities.  Patient has not been evaluated by neurologist.  No history of migraines.  Denies speech changes, unilateral weakness.  She endorses feeling off balance.  No head injury.  She is not currently on blood thinners.  Review of Systems  Positive: Headache, visual changes Negative: fever  Physical Exam  There were no vitals taken for this visit. Gen:   Awake, no distress   Resp:  Normal effort  MSK:   Moves extremities without difficulty  Other:    Medical Decision Making  Medically screening exam initiated at 10:21 PM.  Appropriate orders placed.  Janice [name] was informed that the remainder of the evaluation will be completed by another provider, this initial triage assessment does not replace that evaluation, and the importance of remaining in the ED until their evaluation is complete.  Patient will most likely need migraine cocktail when brought to room. Patient has had thorough work-up over the past week including CT head and MRI with no acute abnormalities.    [name], [place] 04/11/21 2224

## 2021-04-14 ENCOUNTER — Encounter (HOSPITAL_COMMUNITY): Payer: Self-pay | Admitting: Emergency Medicine

## 2021-04-14 ENCOUNTER — Emergency Department (HOSPITAL_COMMUNITY)
Admission: EM | Admit: 2021-04-14 | Discharge: 2021-04-15 | Disposition: A | Discharge status: Home / Self Care | Payer: 59 | Attending: Emergency Medicine | Admitting: Emergency Medicine

## 2021-04-14 ENCOUNTER — Other Ambulatory Visit: Payer: Self-pay

## 2021-04-14 DIAGNOSIS — H538 Other visual disturbances: Secondary | ICD-10-CM | POA: Insufficient documentation

## 2021-04-14 DIAGNOSIS — I10 Essential (primary) hypertension: Secondary | ICD-10-CM | POA: Insufficient documentation

## 2021-04-14 DIAGNOSIS — R42 Dizziness and giddiness: Secondary | ICD-10-CM | POA: Insufficient documentation

## 2021-04-14 DIAGNOSIS — F17210 Nicotine dependence, cigarettes, uncomplicated: Secondary | ICD-10-CM | POA: Diagnosis not present

## 2021-04-14 DIAGNOSIS — R519 Headache, unspecified: Secondary | ICD-10-CM | POA: Diagnosis not present

## 2021-04-14 DIAGNOSIS — Z79899 Other long term (current) drug therapy: Secondary | ICD-10-CM | POA: Diagnosis not present

## 2021-04-14 DIAGNOSIS — Z7982 Long term (current) use of aspirin: Secondary | ICD-10-CM | POA: Insufficient documentation

## 2021-04-14 LAB — CBC WITH DIFFERENTIAL/PLATELET
Abs Immature Granulocytes: 0.04 10*3/uL (ref 0.00–0.07)
Basophils Absolute: 0 10*3/uL (ref 0.0–0.1)
Basophils Relative: 0 %
Eosinophils Absolute: 0.1 10*3/uL (ref 0.0–0.5)
Eosinophils Relative: 1 %
HCT: 45 % (ref 36.0–46.0)
Hemoglobin: 14 g/dL (ref 12.0–15.0)
Immature Granulocytes: 0 %
Lymphocytes Relative: 25 %
Lymphs Abs: 2.7 10*3/uL (ref 0.7–4.0)
MCH: 23.8 pg — ABNORMAL LOW (ref 26.0–34.0)
MCHC: 31.1 g/dL (ref 30.0–36.0)
MCV: 76.4 fL — ABNORMAL LOW (ref 80.0–100.0)
Monocytes Absolute: 0.5 10*3/uL (ref 0.1–1.0)
Monocytes Relative: 5 %
Neutro Abs: 7.5 10*3/uL (ref 1.7–7.7)
Neutrophils Relative %: 69 %
Platelets: 335 10*3/uL (ref 150–400)
RBC: 5.89 MIL/uL — ABNORMAL HIGH (ref 3.87–5.11)
RDW: 13.9 % (ref 11.5–15.5)
WBC: 10.9 10*3/uL — ABNORMAL HIGH (ref 4.0–10.5)
nRBC: 0 % (ref 0.0–0.2)

## 2021-04-14 LAB — URINALYSIS, ROUTINE W REFLEX MICROSCOPIC
Bilirubin Urine: NEGATIVE
Glucose, UA: NEGATIVE mg/dL
Ketones, ur: NEGATIVE mg/dL
Nitrite: NEGATIVE
Protein, ur: NEGATIVE mg/dL
Specific Gravity, Urine: 1.02 (ref 1.005–1.030)
pH: 5 (ref 5.0–8.0)

## 2021-04-14 NOTE — ED Provider Notes (Signed)
Emergency Medicine Provider Triage Evaluation Note  Janice [name] , a 55 y.o. female  was evaluated in triage.  Pt complains of frontal headache with associated blurred vision.  Has been seen for same a few times recently, had negative CT head and MRI/MRV.  States headache is not as bad as it was but vision is still blurry in both eyes.  Reports taking "a whole bottle of tylenol and Aspirin over the past few days".  No neurology follow-up.  Review of Systems  Positive: Headache, blurred vision Negative: vomiting  Physical Exam  BP (!) 184/121   Pulse 86   Temp 98.5 F (36.9 C) (Oral)   Resp 16   SpO2 93%  Gen:   Awake, no distress   Resp:  Normal effort  MSK:   Moves extremities without difficulty  Other:    Medical Decision Making  Medically screening exam initiated at 10:23 PM.  Appropriate orders placed.  Janice [name] was informed that the remainder of the evaluation will be completed by another provider, this initial triage assessment does not replace that evaluation, and the importance of remaining in the ED until their evaluation is complete.  Headache with blurred vision.  Negative CT and MRI/MRV 04/05/21.  Her BP is elevated here at 184/121, I question if this may be related.  Will check labs, EKG.    10:26 PM Informed that patient refusing labs and further work-up from triage.  States "I came here to see the neurologist".   Garlon Hatchet, PA-C 04/14/21 2228    Alvira Monday, MD 04/15/21 [phone]

## 2021-04-14 NOTE — ED Triage Notes (Signed)
Pt here for worsening HA and blurry vision since 10/29. Pt states she has been really unsteady on her feet. Pt reports HA at the front, tylenol hasn't helped. Pt unable to follow up w/ neurologist.

## 2021-04-14 NOTE — ED Notes (Signed)
Pt states she finished a bottle of tylenol over a 5 day period due to HA

## 2021-04-15 ENCOUNTER — Encounter: Payer: Self-pay | Admitting: Neurology

## 2021-04-15 LAB — COMPREHENSIVE METABOLIC PANEL
ALT: 27 U/L (ref 0–44)
AST: 25 U/L (ref 15–41)
Albumin: 3.3 g/dL — ABNORMAL LOW (ref 3.5–5.0)
Alkaline Phosphatase: 98 U/L (ref 38–126)
Anion gap: 9 (ref 5–15)
BUN: 22 mg/dL — ABNORMAL HIGH (ref 6–20)
CO2: 31 mmol/L (ref 22–32)
Calcium: 8.9 mg/dL (ref 8.9–10.3)
Chloride: 94 mmol/L — ABNORMAL LOW (ref 98–111)
Creatinine, Ser: 1.11 mg/dL — ABNORMAL HIGH (ref 0.44–1.00)
GFR, Estimated: 58 mL/min — ABNORMAL LOW (ref >60)
Glucose, Bld: 100 mg/dL — ABNORMAL HIGH (ref 70–99)
Potassium: 2.6 mmol/L — CL (ref 3.5–5.1)
Sodium: 134 mmol/L — ABNORMAL LOW (ref 135–145)
Total Bilirubin: 0.8 mg/dL (ref 0.3–1.2)
Total Protein: 7.8 g/dL (ref 6.5–8.1)

## 2021-04-15 LAB — SALICYLATE LEVEL: Salicylate Lvl: <7 mg/dL — ABNORMAL LOW (ref 7.0–30.0)

## 2021-04-15 LAB — ACETAMINOPHEN LEVEL: Acetaminophen (Tylenol), Serum: <10 ug/mL — ABNORMAL LOW (ref 10–30)

## 2021-04-15 MED ORDER — POTASSIUM CHLORIDE CRYS ER 20 MEQ PO TBCR
40.0000 meq | EXTENDED_RELEASE_TABLET | Freq: Once | ORAL | Status: AC
  Administered 2021-04-15: 40 meq via ORAL
  Filled 2021-04-15: qty 2

## 2021-04-15 MED ORDER — DIPHENHYDRAMINE HCL 50 MG/ML IJ SOLN
25.0000 mg | Freq: Once | INTRAMUSCULAR | Status: AC
  Administered 2021-04-15: 25 mg via INTRAVENOUS
  Filled 2021-04-15: qty 1

## 2021-04-15 MED ORDER — KETOROLAC TROMETHAMINE 15 MG/ML IJ SOLN
15.0000 mg | Freq: Once | INTRAMUSCULAR | Status: AC
  Administered 2021-04-15: 15 mg via INTRAVENOUS
  Filled 2021-04-15: qty 1

## 2021-04-15 MED ORDER — PROCHLORPERAZINE EDISYLATE 10 MG/2ML IJ SOLN
10.0000 mg | Freq: Once | INTRAMUSCULAR | Status: AC
  Administered 2021-04-15: 10 mg via INTRAVENOUS
  Filled 2021-04-15: qty 2

## 2021-04-15 MED ORDER — AMLODIPINE BESYLATE 5 MG PO TABS
5.0000 mg | ORAL_TABLET | Freq: Once | ORAL | Status: DC
Start: 2021-04-15 — End: 2021-04-15

## 2021-04-15 NOTE — ED Provider Notes (Signed)
[facility] EMERGENCY DEPARTMENT Provider Note  CSN: [id number] Arrival date & time: 04/14/21 1913  Chief Complaint(s) Headache and Blurred Vision  HPI Janice [name] is a 55 y.o. female with a past medical history listed below who presents to the emergency department with recurring frontal headaches for the past 2 weeks.  Pain is aching.  Worse with lights.  She is endorsing bilateral blurry vision.  Also endorsing lightheadedness and feeling off balance.  No nausea or vomiting.  No fevers or chills.  No coughing or congestion.  No chest pain or shortness of breath.  No focal deficits.    Patient reports that she has been seen 2 or 3 times now for the same and has been treated with migraine cocktails which do relieve the headaches for several days but they tend to return.  During the initial visit patient was worked up and had negative CT, MRIs and MRV's.  Both times they noted patient's had hypokalemia.  She has not picked up her potassium supplements.  Patient was referred to neurology but has not made an a follow-up appointment.   Headache  Past Medical History Past Medical History:  Diagnosis Date   Depression    history of   GERD (gastroesophageal reflux disease)    acid reflux at night and with spicy foods   Hypertension    Morbid obesity with BMI of 40.0-44.9, adult [facility])    Patient Active Problem List   Diagnosis Date Noted   Pap smear abnormality of cervix/human papillomavirus (HPV) positive 11/06/2019   Trichomonas infection 11/06/2019   Prediabetes 04/04/2019   Perimenopausal 03/06/2019   Psoriasis 03/06/2019   Vitamin D deficiency 09/29/2015   CHEST PAIN 10/04/2007   OBESITY, NOS 08/02/2006   TOBACCO DEPENDENCE 08/02/2006   Depressive disorder, not elsewhere classified 08/02/2006   HYPERTENSION, BENIGN SYSTEMIC 08/02/2006   Esophageal reflux 08/02/2006   Home Medication(s) Prior to Admission medications   Medication Sig Start Date End Date  Taking? Authorizing Provider  amLODipine (NORVASC) 5 MG tablet Take 1 tablet (5 mg total) by mouth at bedtime. 04/05/21  Yes Horton, Mayer Masker, MD  aspirin EC 81 MG tablet Take 81 mg by mouth every 4 (four) hours as needed for moderate pain. Swallow whole.   Yes [provider]  indapamide (LOZOL) 2.5 MG tablet Take 2 tablets (5 mg total) by mouth daily. 04/05/21  Yes Horton, Mayer Masker, MD  naproxen sodium (ALEVE) 220 MG tablet Take 220 mg by mouth daily as needed (pain/headache).   Yes [provider]  tiZANidine (ZANAFLEX) 2 MG tablet Take 1 tablet (2 mg total) by mouth every 8 (eight) hours as needed for muscle spasms. 03/07/21  Yes Rhys Martini, PA-C  ibuprofen (ADVIL) 600 MG tablet Take 1 tablet (600 mg total) by mouth every 6 (six) hours as needed. Patient not taking: No sig reported 03/14/21   Merrilee Jansky, MD  naproxen (NAPROSYN) 500 MG tablet Take 1 tablet (500 mg total) by mouth 2 (two) times daily with a meal. Patient not taking: No sig reported 01/25/21   Meccariello, Solmon Ice, DO  potassium chloride (KLOR-CON) 20 MEQ packet Take 40 mEq by mouth daily. Patient not taking: No sig reported 04/05/21   Horton, Mayer Masker, MD  tazarotene (TAZORAC) 0.05 % gel Apply topically at bedtime. Patient not taking: No sig reported 10/17/19   Shirley, [place], DO  Past Surgical History Past Surgical History:  Procedure Laterality Date   CESAREAN SECTION     x2   Family History Family History  Problem Relation Age of Onset   Hypertension Mother    Kidney disease Mother    Alcohol abuse Father    Depression Father    Drug abuse Father    Hypertension Father    Hypertension Maternal Grandmother    Stroke Maternal Grandmother    Alzheimer's disease Maternal Grandfather    Hypertension Daughter     Social History Social History   Tobacco  Use   Smoking status: Every Day    Packs/day: 0.25    Years: 40.00    Pack years: 10.00    Types: Cigarettes   Smokeless tobacco: Never   Tobacco comments:    Wants to quit in 2021  Vaping Use   Vaping Use: Never used  Substance Use Topics   Alcohol use: Yes    Comment: occ, 2 drinks   Drug use: No   Allergies Losartan and Neomycin sulfate  Review of Systems Review of Systems  Neurological:  Positive for headaches.  All other systems are reviewed and are negative for acute change except as noted in the HPI  Physical Exam Vital Signs  I have reviewed the triage vital signs BP (!) 159/127 (BP Location: Right Arm)   Pulse 82   Temp 98.1 F (36.7 C) (Oral)   Resp 17   Ht 5' (1.524 m)   Wt 98 kg   SpO2 96%   BMI 42.18 kg/m   Physical Exam Vitals reviewed.  Constitutional:      General: She is not in acute distress.    Appearance: She is well-developed. She is not diaphoretic.  HENT:     Head: Normocephalic and atraumatic.     Right Ear: External ear normal.     Left Ear: External ear normal.     Nose: Nose normal.  Eyes:     General: No scleral icterus.    Conjunctiva/sclera: Conjunctivae normal.     Funduscopic exam:    Right eye: No papilledema.        Left eye: No papilledema.  Neck:     Trachea: Phonation normal.  Cardiovascular:     Rate and Rhythm: Normal rate and regular rhythm.  Pulmonary:     Effort: Pulmonary effort is normal. No respiratory distress.     Breath sounds: No stridor.  Abdominal:     General: There is no distension.  Musculoskeletal:        General: Normal range of motion.     Cervical back: Normal range of motion.  Neurological:     Mental Status: She is alert and oriented to person, place, and time.     Comments: Mental Status:  Alert and oriented to person, place, and time.  Attention and concentration normal.  Speech clear.  Recent memory is intact  Cranial Nerves:  II Visual Fields: Intact to confrontation. Visual  fields intact. III, IV, VI: Pupils equal and reactive to light and near. Full eye movement without nystagmus  V Facial Sensation: Normal. No weakness of masticatory muscles  VII: No facial weakness or asymmetry  VIII Auditory Acuity: Grossly normal  IX/X: The uvula is midline; the palate elevates symmetrically  XI: Normal sternocleidomastoid and trapezius strength  XII: The tongue is midline. No atrophy or fasciculations.  Motor System: Muscle Strength: 5/5 and symmetric in the upper and lower extremities. No pronation or drift.  Muscle Tone: Tone and muscle bulk are normal in the upper and lower extremities.  Reflexes: DTRs: 1+ and symmetrical in all four extremities. No Clonus Coordination: Intact finger-to-nose, No tremor.  Sensation: Intact to light touch. Gait: Routine gait stable   Psychiatric:        Behavior: Behavior normal.    ED Results and Treatments Labs (all labs ordered are listed, but only abnormal results are displayed) Labs Reviewed  CBC WITH DIFFERENTIAL/PLATELET - Abnormal; Notable for the following components:      Result Value   WBC 10.9 (*)    RBC 5.89 (*)    MCV 76.4 (*)    MCH 23.8 (*)    All other components within normal limits  COMPREHENSIVE METABOLIC PANEL - Abnormal; Notable for the following components:   Sodium 134 (*)    Potassium 2.6 (*)    Chloride 94 (*)    Glucose, Bld 100 (*)    BUN 22 (*)    Creatinine, Ser 1.11 (*)    Albumin 3.3 (*)    GFR, Estimated 58 (*)    All other components within normal limits  ACETAMINOPHEN LEVEL - Abnormal; Notable for the following components:   Acetaminophen (Tylenol), Serum <10 (*)    All other components within normal limits  SALICYLATE LEVEL - Abnormal; Notable for the following components:   Salicylate Lvl <7.0 (*)    All other components within normal limits  URINALYSIS, ROUTINE W REFLEX MICROSCOPIC - Abnormal; Notable for the following components:   APPearance HAZY (*)    Hgb urine dipstick  SMALL (*)    Leukocytes,Ua MODERATE (*)    Bacteria, UA RARE (*)    All other components within normal limits                                                                                                                         EKG  EKG Interpretation  Date/Time:  Friday April 15 2021 01:20:11 EST Ventricular Rate:  84 PR Interval:  176 QRS Duration: 91 QT Interval:  452 QTC Calculation: 535 R Axis:   32 Text Interpretation: Sinus rhythm Consider right atrial enlargement Low voltage, precordial leads Borderline T wave abnormalities Prolonged QT interval No significant change was found Confirmed by Drema Pry ([id number]) on 04/15/2021 1:27:18 AM       Radiology No results found.  Pertinent labs & imaging results that were available during my care of the patient were reviewed by me and considered in my medical decision making (see MDM for details).  Medications Ordered in ED Medications  amLODipine (NORVASC) tablet 5 mg (5 mg Oral Not Given 04/15/21 0410)  diphenhydrAMINE (BENADRYL) injection 25 mg (25 mg Intravenous Given 04/15/21 0307)  prochlorperazine (COMPAZINE) injection 10 mg (10 mg Intravenous Given 04/15/21 0310)  ketorolac (TORADOL) 15 MG/ML injection 15 mg (15 mg Intravenous Given 04/15/21 0305)  potassium chloride SA (KLOR-CON) CR tablet 40 mEq (40 mEq Oral Given 04/15/21 0302)  Procedures Procedures  (including critical care time)  Medical Decision Making / ED Course I have reviewed the nursing notes for this encounter and the patient's prior records (if available in EHR or on provided paperwork).  Janice [name] was evaluated in Emergency Department on 04/15/2021 for the symptoms described in the history of present illness. She was evaluated in the context of the global COVID-19 pandemic, which necessitated consideration that  the patient might be at risk for infection with the SARS-CoV-2 virus that causes COVID-19. Institutional protocols and algorithms that pertain to the evaluation of patients at risk for COVID-19 are in a state of rapid change based on information released by regulatory bodies including the CDC and federal and state organizations. These policies and algorithms were followed during the patient's care in the ED.     Recurrent migraine headache for the pt. Non focal neuro exam. No recent head trauma. No fever. Doubt meningitis. Doubt intracranial bleed. Doubt IIH. No indication for imaging.   Will treat with migraine cocktail and reevaluate.  Pertinent labs & imaging results that were available during my care of the patient were reviewed by me and considered in my medical decision making:  Completely resolved headache.   Final Clinical Impression(s) / ED Diagnoses Final diagnoses:  Bad headache   The patient appears reasonably screened and/or stabilized for discharge and I doubt any other medical condition or other [facility] requiring further screening, evaluation, or treatment in the ED at this time prior to discharge. Safe for discharge with strict return precautions.  Disposition: Discharge  Condition: Good  I have discussed the results, Dx and Tx plan with the patient/family who expressed understanding and agree(s) with the plan. Discharge instructions discussed at length. The patient/family was given strict return precautions who verbalized understanding of the instructions. No further questions at time of discharge.    ED Discharge Orders     None        Follow Up: Neurology  Call  to schedule an appointment for close follow up     This chart was dictated using voice recognition software.  Despite best efforts to proofread,  errors can occur which can change the documentation meaning.    Nira Conn, MD 04/15/21 [phone]

## 2021-05-31 ENCOUNTER — Other Ambulatory Visit: Payer: Self-pay

## 2021-05-31 ENCOUNTER — Ambulatory Visit: Payer: 59 | Attending: Internal Medicine | Admitting: Internal Medicine

## 2021-05-31 VITALS — BP 166/94 | HR 80 | Ht 60.0 in | Wt 227.0 lb

## 2021-05-31 DIAGNOSIS — L309 Dermatitis, unspecified: Secondary | ICD-10-CM

## 2021-05-31 DIAGNOSIS — F17210 Nicotine dependence, cigarettes, uncomplicated: Secondary | ICD-10-CM | POA: Diagnosis not present

## 2021-05-31 DIAGNOSIS — E876 Hypokalemia: Secondary | ICD-10-CM

## 2021-05-31 DIAGNOSIS — H538 Other visual disturbances: Secondary | ICD-10-CM

## 2021-05-31 DIAGNOSIS — Z7689 Persons encountering health services in other specified circumstances: Secondary | ICD-10-CM

## 2021-05-31 DIAGNOSIS — F322 Major depressive disorder, single episode, severe without psychotic features: Secondary | ICD-10-CM | POA: Diagnosis not present

## 2021-05-31 DIAGNOSIS — Z2821 Immunization not carried out because of patient refusal: Secondary | ICD-10-CM

## 2021-05-31 DIAGNOSIS — F329 Major depressive disorder, single episode, unspecified: Secondary | ICD-10-CM

## 2021-05-31 DIAGNOSIS — I10 Essential (primary) hypertension: Secondary | ICD-10-CM

## 2021-05-31 DIAGNOSIS — G43109 Migraine with aura, not intractable, without status migrainosus: Secondary | ICD-10-CM

## 2021-05-31 DIAGNOSIS — F17200 Nicotine dependence, unspecified, uncomplicated: Secondary | ICD-10-CM

## 2021-05-31 DIAGNOSIS — S61309A Unspecified open wound of unspecified finger with damage to nail, initial encounter: Secondary | ICD-10-CM

## 2021-05-31 HISTORY — DX: Major depressive disorder, single episode, unspecified: F32.9

## 2021-05-31 HISTORY — DX: Migraine with aura, not intractable, without status migrainosus: G43.109

## 2021-05-31 MED ORDER — TRIAMCINOLONE ACETONIDE 0.1 % EX CREA
1.0000 | TOPICAL_CREAM | Freq: Two times a day (BID) | CUTANEOUS | 2 refills | Status: DC
Start: 2021-05-31 — End: 2022-08-18
  Filled 2021-05-31 – 2021-06-15 (×2): qty 30, 15d supply, fill #0

## 2021-05-31 MED ORDER — AMLODIPINE BESYLATE 10 MG PO TABS
10.0000 mg | ORAL_TABLET | Freq: Every day | ORAL | 4 refills | Status: DC
  Filled 2021-05-31 – 2021-06-15 (×2): qty 30, 30d supply, fill #0

## 2021-05-31 MED ORDER — SPIRONOLACTONE 25 MG PO TABS
12.5000 mg | ORAL_TABLET | Freq: Every day | ORAL | 2 refills | Status: DC
  Filled 2021-05-31 – 2021-06-15 (×2): qty 15, 30d supply, fill #0

## 2021-05-31 MED ORDER — TOPIRAMATE 25 MG PO TABS
25.0000 mg | ORAL_TABLET | Freq: Every day | ORAL | 1 refills | Status: DC
  Filled 2021-05-31 – 2021-06-15 (×2): qty 30, 30d supply, fill #0

## 2021-05-31 MED ORDER — BUPROPION HCL ER (SR) 150 MG PO TB12
ORAL_TABLET | ORAL | 1 refills | Status: DC
  Filled 2021-05-31 – 2021-06-15 (×2): qty 60, 30d supply, fill #0
  Filled 2021-10-28 – 2021-11-04 (×2): qty 60, 30d supply, fill #1

## 2021-05-31 MED ORDER — BUTALBITAL-APAP-CAFFEINE 50-325-40 MG PO TABS
1.0000 | ORAL_TABLET | Freq: Two times a day (BID) | ORAL | 1 refills | Status: DC | PRN
  Filled 2021-05-31: qty 20, 5d supply, fill #0

## 2021-05-31 MED ORDER — AMLODIPINE BESYLATE 10 MG PO TABS: 10.0000 mg | ORAL_TABLET | Freq: Every day | ORAL | 4 refills | Status: DC

## 2021-05-31 NOTE — Patient Instructions (Signed)
Your blood pressure is not at goal.  Stop the indapamide. Increase amlodipine to 10 mg daily for blood pressure.  We have added a new blood pressure medication called spironolactone 12.5 mg daily. Follow-up with our clinical pharmacist in 1 week for repeat blood pressure check.

## 2021-05-31 NOTE — Progress Notes (Signed)
Patient ID: Janice [name], female    DOB: [birth year]  MRN: [id number]  CC: New Patient (Initial Visit)   Subjective: Janice [name] is a 56 y.o. female who presents for new pt visit and f/u from ER visits Her concerns today include:  Pt with hx of HTN, tob dep, Vit D def, preDM, depression, +HPV on pap, GERD, chronic hypokalemia  She was last seen here in 2018 when she was under the care of one of our NP's.  Then she was followed by [facility].  Last seen 10/2019.  Loss insurance and could not go back.  Hx of HTN.  Suppose to be on Indapamide 10 mg daily,Norvasc and Potassium supplement.  She has been off both BP meds for several days because she misplaced her bottles.  Her grandkids were at the house and she put her medicine bottles away but does not recall where she placed them.  Off K+ supplement for a while due to cost -Has been trying to limit salt in her food more recently.  .   No LE edema lately but reports this has been a problem which was why she was placed on the indapamide.   Endorses HA but not as strong and less often as last mth where she had to go to ER several times.   Previously, HA were occurring every day all day.  Now once a day but not as severe.  HA frontal, +photophobia/phenophobia, +blurred vision (even when not having HA.  Does not drive at nights because of this.  Reports blurred vision for yrs.  Years since she had formal eye exam.  Use to wear rxn glasses in past.).  No N/V.  Headaches come on gradually and vision would get blurry.   Takes Ibuprofen 1-2 times a day.  Takes one 600 mg.  Takes a few hrs to kick in.  No prior HA like this.   Since HA she feels her memory has declined. Has appt appt with neurologist 07/2021 CT of the head done 04/04/2021 showed changes of small vessel disease.  MR venogram with and without contrast on 04/05/2021 showed no dural venous thrombus  Reports feeling depress since HA started. Loss her job over having to go to  ER frequently.  Was working at a [employer] in [place].  Also loss her boyfriend of 40 yrs 04/02/2020.  She feels this triggered her depression.  Would like to get on medication for this.  She was doing some grief counseling through her church but did not find it helpful.  Not sleeping well.  Never on med for depression.  Saw a counselor when she was 34 or 55 yrs old when she had depression with SI.  No SI at this time.  However, when her boyfriend died, she stopped taking her meds and "abandon my health."    Tob dep:  smoking more since increase depression.  At 1 pk a day from 1/3 pk a day.  Wants to quit and reports she quit in 1990s for 6 mths.  Smoked since age 44  Patient Active Problem List   Diagnosis Date Noted   Pap smear abnormality of cervix/human papillomavirus (HPV) positive 11/06/2019   Trichomonas infection 11/06/2019   Prediabetes 04/04/2019   Perimenopausal 03/06/2019   Psoriasis 03/06/2019   Vitamin D deficiency 09/29/2015   CHEST PAIN 10/04/2007   OBESITY, NOS 08/02/2006   TOBACCO DEPENDENCE 08/02/2006   Depressive disorder, not elsewhere classified 08/02/2006   HYPERTENSION,  BENIGN SYSTEMIC 08/02/2006   Esophageal reflux 08/02/2006     Current Outpatient Medications on File Prior to Visit  Medication Sig Dispense Refill   amLODipine (NORVASC) 5 MG tablet Take 1 tablet (5 mg total) by mouth at bedtime. 90 tablet 3   aspirin EC 81 MG tablet Take 81 mg by mouth every 4 (four) hours as needed for moderate pain. Swallow whole.     ibuprofen (ADVIL) 600 MG tablet Take 1 tablet (600 mg total) by mouth every 6 (six) hours as needed. 30 tablet 0   indapamide (LOZOL) 2.5 MG tablet Take 2 tablets (5 mg total) by mouth daily. 180 tablet 0   naproxen (NAPROSYN) 500 MG tablet Take 1 tablet (500 mg total) by mouth 2 (two) times daily with a meal. 60 tablet 1   naproxen sodium (ALEVE) 220 MG tablet Take 220 mg by mouth daily as needed (pain/headache).     potassium chloride  (KLOR-CON) 20 MEQ packet Take 40 mEq by mouth daily. 30 each 1   tazarotene (TAZORAC) 0.05 % gel Apply topically at bedtime. 30 g 0   tiZANidine (ZANAFLEX) 2 MG tablet Take 1 tablet (2 mg total) by mouth every 8 (eight) hours as needed for muscle spasms. 21 tablet 0   No current facility-administered medications on file prior to visit.    Allergies  Allergen Reactions   Losartan     Chest pain   Neomycin Sulfate Swelling, Rash and Other (See Comments)    Pain in ear, swelling and redness    Social History   Socioeconomic History   Marital status: Single    Spouse name: Not on file   Number of children: 3   Years of education: Not on file   Highest education level: Not on file  Occupational History   Not on file  Tobacco Use   Smoking status: Every Day    Packs/day: 0.25    Years: 40.00    Pack years: 10.00    Types: Cigarettes   Smokeless tobacco: Never   Tobacco comments:    Wants to quit in 2021  Vaping Use   Vaping Use: Never used  Substance and Sexual Activity   Alcohol use: Yes    Comment: occ, 2 drinks   Drug use: No   Sexual activity: Yes    Birth control/protection: Condom  Other Topics Concern   Not on file  Social History Narrative   Not on file   Social Determinants of Health   Financial Resource Strain: Not on file  Food Insecurity: Not on file  Transportation Needs: Not on file  Physical Activity: Not on file  Stress: Not on file  Social Connections: Not on file  Intimate Partner Violence: Not on file    Family History  Problem Relation Age of Onset   Hypertension Mother    Kidney disease Mother    Alcohol abuse Father    Depression Father    Drug abuse Father    Hypertension Father    Hypertension Maternal Grandmother    Stroke Maternal Grandmother    Alzheimer's disease Maternal Grandfather    Hypertension Daughter     Past Surgical History:  Procedure Laterality Date   CESAREAN SECTION     x2    ROS: Review of Systems   Skin:        Reports having psoriasis for about 5 years.  Used to have it on her elbows and the right leg.  Now just on the right  leg.  Started as a small spot on the right leg but over the years it has expanded.  It is very itchy.  She has never seen a dermatologist.  Reports being given a cream by a provider here at 1 time which helped.  Also complains of having a hematoma under the nailbed of the right thumb.  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  After the hematoma resolved, the nailbed was pushed up leaving part of the nail detached from the skin.  She has been trying to pull it off but it is still attached to the nailbed on the medial aspect.   PHYSICAL EXAM: BP (!) 166/94    Pulse 80    Ht 5' (1.524 m)    Wt 227 lb (103 kg)    SpO2 95%    BMI 44.33 kg/m   Physical Exam Repeat blood pressure 170/122 General appearance - alert, well appearing, obese middle-age African-American female and in no distress Mental status - normal mood, behavior, speech, dress, motor activity, and thought processes Eyes - pupils equal and reactive, extraocular eye movements intact Nose - normal and patent, no erythema, discharge or polyps Mouth - mucous membranes moist, pharynx normal without lesions Neck - supple, no significant adenopathy Chest - clear to auscultation, no wheezes, rales or rhonchi, symmetric air entry Heart - normal rate, regular rhythm, normal S1, S2, no murmurs, rubs, clicks or gallops Extremities - peripheral pulses normal, no pedal edema, no clubbing or cyanosis Skin -right thumb: More than half of the nail has been avulsed from the nail bed. Hyperpigmented rough skin on the lateral aspect of the right leg.  See photo below.   Depression screen [facility] 2/9 05/31/2021 10/30/2019 10/15/2019  Decreased Interest 3 0 0  Down, Depressed, Hopeless 3 0 0  PHQ - 2 Score 6 0 0  Altered sleeping 3 - -  Tired, decreased energy 3 - -  Change in appetite 3 - -  Feeling  bad or failure about yourself  3 - -  Trouble concentrating 3 - -  Moving slowly or fidgety/restless 3 - -  Suicidal thoughts 3 - -  PHQ-9 Score 27 - -    CMP Latest Ref Rng & Units 04/14/2021 04/08/2021 04/04/2021  Glucose 70 - 99 mg/dL 161(W) 960(A) 540(J)  BUN 6 - 20 mg/dL 81(X) 91(Y) 20  Creatinine 0.44 - 1.00 mg/dL 7.82(N) 5.62(Z) 3.08(M)  Sodium 135 - 145 mmol/L 134(L) 136 137  Potassium 3.5 - 5.1 mmol/L 2.6(LL) 2.7(LL) 2.9(L)  Chloride 98 - 111 mmol/L 94(L) 96(L) 96(L)  CO2 22 - 32 mmol/L Calcium 8.9 - 10.3 mg/dL 8.9 9.0 9.3  Total Protein 6.5 - 8.1 g/dL 7.8 - 8.0  Total Bilirubin 0.3 - 1.2 mg/dL 0.8 - 0.6  Alkaline Phos 38 - 126 U/L 98 - 108  AST 15 - 41 U/L 25 - 19  ALT 0 - 44 U/L 27 - 15   Lipid Panel     Component Value Date/Time   CHOL 152 03/06/2019 1344   TRIG 75 03/06/2019 1344   HDL 44 03/06/2019 1344   CHOLHDL 3.5 03/06/2019 1344   CHOLHDL 3.7 08/24/2016 1634   VLDL 22 08/24/2016 1634   LDLCALC 93 03/06/2019 1344    CBC    Component Value Date/Time   WBC 10.9 (H) 04/14/2021 2235   RBC 5.89 (H) 04/14/2021 2235   HGB 14.0 04/14/2021 2235   HGB 13.5 03/06/2019 1344  HCT 45.0 04/14/2021 2235   HCT 44.3 03/06/2019 1344   PLT 335 04/14/2021 2235   PLT 308 03/06/2019 1344   MCV 76.4 (L) 04/14/2021 2235   MCV 77 (L) 03/06/2019 1344   MCH 23.8 (L) 04/14/2021 2235   MCHC 31.1 04/14/2021 2235   RDW 13.9 04/14/2021 2235   RDW 14.4 03/06/2019 1344   LYMPHSABS 2.7 04/14/2021 2235   LYMPHSABS 2.2 03/06/2019 1344   MONOABS 0.5 04/14/2021 2235   EOSABS 0.1 04/14/2021 2235   EOSABS 0.2 03/06/2019 1344   BASOSABS 0.0 04/14/2021 2235   BASOSABS 0.1 03/06/2019 1344    ASSESSMENT AND PLAN: 1. Encounter to establish care   2. Essential hypertension Patient has been off her medications for several days having misplaced her bottles. Stop indapamide given the hypokalemia.  We will place her on spironolactone instead and increase the dose of  amlodipine to 10 mg.  Follow-up with clinical pharmacist in 1 week for repeat blood pressure check. Check BMP today.  If potassium level is still low, we will need to check a renin and aldosterone ratio to rule out hyperaldosteronism - spironolactone (ALDACTONE) 25 MG tablet; Take 0.5 tablets (12.5 mg total) by mouth daily.  Dispense: 30 tablet; Refill: 2 - Basic Metabolic Panel - Lipid panel - amLODipine (NORVASC) 10 MG tablet; Take 1 tablet (10 mg total) by mouth at bedtime.  Dispense: 30 tablet; Refill: 4  3. Migraine with aura and without status migrainosus, not intractable -She will keep her upcoming appointment in February with Dr. Everlena Cooper. Discussed the importance of healthy eating, adequate sleep and helping to control migraines.  Her blood pressure is too high for me to put her on Imitrex, so we will use Fioricet instead.  Stop ibuprofen and Naprosyn.  Advised that Fioricet can be habit-forming so we will give a limited amount per month and advised to use only when needed.  We will also start her on low-dose Topamax at bedtime to help decrease the frequency of headaches. - Ambulatory referral to Neurology - topiramate (TOPAMAX) 25 MG tablet; Take 1 tablet (25 mg total) by mouth at bedtime. To decrease frequency of headaches  Dispense: 30 tablet; Refill: 1 - butalbital-acetaminophen-caffeine (FIORICET) 50-325-40 MG tablet; Take 1-2 tablets by mouth 2 (two) times daily as needed for headache.  Dispense: 20 tablet; Refill: 1  4. Severe major depression without psychotic features [facility]) Patient agreeable to referral to behavioral health.  She would like to be tried on medication for depression.  She denies suicidal ideation at this time.  I told her Wellbutrin would probably be a good start and that it would also help with smoking cessation.  Advised that if she experience increased depression with this medicine she should stop it and give [place] a call. - Ambulatory referral to Psychiatry - buPROPion  (WELLBUTRIN SR) 150 MG 12 hr tablet; Take 1 tablet by mouth daily for 1 week, then increase to 1 tablet twice daily.  Dispense: 60 tablet; Refill: 1  5. Tobacco dependence Pt is current smoker. Patient advised to quit smoking. Discussed health risks associated with smoking including lung and other types of cancers, chronic lung diseases and CV risks.. Pt ready to give trail of quitting.   Discussed methods to help quit including quitting cold [place], use of NRT, Chantix and Bupropion.  Pt wanting to try: Wellbutrin _3_ Minutes spent on counseling. F/U: Reassess progress on subsequent visit.   6. Dermatitis - triamcinolone cream (KENALOG) 0.1 %; Apply 1 application topically 2 (  two) times daily.  Dispense: 30 g; Refill: 2 - Ambulatory referral to Dermatology  7. Avulsion of fingernail, initial encounter Advised patient to try to clip a part of the nail that has been avulsed to see if it would grow back and evenly with the rest of the nail that is still attached to the skin.  8. Hypokalemia See #1 above.  9. Blurred vision - Ambulatory referral to Ophthalmology  10. Influenza vaccination declined Recommended.  Patient declined.    Patient was given the opportunity to ask questions.  Patient verbalized understanding of the plan and was able to repeat key elements of the plan.   No orders of the defined types were placed in this encounter.    Requested Prescriptions    No prescriptions requested or ordered in this encounter    No follow-ups on file.  Jonah Blue, MD, FACP

## 2021-06-01 ENCOUNTER — Telehealth: Payer: Self-pay

## 2021-06-01 LAB — LIPID PANEL
Chol/HDL Ratio: 3.8 ratio (ref 0.0–4.4)
Cholesterol, Total: 162 mg/dL (ref 100–199)
HDL: 43 mg/dL (ref >39)
LDL Chol Calc (NIH): 102 mg/dL — ABNORMAL HIGH (ref 0–99)
Triglycerides: 91 mg/dL (ref 0–149)
VLDL Cholesterol Cal: 17 mg/dL (ref 5–40)

## 2021-06-01 LAB — BASIC METABOLIC PANEL
BUN/Creatinine Ratio: 15 (ref 9–23)
BUN: 18 mg/dL (ref 6–24)
CO2: 31 mmol/L — ABNORMAL HIGH (ref 20–29)
Calcium: 9.1 mg/dL (ref 8.7–10.2)
Chloride: 100 mmol/L (ref 96–106)
Creatinine, Ser: 1.23 mg/dL — ABNORMAL HIGH (ref 0.57–1.00)
Glucose: 95 mg/dL (ref 70–99)
Potassium: 3.6 mmol/L (ref 3.5–5.2)
Sodium: 143 mmol/L (ref 134–144)
eGFR: 52 mL/min/{1.73_m2} — ABNORMAL LOW (ref >59)

## 2021-06-01 NOTE — Progress Notes (Signed)
Let patient know that her kidney function is not 100% but stable.  She should avoid long-term use of over-the-counter pain medications like ibuprofen, Naprosyn, Motrin, Aleve and Advil as these can make kidney function worse.  Okay to use Tylenol when needed.  Cholesterol level mildly elevated at 102 with goal being less than 100.  Healthy eating habits and regular exercise will help to lower cholesterol. The rest of this is for my information.  The 10-year ASCVD risk score (Arnett DK, et al., 2019) is: 23.5%   Values used to calculate the score:     Age: 55 years     Sex: Female     Is Non-Hispanic African American: Yes     Diabetic: No     Tobacco smoker: Yes     Systolic Blood Pressure: 166 mmHg     Is BP treated: Yes     HDL Cholesterol: 43 mg/dL     Total Cholesterol: 162 mg/dL

## 2021-06-01 NOTE — Telephone Encounter (Signed)
Will forward to provider  

## 2021-06-01 NOTE — Telephone Encounter (Signed)
Copied from CRM [id number]. Topic: Quick Communication - Rx Refill/Question >> May 31, 2021  5:10 PM Pawlus, Maxine Glenn A wrote: Pt called in asking why her "potassium" was not called into the pharmacy, please advise if this was also meant to be sent into the pharmacy.

## 2021-06-01 NOTE — Telephone Encounter (Signed)
Contacted pt to go over provider response in regards to the Potassium and lab results pt didn't answer lvm   Sent a CRM and forward labs to NT to give pt labs and to go over provider response when they call back

## 2021-06-03 ENCOUNTER — Other Ambulatory Visit: Payer: Self-pay

## 2021-06-07 ENCOUNTER — Telehealth: Payer: Self-pay

## 2021-06-07 NOTE — Telephone Encounter (Signed)
Contacted pt to go over lab results pt didn't answer lvm   Sent a CRM and forward labs to NT to give pt labs and Mychart message from 06/01/21 when they call back

## 2021-06-15 ENCOUNTER — Other Ambulatory Visit: Payer: Self-pay

## 2021-06-16 ENCOUNTER — Other Ambulatory Visit: Payer: Self-pay

## 2021-06-21 ENCOUNTER — Ambulatory Visit: Payer: 59 | Admitting: Pharmacist

## 2021-06-22 ENCOUNTER — Telehealth: Payer: Self-pay | Admitting: Clinical

## 2021-06-22 NOTE — Telephone Encounter (Signed)
I spoke with pt and scheduled appt for 07/04/21. She denies SI. She mentioned that she has just been depressed. I sent her crisis resources.

## 2021-07-04 ENCOUNTER — Institutional Professional Consult (permissible substitution): Payer: 59 | Admitting: Clinical

## 2021-07-06 ENCOUNTER — Other Ambulatory Visit: Payer: Self-pay

## 2021-07-06 ENCOUNTER — Ambulatory Visit: Payer: 59 | Attending: Family Medicine | Admitting: Clinical

## 2021-07-06 DIAGNOSIS — F322 Major depressive disorder, single episode, severe without psychotic features: Secondary | ICD-10-CM

## 2021-07-11 NOTE — BH Specialist Note (Signed)
Integrated Behavioral Health Initial In-Person Visit  MRN: [id number] Name: Janice [name]  Number of Integrated Behavioral Health Clinician visits:: 1/6 Session Start time: 4:15pm  Session End time: 5:15pm Total time: 60 minutes  Types of Service: Individual psychotherapy  Interpretor:No. Interpretor Name and Language: N/A   Warm Hand Off Completed.        Subjective: Janice [name] is a 56 y.o. female accompanied by  self Patient was referred by Jonah Blue, MD for depression. Patient reports the following symptoms/concerns: Reports feeling depressed, decreased energy, self-esteem disturbances, trouble concentrating, fidgeting, anxiousness, excessive worrying, trouble relaxing, and irritability. Reports that her children's father passed away and she is experiencing difficulty. Reports that she is often overwhelmed with helping her children who are adults. Reports that she is also experiencing physical health problems. Reports having difficulty with her memory. Reports that she "forgot how to drive" and is not sure what caused this. Reports experiencing depression since childhood.  Duration of problem: since childhood; Severity of problem: severe  Objective: Mood: Anxious and Depressed and Affect: Appropriate Risk of harm to self or others: Suicidal ideation Suicide plan Reports method as taking pills however no specific date/time.  Endorses children and religion/spirituality as protective factors. Endorses hx of suicide attempt in childhood.   Life Context: Family and Social: Pt currently lives with her son. Pt has three adult children.  School/Work: Pt is unemployed. Pt receives financial support from her son.  Self-Care: Denies substance use. Pt currently prescribed wellbutrin.  Life Changes: Pt's children's father died. Pt is also experiencing physical health changes and trouble with memory.   Patient and/or Family's Strengths/Protective Factors: Sense of  purpose  Goals Addressed: Patient will: Reduce symptoms of: anxiety and depression Increase knowledge and/or ability of: coping skills  Demonstrate ability to: Increase healthy adjustment to current life circumstances and Increase adequate support systems for patient/family  Progress towards Goals: Ongoing  Interventions: Interventions utilized: Mindfulness or Relaxation Training, CBT Cognitive Behavioral Therapy, and Supportive Counseling  Standardized Assessments completed: C-SSRS Short, GAD-7, and PHQ 9 GAD 7 : Generalized Anxiety Score 05/31/2021 08/24/2016 07/24/2016  Nervous, Anxious, on Edge 3 0 -  Control/stop worrying 3 2 3   Worry too much - different things 3 2 3   Trouble relaxing 3 2 3   Restless 2 0 0  Easily annoyed or irritable 3 1 2   Afraid - awful might happen 3 0 2  Total GAD 7 Score 20 7 -     Depression screen Janice [name] 2/9 07/06/2021 05/31/2021 10/30/2019 10/15/2019 05/09/2019  Decreased Interest 3 3 0 0 0  Down, Depressed, Hopeless 3 3 0 0 1  PHQ - 2 Score 6 6 0 0 1  Altered sleeping 3 3 - - -  Tired, decreased energy 3 3 - - -  Change in appetite 3 3 - - -  Feeling bad or failure about yourself  3 3 - - -  Trouble concentrating 3 3 - - -  Moving slowly or fidgety/restless 3 3 - - -  Suicidal thoughts 3 3 - - -  PHQ-9 Score 27 27 - - -    Flowsheet Row Integrated Behavioral Health from 07/06/2021 in [facility] ED from 04/14/2021 in [facility] EMERGENCY DEPARTMENT ED from 04/07/2021 in [facility] EMERGENCY DEPARTMENT  C-SSRS RISK CATEGORY Moderate Risk No Risk No Risk       Patient and/or Family Response: Pt receptive to tx. Pt receptive to psychoeducation provided  on depression, grief, and anxiety. Pt receptive to cognitive restructuring to decrease negative and unhelpful thoughts. Pt receptive to establishing healthy boundaries with children. Pt will utilize journaling and deep breathing.   Patient  Centered Plan: Patient is on the following Treatment Plan(s):  Depression  Assessment: Endorses SI with method of taking pills. Denies specific date/time and denies intent. Endorses protective factor as children and religion/spirituality. Endorses a hx of suicide attempt during childhood and hospitalized following attempt. Safety plan created and pt provided with crisis resources and advised to utilize crisis resources if SI arises with plan, means, and intent. Denies auditory/visual hallucinations. Patient currently experiencing depression associated with grief and physical health. Pt also appears to be overwhelmed with helping her children. Pt has difficulty with boundaries.    Patient may benefit from establishing healthy boundaries with children. LCSWA provided psychoeducation on depression, grief, and anxiety. LCSWA utilized cognitive restructuring to decrease negative and unhelpful thoughts. LCSWA provided assistance in ways to establish healthy boundaries with children. LCSWA encouraged pt to utilize deep breathing and journaling. LCSWA will refer pt for psychiatry and therapy.  Plan: Follow up with behavioral health clinician on : 08/01/21 Behavioral recommendations: Utilize provided crisis resources if SI arises with plan, means, and intent. Utilize deep breathing and journaling.  Referral(s): Integrated Art gallery manager (In Clinic), Psychiatrist, and Counselor "From scale of 1-10, how likely are you to follow plan?": 10  Amiayah Giebel C Estel Tonelli, LCSW

## 2021-07-13 NOTE — Progress Notes (Signed)
NEUROLOGY CONSULTATION NOTE  Janice [name] MRN: [id number] DOB: 21-Apr-1966  Referring provider: Fatima Blank, MD (ED referral) Primary care provider: Karle Plumber, MD  Reason for consult:  headache  Assessment/Plan:   New onset headaches - denies prior history of headaches.  May be related to uncontrolled hypertension.  Also consider increased intracranial pressure or triggered by emotional stress.  Currently complicated by medication-overuse Uncontrolled hypertension Blurred vision Memory deficits - precedes starting topiramate   1  As topiramate may contribute to memory deficits, discontinue.  Instead, start propranolol ER 80mg  daily.  Given uncontrolled blood pressure and already on an antidepressant, would defer starting a TCA such as nortriptyline.   2  Stop Fioricet due to high propensity for rebound headache.  May use OTC analgesics but instructed to limit use of pain relievers to no more than 2 days out of week to prevent risk of rebound or medication-overuse headache. 3  Must aggressively optimize blood pressure control.  Follow up with PCP. 4  Important to follow up with ophthalmology to evaluate for papilledema. 5  Check MRI of brain with and without contrast 6  Pending results, may need to consider LP to assess opening pressure and CSF analysis. 7  Follow up 3 to 4 months.    Subjective:  Janice [name] is a 56 year old female with morbid obesity and HTN who presents for headache.  History supplemented by ED notes.  She began experiencing persistent headache and visual disturbance in October.  She described a 9/10 throbbing frontal/temporal headache with photophobia and some blurred vision, confusion, slurred speech, and off balance.  No nausea, vomiting, double vision, visual obscurations, pulsatile tinnitus, numbness ane weakness.  Would wake her up out of her sleep.  She was seen in the ED on 3 occasions between 04/04/2021 and 04/14/2021.  Systolic  blood pressure had been from 159 to 200s.  CT head on 04/04/2021 personally reviewed showed chronic small vessel ischemic changes.  Follow up MRV of head personally reviewed was negative for dural venous thrombosis or stenosis.  They started subsiding in December after she was started on daily topiramate and Fioricet.  They are now 3-4/10 lasting 15-20 minutes occurring every 3 to 4 days (treats with ASA or ibuprofen daily).  She started taking Fioricet almost daily.  Still reports memory problems - gets lost driving on familiar routes or forgetting to take her medications.  Again, this started in October.  Still with blurred vision.  Has an upcoming appointment with ophthalmology.  Blood pressure has still been uncontrolled.  No prior history of headaches.  Just prior to onset of headaches, the father of her children passed away which caused a great deal of emotional distress.  Current NSAIDS/analgesics:  ASA 81mg  daily, FIoricet Current triptans:  none Current ergotamine:  none Current anti-emetic:  none Current muscle relaxants:  none  Current Antihypertensive medications:  amlodipine Current Antidepressant medications:  Wellubtrin Current Anticonvulsant medications:  topiramate 25mg  QHS Current anti-CGRP:  none Current Vitamins/Herbal/Supplements:  none Current Antihistamines/Decongestants:  none Other therapy:  none Hormone/birth control:  none   Past NSAIDS/analgesics:  naproxen Past abortive triptans:  none Past abortive ergotamine:  none Past muscle relaxants:  tizanidine Past anti-emetic:  none Past antihypertensive medications:  lisinopril, HCTZ, losartan Past antidepressant medications:  none Past anticonvulsant medications:  none Past anti-CGRP:  none Past vitamins/Herbal/Supplements:  none Past antihistamines/decongestants:  none Other past therapies:  none   PAST MEDICAL HISTORY: Past Medical History:  Diagnosis Date  Depression    history of   GERD  (gastroesophageal reflux disease)    acid reflux at night and with spicy foods   Hypertension    Morbid obesity with BMI of 40.0-44.9, adult ([place])     PAST SURGICAL HISTORY: Past Surgical History:  Procedure Laterality Date   CESAREAN SECTION     x2    MEDICATIONS: Current Outpatient Medications on File Prior to Visit  Medication Sig Dispense Refill   amLODipine (NORVASC) 10 MG tablet Take 1 tablet (10 mg total) by mouth at bedtime. 30 tablet 4   aspirin EC 81 MG tablet Take 81 mg by mouth every 4 (four) hours as needed for moderate pain. Swallow whole.     buPROPion (WELLBUTRIN SR) 150 MG 12 hr tablet Take 1 tablet by mouth daily for 1 week, then increase to 1 tablet twice daily. 60 tablet 1   butalbital-acetaminophen-caffeine (FIORICET) 50-325-40 MG tablet Take 1-2 tablets by mouth 2 (two) times daily as needed for headache. 20 tablet 1   ibuprofen (ADVIL) 600 MG tablet Take 1 tablet (600 mg total) by mouth every 6 (six) hours as needed. 30 tablet 0   spironolactone (ALDACTONE) 25 MG tablet Take 0.5 tablets (12.5 mg total) by mouth daily. 30 tablet 2   tazarotene (TAZORAC) 0.05 % gel Apply topically at bedtime. 30 g 0   tiZANidine (ZANAFLEX) 2 MG tablet Take 1 tablet (2 mg total) by mouth every 8 (eight) hours as needed for muscle spasms. 21 tablet 0   topiramate (TOPAMAX) 25 MG tablet Take 1 tablet (25 mg total) by mouth at bedtime. To decrease frequency of headaches 30 tablet 1   triamcinolone cream (KENALOG) 0.1 % Apply 1 application topically 2 (two) times daily. 30 g 2   No current facility-administered medications on file prior to visit.    ALLERGIES: Allergies  Allergen Reactions   Losartan     Chest pain   Neomycin Sulfate Swelling, Rash and Other (See Comments)    Pain in ear, swelling and redness    FAMILY HISTORY: Family History  Problem Relation Age of Onset   Hypertension Mother    Kidney disease Mother    Alcohol abuse Father    Depression Father    Drug  abuse Father    Hypertension Father    Hypertension Maternal Grandmother    Stroke Maternal Grandmother    Alzheimer's disease Maternal Grandfather    Hypertension Daughter     Objective:  Blood pressure (!) 165/120, pulse 88, height 5' (1.524 m), weight 228 lb 12.8 oz (103.8 kg), SpO2 96 %. General: No acute distress.  Patient appears well-groomed.   Head:  Normocephalic/atraumatic Eyes:  fundi examined but not visualized Neck: supple, no paraspinal tenderness, full range of motion Back: No paraspinal tenderness Heart: regular rate and rhythm Lungs: Clear to auscultation bilaterally. Vascular: No carotid bruits. Neurological Exam: Mental status: alert and oriented to person, place, and time, recent and remote memory intact, fund of knowledge intact, attention and concentration intact, speech fluent and not dysarthric, language intact. Cranial nerves: CN I: not tested CN II: pupils equal, round and reactive to light, visual fields intact CN III, IV, VI:  full range of motion, no nystagmus, no ptosis CN V: facial sensation intact. CN VII: upper and lower face symmetric CN VIII: hearing intact CN IX, X: gag intact, uvula midline CN XI: sternocleidomastoid and trapezius muscles intact CN XII: tongue midline Bulk & Tone: normal, no fasciculations. Motor:  muscle strength 5/5  throughout Sensation:  Pinprick, temperature and vibratory sensation intact. Deep Tendon Reflexes:  2+ throughout,  toes downgoing.   Finger to nose testing:  Without dysmetria.   Heel to shin:  Without dysmetria.   Gait:  Normal station and stride.  Romberg negative.    Thank you for allowing me to take part in the care of this patient.  Metta Clines, DO  CC: Karle Plumber, MD

## 2021-07-14 ENCOUNTER — Ambulatory Visit (INDEPENDENT_AMBULATORY_CARE_PROVIDER_SITE_OTHER): Payer: BLUE CROSS/BLUE SHIELD | Admitting: Neurology

## 2021-07-14 ENCOUNTER — Other Ambulatory Visit: Payer: Self-pay

## 2021-07-14 ENCOUNTER — Encounter: Payer: Self-pay | Admitting: Neurology

## 2021-07-14 VITALS — BP 180/130 | HR 88 | Ht 60.0 in | Wt 228.8 lb

## 2021-07-14 DIAGNOSIS — R519 Headache, unspecified: Secondary | ICD-10-CM

## 2021-07-14 DIAGNOSIS — R413 Other amnesia: Secondary | ICD-10-CM | POA: Diagnosis not present

## 2021-07-14 DIAGNOSIS — I10 Essential (primary) hypertension: Secondary | ICD-10-CM | POA: Diagnosis not present

## 2021-07-14 DIAGNOSIS — H538 Other visual disturbances: Secondary | ICD-10-CM

## 2021-07-14 MED ORDER — PROPRANOLOL HCL ER 80 MG PO CP24
80.0000 mg | ORAL_CAPSULE | Freq: Every day | ORAL | 5 refills | Status: DC
  Filled 2021-07-14: qty 30, 30d supply, fill #0
  Filled 2021-10-28 – 2021-11-04 (×2): qty 30, 30d supply, fill #1

## 2021-07-14 MED ORDER — PROPRANOLOL HCL ER 80 MG PO CP24: 80.0000 mg | ORAL_CAPSULE | Freq: Every day | ORAL | 5 refills | Status: DC

## 2021-07-14 NOTE — Patient Instructions (Addendum)
Stop topiramate.  Start propranolol ER 80mg  daily Stop butalbital-acetaminophen-caffeine bills. Limit use of pain relievers (such as ibuprofen or Tylenol) to no more than 2 days out of week to prevent risk of rebound or medication-overuse headache. MRI of brain with and without contrast Must see the eye doctor for formal eye exam Further recommendations pending results. Must get blood pressure under control.  Follow up with PCP Follow up 3 to 4 months.

## 2021-07-29 ENCOUNTER — Other Ambulatory Visit: Payer: Self-pay

## 2021-07-29 DIAGNOSIS — G43109 Migraine with aura, not intractable, without status migrainosus: Secondary | ICD-10-CM

## 2021-07-31 ENCOUNTER — Other Ambulatory Visit: Payer: BLUE CROSS/BLUE SHIELD

## 2021-08-01 ENCOUNTER — Ambulatory Visit: Payer: BLUE CROSS/BLUE SHIELD | Admitting: Clinical

## 2021-08-01 ENCOUNTER — Telehealth: Payer: Self-pay

## 2021-08-01 ENCOUNTER — Telehealth: Payer: Self-pay | Admitting: Neurology

## 2021-08-01 NOTE — Telephone Encounter (Signed)
Copied from CRM [id number]. Topic: General - Other >> Aug 01, 2021 12:15 PM Jaquita Rector A wrote: Reason for CRM: Patient ask for a call back to reschedule her appointment for this afternoon. Can be reached at Ph# [phone]

## 2021-08-01 NOTE — Telephone Encounter (Signed)
Left message due to insurance, had to outside source it to [facility]. Left message for patient, will await time and see if they approve site `

## 2021-08-01 NOTE — Telephone Encounter (Signed)
Patient said she needs to know when her appt is for the MRI. She said she was not aware they had canx it. GI told her to call us

## 2021-08-02 ENCOUNTER — Ambulatory Visit: Payer: BLUE CROSS/BLUE SHIELD | Admitting: Clinical

## 2021-08-04 ENCOUNTER — Ambulatory Visit: Payer: 59 | Admitting: Internal Medicine

## 2021-08-08 ENCOUNTER — Ambulatory Visit: Payer: BLUE CROSS/BLUE SHIELD | Admitting: Clinical

## 2021-08-08 NOTE — Telephone Encounter (Signed)
Received a call from [facility], they have reached out to patient multiple times to scheduled Mri brain w and wo contrast. Muliptle attempts unable to get patient, They left multiple message too. No response, I left a message to have her call at [phone] to schedule. FYI ?

## 2021-08-22 ENCOUNTER — Ambulatory Visit: Payer: Self-pay

## 2021-08-22 NOTE — Telephone Encounter (Signed)
Pt would like a call back and go over her meds.  She wants to know what she is taking for high bp too.  ? ? ?Chief Complaint: Review medications ?Symptoms: n/a ?Frequency: n/a ?Pertinent Negatives: Patient denies n/a ?Disposition: [] ED /[] Urgent Care (no appt availability in office) / [] Appointment(In office/virtual)/ []  [facility]/ [] Home Care/ [] Refused Recommended Disposition /[] [facility]/ []  Follow-up with PCP ?Additional Notes: She is out of Aldactone. Does she need to continue this medication? Please advise pt.  ?Answer Assessment - Initial Assessment Questions ?1. DRUG NAME: "What medicine do you need to have refilled?" ?    Does she still need to take the Aldactone? ?2. REFILLS REMAINING: "How many refills are remaining?" (Note: The label on the medicine or pill bottle will show how many refills are remaining. If there are no refills remaining, then a renewal may be needed.) ?    0 ?3. EXPIRATION DATE: "What is the expiration date?" (Note: The label states when the prescription will expire, and thus can no longer be refilled.) ?    N/a ?4. PRESCRIBING HCP: "Who prescribed it?" Reason: If prescribed by specialist, call should be referred to that group. ?    Dr. ?5. SYMPTOMS: "Do you have any symptoms?" ?    no ?6. PREGNANCY: "Is there any chance that you are pregnant?" "When was your last menstrual period?" ?    No ? ?Protocols used: Medication Refill and Renewal Call-A-AH ? ?

## 2021-08-22 NOTE — Telephone Encounter (Signed)
Called pt stated she already spoke with the nurse just earlier today . No other questions verbalized ?

## 2021-08-24 ENCOUNTER — Telehealth: Payer: Self-pay | Admitting: *Deleted

## 2021-08-24 NOTE — Telephone Encounter (Signed)
Copied from [place] [id number]. Topic: Appointment Scheduling - Scheduling Inquiry for Clinic ?>> Aug 22, 2021  2:13 PM Scherrie Gerlach wrote: ?Reason for CRM: pt would like [facility] to call her and schedule appt. ?

## 2021-08-31 ENCOUNTER — Telehealth: Payer: Self-pay | Admitting: Neurology

## 2021-08-31 NOTE — Telephone Encounter (Signed)
Sent [facility], awaiting date and time, referral  ?

## 2021-08-31 NOTE — Telephone Encounter (Signed)
Insurance will not cover where her referral was sent to, could it get resent to [place].   ?

## 2021-09-01 NOTE — Telephone Encounter (Signed)
Sent clinicals and new order sent for her.   ?

## 2021-09-01 NOTE — Telephone Encounter (Signed)
Sent another message to check status of this at 8:25am 09/01/2021  ?

## 2021-09-19 ENCOUNTER — Ambulatory Visit: Payer: BLUE CROSS/BLUE SHIELD | Attending: Internal Medicine | Admitting: Clinical

## 2021-09-19 DIAGNOSIS — F332 Major depressive disorder, recurrent severe without psychotic features: Secondary | ICD-10-CM

## 2021-09-26 NOTE — BH Specialist Note (Signed)
Integrated Behavioral Health Follow Up In-Person Visit ? ?MRN: [id number] ?Name: Janice [name] ? ?Number of [facility] Clinician visits: 2- Second Visit ? ?Session Start time: [id number] ?  ?Session End time: [id number] ? ?Total time in minutes: 65 ? ? ?Types of Service: Individual psychotherapy ? ?Interpretor:No. Interpretor Name and Language: N/A ? ?Subjective: ?Janice [name] is a 56 y.o. female accompanied by  self ?Patient was referred by Karle Plumber, MD for Depression. ?Patient reports the following symptoms/concerns: Reports feeling depressed. Reports that she feels better. Reports that she has been going to church. Reports that she does worry about her health and is supposed to be receiving an MRI. Reports difficulty with her memory at times. Reports hx of trauma. ?Duration of problem: since childhood; Severity of problem: severe ? ?Objective: ?Mood: Anxious and Affect: Appropriate ?Risk of harm to self or others: No plan to harm self or others ? ?Life Context: ?Family and Social: Pt currently lives with her son. Pt has three adult children.  ?School/Work: Pt is unemployed. Pt receives financial support from her son.  ?Self-Care: Denies substance use. Pt currently prescribed wellbutrin. Pt has been going to church as self-care. ?Life Changes: Pt's children's father died. Pt is also experiencing physical health changes and trouble with memory.  ? ?Patient and/or Family's Strengths/Protective Factors: ?Sense of purpose ? ?Goals Addressed: ?Patient will: ? Reduce symptoms of: anxiety and depression  ? Increase knowledge and/or ability of: coping skills  ? Demonstrate ability to: Increase healthy adjustment to current life circumstances and Increase adequate support systems for patient/family ? ?Progress towards Goals: ?Ongoing ? ?Interventions: ?Interventions utilized:  CBT Cognitive Behavioral Therapy and Supportive Counseling ?Standardized Assessments completed:  MMSE ? ?  09/26/2021  ?  2:04 PM   ?MMSE - Mini Mental State Exam  ?Orientation to time 5  ?Orientation to Place 5  ?Registration 3  ?Attention/ Calculation 5  ?Recall 3  ?Language- name 2 objects 2  ?Language- repeat 1  ?Language- follow 3 step command 3  ?Language- read & follow direction 1  ?Write a sentence 1  ?Copy design 1  ?Total score 30  ?  ?Patient and/or Family Response: Pt receptive to tx. Pt receptive to psychoeducation on depression. Pt receptive to cognitive restructuring. Pt receptive to deep breathing, adjusting night routine to assist with sleep, and continuing to go to church. ? ?Patient Centered Plan: ?Patient is on the following Treatment Plan(s): Depression ? ?Assessment: ?Denies SI/HI. Patient currently experiencing depression however pt's mood is improving since adhering to medication and going to church. Pt does have significant hx of trauma. Pt appears to worry about her health and that her memory is declining.  ? ?Patient may benefit from outpatient therapy as pt's insurance has changed. LCSW will still send referral for therapy. LCSW provided psychoeducation on depression. LCSW encouraged pt to utilize deep breathing, adjust night routine to assist with sleep, and continue going to church. LCSW encouraged pt to continue adhering to medication. LCSW discussed her departure from [facility]. LCSW will refer pt. LCSW provided pt with additional counseling resources. ? ?Plan: ?Follow up with behavioral health clinician on : PRN ?Behavioral recommendations: Continue adhering to medication, adjust night routine to assist with sleep, continue going to church and utilize deep breathing. ?Referral(s): Counselor ?"From scale of 1-10, how likely are you to follow plan?": 10 ? ?Karin Pinedo C Kaydence Baba, LCSW ? ? ?

## 2021-09-27 NOTE — Telephone Encounter (Signed)
Patient called to check on the status of the order for her MRI, she is still waiting to hear back for scheduling. ?

## 2021-09-27 NOTE — Telephone Encounter (Signed)
Please call [facility] per christuy order added and faxed over.  ?

## 2021-10-13 ENCOUNTER — Ambulatory Visit
Admission: RE | Admit: 2021-10-13 | Discharge: 2021-10-13 | Disposition: A | Discharge status: Home / Self Care | Payer: Commercial Managed Care - HMO | Source: Ambulatory Visit | Attending: Neurology | Admitting: Neurology

## 2021-10-13 IMAGING — MR MR HEAD WO/W CM
13 series · 48 of 48 positions shown · IV contrast (agent unspecified)
Comparison: None Available.

CLINICAL DATA: Migraine headaches

EXAM:
MRI HEAD WITHOUT AND WITH CONTRAST
TECHNIQUE: Multiplanar, multiecho pulse sequences of the brain and surrounding
structures were obtained without and with intravenous contrast.
CONTRAST:  10 mL Vueway

[Series 2: T1 · sagittal · 5.0mm · 0.45mm/px · 3 of 23 slices shown]
[im 1/23]
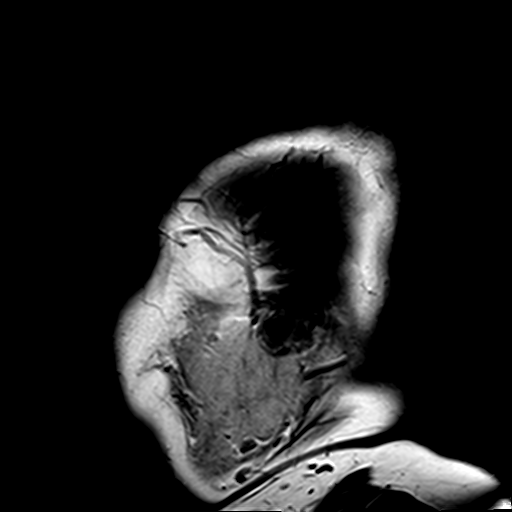
[im 12/23]
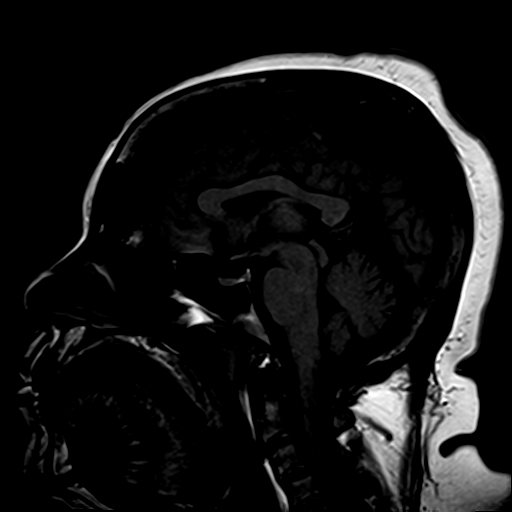
[im 23/23]
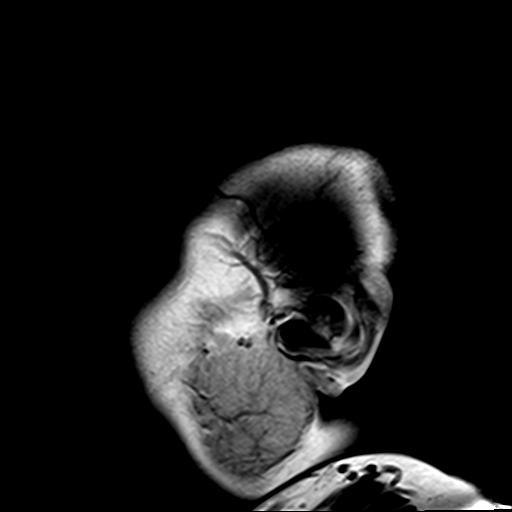

[Series 3: DWI · axial · 3.0mm · 1.80mm/px · z∈[-56,+89]mm · 7 of 100 slices shown (1 of 4)]
[im 1/100]
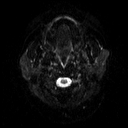
[im 17/100]
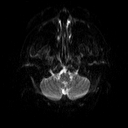
[im 34/100]
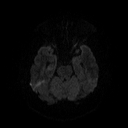
[im 50/100]
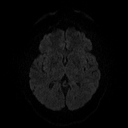
[im 67/100]
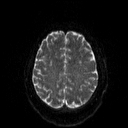
[im 83/100]
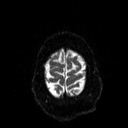
[im 100/100]
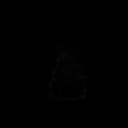

[Series 4: DWI · axial · 3.0mm · 1.80mm/px · z∈[-56,+89]mm · 3 of 49 slices shown (2 of 4)]
[im 1/49]
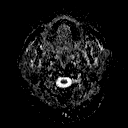
[im 25/49]
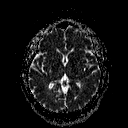
[im 49/49]
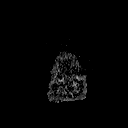

[Series 5: DWI · coronal · 5.0mm · 1.80mm/px · 4 of 68 slices shown (3 of 4)]
[im 1/68]
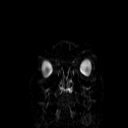
[im 23/68]
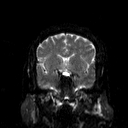
[im 45/68]
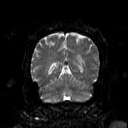
[im 68/68]
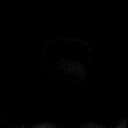

[Series 6: DWI · coronal · 5.0mm · 1.80mm/px · 2 of 34 slices shown (4 of 4)]
[im 1/34]
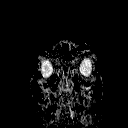
[im 34/34]
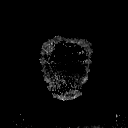

[Series 7: T2 · axial · 5.0mm · 0.60mm/px · 1 of 22 slices shown (1 of 2)]
[im 1/22]
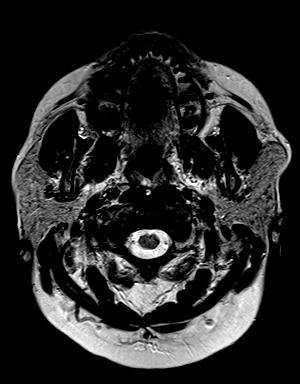

[Series 8: FLAIR · axial · 3.0mm · 0.45mm/px · z∈[-50,+83]mm · 2 of 30 slices shown]
[im 1/30]
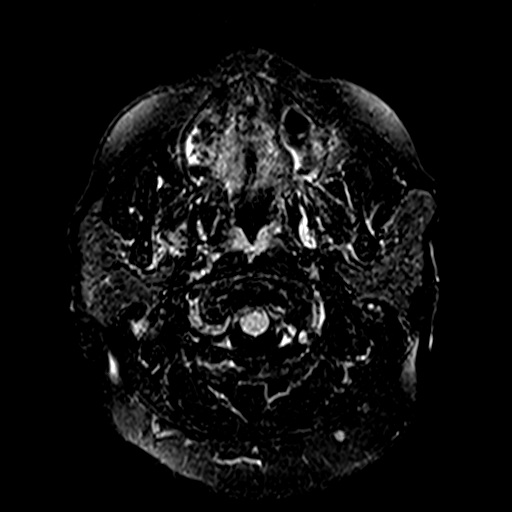
[im 30/30]
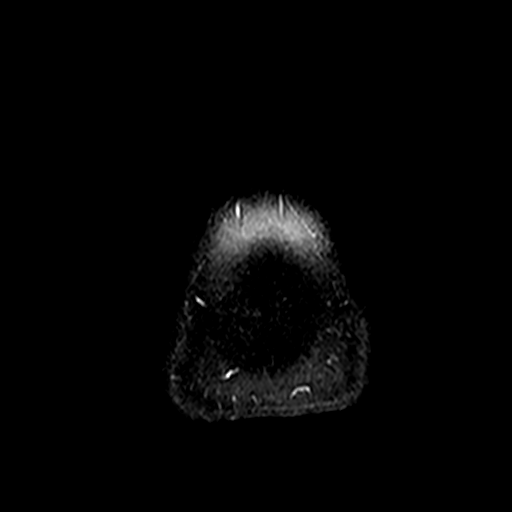

[Series 9: mip_images(sw) · axial · 32.0mm · 0.90mm/px · z∈[-39,+72]mm · 2 of 29 slices shown]
[im 1/29]
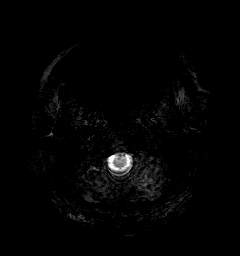
[im 29/29]
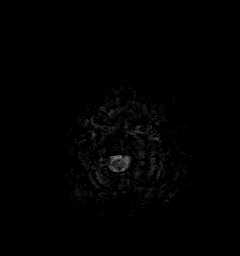

[Series 10: swi_images · axial · 4.0mm · 0.90mm/px · z∈[-53,+86]mm · 2 of 36 slices shown]
[im 1/36]
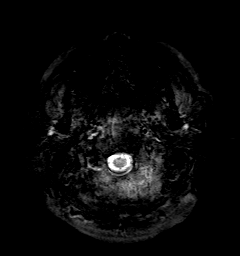
[im 36/36]
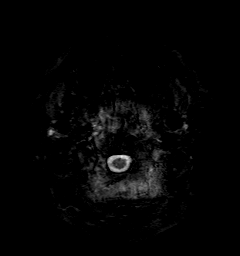

[Series 11: t1_mpr_tra · axial · 1.0mm · 0.75mm/px · z∈[-54,+88]mm · 9 of 144 slices shown]
[im 1/144]
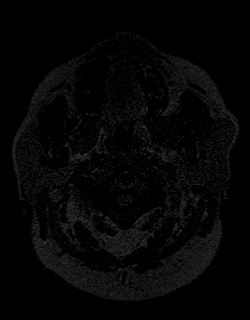
[im 18/144]
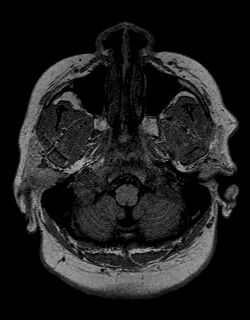
[im 36/144]
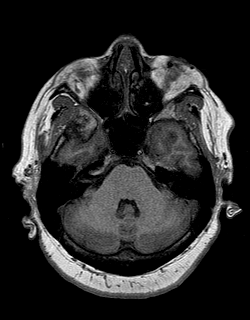
[im 54/144]
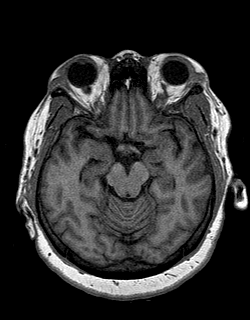
[im 72/144]
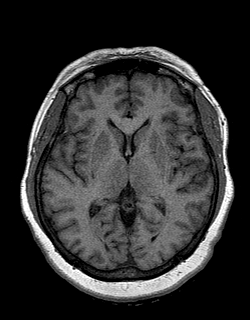
[im 90/144]
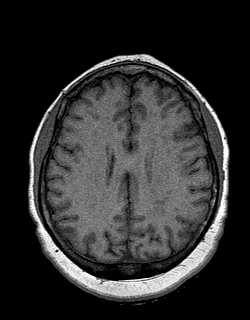
[im 108/144]
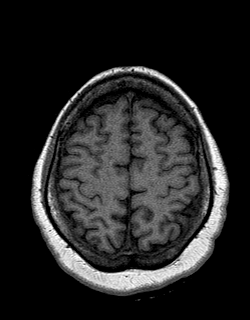
[im 126/144]
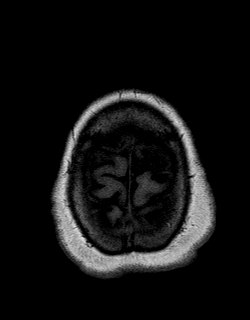
[im 144/144]
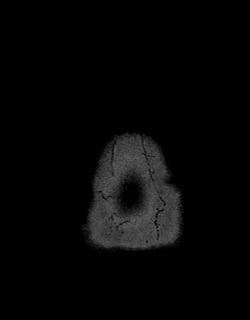

[Series 12: T2 · coronal · 5.0mm · 0.45mm/px · 2 of 25 slices shown (2 of 2)]
[im 1/25]
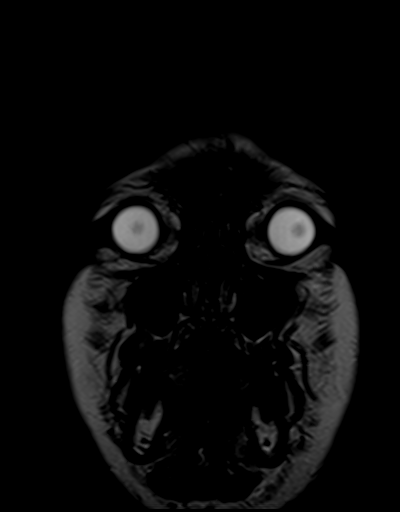
[im 25/25]
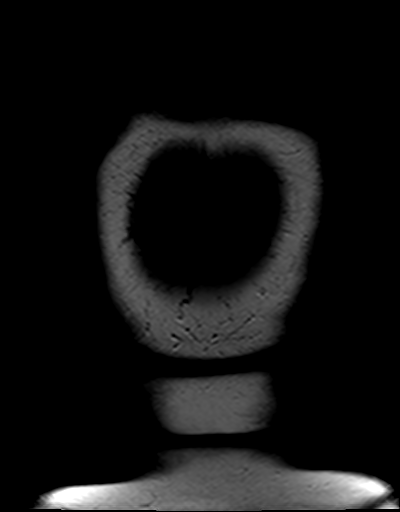

[Series 13: t1_mpr_tra post · axial · 1.0mm · 0.75mm/px · z∈[-54,+88]mm · 9 of 144 slices shown]
[im 1/144]
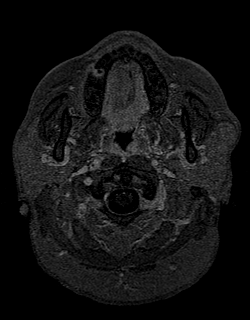
[im 18/144]
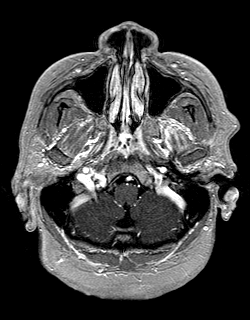
[im 36/144]
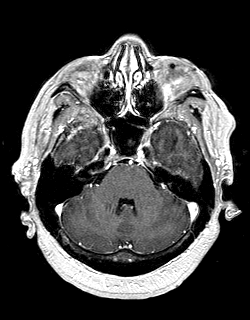
[im 54/144]
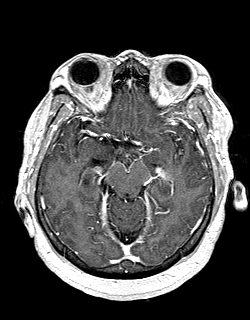
[im 72/144]
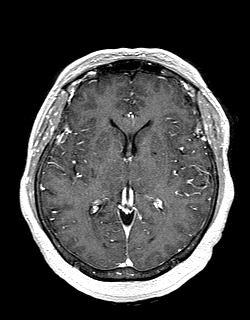
[im 90/144]
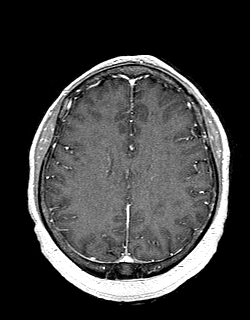
[im 108/144]
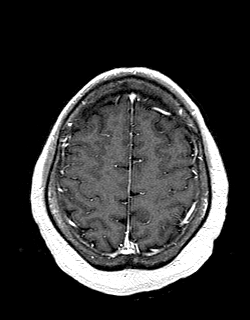
[im 126/144]
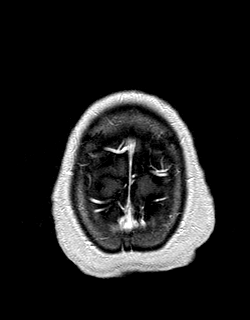
[im 144/144]
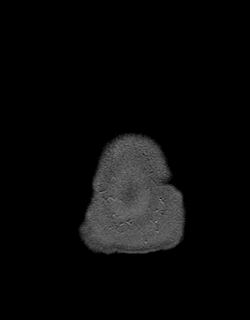

[Series 14: post cor · coronal · 5.0mm · 0.45mm/px · 2 of 25 slices shown]
[im 1/25]
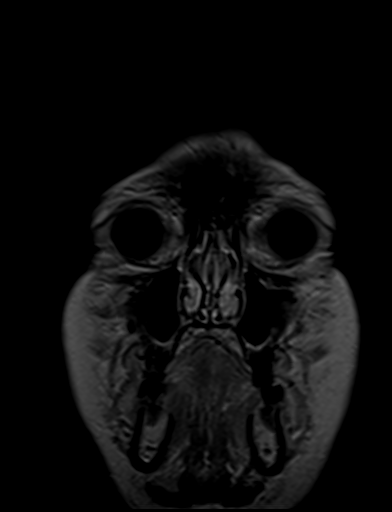
[im 25/25]
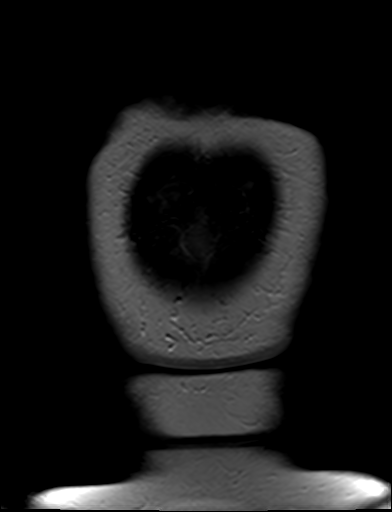

[48 of 48 positions shown; findings below may reference images not displayed]

FINDINGS: Brain: There is no acute infarction or intracranial hemorrhage.
There is no intracranial mass, mass effect, or edema. There is no
hydrocephalus or extra-axial fluid collection. Ventricles and sulci
are normal in size and configuration. Patchy foci of T2
hyperintensity in the supratentorial white matter are nonspecific
but may reflect mild to moderate chronic microvascular ischemic
changes. There is also more confluent, but non expansile T2
hyperintensity involving the splenium of the corpus callosum with a
few small foci of apparent chronic infarction on the right. No
abnormal enhancement.

Vascular: Major vessel flow voids at the skull base are preserved.

Skull and upper cervical spine: Normal marrow signal is preserved.

Sinuses/Orbits: Minor mucosal thickening. Chronic deformity of the
left medial orbital wall may be post-traumatic.

Other: Sella is unremarkable. Mild patchy left mastoid fluid
opacification.
IMPRESSION: No evidence of recent infarction or hemorrhage. No abnormal
enhancement.

Probable mild to moderate chronic microvascular ischemic changes.

Abnormal signal along the splenium of the corpus callosum almost
certainly reflects gliosis. Consideration could be given to a
follow-up noncontrast study in 1 year.

## 2021-10-13 MED ORDER — VUEWAY 0.5 MMOL/ML IV SOLN
10.0000 mL | Freq: Once | INTRAVENOUS | 0 refills | Status: DC | PRN
Start: 2021-10-13 — End: 2022-07-10

## 2021-10-13 MED ORDER — GADOPICLENOL 0.5 MMOL/ML IV SOLN
10.0000 mL | Freq: Once | INTRAVENOUS | Status: AC | PRN
  Administered 2021-10-13: 10 mL via INTRAVENOUS

## 2021-10-18 ENCOUNTER — Ambulatory Visit: Payer: Commercial Managed Care - HMO | Attending: Internal Medicine | Admitting: Internal Medicine

## 2021-10-18 ENCOUNTER — Encounter: Payer: Self-pay | Admitting: Internal Medicine

## 2021-10-18 VITALS — BP 159/102 | HR 87 | Temp 98.9°F | Resp 18 | Ht 60.0 in | Wt 231.0 lb

## 2021-10-18 DIAGNOSIS — M25561 Pain in right knee: Secondary | ICD-10-CM | POA: Diagnosis not present

## 2021-10-18 DIAGNOSIS — I10 Essential (primary) hypertension: Secondary | ICD-10-CM

## 2021-10-18 DIAGNOSIS — G8929 Other chronic pain: Secondary | ICD-10-CM

## 2021-10-18 MED ORDER — AMLODIPINE BESYLATE 10 MG PO TABS
10.0000 mg | ORAL_TABLET | Freq: Every day | ORAL | 4 refills | Status: DC
  Filled 2021-10-18 – 2021-11-04 (×3): qty 30, 30d supply, fill #0
  Filled 2022-01-24: qty 30, 30d supply, fill #1
  Filled 2022-03-15: qty 30, 30d supply, fill #2
  Filled 2022-08-01: qty 30, 30d supply, fill #3
  Filled 2022-10-16: qty 30, 30d supply, fill #4

## 2021-10-18 MED ORDER — SPIRONOLACTONE 25 MG PO TABS
12.5000 mg | ORAL_TABLET | Freq: Every day | ORAL | 2 refills | Status: DC
  Filled 2021-10-18: qty 30, 60d supply, fill #0

## 2021-10-18 MED ORDER — DICLOFENAC SODIUM 1 % EX GEL
2.0000 g | Freq: Four times a day (QID) | CUTANEOUS | 1 refills | Status: DC
  Filled 2021-10-18: qty 100, 12d supply, fill #0

## 2021-10-18 NOTE — Progress Notes (Signed)
Patient has eaten and taken medication today  ?Patient reports R knee pain for the past 2 months with increased throughout today. ?

## 2021-10-18 NOTE — Progress Notes (Signed)
? ? ?Patient ID: Janice [name], female    DOB: 09/23/1965  MRN: [id number] ? ?CC: Follow-up ( ) ? ? ?Subjective: ?Janice [name] is a 56 y.o. female who presents for chronic ds management ?Her concerns today include:  ?Pt with hx of HTN, tob dep, Vit D def, preDM, MDD, +HPV on pap, GERD, chronic hypokalemia ? ?I saw her for new pt visit 05/2021.  At that time she c/o daily HA assessed to be possible migraines, depression for which she was started on Wellbutrin and referred to [facility], discussed smoking cessation and was started on Spironolactone 12.5 mg and Norvasc 10 mg for HTN.  She has had several counseling sessions with our then LCSW Ms. Massie Maroon who left our practice a few weeks ago. ? ?Did not bring meds with her today and does not recall names.  Needing Rfs on BP meds. ? ?HTN:  out of Spironolactone x1 mth.  Taking Norvasc at bedtime.  Started on Propranolol 80 mg by Dr. Everlena Cooper 07/25/21. Topamax and Fioricet d/c.  BP though to be playing a role in HA reported ?-reports she sometimes miss taking the Norvasc at nights because she could not remember if she had taken already.  Has a med box but reports several of her meds look the same ?-reports she may have difficulty in filling meds due to lack of finances. 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  She is in the process of applying for disability.  Has functional capacity forms with her today that she would like for me to fill out to assist her with getting disability. ?No device to check BP ?Limits salt in foods ? ? ?C/o Pain RT knee x few mths.  No known injury to the knee. ?Reports increased pain when she walks and when getting out of the bathtub.  The knee pops with movement.  Also bothersome when she is sleeping at nights and extends the knee joint. ?Seen at [facility] 01/2021 for acute right knee pain.Marland Kitchen   ?She is morbidly obese for height.  She feels she does not eat much.  Not able to move as much as she can due to current  issue with her right knee. ?Patient Active Problem List  ? Diagnosis Date Noted  ? Migraine with aura and without status migrainosus, not intractable 05/31/2021  ? Severe major depression without psychotic features (HCC) 05/31/2021  ? Pap smear abnormality of cervix/human papillomavirus (HPV) positive 11/06/2019  ? Trichomonas infection 11/06/2019  ? Prediabetes 04/04/2019  ? Perimenopausal 03/06/2019  ? Psoriasis 03/06/2019  ? Vitamin D deficiency 09/29/2015  ? CHEST PAIN 10/04/2007  ? OBESITY, NOS 08/02/2006  ? TOBACCO DEPENDENCE 08/02/2006  ? Depressive disorder, not elsewhere classified 08/02/2006  ? HYPERTENSION, BENIGN SYSTEMIC 08/02/2006  ? Esophageal reflux 08/02/2006  ?  ? ?Current Outpatient Medications on File Prior to Visit  ?Medication Sig Dispense Refill  ? aspirin EC 81 MG tablet Take 81 mg by mouth every 4 (four) hours as needed for moderate pain. Swallow whole.    ? buPROPion (WELLBUTRIN SR) 150 MG 12 hr tablet Take 1 tablet by mouth daily for 1 week, then increase to 1 tablet twice daily. 60 tablet 1  ? gadopiclenol (VUEWAY) 0.5 MMOL/ML solution Inject 10 mLs into the vein once as needed for up to 1 dose for contrast. 10 mL 0  ? ibuprofen (ADVIL) 600 MG tablet Take 1 tablet (600 mg total) by mouth every 6 (six) hours  as needed. 30 tablet 0  ? propranolol ER (INDERAL LA) 80 MG 24 hr capsule Take 1 capsule (80 mg total) by mouth daily. 30 capsule 5  ? tazarotene (TAZORAC) 0.05 % gel Apply topically at bedtime. 30 g 0  ? tiZANidine (ZANAFLEX) 2 MG tablet Take 1 tablet (2 mg total) by mouth every 8 (eight) hours as needed for muscle spasms. 21 tablet 0  ? triamcinolone cream (KENALOG) 0.1 % Apply 1 application topically 2 (two) times daily. 30 g 2  ? ?No current facility-administered medications on file prior to visit.  ? ? ?Allergies  ?Allergen Reactions  ? Losartan   ?  Chest pain  ? Neomycin Sulfate Swelling, Rash and Other (See Comments)  ?  Pain in ear, swelling and redness  ? ? ?Social History   ? ?Socioeconomic History  ? Marital status: Single  ?  Spouse name: Not on file  ? Number of children: 3  ? Years of education: Not on file  ? Highest education level: Not on file  ?Occupational History  ? Not on file  ?Tobacco Use  ? Smoking status: Every Day  ?  Packs/day: 0.25  ?  Years: 40.00  ?  Pack years: 10.00  ?  Types: Cigarettes  ? Smokeless tobacco: Never  ? Tobacco comments:  ?  Wants to quit in 2021  ?Vaping Use  ? Vaping Use: Never used  ?Substance and Sexual Activity  ? Alcohol use: Yes  ?  Comment: occ, 2 drinks  ? Drug use: No  ? Sexual activity: Yes  ?  Birth control/protection: Condom  ?Other Topics Concern  ? Not on file  ?Social History Narrative  ? Not on file  ? ?Social Determinants of Health  ? ?Financial Resource Strain: Not on file  ?Food Insecurity: Not on file  ?Transportation Needs: Not on file  ?Physical Activity: Not on file  ?Stress: Not on file  ?Social Connections: Not on file  ?Intimate Partner Violence: Not on file  ? ? ?Family History  ?Problem Relation Age of Onset  ? Hypertension Mother   ? Kidney disease Mother   ? Alcohol abuse Father   ? Depression Father   ? Drug abuse Father   ? Hypertension Father   ? Hypertension Maternal Grandmother   ? Stroke Maternal Grandmother   ? Alzheimer's disease Maternal Grandfather   ? Hypertension Daughter   ? ? ?Past Surgical History:  ?Procedure Laterality Date  ? CESAREAN SECTION    ? x2  ? ? ?ROS: ?Review of Systems ?Negative except as stated above ? ?PHYSICAL EXAM: ?BP (!) 159/102 (BP Location: Left Arm, Patient Position: Sitting, Cuff Size: Large)   Pulse 87   Temp 98.9 ?F (37.2 ?C) (Oral)   Resp 18   Ht 5' (1.524 m)   Wt 231 lb (104.8 kg)   SpO2 97%   BMI 45.11 kg/m?   ?Wt Readings from Last 3 Encounters:  ?10/18/21 231 lb (104.8 kg)  ?07/14/21 228 lb 12.8 oz (103.8 kg)  ?05/31/21 227 lb (103 kg)  ? ? ?Physical Exam ? ?General appearance - alert, well appearing, middle-age African-American female and in no distress ?Mental  status -flat affect.  She expressed feeling a bit forgetful. ?Chest - clear to auscultation, no wheezes, rales or rhonchi, symmetric air entry ?Heart - normal rate, regular rhythm, normal S1, S2, no murmurs, rubs, clicks or gallops ?Extremities - peripheral pulses normal, no pedal edema, no clubbing or cyanosis ? ? ?  09/26/2021  ?  2:04 PM  ?MMSE - Mini Mental State Exam  ?Orientation to time 5  ?Orientation to Place 5  ?Registration 3  ?Attention/ Calculation 5  ?Recall 3  ?Language- name 2 objects 2  ?Language- repeat 1  ?Language- follow 3 step command 3  ?Language- read & follow direction 1  ?Write a sentence 1  ?Copy design 1  ?Total score 30  ? ? ?  10/18/2021  ?  5:16 PM 07/06/2021  ?  4:28 PM 05/31/2021  ?  3:40 PM  ?Depression screen PHQ 2/9  ?Decreased Interest 2 3 3   ?Down, Depressed, Hopeless 3 3 3   ?PHQ - 2 Score 5 6 6   ?Altered sleeping  3 3  ?Tired, decreased energy 3 3 3   ?Change in appetite 2 3 3   ?Feeling bad or failure about yourself  3 3 3   ?Trouble concentrating 3 3 3   ?Moving slowly or fidgety/restless 0 3 3  ?Suicidal thoughts 0 3 3  ?PHQ-9 Score  27 27  ? ? ? ? ?  Latest Ref Rng & Units 05/31/2021  ?  3:44 PM 04/14/2021  ? 10:35 PM 04/08/2021  ?  6:00 AM  ?CMP  ?Glucose 70 - 99 mg/dL 95        ?BUN 6 - 24 mg/dL 18   22   22     ?Creatinine 0.57 - 1.00 mg/dL     06/02/2021    ?Sodium 134 - 144 mmol/L 143   134   136    ?Potassium 3.5 - 5.2 mmol/L 3.6   2.6   2.7    ?Chloride 96 - 106 mmol/L 100   94   96    ?CO2 20 - 29 mmol/L 31   31   29     ?Calcium 8.7 - 10.2 mg/dL 9.1   8.9   9.0    ?Total Protein 6.5 - 8.1 g/dL  7.8     ?Total Bilirubin 0.3 - 1.2 mg/dL  0.8     ?Alkaline Phos 38 - 126 U/L  98     ?AST 15 - 41 U/L  25     ?ALT 0 - 44 U/L  27     ? ?Lipid Panel  ?   ?Component Value Date/Time  ? CHOL 162 05/31/2021 1544  ? TRIG 91 05/31/2021 1544  ? HDL 43 05/31/2021 1544  ? CHOLHDL 3.8 05/31/2021 1544  ? CHOLHDL 3.7 08/24/2016 1634  ? VLDL 22 08/24/2016 1634  ? LDLCALC 102 (H)  05/31/2021 1544  ? ? ?CBC ?   ?Component Value Date/Time  ? WBC 10.9 (H) 04/14/2021 2235  ? RBC 5.89 (H) 04/14/2021 2235  ? HGB 14.0 04/14/2021 2235  ? HGB 13.5 03/06/2019 1344  ? HCT 45.0 04/14/2021 2235  ? HCT 44.3 10

## 2021-10-18 NOTE — Patient Instructions (Addendum)
Please return to the laboratory after you have been back on all 3 blood pressure medications for 1 week.  ?Come to the radiology down stairs to have x-rays done of the right knee later this week. ?Healthy Eating ?Following a healthy eating pattern may help you to achieve and maintain a healthy body weight, reduce the risk of chronic disease, and live a long and productive life. It is important to follow a healthy eating pattern at an appropriate calorie level for your body. Your nutritional needs should be met primarily through food by choosing a variety of nutrient-rich foods. ?What are tips for following this plan? ?Reading food labels ?Read labels and choose the following: ?Reduced or low sodium. ?Juices with 100% fruit juice. ?Foods with low saturated fats and high polyunsaturated and monounsaturated fats. ?Foods with whole grains, such as whole wheat, cracked wheat, brown rice, and wild rice. ?Whole grains that are fortified with folic acid. This is recommended for women who are pregnant or who want to become pregnant. ?Read labels and avoid the following: ?Foods with a lot of added sugars. These include foods that contain brown sugar, corn sweetener, corn syrup, dextrose, fructose, glucose, high-fructose corn syrup, honey, invert sugar, lactose, malt syrup, maltose, molasses, raw sugar, sucrose, trehalose, or turbinado sugar. ?Do not eat more than the following amounts of added sugar per day: ?6 teaspoons (25 g) for women. ?9 teaspoons (38 g) for men. ?Foods that contain processed or refined starches and grains. ?Refined grain products, such as white flour, degermed cornmeal, white bread, and white rice. ?Shopping ?Choose nutrient-rich snacks, such as vegetables, whole fruits, and nuts. Avoid high-calorie and high-sugar snacks, such as potato chips, fruit snacks, and candy. ?Use oil-based dressings and spreads on foods instead of solid fats such as butter, stick margarine, or cream cheese. ?Limit pre-made  sauces, mixes, and "instant" products such as flavored rice, instant noodles, and ready-made pasta. ?Try more plant-protein sources, such as tofu, tempeh, black beans, edamame, lentils, nuts, and seeds. ?Explore eating plans such as the Mediterranean diet or vegetarian diet. ?Cooking ?Use oil to saut? or stir-fry foods instead of solid fats such as butter, stick margarine, or lard. ?Try baking, boiling, grilling, or broiling instead of frying. ?Remove the fatty part of meats before cooking. ?Steam vegetables in water or broth. ?Meal planning ? ?At meals, imagine dividing your plate into fourths: ?One-half of your plate is fruits and vegetables. ?One-fourth of your plate is whole grains. ?One-fourth of your plate is protein, especially lean meats, poultry, eggs, tofu, beans, or nuts. ?Include low-fat dairy as part of your daily diet. ?Lifestyle ?Choose healthy options in all settings, including home, work, school, restaurants, or stores. ?Prepare your food safely: ?Wash your hands after handling raw meats. ?Keep food preparation surfaces clean by regularly washing with hot, soapy water. ?Keep raw meats separate from ready-to-eat foods, such as fruits and vegetables. ?Cook seafood, meat, poultry, and eggs to the recommended internal temperature. ?Store foods at safe temperatures. In general: ?Keep cold foods at 40?F (4.4?C) or below. ?Keep hot foods at 140?F (60?C) or above. ?Keep your freezer at 0?F (-17.8?C) or below. ?Foods are no longer safe to eat when they have been between the temperatures of 40?-140?F (4.4-60?C) for more than 2 hours. ?What foods should I eat? ?Fruits ?Aim to eat 2 cup-equivalents of fresh, canned (in natural juice), or frozen fruits each day. Examples of 1 cup-equivalent of fruit include 1 small apple, 8 large strawberries, 1 cup canned fruit, ? cup dried  fruit, or 1 cup 100% juice. ?Vegetables ?Aim to eat 2?-3 cup-equivalents of fresh and frozen vegetables each day, including different  varieties and colors. Examples of 1 cup-equivalent of vegetables include 2 medium carrots, 2 cups raw, leafy greens, 1 cup chopped vegetable (raw or cooked), or 1 medium baked potato. ?Grains ?Aim to eat 6 ounce-equivalents of whole grains each day. Examples of 1 ounce-equivalent of grains include 1 slice of bread, 1 cup ready-to-eat cereal, 3 cups popcorn, or ? cup cooked rice, pasta, or cereal. ?Meats and other proteins ?Aim to eat 5-6 ounce-equivalents of protein each day. Examples of 1 ounce-equivalent of protein include 1 egg, 1/2 cup nuts or seeds, or 1 tablespoon (16 g) peanut butter. A cut of meat or fish that is the size of a deck of cards is about 3-4 ounce-equivalents. ?Of the protein you eat each week, try to have at least 8 ounces come from seafood. This includes salmon, trout, herring, and anchovies. ?Dairy ?Aim to eat 3 cup-equivalents of fat-free or low-fat dairy each day. Examples of 1 cup-equivalent of dairy include 1 cup (240 mL) milk, 8 ounces (250 g) yogurt, 1? ounces (44 g) natural cheese, or 1 cup (240 mL) fortified soy milk. ?Fats and oils ?Aim for about 5 teaspoons (21 g) per day. Choose monounsaturated fats, such as canola and olive oils, avocados, peanut butter, and most nuts, or polyunsaturated fats, such as sunflower, corn, and soybean oils, walnuts, pine nuts, sesame seeds, sunflower seeds, and flaxseed. ?Beverages ?Aim for six 8-oz glasses of water per day. Limit coffee to three to five 8-oz cups per day. ?Limit caffeinated beverages that have added calories, such as soda and energy drinks. ?Limit alcohol intake to no more than 1 drink a day for nonpregnant women and 2 drinks a day for men. One drink equals 12 oz of beer (355 mL), 5 oz of wine (148 mL), or 1? oz of hard liquor (44 mL). ?Seasoning and other foods ?Avoid adding excess amounts of salt to your foods. Try flavoring foods with herbs and spices instead of salt. ?Avoid adding sugar to foods. ?Try using oil-based dressings,  sauces, and spreads instead of solid fats. ?This information is based on general [place] nutrition guidelines. For more information, visit [employer]. Exact amounts may vary based on your nutrition needs. ?Summary ?A healthy eating plan may help you to maintain a healthy weight, reduce the risk of chronic diseases, and stay active throughout your life. ?Plan your meals. Make sure you eat the right portions of a variety of nutrient-rich foods. ?Try baking, boiling, grilling, or broiling instead of frying. ?Choose healthy options in all settings, including home, work, school, restaurants, or stores. ?This information is not intended to replace advice given to you by your health care provider. Make sure you discuss any questions you have with your health care provider. ?Document Revised: 01/18/2021 Document Reviewed: 01/18/2021 ?Elsevier Patient Education ? [place]. ? ?

## 2021-10-19 ENCOUNTER — Other Ambulatory Visit: Payer: Self-pay

## 2021-10-26 ENCOUNTER — Ambulatory Visit: Payer: Commercial Managed Care - HMO | Admitting: Orthopaedic Surgery

## 2021-10-26 ENCOUNTER — Other Ambulatory Visit: Payer: Self-pay

## 2021-10-28 ENCOUNTER — Other Ambulatory Visit: Payer: Self-pay

## 2021-11-02 ENCOUNTER — Other Ambulatory Visit: Payer: Self-pay

## 2021-11-04 ENCOUNTER — Other Ambulatory Visit: Payer: Self-pay

## 2021-11-08 ENCOUNTER — Ambulatory Visit: Payer: Commercial Managed Care - HMO | Admitting: Orthopaedic Surgery

## 2021-11-14 ENCOUNTER — Ambulatory Visit (HOSPITAL_COMMUNITY)
Admission: EM | Admit: 2021-11-14 | Discharge: 2021-11-14 | Disposition: A | Discharge status: Home / Self Care | Payer: Commercial Managed Care - HMO | Attending: Emergency Medicine | Admitting: Emergency Medicine

## 2021-11-14 ENCOUNTER — Encounter (HOSPITAL_COMMUNITY): Payer: Self-pay | Admitting: Emergency Medicine

## 2021-11-14 ENCOUNTER — Ambulatory Visit (INDEPENDENT_AMBULATORY_CARE_PROVIDER_SITE_OTHER): Payer: Commercial Managed Care - HMO

## 2021-11-14 DIAGNOSIS — M546 Pain in thoracic spine: Secondary | ICD-10-CM

## 2021-11-14 DIAGNOSIS — M25561 Pain in right knee: Secondary | ICD-10-CM

## 2021-11-14 DIAGNOSIS — M549 Dorsalgia, unspecified: Secondary | ICD-10-CM

## 2021-11-14 DIAGNOSIS — M545 Low back pain, unspecified: Secondary | ICD-10-CM

## 2021-11-14 DIAGNOSIS — M542 Cervicalgia: Secondary | ICD-10-CM

## 2021-11-14 DIAGNOSIS — M25531 Pain in right wrist: Secondary | ICD-10-CM
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
## 2021-11-14 MED ORDER — CYCLOBENZAPRINE HCL 10 MG PO TABS: 10.0000 mg | ORAL_TABLET | Freq: Two times a day (BID) | ORAL | 0 refills | Status: DC | PRN

## 2021-11-14 MED ORDER — KETOROLAC TROMETHAMINE 60 MG/2ML IM SOLN
30.0000 mg | Freq: Once | INTRAMUSCULAR | Status: AC
  Administered 2021-11-14: 30 mg via INTRAMUSCULAR

## 2021-11-14 MED ORDER — KETOROLAC TROMETHAMINE 30 MG/ML IJ SOLN
INTRAMUSCULAR | Status: AC
  Filled 2021-11-14: qty 1
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
## 2021-11-14 NOTE — Discharge Instructions (Addendum)
Please take muscle relaxer as prescribed.  If this medicine makes you drowsy, take it only at nighttime.  I recommend using this medicine in combination with ibuprofen/Tylenol.  If your symptoms persist despite medication I recommend following up with the orthopedic specialists.
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
## 2021-11-16 ENCOUNTER — Ambulatory Visit: Payer: Commercial Managed Care - HMO | Admitting: Neurology

## 2021-11-22 ENCOUNTER — Ambulatory Visit: Payer: Commercial Managed Care - HMO | Admitting: Orthopaedic Surgery

## 2021-11-24 ENCOUNTER — Ambulatory Visit: Payer: Commercial Managed Care - HMO | Admitting: Pharmacist

## 2021-11-30 ENCOUNTER — Ambulatory Visit: Payer: Commercial Managed Care - HMO | Admitting: Orthopaedic Surgery

## 2021-12-13 ENCOUNTER — Encounter: Payer: Self-pay | Admitting: Orthopaedic Surgery

## 2021-12-13 ENCOUNTER — Ambulatory Visit (INDEPENDENT_AMBULATORY_CARE_PROVIDER_SITE_OTHER): Payer: Commercial Managed Care - HMO | Admitting: Orthopaedic Surgery

## 2021-12-13 DIAGNOSIS — M1711 Unilateral primary osteoarthritis, right knee: Secondary | ICD-10-CM | POA: Diagnosis not present

## 2021-12-13 HISTORY — DX: Morbid (severe) obesity due to excess calories: E66.01

## 2021-12-13 HISTORY — DX: Unilateral primary osteoarthritis, right knee: M17.11

## 2021-12-13 MED ORDER — MELOXICAM 7.5 MG PO TABS: 7.5000 mg | ORAL_TABLET | Freq: Two times a day (BID) | ORAL | 2 refills | Status: DC | PRN
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
## 2021-12-14 ENCOUNTER — Ambulatory Visit (INDEPENDENT_AMBULATORY_CARE_PROVIDER_SITE_OTHER): Payer: Commercial Managed Care - HMO | Admitting: Dermatology

## 2021-12-14 DIAGNOSIS — L400 Psoriasis vulgaris: Secondary | ICD-10-CM | POA: Diagnosis not present

## 2021-12-14 MED ORDER — CLOBETASOL PROPIONATE 0.05 % EX CREA: 1.0000 | TOPICAL_CREAM | Freq: Two times a day (BID) | CUTANEOUS | 0 refills | Status: DC

## 2021-12-21 ENCOUNTER — Telehealth: Payer: Self-pay | Admitting: *Deleted

## 2021-12-21 NOTE — Telephone Encounter (Signed)
Copied from CRM [id number]. Topic: General - Other >> Dec 20, 2021  1:13 PM Janice [name] wrote: Patient requesting a cb from [employer] regarding a contact number for behavioral health she gave her to establish care, patient has misplaced thee number  Patient states her last avs advised her to schedule an appt w/ Franky Macho for a bp check. Patient inquiring if bp check can be done at her next visit on 7-24  Please fu w/ patient

## 2021-12-21 NOTE — Telephone Encounter (Signed)
Spoke with pt today via telephone in regards to her needing resources for ongoing long-term therapy to address her depression. Pt was offered short term services w/[facility]. Pt was given resources below as well as advised to contact her insurance company for agencies in network. [facility] will follow up with pt before her next appt with Dr. Delford Field on 7/24.  [facility] 3.8 (5)  Mental health service [address]  [phone] Open ? Closes 5?PM Medicare accepted Has online care

## 2021-12-26 ENCOUNTER — Ambulatory Visit: Payer: Commercial Managed Care - HMO | Attending: Internal Medicine | Admitting: Internal Medicine

## 2021-12-26 ENCOUNTER — Other Ambulatory Visit: Payer: Self-pay

## 2021-12-26 ENCOUNTER — Encounter: Payer: Self-pay | Admitting: Internal Medicine

## 2021-12-26 VITALS — BP 144/88 | HR 92 | Temp 98.1°F | Ht 60.0 in | Wt 236.2 lb

## 2021-12-26 DIAGNOSIS — M25561 Pain in right knee: Secondary | ICD-10-CM

## 2021-12-26 DIAGNOSIS — F17200 Nicotine dependence, unspecified, uncomplicated: Secondary | ICD-10-CM | POA: Diagnosis not present

## 2021-12-26 DIAGNOSIS — I10 Essential (primary) hypertension: Secondary | ICD-10-CM | POA: Diagnosis not present

## 2021-12-26 DIAGNOSIS — G8929 Other chronic pain: Secondary | ICD-10-CM

## 2021-12-26 DIAGNOSIS — F321 Major depressive disorder, single episode, moderate: Secondary | ICD-10-CM | POA: Diagnosis not present

## 2021-12-26 MED ORDER — SPIRONOLACTONE 25 MG PO TABS
12.5000 mg | ORAL_TABLET | Freq: Every day | ORAL | 6 refills | Status: DC
  Filled 2021-12-26: qty 15, 30d supply, fill #0
  Filled 2022-01-24: qty 15, 30d supply, fill #1
  Filled 2022-03-15: qty 15, 30d supply, fill #2
  Filled 2022-05-19: qty 15, 30d supply, fill #3
  Filled 2022-08-01: qty 15, 30d supply, fill #4

## 2021-12-26 MED ORDER — NICOTINE 21 MG/24HR TD PT24
21.0000 mg | MEDICATED_PATCH | Freq: Every day | TRANSDERMAL | 1 refills | Status: DC
  Filled 2021-12-26 – 2022-01-03 (×2): qty 28, 28d supply, fill #0

## 2021-12-26 NOTE — Patient Instructions (Addendum)
[facility] [address], [place], [place] [address] [phone] or [phone] Walk-in urgent care 24/7 for anyone  For [facility] ONLY New patient assessments and therapy walk-ins: Monday and Wednesday 8am-11am First and second Friday 1pm-5pm New patient psychiatry and medication management walk-ins: Mondays, Wednesdays, Thursdays, Fridays 8am-11am No psychiatry walk-ins on Tuesday   *Accepts all insurance and uninsured for Urgent Care needs *Accepts Medicaid and uninsured for outpatient treatment   [facility] (Therapy and psychiatry) [facility] (near [place]) [address] [place], [place] [address] [phone] Fax: [phone] ([place])   [place] [facility] [place],  [place]  [address] [phone] Call for appointment  [facility] the [place] (Therapy only)  [facility] [address], [place], [place] [address] Monday - Friday: 8:30 a.m.-12 p.m. / 1 p.m.-2:30 p.m.  [facility] [address] [place], [place] [address] Monday-Friday: 8:30 a.m.-12 p.m. / 2-3:30 p.m. (INSURANCE REQUIRED -MEDICAID ACCEPTED) They do offer a sliding fee scale $20-$30/session   [facility] [place], [place] [address] Phone: [place] [address] [place] [address]  Phone: [phone] (Does not accept Medicaid) (only one provider accepts Medicare) [facility] [address] (at [employer]) [place], [place] [phone] (Accepts Medicaid and Medicare)  [facility]  [address]  [place], [place] [address]  Phone: [phone]  [address] [place], [place] [address] Phone: [phone] [facility] Medicaid) [facility] (Therapy only)  [address] [place], [place] [address] Phone: [phone]   [facility] (Therapy only)  [place], [place] [address] Phone: [phone] [facility])   [place] [address] [place], [place] [address] Phone: [phone] ([place]) [employer] [phone] [address] [place], [place] [address] Phone: [phone] [facility]) [facility] (Psychiatry only)  [phone] [address], [place], [place] [address] (Accepts Medicaid and Medicare) [facility] (Psychiatry and therapy)  [place], [place], [place] [address] [phone] [facility] Medicare) [place] (psychiatry and therapy) [address], [place], [place] [address] [phone]  [facility] [address], [place], [place], [place] [address] [phone] Accept [address] [place], [place], [place], [place],  and the following types of Medicaid; [place], [place], [place], [place], [place], [employer], Healthy [place], [place] Complete, and [place], as well as offering a Manufacturing systems engineer and private payment options. Provides In-Office Appointments, Virtual Appointments, and Phone Consultations Offers medication management for ages 46 years old and up, including,  Medication Management for Suboxone and [place] [phone] [address], [place], [place] [address] ([place] Medicaid and Medicare)         19.  [facility] (therapy only)  [address] [place], [place] [address]            [phone] (Accepts medicare) 20. [facility]  (Suboxone and methodone) [phone] [address], [place], [place] [address] To Be Eligible for Opioid Treatment at [facility] you must be at least 57 years of age you have already tried other  interventions that were not successful such as opioid  detox, inpatient rehab for opioids, or outpatient counseling specifically for opioid dependency your [facility] drug test must be completely free of benzodiazepines (klonopin, xanax, valium, ativan, or other benz) you have reliable transportation to the [facility] clinic in [place] you recognize that counseling is a critical component of [facility]' Opioid Program and you agree to attend all required counseling sessions you are committed to total drug abstinence and will conscientiously strive to remain free of alcohol, marijuana, and other illicit substances while in treatment you desire a peaceful treatment atmosphere in which personal responsibility and respect toward staff and clients is the norm   21. [facility] [address] [place], [place], [place] [address] Offers SAIOP (Substance Abuse Intensive Outpatient Program) [phone] 22. [facility] [address] [place], [place] [address] [phone] (Accepts medicare)  For those who are tech savvy, go on psychology today, type in your local city (i.e. [place]. [place]) and specify your insurance at the top of the screen after you search. (Medicaid if needed). You can also specify whether you are interested in therapy and psychiatry.  www.psychologytoday.com/us  The 3 blood pressure medications that you should be on are spironolactone 25 mg half a tablet daily, amlodipine 10 mg daily and propranolol 80 mg daily.

## 2021-12-26 NOTE — Progress Notes (Signed)
Patient ID: Komal Stangelo Mroczkowski, female    DOB: 03/15/1966  MRN: [id number]  CC: Chronic disease management  Subjective: Ilisa Hentges is a 56 y.o. female who presents for chronic disease management Her concerns today include:  Pt with hx of HTN, tob dep, Vit D def, preDM/morbid obesity, MDD, +HPV on pap, GERD, chronic hypokalemia, psoriasis right leg  Depression:  would like to get in with a counselor.  Had seen Ms. McCoy in the past and reports she had referred her for long-term counseling but she misplaced the information.  Spoke to our new LCSW recently Ms. Hall.  She recommended [employer]. -still taking Bupropion Feels the depression about the same. Reports she is smoking more due to stress - up to 1/2 pk a day from 3-4 cig/day -she and son having to move the end of this month and this has been stressful for her.  No SI at this time.  HTN:  taking Norvasc and Propranolol consistently and took already today.  Not taking Spironolactone.  She thought it was discontinued by the neurologist.  The neurologist had discontinued the Topamax not spironolactone. Has a wrist device a few days ago and just started using it.   She limits salt in foods  Saw ortho Dr. Roda Shutters 12/13/21 for RT knee.  He emphasized importance of wgh loss Started on Mobic Also using Voltaren gel Recent x-ray of the knee showed no evidence of arthropathy or focal bone abnormality. Emotional eater.  When she gets depress she over eats  Saw derm for dermatitis RT leg.  Told it is a form of psoriasis Patient Active Problem List   Diagnosis Date Noted   Primary osteoarthritis of right knee 12/13/2021   Morbid obesity (HCC) 12/13/2021   Migraine with aura and without status migrainosus, not intractable 05/31/2021   Severe major depression without psychotic features (HCC) 05/31/2021   Pap smear abnormality of cervix/human papillomavirus (HPV) positive 11/06/2019   Trichomonas infection 11/06/2019    Prediabetes 04/04/2019   Perimenopausal 03/06/2019   Psoriasis 03/06/2019   Vitamin D deficiency 09/29/2015   CHEST PAIN 10/04/2007   OBESITY, NOS 08/02/2006   TOBACCO DEPENDENCE 08/02/2006   Depressive disorder, not elsewhere classified 08/02/2006   HYPERTENSION, BENIGN SYSTEMIC 08/02/2006   Esophageal reflux 08/02/2006     Current Outpatient Medications on File Prior to Visit  Medication Sig Dispense Refill   amLODipine (NORVASC) 10 MG tablet Take 1 tablet (10 mg total) by mouth once nightly at bedtime. 30 tablet 4   aspirin EC 81 MG tablet Take 81 mg by mouth every 4 (four) hours as needed for moderate pain. Swallow whole.     buPROPion (WELLBUTRIN SR) 150 MG 12 hr tablet Take 1 tablet by mouth daily for 1 week, then increase to 1 tablet twice daily. 60 tablet 1   clobetasol cream (TEMOVATE) 0.05 % Apply 1 Application topically 2 (two) times daily. 30 g 0   cyclobenzaprine (FLEXERIL) 10 MG tablet Take 1 tablet (10 mg total) by mouth 2 (two) times daily as needed for muscle spasms. 20 tablet 0   diclofenac Sodium (VOLTAREN) 1 % GEL Apply 2 grams topically 4 (four) times daily. 100 g 1   gadopiclenol (VUEWAY) 0.5 MMOL/ML solution Inject 10 mLs into the vein once as needed for up to 1 dose for contrast. 10 mL 0   meloxicam (MOBIC) 7.5 MG tablet Take 1 tablet (7.5 mg total) by mouth 2 (two) times daily as needed for pain. 30 tablet  2   propranolol ER (INDERAL LA) 80 MG 24 hr capsule Take 1 capsule (80 mg total) by mouth daily. 30 capsule 5   tazarotene (TAZORAC) 0.05 % gel Apply topically at bedtime. 30 g 0   triamcinolone cream (KENALOG) 0.1 % Apply 1 application topically 2 (two) times daily. 30 g 2   No current facility-administered medications on file prior to visit.    Allergies  Allergen Reactions   Losartan     Chest pain   Neomycin Sulfate Swelling, Rash and Other (See Comments)    Pain in ear, swelling and redness    Social History   Socioeconomic History   Marital  status: Single    Spouse name: Not on file   Number of children: 3   Years of education: Not on file   Highest education level: Not on file  Occupational History   Not on file  Tobacco Use   Smoking status: Every Day    Packs/day: 0.25    Years: 40.00    Total pack years: 10.00    Types: Cigarettes   Smokeless tobacco: Never   Tobacco comments:    Wants to quit in 2021  Vaping Use   Vaping Use: Never used  Substance and Sexual Activity   Alcohol use: Yes    Comment: occ, 2 drinks   Drug use: No   Sexual activity: Yes    Birth control/protection: Condom  Other Topics Concern   Not on file  Social History Narrative   Not on file   Social Determinants of Health   Financial Resource Strain: Not on file  Food Insecurity: Not on file  Transportation Needs: Not on file  Physical Activity: Insufficiently Active (03/06/2019)   Exercise Vital Sign    Days of Exercise per Week: 1 day    Minutes of Exercise per Session: 30 min  Stress: No Stress Concern Present (03/06/2019)   [employer] of Occupational Health - Occupational Stress Questionnaire    Feeling of Stress : Only a little  Social Connections: Not on file  Intimate Partner Violence: Unknown (03/06/2019)   Humiliation, Afraid, Rape, and Kick questionnaire    Fear of Current or Ex-Partner: Patient refused    Emotionally Abused: Patient refused    Physically Abused: Patient refused    Sexually Abused: Patient refused    Family History  Problem Relation Age of Onset   Hypertension Mother    Kidney disease Mother    Alcohol abuse Father    Depression Father    Drug abuse Father    Hypertension Father    Hypertension Maternal Grandmother    Stroke Maternal Grandmother    Alzheimer's disease Maternal Grandfather    Hypertension Daughter     Past Surgical History:  Procedure Laterality Date   CESAREAN SECTION     x2    ROS: Review of Systems Negative except as stated above  PHYSICAL EXAM: BP (!)  144/88   Pulse 92   Temp 98.1 F (36.7 C) (Oral)   Ht 5' (1.524 m)   Wt 236 lb 3.2 oz (107.1 kg)   SpO2 94%   BMI 46.13 kg/m   Wt Readings from Last 3 Encounters:  12/26/21 236 lb 3.2 oz (107.1 kg)  10/18/21 231 lb (104.8 kg)  07/14/21 228 lb 12.8 oz (103.8 kg)    Physical Exam  General appearance - alert, well appearing, and in no distress Mental status - normal mood, behavior, speech, dress, motor activity, and thought processes  Neck - supple, no significant adenopathy Chest - clear to auscultation, no wheezes, rales or rhonchi, symmetric air entry Heart - normal rate, regular rhythm, normal S1, S2, no murmurs, rubs, clicks or gallops Extremities - peripheral pulses normal, no pedal edema, no clubbing or cyanosis Skin -hypopigmented rough raised rash on the lateral aspect of the right lower leg      12/26/2021    5:23 PM 10/18/2021    5:16 PM 07/06/2021    4:28 PM  Depression screen PHQ 2/9  Decreased Interest 2 2 3   Down, Depressed, Hopeless 3 3 3   PHQ - 2 Score 5 5 6   Altered sleeping 3  3  Tired, decreased energy 3 3 3   Change in appetite 3 2 3   Feeling bad or failure about yourself  3 3 3   Trouble concentrating 3 3 3   Moving slowly or fidgety/restless 1 0 3  Suicidal thoughts 0 0 3  PHQ-9 Score 21  27       Latest Ref Rng & Units 05/31/2021    3:44 PM 04/14/2021   10:35 PM 04/08/2021    6:00 AM  CMP  Glucose 70 - 99 mg/dL 95     BUN 6 - 24 mg/dL 18  22  22    Creatinine 0.57 - 1.00 mg/dL   06/02/2021   Sodium 134 - 144 mmol/L 143  134  136   Potassium 3.5 - 5.2 mmol/L 3.6  2.6  2.7   Chloride 96 - 106 mmol/L 100  94  96   CO2 20 - 29 mmol/L 31  31  29    Calcium 8.7 - 10.2 mg/dL 9.1  8.9  9.0   Total Protein 6.5 - 8.1 g/dL  7.8    Total Bilirubin 0.3 - 1.2 mg/dL  0.8    Alkaline Phos 38 - 126 U/L  98    AST 15 - 41 U/L  25    ALT 0 - 44 U/L  27     Lipid Panel     Component Value Date/Time   CHOL 162 05/31/2021 1544   TRIG 91 05/31/2021  1544   HDL 43 05/31/2021 1544   CHOLHDL 3.8 05/31/2021 1544   CHOLHDL 3.7 08/24/2016 1634   VLDL 22 08/24/2016 1634   LDLCALC 102 (H) 05/31/2021 1544    CBC    Component Value Date/Time   WBC 10.9 (H) 04/14/2021 2235   RBC 5.89 (H) 04/14/2021 2235   HGB 14.0 04/14/2021 2235   HGB 13.5 03/06/2019 1344   HCT 45.0 04/14/2021 2235   HCT 44.3 03/06/2019 1344   PLT 335 04/14/2021 2235   PLT 308 03/06/2019 1344   MCV 76.4 (L) 04/14/2021 2235   MCV 77 (L) 03/06/2019 1344   MCH 23.8 (L) 04/14/2021 2235   MCHC 31.1 04/14/2021 2235   RDW 13.9 04/14/2021 2235   RDW 14.4 03/06/2019 1344   LYMPHSABS 2.7 04/14/2021 2235   LYMPHSABS 2.2 03/06/2019 1344   MONOABS 0.5 04/14/2021 2235   EOSABS 0.1 04/14/2021 2235   EOSABS 0.2 03/06/2019 1344   BASOSABS 0.0 04/14/2021 2235   BASOSABS 0.1 03/06/2019 1344    ASSESSMENT AND PLAN:  1. Essential hypertension Not at goal.  Continue Norvasc and propranolol.  Restart spironolactone. Follow-up with clinical pharmacist in 2 weeks for repeat blood pressure check. - spironolactone (ALDACTONE) 25 MG tablet; Take 0.5 tablets (12.5 mg total) by mouth daily.  Dispense: 30 tablet; Refill: 6  2. Moderate major depression (  HCC) Ms. Margo Aye was not available today to see the patient.  However I printed information on her discharge summary about mental health providers in the [place] area.  Recommend that she calls a few of them and try to get in with one of them for counseling and medication management.  She expressed understanding and is agreeable to this.  3. Tobacco dependence Advised to quit.  She is upset that she is smoking more.  She wants to quit.  Discussed trying the nicotine patches and she is agreeable to this.  Went over with her the stepdown process of the nicotine patches. - nicotine (NICODERM CQ - DOSED IN MG/24 HOURS) 21 mg/24hr patch; Place 1 patch (21 mg total) onto the skin daily.  Dispense: 28 patch; Refill: 1  4. Morbid obesity  (HCC) Patient advised to eliminate sugary drinks from the diet, cut back on portion sizes especially of white carbohydrates, eat more white lean meat like chicken [place] and seafood instead of beef or pork and incorporate fresh fruits and vegetables into the diet daily. Encouraged her to move as much as she can. She is agreeable to seeing the nutritionist. - Amb ref to Medical Nutrition Therapy-MNT  5. Chronic pain of right knee Discussed the importance of weight loss to help take mechanical strain off the knees.  Continue meloxicam.    Patient was given the opportunity to ask questions.  Patient verbalized understanding of the plan and was able to repeat key elements of the plan.   This documentation was completed using Paediatric nurse.  Any transcriptional errors are unintentional.  Orders Placed This Encounter  Procedures   Amb ref to Medical Nutrition Therapy-MNT     Requested Prescriptions   Signed Prescriptions Disp Refills   nicotine (NICODERM CQ - DOSED IN MG/24 HOURS) 21 mg/24hr patch 28 patch 1    Sig: Place 1 patch (21 mg total) onto the skin daily.   spironolactone (ALDACTONE) 25 MG tablet 30 tablet 6    Sig: Take 0.5 tablets (12.5 mg total) by mouth daily.    Return in about 4 months (around 04/28/2022) for Appt with [facility] in 2 wks for BP check.  Jonah Blue, MD, FACP

## 2021-12-26 NOTE — Progress Notes (Signed)
Pt state that she wants discuss [facility] referral

## 2021-12-27 ENCOUNTER — Other Ambulatory Visit: Payer: Self-pay

## 2021-12-27 NOTE — Progress Notes (Deleted)
NEUROLOGY FOLLOW UP OFFICE NOTE  Janice [name] [id number]  Assessment/Plan:   New onset headaches - denies prior history of headaches.  May be related to uncontrolled hypertension.  Also consider increased intracranial pressure or triggered by emotional stress.  Currently complicated by medication-overuse Uncontrolled hypertension Blurred vision Memory deficits - precedes starting topiramate     Headache prevention:  *** Repeat MRI of brain without contrast in one year to follow up on prior imaging findings. ***       Subjective:  Janice [name] is a 56 year old female with morbid obesity and HTN who follows up for headache.  UPDATE: Due to memory concerns, switched from topiramate to propranolol.  Advised to discontinue Fioricet. Intensity:  *** Duration:  *** Frequency:  *** Frequency of abortive medication: ***  MRI of brain with and without contrast on 10/13/2021 personally reviewed showed mild to moderate chronic small vessel ischemic changes but also abnormal signal along the splenium of the corpus callosum consistent with gliosis.    She was advised to follow up with ophthalmology.  ***  Current NSAIDS/analgesics:  ASA 81mg  daily, FIoricet Current triptans:  none Current ergotamine:  none Current anti-emetic:  none Current muscle relaxants:  none Current Antihypertensive medications:  propranolol ER 80mg  daily, amlodipine Current Antidepressant medications:  Wellubtrin Current Anticonvulsant medications:  none Current anti-CGRP:  none Current Vitamins/Herbal/Supplements:  none Current Antihistamines/Decongestants:  none Other therapy:  none Hormone/birth control:  none  HISTORY:  She began experiencing persistent headache and visual disturbance in October.  She described a 9/10 throbbing frontal/temporal headache with photophobia and some blurred vision, confusion, slurred speech, and off balance.  No nausea, vomiting, double vision, visual obscurations,  pulsatile tinnitus, numbness ane weakness.  Would wake her up out of her sleep.  She was seen in the ED on 3 occasions between 04/04/2021 and 04/14/2021.  Systolic blood pressure had been from 159 to 200s.  CT head on 04/04/2021 personally reviewed showed chronic small vessel ischemic changes.  Follow up MRV of head personally reviewed was negative for dural venous thrombosis or stenosis.  They started subsiding in December after she was started on daily topiramate and Fioricet.  They are now 3-4/10 lasting 15-20 minutes occurring every 3 to 4 days (treats with ASA or ibuprofen daily).  She started taking Fioricet almost daily.  Still reports memory problems - gets lost driving on familiar routes or forgetting to take her medications.  Again, this started in October.  Still with blurred vision.  Has an upcoming appointment with ophthalmology.  Blood pressure has still been uncontrolled.  No prior history of headaches.  Just prior to onset of headaches, the father of her children passed away which caused a great deal of emotional distress.      Past NSAIDS/analgesics:  naproxen Past abortive triptans:  none Past abortive ergotamine:  none Past muscle relaxants:  tizanidine Past anti-emetic:  none Past antihypertensive medications:  lisinopril, HCTZ, losartan Past antidepressant medications:  none Past anticonvulsant medications:  topiramate Past anti-CGRP:  none Past vitamins/Herbal/Supplements:  none Past antihistamines/decongestants:  none Other past therapies:  none  PAST MEDICAL HISTORY: Past Medical History:  Diagnosis Date   Depression    history of   GERD (gastroesophageal reflux disease)    acid reflux at night and with spicy foods   Hypertension    Morbid obesity with BMI of 40.0-44.9, adult [facility])     MEDICATIONS: Current Outpatient Medications on File Prior to Visit  Medication Sig  Dispense Refill   amLODipine (NORVASC) 10 MG tablet Take 1 tablet (10 mg total) by mouth once  nightly at bedtime. 30 tablet 4   aspirin EC 81 MG tablet Take 81 mg by mouth every 4 (four) hours as needed for moderate pain. Swallow whole.     buPROPion (WELLBUTRIN SR) 150 MG 12 hr tablet Take 1 tablet by mouth daily for 1 week, then increase to 1 tablet twice daily. 60 tablet 1   clobetasol cream (TEMOVATE) 0.05 % Apply 1 Application topically 2 (two) times daily. 30 g 0   cyclobenzaprine (FLEXERIL) 10 MG tablet Take 1 tablet (10 mg total) by mouth 2 (two) times daily as needed for muscle spasms. 20 tablet 0   diclofenac Sodium (VOLTAREN) 1 % GEL Apply 2 grams topically 4 (four) times daily. 100 g 1   gadopiclenol (VUEWAY) 0.5 MMOL/ML solution Inject 10 mLs into the vein once as needed for up to 1 dose for contrast. 10 mL 0   meloxicam (MOBIC) 7.5 MG tablet Take 1 tablet (7.5 mg total) by mouth 2 (two) times daily as needed for pain. 30 tablet 2   nicotine (NICODERM CQ - DOSED IN MG/24 HOURS) 21 mg/24hr patch Place 1 patch (21 mg total) onto the skin daily. 28 patch 1   propranolol ER (INDERAL LA) 80 MG 24 hr capsule Take 1 capsule (80 mg total) by mouth daily. 30 capsule 5   spironolactone (ALDACTONE) 25 MG tablet Take 0.5 tablets (12.5 mg total) by mouth daily. 30 tablet 6   tazarotene (TAZORAC) 0.05 % gel Apply topically at bedtime. 30 g 0   triamcinolone cream (KENALOG) 0.1 % Apply 1 application topically 2 (two) times daily. 30 g 2   No current facility-administered medications on file prior to visit.    ALLERGIES: Allergies  Allergen Reactions   Losartan     Chest pain   Neomycin Sulfate Swelling, Rash and Other (See Comments)    Pain in ear, swelling and redness    FAMILY HISTORY: Family History  Problem Relation Age of Onset   Hypertension Mother    Kidney disease Mother    Alcohol abuse Father    Depression Father    Drug abuse Father    Hypertension Father    Hypertension Maternal Grandmother    Stroke Maternal Grandmother    Alzheimer's disease Maternal  Grandfather    Hypertension Daughter       Objective:  *** General: No acute distress.  Patient appears well-groomed.   Head:  Normocephalic/atraumatic Eyes:  Fundi examined but not visualized Neck: supple, no paraspinal tenderness, full range of motion Heart:  Regular rate and rhythm Neurological Exam: alert and oriented to person, place, and time.  Speech fluent and not dysarthric, language intact.  CN II-XII intact. Bulk and tone normal, muscle strength 5/5 throughout.  Sensation to light touch intact.  Deep tendon reflexes 2+ throughout, toes downgoing.  Finger to nose testing intact.  Gait normal, Romberg negative.   Shon Millet, DO  CC: Jonah Blue, MD

## 2021-12-28 ENCOUNTER — Ambulatory Visit: Payer: Commercial Managed Care - HMO | Admitting: Neurology

## 2021-12-28 ENCOUNTER — Telehealth: Payer: Self-pay | Admitting: Neurology

## 2021-12-28 NOTE — Telephone Encounter (Signed)
LMOVM MRI results advised over the phone on 10/17/21.  This may have been just a follow up from her visit to see how she is doing on her medication written on 07/14/21.

## 2021-12-28 NOTE — Telephone Encounter (Signed)
Pt came in and was late for her appointment today. She stated she was supposed to find out the results of her MRI she had done on 10/13/21. She would like for someone to call her with the results.

## 2021-12-30 NOTE — Telephone Encounter (Signed)
LMOVM for patient please give the office a call.

## 2022-01-02 NOTE — Progress Notes (Unsigned)
NEUROLOGY FOLLOW UP OFFICE NOTE  Janice [name] [id number]  Assessment/Plan:   New onset headaches - denies prior history of headaches.  May be related to uncontrolled hypertension.  Also consider increased intracranial pressure or triggered by emotional stress.  Currently complicated by medication-overuse Uncontrolled hypertension Blurred vision Memory deficits - precedes starting topiramate     1  As topiramate may contribute to memory deficits, discontinue.  Instead, start propranolol ER 80mg  daily.  Given uncontrolled blood pressure and already on an antidepressant, would defer starting a TCA such as nortriptyline.   2  Stop Fioricet due to high propensity for rebound headache.  May use OTC analgesics but instructed to limit use of pain relievers to no more than 2 days out of week to prevent risk of rebound or medication-overuse headache. 3  Must aggressively optimize blood pressure control.  Follow up with PCP. 4  Important to follow up with ophthalmology to evaluate for papilledema. 5  Check MRI of brain with and without contrast 6  Pending results, may need to consider LP to assess opening pressure and CSF analysis. 7  Follow up 3 to 4 months.       Subjective:  Janice [name] is a 55 year old female with morbid obesity and HTN who follows up for headache.  UPDATE: MRI of brain with and without contrast on 10/13/2021 personally reviewed showed mild-moderate chronic small vessel ischemic changes including gliosis along the splenium of the corpus callosum.  Due to memory problems, topiramate was changed to propranolol.  *** Intensity:  *** Duration:  *** Frequency:  *** Frequency of abortive medication: *** Current NSAIDS/analgesics:  ASA 81mg  daily Current triptans:  none Current ergotamine:  none Current anti-emetic:  none Current muscle relaxants:  none Current Antihypertensive medications:  propranolol ER 80mg  daily, amlodipine Current Antidepressant  medications:  Wellubtrin Current Anticonvulsant medications:  none Current anti-CGRP:  none Current Vitamins/Herbal/Supplements:  none Current Antihistamines/Decongestants:  none Other therapy:  none Hormone/birth control:  none  HISTORY:  She began experiencing persistent headache and visual disturbance in October.  She described a 9/10 throbbing frontal/temporal headache with photophobia and some blurred vision, confusion, slurred speech, and off balance.  No nausea, vomiting, double vision, visual obscurations, pulsatile tinnitus, numbness ane weakness.  Would wake her up out of her sleep.  She was seen in the ED on 3 occasions between 04/04/2021 and 04/14/2021.  Systolic blood pressure had been from 159 to 200s.  CT head on 04/04/2021 personally reviewed showed chronic small vessel ischemic changes.  Follow up MRV of head personally reviewed was negative for dural venous thrombosis or stenosis.  They started subsiding in December after she was started on daily topiramate and Fioricet.  They are now 3-4/10 lasting 15-20 minutes occurring every 3 to 4 days (treats with ASA or ibuprofen daily).  She started taking Fioricet almost daily.  Still reports memory problems - gets lost driving on familiar routes or forgetting to take her medications.  Again, this started in October.  Still with blurred vision.  Has an upcoming appointment with ophthalmology.  Blood pressure has still been uncontrolled.  No prior history of headaches.  Just prior to onset of headaches, the father of her children passed away which caused a great deal of emotional distress.      Past NSAIDS/analgesics:  naproxen, Fioricet Past abortive triptans:  none Past abortive ergotamine:  none Past muscle relaxants:  tizanidine Past anti-emetic:  none Past antihypertensive medications:  lisinopril, HCTZ, losartan  Past antidepressant medications:  none Past anticonvulsant medications:  topiramate (cognitive changes) Past anti-CGRP:   none Past vitamins/Herbal/Supplements:  none Past antihistamines/decongestants:  none Other past therapies:  none    PAST MEDICAL HISTORY: Past Medical History:  Diagnosis Date   Depression    history of   GERD (gastroesophageal reflux disease)    acid reflux at night and with spicy foods   Hypertension    Morbid obesity with BMI of 40.0-44.9, adult (HCC)     MEDICATIONS: Current Outpatient Medications on File Prior to Visit  Medication Sig Dispense Refill   amLODipine (NORVASC) 10 MG tablet Take 1 tablet (10 mg total) by mouth once nightly at bedtime. 30 tablet 4   aspirin EC 81 MG tablet Take 81 mg by mouth every 4 (four) hours as needed for moderate pain. Swallow whole.     buPROPion (WELLBUTRIN SR) 150 MG 12 hr tablet Take 1 tablet by mouth daily for 1 week, then increase to 1 tablet twice daily. 60 tablet 1   clobetasol cream (TEMOVATE) 0.05 % Apply 1 Application topically 2 (two) times daily. 30 g 0   cyclobenzaprine (FLEXERIL) 10 MG tablet Take 1 tablet (10 mg total) by mouth 2 (two) times daily as needed for muscle spasms. 20 tablet 0   diclofenac Sodium (VOLTAREN) 1 % GEL Apply 2 grams topically 4 (four) times daily. 100 g 1   gadopiclenol (VUEWAY) 0.5 MMOL/ML solution Inject 10 mLs into the vein once as needed for up to 1 dose for contrast. 10 mL 0   meloxicam (MOBIC) 7.5 MG tablet Take 1 tablet (7.5 mg total) by mouth 2 (two) times daily as needed for pain. 30 tablet 2   nicotine (NICODERM CQ - DOSED IN MG/24 HOURS) 21 mg/24hr patch Place 1 patch (21 mg total) onto the skin daily. 28 patch 1   propranolol ER (INDERAL LA) 80 MG 24 hr capsule Take 1 capsule (80 mg total) by mouth daily. 30 capsule 5   spironolactone (ALDACTONE) 25 MG tablet Take 0.5 tablets (12.5 mg total) by mouth daily. 30 tablet 6   tazarotene (TAZORAC) 0.05 % gel Apply topically at bedtime. 30 g 0   triamcinolone cream (KENALOG) 0.1 % Apply 1 application topically 2 (two) times daily. 30 g 2   No  current facility-administered medications on file prior to visit.    ALLERGIES: Allergies  Allergen Reactions   Losartan     Chest pain   Neomycin Sulfate Swelling, Rash and Other (See Comments)    Pain in ear, swelling and redness    FAMILY HISTORY: Family History  Problem Relation Age of Onset   Hypertension Mother    Kidney disease Mother    Alcohol abuse Father    Depression Father    Drug abuse Father    Hypertension Father    Hypertension Maternal Grandmother    Stroke Maternal Grandmother    Alzheimer's disease Maternal Grandfather    Hypertension Daughter       Objective:  *** General: No acute distress.  Patient appears ***-groomed.   Head:  Normocephalic/atraumatic Eyes:  Fundi examined but not visualized Neck: supple, no paraspinal tenderness, full range of motion Heart:  Regular rate and rhythm Lungs:  Clear to auscultation bilaterally Back: No paraspinal tenderness Neurological Exam: alert and oriented to person, place, and time.  Speech fluent and not dysarthric, language intact.  CN II-XII intact. Bulk and tone normal, muscle strength 5/5 throughout.  Sensation to light touch intact.  Deep  tendon reflexes 2+ throughout, toes downgoing.  Finger to nose testing intact.  Gait normal, Romberg negative.   Shon Millet, DO  CC: ***

## 2022-01-03 ENCOUNTER — Other Ambulatory Visit: Payer: Self-pay

## 2022-01-03 ENCOUNTER — Encounter: Payer: Self-pay | Admitting: Neurology

## 2022-01-03 ENCOUNTER — Ambulatory Visit (INDEPENDENT_AMBULATORY_CARE_PROVIDER_SITE_OTHER): Payer: Commercial Managed Care - HMO | Admitting: Neurology

## 2022-01-03 ENCOUNTER — Other Ambulatory Visit (INDEPENDENT_AMBULATORY_CARE_PROVIDER_SITE_OTHER): Payer: Commercial Managed Care - HMO

## 2022-01-03 VITALS — BP 143/89 | HR 72 | Resp 20 | Ht 60.0 in | Wt 235.0 lb

## 2022-01-03 DIAGNOSIS — H538 Other visual disturbances: Secondary | ICD-10-CM | POA: Diagnosis not present

## 2022-01-03 DIAGNOSIS — R413 Other amnesia: Secondary | ICD-10-CM

## 2022-01-03 DIAGNOSIS — F32A Depression, unspecified: Secondary | ICD-10-CM

## 2022-01-03 DIAGNOSIS — R519 Headache, unspecified: Secondary | ICD-10-CM

## 2022-01-03 DIAGNOSIS — F419 Anxiety disorder, unspecified: Secondary | ICD-10-CM

## 2022-01-03 MED ORDER — PROPRANOLOL HCL ER 80 MG PO CP24
80.0000 mg | ORAL_CAPSULE | Freq: Every day | ORAL | 5 refills | Status: DC
Start: 2022-01-03 — End: 2022-08-29
  Filled 2022-01-03: qty 30, 30d supply, fill #0
  Filled 2022-03-15: qty 30, 30d supply, fill #1

## 2022-01-03 NOTE — Patient Instructions (Signed)
Continue propranolol - refilled Check B12 and TSH Follow up 6 months.

## 2022-01-04 LAB — TSH: TSH: 0.96 u[IU]/mL (ref 0.35–5.50)

## 2022-01-04 LAB — VITAMIN B12: Vitamin B-12: 632 pg/mL (ref 211–911)

## 2022-01-06 ENCOUNTER — Encounter: Payer: Self-pay | Admitting: Dermatology

## 2022-01-06 NOTE — Progress Notes (Signed)
   New Patient   Subjective  Janice [name] is a 56 y.o. female who presents for the following: New Patient (Initial Visit) (Patient has rash or possible psoriasis on right lower leg. X 3 years. It does itch. Patient got triamcinolone cream from pcp. It did not help her. She did have some on her elbows but they got better. ).  Itchy rash particularly on legs.  No benefit from topical triamcinolone on legs although the arms did improve. Location:  Duration:  Quality:  Associated Signs/Symptoms: Modifying Factors:  Severity:  Timing: Context:    The following portions of the chart were reviewed this encounter and updated as appropriate:  Tobacco  Allergies  Meds  Problems  Med Hx  Surg Hx  Fam Hx      Objective  Well appearing patient in no apparent distress; mood and affect are within normal limits. Right Lower Leg - Anterior Large hyperpigmentation psoriatic plaques most severe on legs.  Extended discussion both about the cause of psoriasis along with essentially all treatment options.  Strongly encourage achieving optimal lifestyle, particularly the importance of cessation of tobacco products..       A focused examination was performed including head, neck, arms, legs, joints.. Relevant physical exam findings are noted in the Assessment and Plan.   Assessment & Plan  Psoriasis vulgaris Right Lower Leg - Anterior  I will initiate therapy with topical clobetasol; option of covering the cream with a warm moist wrap.  Initial follow-up by by MyChart or phone 1 month.  Told that the postinflammatory hyperpigmentation will probably persist.  clobetasol cream (TEMOVATE) 0.05 % - Right Lower Leg - Anterior Apply 1 Application topically 2 (two) times daily.

## 2022-01-17 ENCOUNTER — Ambulatory Visit (INDEPENDENT_AMBULATORY_CARE_PROVIDER_SITE_OTHER): Payer: Commercial Managed Care - HMO | Admitting: Orthopaedic Surgery

## 2022-01-17 ENCOUNTER — Encounter: Payer: Self-pay | Admitting: Orthopaedic Surgery

## 2022-01-17 DIAGNOSIS — M1711 Unilateral primary osteoarthritis, right knee: Secondary | ICD-10-CM

## 2022-01-17 MED ORDER — LIDOCAINE HCL 1 % IJ SOLN
2.0000 mL | INTRAMUSCULAR | Status: AC | PRN
  Administered 2022-01-17: 2 mL

## 2022-01-17 MED ORDER — METHYLPREDNISOLONE ACETATE 40 MG/ML IJ SUSP
40.0000 mg | INTRAMUSCULAR | Status: AC | PRN
  Administered 2022-01-17: 40 mg via INTRA_ARTICULAR

## 2022-01-17 MED ORDER — BUPIVACAINE HCL 0.5 % IJ SOLN
2.0000 mL | INTRAMUSCULAR | Status: AC | PRN
  Administered 2022-01-17: 2 mL via INTRA_ARTICULAR

## 2022-01-17 NOTE — Progress Notes (Signed)
Office Visit Note   Patient: Janice [name]           Date of Birth: Jun 02, 1966           MRN: [id number] Visit Date: 01/17/2022              Requested by: Marcine Matar, MD [address] [place],  [place] [address] PCP: Marcine Matar, MD   Assessment & Plan: Visit Diagnoses:  1. Primary osteoarthritis of right knee     Plan: Impression chronic right knee pain, OA possible psoriatic arthritis.  Right knee aspiration and injection performed today.  Will send fluid to lab.  12 cc obtained.    Follow-Up Instructions: No follow-ups on file.   Orders:  No orders of the defined types were placed in this encounter.  No orders of the defined types were placed in this encounter.     Procedures: Large Joint Inj: R knee on 01/17/2022 3:53 PM Indications: pain Details: 22 G needle  Arthrogram: No  Medications: 40 mg methylPREDNISolone acetate 40 MG/ML; 2 mL lidocaine 1 %; 2 mL bupivacaine 0.5 % Consent was given by the patient. Patient was prepped and draped in the usual sterile fashion.       Clinical Data: No additional findings.   Subjective: Chief Complaint  Patient presents with   Right Knee - Pain    HPI Patient returns today for chronic right knee pain.  Mobic was ineffective.    Review of Systems   Objective: Vital Signs: There were no vitals taken for this visit.  Physical Exam  Ortho Exam Has psoriasis like plaque on lateral calf Specialty Comments:  No specialty comments available.  Imaging: No results found.   PMFS History: Patient Active Problem List   Diagnosis Date Noted   Primary osteoarthritis of right knee 12/13/2021   Morbid obesity (HCC) 12/13/2021   Migraine with aura and without status migrainosus, not intractable 05/31/2021   Severe major depression without psychotic features (HCC) 05/31/2021   Pap smear abnormality of cervix/human papillomavirus (HPV) positive 11/06/2019   Trichomonas infection 11/06/2019    Prediabetes 04/04/2019   Perimenopausal 03/06/2019   Psoriasis 03/06/2019   Vitamin D deficiency 09/29/2015   CHEST PAIN 10/04/2007   OBESITY, NOS 08/02/2006   TOBACCO DEPENDENCE 08/02/2006   Depressive disorder, not elsewhere classified 08/02/2006   HYPERTENSION, BENIGN SYSTEMIC 08/02/2006   Esophageal reflux 08/02/2006   Past Medical History:  Diagnosis Date   Depression    history of   GERD (gastroesophageal reflux disease)    acid reflux at night and with spicy foods   Hypertension    Morbid obesity with BMI of 40.0-44.9, adult (HCC)     Family History  Problem Relation Age of Onset   Hypertension Mother    Kidney disease Mother    Alcohol abuse Father    Depression Father    Drug abuse Father    Hypertension Father    Hypertension Maternal Grandmother    Stroke Maternal Grandmother    Alzheimer's disease Maternal Grandfather    Hypertension Daughter     Past Surgical History:  Procedure Laterality Date   CESAREAN SECTION     x2   Social History   Occupational History   Not on file  Tobacco Use   Smoking status: Every Day    Packs/day: 0.25    Years: 40.00    Total pack years: 10.00    Types: Cigarettes   Smokeless tobacco: Never  Tobacco comments:    Wants to quit in 2021  Vaping Use   Vaping Use: Never used  Substance and Sexual Activity   Alcohol use: Yes    Comment: occ, 2 drinks   Drug use: No   Sexual activity: Yes    Birth control/protection: Condom

## 2022-01-18 LAB — SYNOVIAL FLUID ANALYSIS, COMPLETE
Basophils, %: 0 %
Eosinophils-Synovial: 0 % (ref 0–2)
Lymphocytes-Synovial Fld: 35 % (ref 0–74)
Monocyte/Macrophage: 20 % (ref 0–69)
Neutrophil, Synovial: 45 % — ABNORMAL HIGH (ref 0–24)
Synoviocytes, %: 0 % (ref 0–15)
WBC, Synovial: 446 cells/uL — ABNORMAL HIGH (ref <150)

## 2022-01-24 ENCOUNTER — Other Ambulatory Visit: Payer: Self-pay | Admitting: Internal Medicine

## 2022-01-24 ENCOUNTER — Other Ambulatory Visit: Payer: Self-pay

## 2022-01-24 DIAGNOSIS — F322 Major depressive disorder, single episode, severe without psychotic features: Secondary | ICD-10-CM

## 2022-01-25 ENCOUNTER — Other Ambulatory Visit: Payer: Self-pay

## 2022-01-25 MED ORDER — BUPROPION HCL ER (SR) 150 MG PO TB12
150.0000 mg | ORAL_TABLET | Freq: Two times a day (BID) | ORAL | 2 refills | Status: DC
  Filled 2022-01-25: qty 60, 30d supply, fill #0
  Filled 2022-03-15: qty 60, 30d supply, fill #1
  Filled 2022-05-19: qty 60, 30d supply, fill #2

## 2022-01-26 ENCOUNTER — Other Ambulatory Visit: Payer: Self-pay

## 2022-01-31 ENCOUNTER — Ambulatory Visit: Payer: Commercial Managed Care - HMO | Admitting: Pharmacist

## 2022-02-14 ENCOUNTER — Other Ambulatory Visit: Payer: Self-pay | Admitting: Chiropractic Medicine

## 2022-02-14 DIAGNOSIS — M47816 Spondylosis without myelopathy or radiculopathy, lumbar region: Secondary | ICD-10-CM

## 2022-02-14 DIAGNOSIS — M545 Low back pain, unspecified: Secondary | ICD-10-CM

## 2022-02-23 ENCOUNTER — Ambulatory Visit: Payer: Self-pay | Admitting: *Deleted

## 2022-02-23 NOTE — Telephone Encounter (Signed)
  Chief Complaint: Feet Swelling Symptoms: Both feet swollen on tops and sides only. Painful 8/10 to touch , severe with walking, cannot get shoes on. Frequency: 1 week Pertinent Negatives: Patient denies redness, warmth Disposition: [] ED /[x] Urgent Care (no appt availability in office) / [] Appointment(In office/virtual)/ []  [place] [facility]/ [] Home Care/ [] Refused Recommended Disposition /[] [place] [facility]/ []  Follow-up with PCP No availability. HAs been elevating and soaking feet. Discussed [facility]. Pt states has gone to walk in clinic and would prefer to do that.  Reason for Disposition  [1] MODERATE leg swelling (e.g., swelling extends up to knees) AND [2] new-onset or worsening    Both feet swelling  Answer Assessment - Initial Assessment Questions 1. ONSET: "When did the swelling start?" (e.g., minutes, hours, days)     1 week 2. LOCATION: "What part of the leg is swollen?"  "Are both legs swollen or just one leg?"     Both feet on tops and sides 3. SEVERITY: "How bad is the swelling?" (e.g., localized; mild, moderate, severe)   - Localized: Small area of swelling localized to one leg.   - MILD pedal edema: Swelling limited to foot and ankle, pitting edema < 1/4 inch (6 mm) deep, rest and elevation eliminate most or all swelling.   - MODERATE edema: Swelling of lower leg to knee, pitting edema > 1/4 inch (6 mm) deep, rest and elevation only partially reduce swelling.   - SEVERE edema: Swelling extends above knee, facial or hand swelling present.      Moderate feet only 4. REDNESS: "Does the swelling look red or infected?"     no 5. PAIN: "Is the swelling painful to touch?" If Yes, ask: "How painful is it?"   (Scale 1-10; mild, moderate or severe)     Pain with movement, 8/10 6. FEVER: "Do you have a fever?" If Yes, ask: "What is it, how was it measured, and when did it start?"      no 7. CAUSE: "What do you think is causing the leg swelling?"      8. MEDICAL  HISTORY: "Do you have a history of blood clots (e.g., DVT), cancer, heart failure, kidney disease, or liver failure?"      9. RECURRENT SYMPTOM: "Have you had leg swelling before?" If Yes, ask: "When was the last time?" "What happened that time?"      10. OTHER SYMPTOMS: "Do you have any other symptoms?" (e.g., chest pain, difficulty breathing)       No  Protocols used: Leg Swelling and Edema-A-AH

## 2022-02-23 NOTE — Telephone Encounter (Signed)
Pt was called and she states that she will go to urgent care for her swelling.

## 2022-03-02 ENCOUNTER — Ambulatory Visit
Admission: RE | Admit: 2022-03-02 | Discharge: 2022-03-02 | Disposition: A | Discharge status: Home / Self Care | Payer: Self-pay | Source: Ambulatory Visit | Attending: Chiropractic Medicine | Admitting: Chiropractic Medicine

## 2022-03-02 DIAGNOSIS — M47816 Spondylosis without myelopathy or radiculopathy, lumbar region: Secondary | ICD-10-CM

## 2022-03-02 DIAGNOSIS — M545 Low back pain, unspecified: Secondary | ICD-10-CM

## 2022-03-06 ENCOUNTER — Ambulatory Visit: Payer: Commercial Managed Care - HMO | Admitting: Registered"

## 2022-03-15 ENCOUNTER — Other Ambulatory Visit: Payer: Self-pay

## 2022-03-16 ENCOUNTER — Other Ambulatory Visit: Payer: Self-pay

## 2022-03-29 ENCOUNTER — Other Ambulatory Visit: Payer: Self-pay

## 2022-03-29 ENCOUNTER — Encounter: Payer: Self-pay | Admitting: Orthopaedic Surgery

## 2022-03-29 ENCOUNTER — Telehealth: Payer: Self-pay

## 2022-03-29 ENCOUNTER — Ambulatory Visit (INDEPENDENT_AMBULATORY_CARE_PROVIDER_SITE_OTHER): Payer: Commercial Managed Care - HMO | Admitting: Orthopaedic Surgery

## 2022-03-29 DIAGNOSIS — M25561 Pain in right knee: Secondary | ICD-10-CM

## 2022-03-29 DIAGNOSIS — G8929 Other chronic pain: Secondary | ICD-10-CM | POA: Diagnosis not present

## 2022-03-29 MED ORDER — TRAMADOL HCL 50 MG PO TABS
50.0000 mg | ORAL_TABLET | Freq: Two times a day (BID) | ORAL | 0 refills | Status: DC | PRN
  Filled 2022-03-29: qty 30, 15d supply, fill #0

## 2022-03-29 NOTE — Progress Notes (Signed)
Office Visit Note   Patient: Janice [name]           Date of Birth: 01-Dec-1965           MRN: [id number] Visit Date: 03/29/2022              Requested by: Marcine Matar, MD [address] [place] [address] [place],  [place] [address] PCP: Marcine Matar, MD   Assessment & Plan: Visit Diagnoses:  1. Chronic pain of right knee     Plan: Impression is chronic right knee pain.  Based on the patient's symptoms and clinical exam, I feel this is most likely due to medial compartment arthritis, however there could be a component of meniscal pathology.  We have discussed getting approval for viscosupplementation versus MRI.  She would like to first try the viscosupplementation injection.  We will get approval for this and she will follow-up with [place] after.  Call with concerns or questions in the meantime.  This patient is diagnosed with osteoarthritis of the knee(s).    Radiographs show evidence of joint space narrowing, osteophytes, subchondral sclerosis and/or subchondral cysts.  This patient has knee pain which interferes with functional and activities of daily living.    This patient has experienced inadequate response, adverse effects and/or intolerance with conservative treatments such as acetaminophen, NSAIDS, topical creams, physical therapy or regular exercise, knee bracing and/or weight loss.   This patient has experienced inadequate response or has a contraindication to intra articular steroid injections for at least 3 months.   This patient is not scheduled to have a total knee replacement within 6 months of starting treatment with viscosupplementation.   Follow-Up Instructions: Return for f/u after visco approval.   Orders:  No orders of the defined types were placed in this encounter.  Meds ordered this encounter  Medications   traMADol (ULTRAM) 50 MG tablet    Sig: Take 1 tablet (50 mg total) by mouth every 12 (twelve) hours as needed.    Dispense:  30 tablet     Refill:  0      Procedures: No procedures performed   Clinical Data: No additional findings.   Subjective: Chief Complaint  Patient presents with   Right Knee - Pain    HPI patient is a pleasant 56 year old female who comes in today with right knee pain for the past several months which has progressively worsened.  The pain she has is primarily to the anteromedial aspect.  Symptoms appear to be worse with walking, weather change and at night when she is changing positions in bed.  She has been using topical Voltaren without significant relief.  She did undergo cortisone injection back in July which provided about 1 to 1-1/2 months relief in symptoms.  Review of Systems as detailed in HPI.  All others reviewed and are negative.   Objective: Vital Signs: There were no vitals taken for this visit.  Physical Exam well-developed well-nourished female no acute distress.  Alert and oriented x3.  Ortho Exam right knee exam shows a trace effusion.  Range of motion 0 to 100 degrees.  Medial joint line tenderness.  Ligaments are stable.  She is neurovascular tact distally.  Specialty Comments:  No specialty comments available.  Imaging: No new imaging   PMFS History: Patient Active Problem List   Diagnosis Date Noted   Primary osteoarthritis of right knee 12/13/2021   Morbid obesity (HCC) 12/13/2021   Migraine with aura and without status migrainosus, not  intractable 05/31/2021   Severe major depression without psychotic features ([place]) 05/31/2021   Pap smear abnormality of cervix/human papillomavirus (HPV) positive 11/06/2019   Trichomonas infection 11/06/2019   Prediabetes 04/04/2019   Perimenopausal 03/06/2019   Psoriasis 03/06/2019   Vitamin D deficiency 09/29/2015   CHEST PAIN 10/04/2007   OBESITY, NOS 08/02/2006   TOBACCO DEPENDENCE 08/02/2006   Depressive disorder, not elsewhere classified 08/02/2006   HYPERTENSION, BENIGN SYSTEMIC 08/02/2006   Esophageal reflux  08/02/2006   Past Medical History:  Diagnosis Date   Depression    history of   GERD (gastroesophageal reflux disease)    acid reflux at night and with spicy foods   Hypertension    Morbid obesity with BMI of 40.0-44.9, adult ([facility])     Family History  Problem Relation Age of Onset   Hypertension Mother    Kidney disease Mother    Alcohol abuse Father    Depression Father    Drug abuse Father    Hypertension Father    Hypertension Maternal Grandmother    Stroke Maternal Grandmother    Alzheimer's disease Maternal Grandfather    Hypertension Daughter     Past Surgical History:  Procedure Laterality Date   CESAREAN SECTION     x2   Social History   Occupational History   Not on file  Tobacco Use   Smoking status: Every Day    Packs/day: 0.25    Years: 40.00    Total pack years: 10.00    Types: Cigarettes   Smokeless tobacco: Never   Tobacco comments:    Wants to quit in 2021  Vaping Use   Vaping Use: Never used  Substance and Sexual Activity   Alcohol use: Yes    Comment: occ, 2 drinks   Drug use: No   Sexual activity: Yes    Birth control/protection: Condom

## 2022-03-29 NOTE — Telephone Encounter (Signed)
Please precert for right knee gel injection. Dr.Xu's patient. Thanks!  

## 2022-03-30 NOTE — Telephone Encounter (Signed)
VOB submitted for Durolane, right knee.  

## 2022-03-31 ENCOUNTER — Other Ambulatory Visit: Payer: Self-pay

## 2022-04-03 ENCOUNTER — Other Ambulatory Visit: Payer: Self-pay

## 2022-04-04 ENCOUNTER — Encounter: Payer: Self-pay | Admitting: Dietician

## 2022-04-04 ENCOUNTER — Other Ambulatory Visit: Payer: Self-pay

## 2022-04-04 ENCOUNTER — Encounter: Payer: BLUE CROSS/BLUE SHIELD | Attending: Internal Medicine | Admitting: Dietician

## 2022-04-04 DIAGNOSIS — Z713 Dietary counseling and surveillance: Secondary | ICD-10-CM | POA: Insufficient documentation

## 2022-04-04 DIAGNOSIS — Z6841 Body Mass Index (BMI) 40.0 and over, adult: Secondary | ICD-10-CM | POA: Diagnosis not present

## 2022-04-04 NOTE — Patient Instructions (Addendum)
Aim for 150 minutes of physical activity weekly. -start walking more often  Choose low sodium canned goods, or rinse off the water they come in.   Aim to eat 3 meals a day and drink more water.   Consider having your A1c checked by your doctor.  Consider a smoking cessation program.   Since right now you only have access to a freezer or microwave, consider choosing from the following foods:   Shelf stable/frozen foods Proteins: canned chicken, canned tuna, packets of tuna or chicken salad, peanut butter, nuts and seeds.   Starches/carbs/grain: Oatmeal packets (low-sugar) or rolled oats, grits (lower sodium), canned corn, canned beans, canned peas, whole grain crackers (triscuits, wheat thins, whole wheat ritz), whole wheat bread, whole wheat tortillas, corn tortillas, microwave Bens Ready brown rice, whole grain pasta.   Fruits: apples, banana, oranges, frozen fruit, canned fruit (in 100% fruit juice).   Non-starchy vegetables: Frozen veggies, canned green beans or other canned vegetables, tomatoes can sit on the counter, canned tomato sauce  Freeze leftovers for another meal!

## 2022-04-04 NOTE — Progress Notes (Signed)
Medical Nutrition Therapy  Appointment Start time:  0930  Appointment End time:  1032  Primary concerns today: Pt states the reason she wanted to come to a dietitian was for weight loss and to reduce her blood pressure.    Referral diagnosis: E66.01 Preferred learning style: no preference indicated Learning readiness: ready   NUTRITION ASSESSMENT   Anthropometrics  Ht: 60in Wt: 230.6lbs  Clinical Medical Hx: depression, GERD, HTN, prediabetes.   Medications: reviewed Labs: 10/15/2019: A1c 6.0% Notable Signs/Symptoms: pt reports her feet have been swelling over the last month and that they hurt.  Food Allergies: none  Lifestyle & Dietary Hx  Pt just recently became homeless and is living at her friends house. Pt currently only uses the microwave and only keeps shelf stable and freezer foods. She states she can not use the refrigerator or oven. She may be able to use the stovetop.    Pt buys a gallon jug at the grocery store and drinks it over a few days. Pt is only drinking 3-4 cups of water daily. Pt states her friends house has city tap water but she is afraid to drink it.    Pt goes to bed at 11pm, and has insomnia that causes her to stay up till 5am watching TV, which she then sleeps for a couple of hours. Pt is sleeping on 2 chairs at her friends house.   Pt states she was eating lunch and dinner previously and thought if she ate less it would help with weight loss so she now eats 1 meal per day.   Pt goes to the park to walk and meditate once a week to deal with stress. Pt finds that this helps improve her mood.   Pt has a car and receives SNAP benefits.   Estimated daily fluid intake: 24 oz Supplements: MVI Sleep: 4-5 hours, pt states she has some insomnia which keeps her up at night. She states her mind won't shut down for her to go to sleep.  Stress / self-care: high stress Current average weekly physical activity: pt goes to the park once a week for 30 minutes to  walk.   24-Hr Dietary Recall First Meal: none Snack: none Second Meal: none Snack: none Third Meal: 1-2 cans of vienna sausage and crackers OR TV dinner Snack: none Beverages: 3-4 cups of water  NUTRITION DIAGNOSIS  NB-3.2 Limited access to food or water As related to  homelessness.  As evidenced by pt report and diet history.   NUTRITION INTERVENTION  Nutrition education (E-1) on the following topics:  GERD nutrition therapy MyPlate Building balanced meals with limited access to foods/cookware Impact of stress on health Benefits of physical activity and goals Importance of adequate sleep Importance of eating consistent meals A1C  Handouts Provided Include  Nutrition Care Manual: GERD Nutrition Therapy Dish Up a Healthy Meal American Heart Association: Guide to Better Sleep  Learning Style & Readiness for Change Teaching method utilized: Visual & Auditory  Demonstrated degree of understanding via: Teach Back  Barriers to learning/adherence to lifestyle change: none  Goals Established by Pt Aim for 150 minutes of physical activity weekly. -start walking more often  Choose low sodium canned goods, or rinse off the water they come in.   Consider having your A1c checked by your doctor.  Consider a smoking cessation program.   Aim to eat 3 meals a day and drink more water.  Since right now you only have access to a freezer or microwave, consider  choosing from the following foods:   Shelf stable/frozen foods Proteins: canned chicken, canned tuna, packets of tuna or chicken salad, peanut butter, nuts and seeds.   Starches/carbs/grain: Oatmeal packets (low-sugar) or rolled oats, grits (lower sodium), canned corn, canned beans, canned peas, whole grain crackers (triscuits, wheat thins, whole wheat ritz), whole wheat bread, whole wheat tortillas, corn tortillas, microwave Bens Ready brown rice, whole grain pasta.   Fruits: apples, banana, oranges, frozen fruit, canned  fruit (in 100% fruit juice).   Non-starchy vegetables: Frozen veggies, canned green beans or other canned vegetables, tomatoes can sit on the counter, canned tomato sauce  Freeze leftovers for another meal!  MONITORING & EVALUATION Dietary intake, weekly physical activity, and follow up in 2 months.  Next Steps  Patient is to call for questions.

## 2022-04-13 ENCOUNTER — Telehealth: Payer: Self-pay | Admitting: Orthopaedic Surgery

## 2022-04-13 NOTE — Telephone Encounter (Signed)
PA Currently pending through [facility]

## 2022-04-13 NOTE — Telephone Encounter (Signed)
Talked with patient concerning gel injection.  Advised that she will receive a call once approved through her insurance.  Patient voiced that she understands.

## 2022-04-13 NOTE — Telephone Encounter (Signed)
Pt called about an update for right knee gel injection approval. Please call pt at [phone]

## 2022-04-14 ENCOUNTER — Telehealth: Payer: Self-pay

## 2022-04-14 NOTE — Telephone Encounter (Signed)
Called and left a VM for patient to [facility] concerning insurance for gel injection.   Advised by Rosann Auerbach that medical coverage ended on 03/04/2022.

## 2022-05-01 ENCOUNTER — Encounter: Payer: Self-pay | Admitting: Internal Medicine

## 2022-05-01 ENCOUNTER — Other Ambulatory Visit: Payer: Self-pay

## 2022-05-01 ENCOUNTER — Ambulatory Visit: Payer: BLUE CROSS/BLUE SHIELD | Attending: Internal Medicine | Admitting: Internal Medicine

## 2022-05-01 VITALS — BP 200/100 | HR 66 | Temp 97.8°F | Ht 60.0 in | Wt 231.0 lb

## 2022-05-01 DIAGNOSIS — Z1211 Encounter for screening for malignant neoplasm of colon: Secondary | ICD-10-CM

## 2022-05-01 DIAGNOSIS — F321 Major depressive disorder, single episode, moderate: Secondary | ICD-10-CM

## 2022-05-01 DIAGNOSIS — F17200 Nicotine dependence, unspecified, uncomplicated: Secondary | ICD-10-CM | POA: Diagnosis not present

## 2022-05-01 DIAGNOSIS — Z1231 Encounter for screening mammogram for malignant neoplasm of breast: Secondary | ICD-10-CM

## 2022-05-01 DIAGNOSIS — L409 Psoriasis, unspecified: Secondary | ICD-10-CM

## 2022-05-01 DIAGNOSIS — I10 Essential (primary) hypertension: Secondary | ICD-10-CM

## 2022-05-01 DIAGNOSIS — Z2821 Immunization not carried out because of patient refusal: Secondary | ICD-10-CM

## 2022-05-01 MED ORDER — VALSARTAN 40 MG PO TABS
40.0000 mg | ORAL_TABLET | Freq: Every day | ORAL | 3 refills | Status: DC
  Filled 2022-05-01: qty 30, 30d supply, fill #0

## 2022-05-01 MED ORDER — SERTRALINE HCL 25 MG PO TABS
25.0000 mg | ORAL_TABLET | Freq: Every day | ORAL | 5 refills | Status: DC
  Filled 2022-05-01: qty 30, 30d supply, fill #0
  Filled 2022-06-20 – 2022-06-28 (×2): qty 30, 30d supply, fill #1
  Filled 2022-08-01: qty 30, 30d supply, fill #2

## 2022-05-01 NOTE — Patient Instructions (Signed)
Your blood pressure is quite elevated.  Continue amlodipine 10 mg daily, propranolol 80 mg daily, spironolactone 12.5 mg daily.  We have added a new blood pressure medicine called valsartan 40 mg daily.  Continue to monitor your blood pressure at least twice a week and record the readings.  Follow-up in 2 weeks with the clinical pharmacist for repeat blood pressure check.  I have given you printed information for counselors in the area.  Please contact 1 or 2 of them and try to get in for ongoing counseling.  We have added Zoloft 25 mg daily to the bupropion for depression.

## 2022-05-01 NOTE — Progress Notes (Signed)
Patient ID: SHYONNA CARLIN, female    DOB: 1965/12/02  MRN: [id number]  CC: Hypertension (HTN f/u. Jolayne Panther pain in R knee. Injections temporarily helped. )   Subjective: Janice [name] is a 56 y.o. female who presents for chronic disease management Her concerns today include:  Pt with hx of HTN, tob dep, Vit D def, preDM/morbid obesity, MDD, +HPV on pap, GERD, chronic hypokalemia, psoriasis right leg   OA knee: Followed by Dr. Roda Shutters.  She last saw him a month ago.  He plans to try her with viscosupplementation.  Waiting to get approved from her insurance.  She now has [employer].  HTN:  checks with wrist device.   Reports readings were high in the 200s Limits salt in foods.  Saw nutritionist and found it helpful.  Down 5 lbs since last visit.  Appetite down due to depression sometimes.   Endorses HA at times.  She is on propranolol extended release 80 mg through neurologist Dr. Everlena Cooper. also endorses CP last wk when she had HA.  Was stabbing in character   Tob dep:  prescribed patches on last visit.  She has not started them as yet.  Continues to smoke.  MDD:  had called [facility] and was told that appt is 3 mth out.   Still has episodes of uncontrollable crying.  Does not feel Buproprion helps.  Would like some counseling.  Given information on last visit for behavioral health consult was in the area.  She is not sure whether she still has it or not.  She had used it to call [facility] behavioral health however.  Rash on RT leg.  Had seen dermatologist for this and dx with psoriasis Tazarotene gel not helping.   HM:  declines flu and shingles shot.  Agrees for MMG.  Never had c-scope.  Prefers c-scope.  Due for pap  Current Outpatient Medications on File Prior to Visit  Medication Sig Dispense Refill   buPROPion (WELLBUTRIN SR) 150 MG 12 hr tablet Take 1 tablet (150 mg total) by mouth 2 (two) times daily. 60 tablet 2   propranolol ER (INDERAL LA) 80 MG 24 hr capsule Take 1  capsule (80 mg total) by mouth daily. 30 capsule 5   spironolactone (ALDACTONE) 25 MG tablet Take 0.5 tablets (12.5 mg total) by mouth daily. 30 tablet 6   traMADol (ULTRAM) 50 MG tablet Take 1 tablet (50 mg total) by mouth every 12 (twelve) hours as needed. 30 tablet 0   amLODipine (NORVASC) 10 MG tablet Take 1 tablet (10 mg total) by mouth once nightly at bedtime. (Patient not taking: Reported on 05/01/2022) 30 tablet 4   aspirin EC 81 MG tablet Take 81 mg by mouth every 4 (four) hours as needed for moderate pain. Swallow whole. (Patient not taking: Reported on 05/01/2022)     clobetasol cream (TEMOVATE) 0.05 % Apply 1 Application topically 2 (two) times daily. (Patient not taking: Reported on 05/01/2022) 30 g 0   cyclobenzaprine (FLEXERIL) 10 MG tablet Take 1 tablet (10 mg total) by mouth 2 (two) times daily as needed for muscle spasms. (Patient not taking: Reported on 05/01/2022) 20 tablet 0   diclofenac Sodium (VOLTAREN) 1 % GEL Apply 2 grams topically 4 (four) times daily. (Patient not taking: Reported on 05/01/2022) 100 g 1   gadopiclenol (VUEWAY) 0.5 MMOL/ML solution Inject 10 mLs into the vein once as needed for up to 1 dose for contrast. (Patient not taking: Reported on 05/01/2022) 10 mL  0   meloxicam (MOBIC) 7.5 MG tablet Take 1 tablet (7.5 mg total) by mouth 2 (two) times daily as needed for pain. (Patient not taking: Reported on 05/01/2022) 30 tablet 2   nicotine (NICODERM CQ - DOSED IN MG/24 HOURS) 21 mg/24hr patch Place 1 patch (21 mg total) onto the skin daily. (Patient not taking: Reported on 05/01/2022) 28 patch 1   tazarotene (TAZORAC) 0.05 % gel Apply topically at bedtime. (Patient not taking: Reported on 05/01/2022) 30 g 0   triamcinolone cream (KENALOG) 0.1 % Apply 1 application topically 2 (two) times daily. (Patient not taking: Reported on 05/01/2022) 30 g 2   No current facility-administered medications on file prior to visit.    Allergies  Allergen Reactions   Losartan      Chest pain   Neomycin Sulfate Swelling, Rash and Other (See Comments)    Pain in ear, swelling and redness    Social History   Socioeconomic History   Marital status: Single    Spouse name: Not on file   Number of children: 3   Years of education: Not on file   Highest education level: Not on file  Occupational History   Not on file  Tobacco Use   Smoking status: Every Day    Packs/day: 0.25    Years: 40.00    Total pack years: 10.00    Types: Cigarettes   Smokeless tobacco: Never   Tobacco comments:    Wants to quit in 2021  Vaping Use   Vaping Use: Never used  Substance and Sexual Activity   Alcohol use: Yes    Comment: occ, 2 drinks   Drug use: No   Sexual activity: Yes    Birth control/protection: Condom  Other Topics Concern   Not on file  Social History Narrative   Not on file   Social Determinants of Health   Financial Resource Strain: Not on file  Food Insecurity: Food Insecurity Present (04/04/2022)   Hunger Vital Sign    Worried About Running Out of Food in the Last Year: Sometimes true    Ran Out of Food in the Last Year: Sometimes true  Transportation Needs: Not on file  Physical Activity: Insufficiently Active (03/06/2019)   Exercise Vital Sign    Days of Exercise per Week: 1 day    Minutes of Exercise per Session: 30 min  Stress: No Stress Concern Present (03/06/2019)   [employer] of Occupational Health - Occupational Stress Questionnaire    Feeling of Stress : Only a little  Social Connections: Not on file  Intimate Partner Violence: Unknown (03/06/2019)   Humiliation, Afraid, Rape, and Kick questionnaire    Fear of Current or Ex-Partner: Patient refused    Emotionally Abused: Patient refused    Physically Abused: Patient refused    Sexually Abused: Patient refused    Family History  Problem Relation Age of Onset   Hypertension Mother    Kidney disease Mother    Alcohol abuse Father    Depression Father    Drug abuse Father     Hypertension Father    Hypertension Maternal Grandmother    Stroke Maternal Grandmother    Alzheimer's disease Maternal Grandfather    Hypertension Daughter     Past Surgical History:  Procedure Laterality Date   CESAREAN SECTION     x2    ROS: Review of Systems Negative except as stated above  PHYSICAL EXAM: BP (!) 185/138 (BP Location: Left Arm, Patient Position: Sitting, Cuff  Size: Normal)   Pulse 66   Temp 97.8 F (36.6 C) (Oral)   Ht 5' (1.524 m)   Wt 231 lb (104.8 kg)   SpO2 97%   BMI 45.11 kg/m   Wt Readings from Last 3 Encounters:  05/01/22 231 lb (104.8 kg)  04/04/22 230 lb 9.6 oz (104.6 kg)  01/03/22 235 lb (106.6 kg)    Physical Exam  General appearance - alert, well appearing, older African-American female and in no distress Mental status - normal mood, behavior, speech, dress, motor activity, and thought processes Neck - supple, no significant adenopathy Chest - clear to auscultation, no wheezes, rales or rhonchi, symmetric air entry Heart - normal rate, regular rhythm, normal S1, S2, no murmurs, rubs, clicks or gallops Extremities - peripheral pulses normal, no pedal edema, no clubbing or cyanosis Skin: Rough hyperpigmented area covering most of the right lateral lower leg below the knee.    05/01/2022    2:54 PM 04/04/2022    9:33 AM 12/26/2021    5:23 PM  Depression screen PHQ 2/9  Decreased Interest 2 1 2   Down, Depressed, Hopeless 3 1 3   PHQ - 2 Score 5 2 5   Altered sleeping 3  3  Tired, decreased energy 3  3  Change in appetite 3  3  Feeling bad or failure about yourself    3  Trouble concentrating 2  3  Moving slowly or fidgety/restless 2  1  Suicidal thoughts 0  0  PHQ-9 Score 18  21       Latest Ref Rng & Units 05/31/2021    3:44 PM 04/14/2021   10:35 PM 04/08/2021    6:00 AM  CMP  Glucose 70 - 99 mg/dL 95  098100  119126   BUN 6 - 24 mg/dL 18  22  22    Creatinine 0.57 - 1.00 mg/dL 1.471.23  8.291.11  5.621.27   Sodium 134 - 144 mmol/L 143  134   136   Potassium 3.5 - 5.2 mmol/L 3.6  2.6  2.7   Chloride 96 - 106 mmol/L 100  94  96   CO2 20 - 29 mmol/L 31  31  29    Calcium 8.7 - 10.2 mg/dL 9.1  8.9  9.0   Total Protein 6.5 - 8.1 g/dL  7.8    Total Bilirubin 0.3 - 1.2 mg/dL  0.8    Alkaline Phos 38 - 126 U/L  98    AST 15 - 41 U/L  25    ALT 0 - 44 U/L  27     Lipid Panel     Component Value Date/Time   CHOL 162 05/31/2021 1544   TRIG 91 05/31/2021 1544   HDL 43 05/31/2021 1544   CHOLHDL 3.8 05/31/2021 1544   CHOLHDL 3.7 08/24/2016 1634   VLDL 22 08/24/2016 1634   LDLCALC 102 (H) 05/31/2021 1544    CBC    Component Value Date/Time   WBC 10.9 (H) 04/14/2021 2235   RBC 5.89 (H) 04/14/2021 2235   HGB 14.0 04/14/2021 2235   HGB 13.5 03/06/2019 1344   HCT 45.0 04/14/2021 2235   HCT 44.3 03/06/2019 1344   PLT 335 04/14/2021 2235   PLT 308 03/06/2019 1344   MCV 76.4 (L) 04/14/2021 2235   MCV 77 (L) 03/06/2019 1344   MCH 23.8 (L) 04/14/2021 2235   MCHC 31.1 04/14/2021 2235   RDW 13.9 04/14/2021 2235   RDW 14.4 03/06/2019 1344   LYMPHSABS 2.7 04/14/2021 2235  LYMPHSABS 2.2 03/06/2019 1344   MONOABS 0.5 04/14/2021 2235   EOSABS 0.1 04/14/2021 2235   EOSABS 0.2 03/06/2019 1344   BASOSABS 0.0 04/14/2021 2235   BASOSABS 0.1 03/06/2019 1344    ASSESSMENT AND PLAN: 1. Essential hypertension Not at goal. Add Diovan 40 mg daily.  Continue low-dose spironolactone due to history of hypokalemia, continue Norvasc 10 mg daily and propranolol LA 80 mg daily. Continue to check blood pressure at home with goal being 130/80 or lower. Follow-up with clinical pharmacist in 2 weeks.  If blood pressure still not at goal, we can consider changing propranolol to labetalol.  Please check chemistry on that visit given we have added Diovan today. - valsartan (DIOVAN) 40 MG tablet; Take 1 tablet (40 mg total) by mouth daily.  Dispense: 30 tablet; Refill: 3 - CBC - Comprehensive metabolic panel - Lipid panel  2. Tobacco  dependence Advised to quit.  Patient not ready to quit as yet.  She has the patches and will use them when she is ready.  3. Moderate major depression (HCC) Not as controlled as she would like.  Referred to behavioral health for counseling.  Message sent to our referral coordinator to assist with this. We discussed changing bupropion to a different medication versus adding another antidepressant.  She prefers to have another one added.  We will start with low-dose Zoloft. - sertraline (ZOLOFT) 25 MG tablet; Take 1 tablet (25 mg total) by mouth daily.  Dispense: 30 tablet; Refill: 5  4. Psoriasis Advised patient to call dermatologist and request a follow-up appointment to let them know that her dermatitis has not improved with the prescribed medication.  5. Influenza vaccination declined Recommended.  Patient declined.  6. Screening for colon cancer - Ambulatory referral to Gastroenterology  7. Encounter for screening mammogram for malignant neoplasm of breast - MM Digital Screening; Future    Patient was given the opportunity to ask questions.  Patient verbalized understanding of the plan and was able to repeat key elements of the plan.   This documentation was completed using Paediatric nurse.  Any transcriptional errors are unintentional.  Orders Placed This Encounter  Procedures   MM Digital Screening   CBC   Comprehensive metabolic panel   Lipid panel   Ambulatory referral to Gastroenterology     Requested Prescriptions   Signed Prescriptions Disp Refills   valsartan (DIOVAN) 40 MG tablet 30 tablet 3    Sig: Take 1 tablet (40 mg total) by mouth daily.   sertraline (ZOLOFT) 25 MG tablet 30 tablet 5    Sig: Take 1 tablet (25 mg total) by mouth daily.    Return in about 4 months (around 08/30/2022) for Appt with [facility] in 2 wks for BP check.  Jonah Blue, MD, FACP

## 2022-05-02 LAB — LIPID PANEL
Chol/HDL Ratio: 3.6 ratio (ref 0.0–4.4)
Cholesterol, Total: 181 mg/dL (ref 100–199)
HDL: 50 mg/dL (ref >39)
LDL Chol Calc (NIH): 106 mg/dL — ABNORMAL HIGH (ref 0–99)
Triglycerides: 141 mg/dL (ref 0–149)
VLDL Cholesterol Cal: 25 mg/dL (ref 5–40)

## 2022-05-02 LAB — CBC
Hematocrit: 47.3 % — ABNORMAL HIGH (ref 34.0–46.6)
Hemoglobin: 14.9 g/dL (ref 11.1–15.9)
MCH: 24.5 pg — ABNORMAL LOW (ref 26.6–33.0)
MCHC: 31.5 g/dL (ref 31.5–35.7)
MCV: 78 fL — ABNORMAL LOW (ref 79–97)
Platelets: 299 10*3/uL (ref 150–450)
RBC: 6.08 x10E6/uL — ABNORMAL HIGH (ref 3.77–5.28)
RDW: 13.4 % (ref 11.7–15.4)
WBC: 11.2 10*3/uL — ABNORMAL HIGH (ref 3.4–10.8)

## 2022-05-02 LAB — COMPREHENSIVE METABOLIC PANEL
ALT: 9 IU/L (ref 0–32)
AST: 21 IU/L (ref 0–40)
Albumin/Globulin Ratio: 1.1 — ABNORMAL LOW (ref 1.2–2.2)
Albumin: 4.1 g/dL (ref 3.8–4.9)
Alkaline Phosphatase: 152 IU/L — ABNORMAL HIGH (ref 44–121)
BUN/Creatinine Ratio: 10 (ref 9–23)
BUN: 15 mg/dL (ref 6–24)
Bilirubin Total: 0.7 mg/dL (ref 0.0–1.2)
CO2: 23 mmol/L (ref 20–29)
Calcium: 9.6 mg/dL (ref 8.7–10.2)
Chloride: 98 mmol/L (ref 96–106)
Creatinine, Ser: 1.46 mg/dL — ABNORMAL HIGH (ref 0.57–1.00)
Globulin, Total: 3.7 g/dL (ref 1.5–4.5)
Glucose: 93 mg/dL (ref 70–99)
Potassium: 5.3 mmol/L — ABNORMAL HIGH (ref 3.5–5.2)
Sodium: 140 mmol/L (ref 134–144)
Total Protein: 7.8 g/dL (ref 6.0–8.5)
eGFR: 42 mL/min/{1.73_m2} — ABNORMAL LOW (ref >59)

## 2022-05-03 ENCOUNTER — Telehealth: Payer: Self-pay | Admitting: Internal Medicine

## 2022-05-03 ENCOUNTER — Other Ambulatory Visit: Payer: Self-pay

## 2022-05-03 DIAGNOSIS — D72829 Elevated white blood cell count, unspecified: Secondary | ICD-10-CM

## 2022-05-03 DIAGNOSIS — D751 Secondary polycythemia: Secondary | ICD-10-CM

## 2022-05-03 DIAGNOSIS — N1832 Chronic kidney disease, stage 3b: Secondary | ICD-10-CM

## 2022-05-03 DIAGNOSIS — E875 Hyperkalemia: Secondary | ICD-10-CM

## 2022-05-03 HISTORY — DX: Chronic kidney disease, stage 3b: N18.32

## 2022-05-03 MED ORDER — LABETALOL HCL 100 MG PO TABS
100.0000 mg | ORAL_TABLET | Freq: Two times a day (BID) | ORAL | 4 refills | Status: DC
  Filled 2022-05-03: qty 60, 30d supply, fill #0
  Filled 2022-06-20 – 2022-06-28 (×2): qty 60, 30d supply, fill #1
  Filled 2022-08-01 – 2022-08-03 (×2): qty 60, 30d supply, fill #2
  Filled 2022-11-13: qty 60, 30d supply, fill #3

## 2022-05-03 NOTE — Telephone Encounter (Signed)
Called & spoke to patient. Confirmed name & DOB. Patient was scheduled to come in next Wednesday 05/10/2022.

## 2022-05-03 NOTE — Telephone Encounter (Signed)
Phone call placed to patient today to go over the results of lab tests. Patient informed that kidney function is not at 100% and is a little worse compared to when it was checked 1 year ago.  GFR last year was 52.  This time it is 42.  Creatinine is 1.46.  Over the past year it had ranged 1.1-1.36.  Patient states she uses Advil sometimes but not on a daily or weekly basis.  States she was told before by the hospital to avoid taking it due to her kidney.  I advised her not to take Aleve, Advil, Naprosyn, ibuprofen or Motrin.  Advised to drink several glasses of water daily. -Will have her return to the lab to check a urine microalbumin.  -Potassium level is elevated at 5.3.  Previous levels were low.  She is on spironolactone 25 mg half a tablet daily.  She endorses eating bananas and oranges frequently.  Request that she cuts back on eating these potassium rich foods.  Return to the lab in 1 week to have potassium level rechecked. -hold off on starting Diovan as was discussed yesterday.  She has not picked up rxn as yet.  She has 6 pills left of Propranolol.  Advised to d/c Propranolol.  Will change to Labetalol instead.  Patient with chronic persistent elevation in WBC and red blood cell count.  Advised that we should have her see a hematologist to have this evaluated.  Patient is okay with me submitting this referral.  Alkphos elev:  will have lab add GGT.  HL:  LDL 106 increased from 102.  ASCVD score elev.  She just saw nutritionist 1 month ago.  We agreed to have her work on changes in eating habits until next visit.  We will plan to recheck lipid profile at that time.  If still elevated with increased ASCVD score, we will start statin therapy.

## 2022-05-05 ENCOUNTER — Telehealth: Payer: Self-pay | Admitting: Oncology

## 2022-05-05 ENCOUNTER — Other Ambulatory Visit: Payer: Self-pay

## 2022-05-05 ENCOUNTER — Ambulatory Visit: Payer: Commercial Managed Care - HMO | Admitting: Neurology

## 2022-05-05 NOTE — Telephone Encounter (Signed)
Spoke with patient confirming new patient appointment 12/22 

## 2022-05-10 ENCOUNTER — Telehealth: Payer: Self-pay | Admitting: Internal Medicine

## 2022-05-10 ENCOUNTER — Ambulatory Visit: Payer: Medicaid Other | Attending: Family Medicine

## 2022-05-10 DIAGNOSIS — E875 Hyperkalemia: Secondary | ICD-10-CM

## 2022-05-10 DIAGNOSIS — N1832 Chronic kidney disease, stage 3b: Secondary | ICD-10-CM

## 2022-05-10 NOTE — Telephone Encounter (Signed)
-----   Message from Dionne Bucy sent at 05/09/2022  4:40 PM EST ----- Regarding: Psyquiatry Referral 1 Referral was sent to  [facility]  mo answer    I sent  a new  referral to   [facility] [address]  [address] [place], [place] [address] Phone: [phone] Fax: [phone]   ----- Message ----- From: Marcine Matar, MD Sent: 05/01/2022   5:56 PM EST To: Dionne Bucy  Can you please assist with getting her in to a behavioral health counselor.  I have submitted the referral.

## 2022-05-11 ENCOUNTER — Other Ambulatory Visit: Payer: Self-pay | Admitting: Internal Medicine

## 2022-05-11 ENCOUNTER — Ambulatory Visit: Payer: Commercial Managed Care - HMO | Admitting: Dietician

## 2022-05-11 DIAGNOSIS — N1832 Chronic kidney disease, stage 3b: Secondary | ICD-10-CM

## 2022-05-11 LAB — MICROALBUMIN / CREATININE URINE RATIO
Creatinine, Urine: 697 mg/dL
Microalb/Creat Ratio: 17 mg/g creat (ref 0–29)
Microalbumin, Urine: 115.8 ug/mL

## 2022-05-11 LAB — BASIC METABOLIC PANEL
BUN/Creatinine Ratio: 10 (ref 9–23)
BUN: 17 mg/dL (ref 6–24)
CO2: 26 mmol/L (ref 20–29)
Calcium: 9.6 mg/dL (ref 8.7–10.2)
Chloride: 99 mmol/L (ref 96–106)
Creatinine, Ser: 1.67 mg/dL — ABNORMAL HIGH (ref 0.57–1.00)
Glucose: 98 mg/dL (ref 70–99)
Potassium: 4.1 mmol/L (ref 3.5–5.2)
Sodium: 142 mmol/L (ref 134–144)
eGFR: 36 mL/min/{1.73_m2} — ABNORMAL LOW (ref >59)

## 2022-05-15 ENCOUNTER — Telehealth: Payer: Self-pay

## 2022-05-15 NOTE — Telephone Encounter (Signed)
-----   Message from Marcine Matar, MD sent at 05/11/2022  9:30 PM EST ----- Let patient know that her potassium level has normalized.  Her kidney function is still not 100% and slightly worse compared to when it was checked a little over a week ago.  I will refer her to a kidney specialist for further evaluation and management.

## 2022-05-15 NOTE — Telephone Encounter (Signed)
Pt was called and is aware of results, DOB was confirmed.  ?

## 2022-05-19 ENCOUNTER — Other Ambulatory Visit: Payer: Self-pay

## 2022-05-23 ENCOUNTER — Other Ambulatory Visit: Payer: Self-pay

## 2022-05-24 ENCOUNTER — Other Ambulatory Visit: Payer: Self-pay

## 2022-05-26 ENCOUNTER — Other Ambulatory Visit: Payer: Self-pay

## 2022-05-26 ENCOUNTER — Inpatient Hospital Stay: Payer: Medicaid Other

## 2022-05-26 ENCOUNTER — Inpatient Hospital Stay: Payer: Medicaid Other | Attending: Oncology | Admitting: Oncology

## 2022-05-26 ENCOUNTER — Encounter: Payer: Self-pay | Admitting: Oncology

## 2022-05-26 VITALS — BP 138/88 | HR 70 | Temp 98.2°F | Resp 16 | Wt 229.1 lb

## 2022-05-26 DIAGNOSIS — N189 Chronic kidney disease, unspecified: Secondary | ICD-10-CM | POA: Diagnosis not present

## 2022-05-26 DIAGNOSIS — Z79899 Other long term (current) drug therapy: Secondary | ICD-10-CM | POA: Insufficient documentation

## 2022-05-26 DIAGNOSIS — Z7982 Long term (current) use of aspirin: Secondary | ICD-10-CM | POA: Diagnosis not present

## 2022-05-26 DIAGNOSIS — I10 Essential (primary) hypertension: Secondary | ICD-10-CM | POA: Diagnosis not present

## 2022-05-26 DIAGNOSIS — N1832 Chronic kidney disease, stage 3b: Secondary | ICD-10-CM | POA: Diagnosis not present

## 2022-05-26 DIAGNOSIS — F17210 Nicotine dependence, cigarettes, uncomplicated: Secondary | ICD-10-CM | POA: Insufficient documentation

## 2022-05-26 DIAGNOSIS — D751 Secondary polycythemia: Secondary | ICD-10-CM

## 2022-05-26 DIAGNOSIS — D72829 Elevated white blood cell count, unspecified: Secondary | ICD-10-CM

## 2022-05-26 DIAGNOSIS — K219 Gastro-esophageal reflux disease without esophagitis: Secondary | ICD-10-CM | POA: Diagnosis not present

## 2022-05-26 DIAGNOSIS — G4733 Obstructive sleep apnea (adult) (pediatric): Secondary | ICD-10-CM

## 2022-05-26 HISTORY — DX: Elevated white blood cell count, unspecified: D72.829

## 2022-05-26 HISTORY — DX: Secondary polycythemia: D75.1

## 2022-05-26 LAB — CBC WITH DIFFERENTIAL (CANCER CENTER ONLY)
Abs Immature Granulocytes: 0.06 10*3/uL (ref 0.00–0.07)
Basophils Absolute: 0.1 10*3/uL (ref 0.0–0.1)
Basophils Relative: 1 %
Eosinophils Absolute: 0.1 10*3/uL (ref 0.0–0.5)
Eosinophils Relative: 1 %
HCT: 41.9 % (ref 36.0–46.0)
Hemoglobin: 13.2 g/dL (ref 12.0–15.0)
Immature Granulocytes: 1 %
Lymphocytes Relative: 20 %
Lymphs Abs: 2.1 10*3/uL (ref 0.7–4.0)
MCH: 24.4 pg — ABNORMAL LOW (ref 26.0–34.0)
MCHC: 31.5 g/dL (ref 30.0–36.0)
MCV: 77.3 fL — ABNORMAL LOW (ref 80.0–100.0)
Monocytes Absolute: 0.5 10*3/uL (ref 0.1–1.0)
Monocytes Relative: 5 %
Neutro Abs: 7.6 10*3/uL (ref 1.7–7.7)
Neutrophils Relative %: 72 %
Platelet Count: 289 10*3/uL (ref 150–400)
RBC: 5.42 MIL/uL — ABNORMAL HIGH (ref 3.87–5.11)
RDW: 14.2 % (ref 11.5–15.5)
WBC Count: 10.5 10*3/uL (ref 4.0–10.5)
nRBC: 0 % (ref 0.0–0.2)

## 2022-05-26 LAB — RETICULOCYTES
Immature Retic Fract: 14.9 % (ref 2.3–15.9)
RBC.: 5.41 MIL/uL — ABNORMAL HIGH (ref 3.87–5.11)
Retic Count, Absolute: 59 10*3/uL (ref 19.0–186.0)
Retic Ct Pct: 1.1 % (ref 0.4–3.1)

## 2022-05-26 LAB — SEDIMENTATION RATE: Sed Rate: 25 mm/hr — ABNORMAL HIGH (ref 0–22)

## 2022-05-26 LAB — C-REACTIVE PROTEIN: CRP: 0.9 mg/dL (ref <1.0)

## 2022-05-26 LAB — TSH: TSH: 1.61 u[IU]/mL (ref 0.350–4.500)

## 2022-05-26 LAB — T4, FREE: Free T4: 0.64 ng/dL (ref 0.61–1.12)

## 2022-05-26 LAB — CORTISOL: Cortisol, Plasma: 9.1 ug/dL

## 2022-05-26 LAB — FERRITIN: Ferritin: 83 ng/mL (ref 11–307)

## 2022-05-26 NOTE — Progress Notes (Unsigned)
[facility] Initial Visit:  Patient Care Team: Ladell Pier, MD as PCP - General (Internal Medicine) Pieter Partridge, DO as Consulting Physician (Neurology) Lavonna Monarch, MD (Inactive) as Consulting Physician (Dermatology)  CHIEF COMPLAINTS/PURPOSE OF CONSULTATION:  Oncology History   No history exists.    HISTORY OF PRESENTING ILLNESS: Janice [name] 56 y.o. female is here because of polycythemia and granulocytosis Medical history notable for GERD, hypertension, morbid obesity, chronic kidney disease   Oct 13, 2021: MRI of brain shows probable mild to moderate chronic microvascular ischemic changes April 14, 2022 WBC 10.9 hemoglobin 14.0 MCV 76 platelet count 335; 69 segs  May 01, 2022 CMP notable for creatinine 1.46 potassium 5.3 alk phos 152  May 22, 2022: Nephrology consult placed  May 26, 2022: [facility] Consult  Snores loud enough to knock paint off walls  Social:  Single.  Has worked in Engineer, maintenance in a factory.  Tobacco began smoking cigarettes at 60 yoa, smokes 1/3 ppd.  EtOH wine cooler 2 to 3 times a week  [facility] Mother died 36 HIV Father died 65 pancreatic cancer Brother alive 65 well  Sister died 42 MVA   Review of Systems  Constitutional:  Positive for fatigue. Negative for appetite change, chills, fever and unexpected weight change.  HENT:   Positive for tinnitus. Negative for lump/mass, mouth sores, nosebleeds, sore throat, trouble swallowing and voice change.   Eyes:  Negative for eye problems and icterus.       Vision changes:  None  Respiratory:  Negative for chest tightness, cough, hemoptysis and wheezing.        DOE while sweeping and mopping  Cardiovascular:  Positive for leg swelling and palpitations.       Chest pain at night when tries to go to sleep.  Has also experienced it at rest.  Helped by taking a few deep breaths.  Lasts 5 minutes.  Has been going on for 6 months and not getting worse.     Gastrointestinal:  Positive for constipation and nausea. Negative for abdominal pain, blood in stool, diarrhea and vomiting.  Endocrine: Negative for hot flashes.       Cold intolerance:  none Heat intolerance:  none  Genitourinary:  Negative for bladder incontinence, difficulty urinating, dysuria, frequency, hematuria and nocturia.   Musculoskeletal:  Positive for gait problem. Negative for arthralgias, back pain, myalgias, neck pain and neck stiffness.  Skin:  Negative for itching, rash and wound.  Neurological:  Positive for dizziness and gait problem. Negative for headaches, light-headedness, numbness and speech difficulty.       Has had a few missteps but no falls  Hematological:  Negative for adenopathy. Bruises/bleeds easily.  Psychiatric/Behavioral:  Negative for suicidal ideas. The patient is not nervous/anxious.        Hard to get to sleep at night    MEDICAL HISTORY: Past Medical History:  Diagnosis Date  . Depression    history of  . GERD (gastroesophageal reflux disease)    acid reflux at night and with spicy foods  . Hypertension   . Morbid obesity with BMI of 40.0-44.9, adult [facility])     SURGICAL HISTORY: Past Surgical History:  Procedure Laterality Date  . CESAREAN SECTION     x2    SOCIAL HISTORY: Social History   Socioeconomic History  . Marital status: Single    Spouse name: Not on file  . Number of children: 3  . Years of education: Not on  file  . Highest education level: Not on file  Occupational History  . Not on file  Tobacco Use  . Smoking status: Every Day    Packs/day: 0.25    Years: 40.00    Total pack years: 10.00    Types: Cigarettes  . Smokeless tobacco: Never  . Tobacco comments:    Wants to quit in 2021  Vaping Use  . Vaping Use: Never used  Substance and Sexual Activity  . Alcohol use: Yes    Comment: occ, 2 drinks  . Drug use: No  . Sexual activity: Yes    Birth control/protection: Condom  Other Topics Concern  . Not on  file  Social History Narrative  . Not on file   Social Determinants of Health   Financial Resource Strain: Not on file  Food Insecurity: Food Insecurity Present (04/04/2022)   Hunger Vital Sign   . Worried About Charity fundraiser in the Last Year: Sometimes true   . Ran Out of Food in the Last Year: Sometimes true  Transportation Needs: Not on file  Physical Activity: Insufficiently Active (03/06/2019)   Exercise Vital Sign   . Days of Exercise per Week: 1 day   . Minutes of Exercise per Session: 30 min  Stress: No Stress Concern Present (03/06/2019)   [place]   . Feeling of Stress : Only a little  Social Connections: Not on file  Intimate Partner Violence: Unknown (03/06/2019)   Humiliation, Afraid, Rape, and Kick questionnaire   . Fear of Current or Ex-Partner: Patient refused   . Emotionally Abused: Patient refused   . Physically Abused: Patient refused   . Sexually Abused: Patient refused    FAMILY HISTORY Family History  Problem Relation Age of Onset  . Hypertension Mother   . Kidney disease Mother   . Alcohol abuse Father   . Depression Father   . Drug abuse Father   . Hypertension Father   . Hypertension Maternal Grandmother   . Stroke Maternal Grandmother   . Alzheimer's disease Maternal Grandfather   . Hypertension Daughter     ALLERGIES:  is allergic to losartan and neomycin sulfate.  MEDICATIONS:  Current Outpatient Medications  Medication Sig Dispense Refill  . amLODipine (NORVASC) 10 MG tablet Take 1 tablet (10 mg total) by mouth once nightly at bedtime. (Patient not taking: Reported on 05/01/2022) 30 tablet 4  . aspirin EC 81 MG tablet Take 81 mg by mouth every 4 (four) hours as needed for moderate pain. Swallow whole. (Patient not taking: Reported on 05/01/2022)    . buPROPion (WELLBUTRIN SR) 150 MG 12 hr tablet Take 1 tablet (150 mg total) by mouth 2 (two) times daily. 60 tablet 2  .  clobetasol cream (TEMOVATE) 5.40 % Apply 1 Application topically 2 (two) times daily. (Patient not taking: Reported on 05/01/2022) 30 g 0  . cyclobenzaprine (FLEXERIL) 10 MG tablet Take 1 tablet (10 mg total) by mouth 2 (two) times daily as needed for muscle spasms. (Patient not taking: Reported on 05/01/2022) 20 tablet 0  . diclofenac Sodium (VOLTAREN) 1 % GEL Apply 2 grams topically 4 (four) times daily. (Patient not taking: Reported on 05/01/2022) 100 g 1  . gadopiclenol (VUEWAY) 0.5 MMOL/ML solution Inject 10 mLs into the vein once as needed for up to 1 dose for contrast. (Patient not taking: Reported on 05/01/2022) 10 mL 0  . labetalol (NORMODYNE) 100 MG tablet Take 1 tablet (100 mg  total) by mouth 2 (two) times daily. 60 tablet 4  . meloxicam (MOBIC) 7.5 MG tablet Take 1 tablet (7.5 mg total) by mouth 2 (two) times daily as needed for pain. (Patient not taking: Reported on 05/01/2022) 30 tablet 2  . nicotine (NICODERM CQ - DOSED IN MG/24 HOURS) 21 mg/24hr patch Place 1 patch (21 mg total) onto the skin daily. (Patient not taking: Reported on 05/01/2022) 28 patch 1  . sertraline (ZOLOFT) 25 MG tablet Take 1 tablet (25 mg total) by mouth daily. 30 tablet 5  . spironolactone (ALDACTONE) 25 MG tablet Take 0.5 tablets (12.5 mg total) by mouth daily. 30 tablet 6  . tazarotene (TAZORAC) 0.05 % gel Apply topically at bedtime. (Patient not taking: Reported on 05/01/2022) 30 g 0  . traMADol (ULTRAM) 50 MG tablet Take 1 tablet (50 mg total) by mouth every 12 (twelve) hours as needed. 30 tablet 0  . triamcinolone cream (KENALOG) 0.1 % Apply 1 application topically 2 (two) times daily. (Patient not taking: Reported on 05/01/2022) 30 g 2   No current facility-administered medications for this visit.    PHYSICAL EXAMINATION:  ECOG PERFORMANCE STATUS: {CHL ONC ECOG PS:[id number]}   There were no vitals filed for this visit.  There were no vitals filed for this visit.   Physical Exam Vitals and  nursing note reviewed.  Constitutional:      General: She is not in acute distress.    Appearance: Normal appearance. She is obese. She is not ill-appearing, toxic-appearing or diaphoretic.     Comments: Here alone  HENT:     Head: Normocephalic and atraumatic.     Right Ear: External ear normal.     Left Ear: External ear normal.     Nose: Nose normal. No congestion or rhinorrhea.  Eyes:     General: No scleral icterus.    Extraocular Movements: Extraocular movements intact.     Conjunctiva/sclera: Conjunctivae normal.     Pupils: Pupils are equal, round, and reactive to light.  Cardiovascular:     Rate and Rhythm: Normal rate and regular rhythm.     Heart sounds: Normal heart sounds. No murmur heard.    No friction rub. No gallop.  Pulmonary:     Effort: Pulmonary effort is normal. No respiratory distress.     Breath sounds: Normal breath sounds. No stridor. No wheezing or rhonchi.  Abdominal:     General: Bowel sounds are normal. There is no distension.     Palpations: Abdomen is soft.     Tenderness: There is no abdominal tenderness. There is no guarding.  Musculoskeletal:        General: No swelling, tenderness or deformity.     Cervical back: Normal range of motion and neck supple. No rigidity or tenderness.  Lymphadenopathy:     Head:     Right side of head: No submental, submandibular, tonsillar, preauricular, posterior auricular or occipital adenopathy.     Left side of head: No submental, submandibular, tonsillar, preauricular, posterior auricular or occipital adenopathy.     Cervical: No cervical adenopathy.     Right cervical: No superficial, deep or posterior cervical adenopathy.    Left cervical: No superficial, deep or posterior cervical adenopathy.     Upper Body:     Right upper body: No supraclavicular, axillary, pectoral or epitrochlear adenopathy.     Left upper body: No supraclavicular, axillary, pectoral or epitrochlear adenopathy.  Skin:    General: Skin  is warm.  Coloration: Skin is not jaundiced or pale.     Findings: No bruising or erythema.  Neurological:     General: No focal deficit present.     Mental Status: She is alert and oriented to person, place, and time.     Cranial Nerves: No cranial nerve deficit.     Motor: No weakness.  Psychiatric:        Mood and Affect: Mood normal.        Behavior: Behavior normal.        Thought Content: Thought content normal.        Judgment: Judgment normal.    LABORATORY DATA: I have personally reviewed the data as listed:  Appointment on 05/10/2022  Component Date Value Ref Range Status  . Creatinine, Urine 05/10/2022 697.0  Not Estab. mg/dL Final   Comment: Results confirmed on dilution.   Leisa Lenz, Urine 05/10/2022 115.8  Not Estab. ug/mL Final  . Microalb/Creat Ratio 05/10/2022 17  0 - 29 mg/g creat Final   Comment:                        Normal:                0 -  29                        Moderately increased: 30 - 300                        Severely increased:       >300   . Glucose 05/10/2022 98  70 - 99 mg/dL Final  . BUN 05/10/2022 17  6 - 24 mg/dL Final  . Creatinine, Ser 05/10/2022 1.67 (H)  0.57 - 1.00 mg/dL Final  . eGFR 05/10/2022 36 (L)  >59 mL/min/1.73 Final  . BUN/Creatinine Ratio 05/10/2022 10  9 - 23 Final  . Sodium 05/10/2022 142  134 - 144 mmol/L Final  . Potassium 05/10/2022 4.1  3.5 - 5.2 mmol/L Final  . Chloride 05/10/2022 99  96 - 106 mmol/L Final  . CO2 05/10/2022 26  20 - 29 mmol/L Final  . Calcium 05/10/2022 9.6  8.7 - 10.2 mg/dL Final  Office Visit on 05/01/2022  Component Date Value Ref Range Status  . WBC 05/01/2022 11.2 (H)  3.4 - 10.8 x10E3/uL Final  . RBC 05/01/2022 6.08 (H)  3.77 - 5.28 x10E6/uL Final  . Hemoglobin 05/01/2022 14.9  11.1 - 15.9 g/dL Final  . Hematocrit 05/01/2022 47.3 (H)  34.0 - 46.6 % Final  . MCV 05/01/2022 78 (L)  79 - 97 fL Final  . MCH 05/01/2022 24.5 (L)  26.6 - 33.0 pg Final  . MCHC 05/01/2022 31.5  31.5 -  35.7 g/dL Final  . RDW 05/01/2022 13.4  11.7 - 15.4 % Final  . Platelets 05/01/2022 299  150 - 450 x10E3/uL Final  . Glucose 05/01/2022 93  70 - 99 mg/dL Final  . BUN 05/01/2022 15  6 - 24 mg/dL Final  . Creatinine, Ser 05/01/2022 1.46 (H)  0.57 - 1.00 mg/dL Final  . eGFR 05/01/2022 42 (L)  >59 mL/min/1.73 Final  . BUN/Creatinine Ratio 05/01/2022 10  9 - 23 Final  . Sodium 05/01/2022 140  134 - 144 mmol/L Final  . Potassium 05/01/2022 5.3 (H)  3.5 - 5.2 mmol/L Final  . Chloride 05/01/2022 98  96 - 106 mmol/L Final  .  CO2 05/01/2022 23  20 - 29 mmol/L Final  . Calcium 05/01/2022 9.6  8.7 - 10.2 mg/dL Final  . Total Protein 05/01/2022 7.8  6.0 - 8.5 g/dL Final  . Albumin 05/01/2022 4.1  3.8 - 4.9 g/dL Final  . Globulin, Total 05/01/2022 3.7  1.5 - 4.5 g/dL Final  . Albumin/Globulin Ratio 05/01/2022 1.1 (L)  1.2 - 2.2 Final  . Bilirubin Total 05/01/2022 0.7  0.0 - 1.2 mg/dL Final  . Alkaline Phosphatase 05/01/2022 152 (H)  44 - 121 IU/L Final  . AST 05/01/2022 21  0 - 40 IU/L Final  . ALT 05/01/2022 9  0 - 32 IU/L Final  . Cholesterol, Total 05/01/2022 181  100 - 199 mg/dL Final  . Triglycerides 05/01/2022 141  0 - 149 mg/dL Final  . HDL 05/01/2022 50  >39 mg/dL Final  . VLDL Cholesterol Cal 05/01/2022 25  5 - 40 mg/dL Final  . LDL Chol Calc (NIH) 05/01/2022 106 (H)  0 - 99 mg/dL Final  . Chol/HDL Ratio 05/01/2022 3.6  0.0 - 4.4 ratio Final   Comment:                                   T. Chol/HDL Ratio                                             Men  Women                               1/2 Avg.Risk  3.4    3.3                                   Avg.Risk  5.0    4.4                                2X Avg.Risk  9.6    7.1                                3X Avg.Risk 23.4   11.0     RADIOGRAPHIC STUDIES: I have personally reviewed the radiological images as listed and agree with the findings in the report  No results found.  ASSESSMENT/PLAN Cancer Staging  No matching staging  information was found for the patient.   No problem-specific Assessment & Plan notes found for this encounter.   No orders of the defined types were placed in this encounter.    minutes was spent in patient care.  This included time spent preparing to see the patient (e.g., review of tests), obtaining and/or reviewing separately obtained history, counseling and educating the patient/family/caregiver, ordering medications, tests, or procedures; documenting clinical information in the electronic or other health record, independently interpreting results and communicating results to the patient/family/caregiver as well as coordination of care.       All questions were answered. The patient knows to call the clinic with any problems, questions or concerns.  This note was electronically signed.    Barbee Cough, MD  05/26/2022 12:11 PM

## 2022-05-27 LAB — ANA COMPREHENSIVE PANEL

## 2022-05-27 LAB — TESTOSTERONE: Testosterone: 20 ng/dL (ref 4–50)

## 2022-05-27 LAB — ERYTHROPOIETIN: Erythropoietin: 4.3 m[IU]/mL (ref 2.6–18.5)

## 2022-05-30 LAB — MISC LABCORP TEST (SEND OUT): Labcorp test code: 71506

## 2022-05-30 LAB — KAPPA/LAMBDA LIGHT CHAINS
Kappa free light chain: 84.8 mg/L — ABNORMAL HIGH (ref 3.3–19.4)
Kappa, lambda light chain ratio: 1.63 (ref 0.26–1.65)
Lambda free light chains: 51.9 mg/L — ABNORMAL HIGH (ref 5.7–26.3)

## 2022-05-30 LAB — METHEMOGLOBIN, BLOOD: Methemoglobin, Blood: 1.1 % (ref 0.4–1.5)

## 2022-05-30 LAB — CARBON MONOXIDE, BLOOD (PERFORMED AT REF LAB): Carbon Monoxide, Blood: 5.1 % — ABNORMAL HIGH (ref 0.0–3.6)

## 2022-05-31 LAB — HGB FRACTIONATION CASCADE
Hgb A2: 2.1 % (ref 1.8–3.2)
Hgb A: 97.9 % (ref 96.4–98.8)
Hgb F: 0 % (ref 0.0–2.0)
Hgb S: 0 %

## 2022-05-31 LAB — JAK2 (INCLUDING V617F AND EXON 12), MPL,& CALR-NEXT GEN SEQ

## 2022-06-01 ENCOUNTER — Telehealth: Payer: Self-pay | Admitting: Oncology

## 2022-06-01 DIAGNOSIS — G4733 Obstructive sleep apnea (adult) (pediatric): Secondary | ICD-10-CM

## 2022-06-01 HISTORY — DX: Obstructive sleep apnea (adult) (pediatric): G47.33

## 2022-06-01 LAB — MULTIPLE MYELOMA PANEL, SERUM
Albumin SerPl Elph-Mcnc: 3.4 g/dL (ref 2.9–4.4)
Albumin/Glob SerPl: 1 (ref 0.7–1.7)
Alpha 1: 0.2 g/dL (ref 0.0–0.4)
Alpha2 Glob SerPl Elph-Mcnc: 0.8 g/dL (ref 0.4–1.0)
B-Globulin SerPl Elph-Mcnc: 1.1 g/dL (ref 0.7–1.3)
Gamma Glob SerPl Elph-Mcnc: 1.6 g/dL (ref 0.4–1.8)
Globulin, Total: 3.7 g/dL (ref 2.2–3.9)
IgA: 442 mg/dL — ABNORMAL HIGH (ref 87–352)
IgG (Immunoglobin G), Serum: 1740 mg/dL — ABNORMAL HIGH (ref 586–1602)
IgM (Immunoglobulin M), Srm: 61 mg/dL (ref 26–217)
Total Protein ELP: 7.1 g/dL (ref 6.0–8.5)

## 2022-06-01 NOTE — Telephone Encounter (Signed)
Left patient a voicemail regarding upcoming appointments  

## 2022-06-02 ENCOUNTER — Telehealth: Payer: Self-pay | Admitting: Internal Medicine

## 2022-06-02 ENCOUNTER — Ambulatory Visit: Payer: BLUE CROSS/BLUE SHIELD | Admitting: Pharmacist

## 2022-06-02 NOTE — Telephone Encounter (Signed)
Pt is calling to reschedule BP check with [facility] Please advise CB- [phone]

## 2022-06-06 NOTE — Telephone Encounter (Signed)
Can we reschedule this patient's BP check with me?

## 2022-06-15 ENCOUNTER — Encounter: Payer: Medicaid Other | Admitting: Dietician

## 2022-06-20 ENCOUNTER — Other Ambulatory Visit: Payer: Self-pay

## 2022-06-27 ENCOUNTER — Other Ambulatory Visit: Payer: Self-pay

## 2022-06-28 ENCOUNTER — Other Ambulatory Visit: Payer: Self-pay

## 2022-06-29 ENCOUNTER — Other Ambulatory Visit: Payer: Self-pay

## 2022-06-29 DIAGNOSIS — D751 Secondary polycythemia: Secondary | ICD-10-CM

## 2022-06-30 ENCOUNTER — Other Ambulatory Visit: Payer: Self-pay

## 2022-06-30 ENCOUNTER — Inpatient Hospital Stay: Payer: Commercial Managed Care - HMO

## 2022-06-30 ENCOUNTER — Inpatient Hospital Stay: Payer: Commercial Managed Care - HMO | Attending: Oncology

## 2022-06-30 ENCOUNTER — Encounter: Payer: Self-pay | Admitting: Oncology

## 2022-06-30 ENCOUNTER — Inpatient Hospital Stay (HOSPITAL_BASED_OUTPATIENT_CLINIC_OR_DEPARTMENT_OTHER): Payer: Commercial Managed Care - HMO | Admitting: Oncology

## 2022-06-30 ENCOUNTER — Telehealth: Payer: Self-pay | Admitting: Oncology

## 2022-06-30 VITALS — BP 213/113 | HR 75 | Temp 97.9°F | Resp 18 | Ht 60.0 in | Wt 233.3 lb

## 2022-06-30 DIAGNOSIS — F17210 Nicotine dependence, cigarettes, uncomplicated: Secondary | ICD-10-CM | POA: Diagnosis not present

## 2022-06-30 DIAGNOSIS — D751 Secondary polycythemia: Secondary | ICD-10-CM | POA: Insufficient documentation

## 2022-06-30 DIAGNOSIS — Z800 Family history of malignant neoplasm of digestive organs: Secondary | ICD-10-CM | POA: Insufficient documentation

## 2022-06-30 DIAGNOSIS — D72829 Elevated white blood cell count, unspecified: Secondary | ICD-10-CM

## 2022-06-30 DIAGNOSIS — I129 Hypertensive chronic kidney disease with stage 1 through stage 4 chronic kidney disease, or unspecified chronic kidney disease: Secondary | ICD-10-CM | POA: Insufficient documentation

## 2022-06-30 DIAGNOSIS — K219 Gastro-esophageal reflux disease without esophagitis: Secondary | ICD-10-CM

## 2022-06-30 DIAGNOSIS — Z91148 Patient's other noncompliance with medication regimen for other reason: Secondary | ICD-10-CM

## 2022-06-30 DIAGNOSIS — G4733 Obstructive sleep apnea (adult) (pediatric): Secondary | ICD-10-CM

## 2022-06-30 DIAGNOSIS — I160 Hypertensive urgency: Secondary | ICD-10-CM

## 2022-06-30 DIAGNOSIS — D72828 Other elevated white blood cell count: Secondary | ICD-10-CM

## 2022-06-30 DIAGNOSIS — N189 Chronic kidney disease, unspecified: Secondary | ICD-10-CM | POA: Diagnosis not present

## 2022-06-30 DIAGNOSIS — E662 Morbid (severe) obesity with alveolar hypoventilation: Secondary | ICD-10-CM | POA: Diagnosis not present

## 2022-06-30 LAB — CBC WITH DIFFERENTIAL (CANCER CENTER ONLY)
Abs Immature Granulocytes: 0.03 10*3/uL (ref 0.00–0.07)
Basophils Absolute: 0.1 10*3/uL (ref 0.0–0.1)
Basophils Relative: 1 %
Eosinophils Absolute: 0.2 10*3/uL (ref 0.0–0.5)
Eosinophils Relative: 2 %
HCT: 39.5 % (ref 36.0–46.0)
Hemoglobin: 12.3 g/dL (ref 12.0–15.0)
Immature Granulocytes: 0 %
Lymphocytes Relative: 26 %
Lymphs Abs: 3.2 10*3/uL (ref 0.7–4.0)
MCH: 24 pg — ABNORMAL LOW (ref 26.0–34.0)
MCHC: 31.1 g/dL (ref 30.0–36.0)
MCV: 77.1 fL — ABNORMAL LOW (ref 80.0–100.0)
Monocytes Absolute: 0.9 10*3/uL (ref 0.1–1.0)
Monocytes Relative: 7 %
Neutro Abs: 7.8 10*3/uL — ABNORMAL HIGH (ref 1.7–7.7)
Neutrophils Relative %: 64 %
Platelet Count: 261 10*3/uL (ref 150–400)
RBC: 5.12 MIL/uL — ABNORMAL HIGH (ref 3.87–5.11)
RDW: 14.6 % (ref 11.5–15.5)
WBC Count: 12.2 10*3/uL — ABNORMAL HIGH (ref 4.0–10.5)
nRBC: 0 % (ref 0.0–0.2)

## 2022-06-30 LAB — FERRITIN: Ferritin: 85 ng/mL (ref 11–307)

## 2022-06-30 NOTE — Telephone Encounter (Signed)
Scheduled per Inbasket message, patient has been called and voicemail was left.

## 2022-06-30 NOTE — Patient Instructions (Signed)
Your blood pressure was very high today.  Please go to urgent care of the emergency room to address.  Please take your blood pressure medications every day

## 2022-06-30 NOTE — Progress Notes (Signed)
[facility]  Visit:  Patient Care Team: Ladell Pier, MD as PCP - General (Internal Medicine) Pieter Partridge, DO as Consulting Physician (Neurology) Lavonna Monarch, MD (Inactive) as Consulting Physician (Dermatology) Barbee Cough, MD as Attending Physician (Hematology)  CHIEF COMPLAINTS/PURPOSE OF CONSULTATION:  HISTORY OF PRESENTING ILLNESS: Janice [name] 57 y.o. female is here because of polycythemia and granulocytosis Medical history notable for GERD, hypertension, morbid obesity, chronic kidney disease   Oct 13, 2021: MRI of brain shows probable mild to moderate chronic microvascular ischemic changes April 14, 2022 WBC 10.9 hemoglobin 14.0 MCV 76 platelet count 335; 69 segs  May 01, 2022 CMP notable for creatinine 1.46 potassium 5.3 alk phos 152  May 22, 2022: Nephrology consult placed  May 26, 2022: [facility]  Snores loud enough to knock paint off walls  Social:  Single.  Has worked in Engineer, maintenance in a factory.  Tobacco began smoking cigarettes at 81 yoa, smokes 1/3 ppd.  EtOH wine cooler 2 to 3 times a week  [facility] Mother died 47 HIV Father died 34 pancreatic cancer Brother alive 22 well  Sister died 28 MVA  WBC 10.5 hemoglobin 13.2 MCV 77 platelet count 289; 72 segs 20 lymphs 7 monos 1 EO 1 basophil. Reticulocyte count 1.1% Erythropoietin  4.3 Hemoglobin electrophoresis normal.  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  This variant has not been reported as somatic variant in tumors (cosmetic) it has been observed is an extremely rare population variant and publicly available databases.  Due to the positive functional and clinical evidence it significance is currently unclear.  It exhibits an frequency close to 50% raising possibility of germline origin and has been documented in the Clin VAR germline public database  SPEP with IEP  demonstrated polyclonal gammopathy.  Serum free kappa 84.8 lambda 51.9 with a kappa lambda 1.63 IgG 1740 IgG a 442 IgM 61 ANA panel negative CRP undetectable.  Sed rate 25 Cortisol 9.1 testosterone 20 TSH/free T41.610/0.54 Carbon monoxide 5.1% (upper limit normal 3.6%) Cobalt undetectable Ferritin 83 CMP notable for creatinine 1.67  June 30 2022:  Scheduled follow up for management of polycythemia and granulocytosis.  Ran out of BP meds a few days ago because she couldn't afford them.  Now having HA's.  Recommended that patient go to ED or urgent care to address.  To do sleep study July 04 2022.  Snores real loud.  Stopped smoking 2 weeks ago.    Review of Systems  Constitutional:  Positive for fatigue. Negative for appetite change, chills, fever and unexpected weight change.  HENT:   Positive for tinnitus. Negative for lump/mass, mouth sores, nosebleeds, sore throat, trouble swallowing and voice change.   Eyes:  Negative for eye problems and icterus.       Vision changes:  None  Respiratory:  Negative for chest tightness, cough, hemoptysis and wheezing.        DOE while sweeping and mopping  Cardiovascular:  Positive for leg swelling and palpitations.       Chest pain at night when tries to go to sleep.  Has also experienced it at rest.  Helped by taking a few deep breaths.  Lasts 5 minutes.  Has been going on for 6 months and not getting worse.    Gastrointestinal:  Positive for constipation and nausea. Negative for abdominal pain, blood in stool, diarrhea and vomiting.  Endocrine: Negative for  hot flashes.       Cold intolerance:  none Heat intolerance:  none  Genitourinary:  Negative for bladder incontinence, difficulty urinating, dysuria, frequency, hematuria and nocturia.   Musculoskeletal:  Positive for gait problem. Negative for arthralgias, back pain, myalgias, neck pain and neck stiffness.  Skin:  Negative for itching, rash and wound.  Neurological:  Positive for dizziness  and gait problem. Negative for headaches, light-headedness, numbness and speech difficulty.       Has had a few missteps but no falls  Hematological:  Negative for adenopathy. Bruises/bleeds easily.  Psychiatric/Behavioral:  Negative for suicidal ideas. The patient is not nervous/anxious.        Hard to get to sleep at night    MEDICAL HISTORY: Past Medical History:  Diagnosis Date   Depression    history of   GERD (gastroesophageal reflux disease)    acid reflux at night and with spicy foods   Hypertension    Morbid obesity with BMI of 40.0-44.9, adult (HCC)     SURGICAL HISTORY: Past Surgical History:  Procedure Laterality Date   CESAREAN SECTION     x2    SOCIAL HISTORY: Social History   Socioeconomic History   Marital status: Single    Spouse name: Not on file   Number of children: 3   Years of education: Not on file   Highest education level: Not on file  Occupational History   Not on file  Tobacco Use   Smoking status: Every Day    Packs/day: 0.25    Years: 40.00    Total pack years: 10.00    Types: Cigarettes   Smokeless tobacco: Never   Tobacco comments:    Wants to quit in 2021  Vaping Use   Vaping Use: Never used  Substance and Sexual Activity   Alcohol use: Yes    Comment: occ, 2 drinks   Drug use: No   Sexual activity: Yes    Birth control/protection: Condom  Other Topics Concern   Not on file  Social History Narrative   Not on file   Social Determinants of Health   Financial Resource Strain: Not on file  Food Insecurity: Food Insecurity Present (04/04/2022)   Hunger Vital Sign    Worried About Running Out of Food in the Last Year: Sometimes true    Ran Out of Food in the Last Year: Sometimes true  Transportation Needs: Not on file  Physical Activity: Insufficiently Active (03/06/2019)   Exercise Vital Sign    Days of Exercise per Week: 1 day    Minutes of Exercise per Session: 30 min  Stress: No Stress Concern Present (03/06/2019)    [place]    Feeling of Stress : Only a little  Social Connections: Not on file  Intimate Partner Violence: Unknown (03/06/2019)   Humiliation, Afraid, Rape, and Kick questionnaire    Fear of Current or Ex-Partner: Patient refused    Emotionally Abused: Patient refused    Physically Abused: Patient refused    Sexually Abused: Patient refused    FAMILY HISTORY Family History  Problem Relation Age of Onset   Hypertension Mother    Kidney disease Mother    Alcohol abuse Father    Depression Father    Drug abuse Father    Hypertension Father    Hypertension Maternal Grandmother    Stroke Maternal Grandmother    Alzheimer's disease Maternal Grandfather    Hypertension Daughter  ALLERGIES:  is allergic to losartan and neomycin sulfate.  MEDICATIONS:  Current Outpatient Medications  Medication Sig Dispense Refill   amLODipine (NORVASC) 10 MG tablet Take 1 tablet (10 mg total) by mouth once nightly at bedtime. 30 tablet 4   aspirin EC 81 MG tablet Take 81 mg by mouth every 4 (four) hours as needed for moderate pain. Swallow whole.     buPROPion (WELLBUTRIN SR) 150 MG 12 hr tablet Take 1 tablet (150 mg total) by mouth 2 (two) times daily. 60 tablet 2   clobetasol cream (TEMOVATE) AB-123456789 % Apply 1 Application topically 2 (two) times daily. 30 g 0   cyclobenzaprine (FLEXERIL) 10 MG tablet Take 1 tablet (10 mg total) by mouth 2 (two) times daily as needed for muscle spasms. 20 tablet 0   diclofenac Sodium (VOLTAREN) 1 % GEL Apply 2 grams topically 4 (four) times daily. 100 g 1   gadopiclenol (VUEWAY) 0.5 MMOL/ML solution Inject 10 mLs into the vein once as needed for up to 1 dose for contrast. 10 mL 0   labetalol (NORMODYNE) 100 MG tablet Take 1 tablet (100 mg total) by mouth 2 (two) times daily. 60 tablet 4   meloxicam (MOBIC) 7.5 MG tablet Take 1 tablet (7.5 mg total) by mouth 2 (two) times daily as needed for pain. 30  tablet 2   sertraline (ZOLOFT) 25 MG tablet Take 1 tablet (25 mg total) by mouth daily. 30 tablet 5   spironolactone (ALDACTONE) 25 MG tablet Take 0.5 tablets (12.5 mg total) by mouth daily. 30 tablet 6   tazarotene (TAZORAC) 0.05 % gel Apply topically at bedtime. 30 g 0   traMADol (ULTRAM) 50 MG tablet Take 1 tablet (50 mg total) by mouth every 12 (twelve) hours as needed. 30 tablet 0   triamcinolone cream (KENALOG) 0.1 % Apply 1 application topically 2 (two) times daily. 30 g 2   No current facility-administered medications for this visit.    PHYSICAL EXAMINATION:  ECOG PERFORMANCE STATUS: 1 - Symptomatic but completely ambulatory   Vitals:   06/30/22 0908  BP: (!) 213/113  Pulse: 75  Resp: 18  Temp: 97.9 F (36.6 C)  SpO2: 98%    Filed Weights   06/30/22 0908  Weight: 233 lb 4.8 oz (105.8 kg)     Physical Exam Vitals and nursing note reviewed.  Constitutional:      General: She is not in acute distress.    Appearance: Normal appearance. She is obese. She is not ill-appearing, toxic-appearing or diaphoretic.     Comments: Here alone  HENT:     Head: Normocephalic and atraumatic.     Right Ear: External ear normal.     Left Ear: External ear normal.     Nose: Nose normal. No congestion or rhinorrhea.  Eyes:     General: No scleral icterus.    Extraocular Movements: Extraocular movements intact.     Conjunctiva/sclera: Conjunctivae normal.     Pupils: Pupils are equal, round, and reactive to light.  Cardiovascular:     Rate and Rhythm: Normal rate and regular rhythm.     Heart sounds: Normal heart sounds. No murmur heard.    No friction rub. No gallop.  Pulmonary:     Effort: Pulmonary effort is normal. No respiratory distress.     Breath sounds: Normal breath sounds. No stridor. No wheezing or rhonchi.  Abdominal:     General: Bowel sounds are normal. There is no distension.     Palpations:  Abdomen is soft.     Tenderness: There is no abdominal tenderness.  There is no guarding.  Musculoskeletal:        General: No swelling, tenderness or deformity.     Cervical back: Normal range of motion and neck supple. No rigidity or tenderness.  Lymphadenopathy:     Head:     Right side of head: No submental, submandibular, tonsillar, preauricular, posterior auricular or occipital adenopathy.     Left side of head: No submental, submandibular, tonsillar, preauricular, posterior auricular or occipital adenopathy.     Cervical: No cervical adenopathy.     Right cervical: No superficial, deep or posterior cervical adenopathy.    Left cervical: No superficial, deep or posterior cervical adenopathy.     Upper Body:     Right upper body: No supraclavicular, axillary, pectoral or epitrochlear adenopathy.     Left upper body: No supraclavicular, axillary, pectoral or epitrochlear adenopathy.  Skin:    General: Skin is warm.     Coloration: Skin is not jaundiced or pale.     Findings: No bruising or erythema.  Neurological:     General: No focal deficit present.     Mental Status: She is alert and oriented to person, place, and time.     Cranial Nerves: No cranial nerve deficit.     Motor: No weakness.  Psychiatric:        Mood and Affect: Mood normal.        Behavior: Behavior normal.        Thought Content: Thought content normal.        Judgment: Judgment normal.    LABORATORY DATA: I have personally reviewed the data as listed:  Appointment on 06/30/2022  Component Date Value Ref Range Status   WBC Count 06/30/2022 12.2 (H)  4.0 - 10.5 K/uL Final   RBC 06/30/2022 5.12 (H)  3.87 - 5.11 MIL/uL Final   Hemoglobin 06/30/2022 12.3  12.0 - 15.0 g/dL Final   HCT 06/30/2022 39.5  36.0 - 46.0 % Final   MCV 06/30/2022 77.1 (L)  80.0 - 100.0 fL Final   MCH 06/30/2022 24.0 (L)  26.0 - 34.0 pg Final   MCHC 06/30/2022 31.1  30.0 - 36.0 g/dL Final   RDW 06/30/2022 14.6  11.5 - 15.5 % Final   Platelet Count 06/30/2022 261  150 - 400 K/uL Final   nRBC  06/30/2022 0.0  0.0 - 0.2 % Final   Neutrophils Relative % 06/30/2022 64  % Final   Neutro Abs 06/30/2022 7.8 (H)  1.7 - 7.7 K/uL Final   Lymphocytes Relative 06/30/2022 26  % Final   Lymphs Abs 06/30/2022 3.2  0.7 - 4.0 K/uL Final   Monocytes Relative 06/30/2022 7  % Final   Monocytes Absolute 06/30/2022 0.9  0.1 - 1.0 K/uL Final   Eosinophils Relative 06/30/2022 2  % Final   Eosinophils Absolute 06/30/2022 0.2  0.0 - 0.5 K/uL Final   Basophils Relative 06/30/2022 1  % Final   Basophils Absolute 06/30/2022 0.1  0.0 - 0.1 K/uL Final   Immature Granulocytes 06/30/2022 0  % Final   Abs Immature Granulocytes 06/30/2022 0.03  0.00 - 0.07 K/uL Final   Performed at [facility], [place] [address], [place], [place] [address]    RADIOGRAPHIC STUDIES: I have personally reviewed the radiological images as listed and agree with the findings in the report  No results found.  ASSESSMENT/PLAN  57 y.o. female is here because  of polycythemia and granulocytosis.  Medical history notable for GERD, hypertension, morbid obesity, chronic kidney disease  Polycythemia In males Hgb >16.5 g/dL; Hct > 49% ; In females:   Hgb >16.0 g/dL;  Hct > 48% Relative or Spurious Polycythemia  Decreased Plasma volume Caffeine Smoking  G[place]bock Syndrome   Overfilling of blood collection tube      Absolute Polycythemia  Secondary Polycythemia     Acquired Disorders    Hypoxia     Pulmonary Disease     Cyanotic heart disease      VSD Eisenmenger syndrome     Hypoventilation syndromes       Sleep apnea Pickwickian syndrome     Cigarette smoking     Carbon Monoxide poisoning    Aberrant Epo Production     Tumors      Renal cell carcinoma Wilms tumor Hepatic carcinoma (hemangioblastoma) Pheochromocytoma Uterine leiomyomata Virilizing ovarian tumors     Miscellaneous      Renal and hepatic disorders Solitary renal cysts Polycystic kidney disease Renal artery  stenosis Hydronephrosis Viral hepatitis     Endocrine Disorders     Primary aldosteronism Pheochromocytoma Barteter syndrome   Congenital Polycythemias    Bisphosphosphoglycerate deficiency Congenital methemoglobinemia Chuvash polycythemia (von Hipple Lindau mutations) Prolyl hydroxylase mutations Hypoxia inducible factor gene mutations (EPAS1)  EGLN1 (loss of function mutation) EPO receptor mutation  Primary Polycythemia     Primary Congenital and Familial Polycythemia   Myeloproliferative disorders   Most likely causes in this patient are sleep apnea, obesity, cigarette smoking.  Wll await results of sleep study before ordering imaging.    Consider Cardiac ECHO, 24 hour urine for catecholamines and metanephrines. Hemoglobin O2 affinity, Imaging studies as needed.  Can later consider panel for hereditary erythrocytosis.      Possible causes of Neutrophilia in this patient  Spirochaetal Syphilis, Leptospirosis   Rickettsial Rocky [place] spotted fever   Chlamydial psittacosis   Protozoal Pneumocystis carinii infection   Mycotic actinomycosis, coccidioidomycosis  Acute Inflammation not caused by infection  RA, rheumatic fever, vasculitis, myositis, pancreatitis, hypersensitivity reactions.  Endocrine/ Metabolic  Obesity  Myeloproliferative neoplasms and myelodysplastic/myeloproliferative neoplasms    Malignant Diseases:   Carcinoma and solid tumors Lymphoma  Drugs  corticosteroids, Cigarette smoking,   Miscellaneous  paroxysmal tachycardia,    Most likely causes in this patient are obesity, smoking, topical corticosteroids.     CEBPA p.Pro 237Thr:  A variant of unknown clinical significance detected on NGS testing to evaluate for myeloproliferative disorder.   This variant has not been reported as somatic variant in tumors (cosmetic) it has been observed is an extremely rare population variant and publicly available databases.  Due to the positive functional and clinical  evidence it significance is currently unclear.  It exhibits an frequency close to 50% raising possibility of germline origin and has been documented in the Clin VAR germline public database May wish to consider a bone marrow bx if hematologic abnormalities persist if patient diagnosed and/or treated for sleep apnea  HTN:  Intermittently controlled.  Patient referred to ED today because of probable hypertensive urgency.  She is not consistantly compliant with medication which increases risk of adverse events such as MI, CVA, kidney disease    Cancer Staging  No matching staging information was found for the patient.   No problem-specific Assessment & Plan notes found for this encounter.   Orders Placed This Encounter  Procedures   bcr/abl-PCR    Standing Status:   Future  Standing Expiration Date:   07/01/2023   64  minutes was spent in patient care.  This included time spent preparing to see the patient (e.g., review of tests), obtaining and/or reviewing separately obtained history, counseling and educating the patient, ordering tests, or procedures; documenting clinical information in the electronic or other health record, independently interpreting results and communicating results to the patient as well as coordination of care.       All questions were answered. The patient knows to call the clinic with any problems, questions or concerns.  This note was electronically signed.    Loni Muse, MD  06/30/2022 9:23 AM

## 2022-07-02 LAB — RHEUMATOID FACTOR: Rheumatoid fact SerPl-aCnc: <10 IU/mL (ref <14.0)

## 2022-07-03 ENCOUNTER — Telehealth: Payer: Self-pay | Admitting: Oncology

## 2022-07-03 NOTE — Telephone Encounter (Signed)
Left vm regarding appointment  added 2/23

## 2022-07-04 ENCOUNTER — Ambulatory Visit (HOSPITAL_BASED_OUTPATIENT_CLINIC_OR_DEPARTMENT_OTHER): Payer: Commercial Managed Care - HMO | Attending: Oncology | Admitting: Internal Medicine

## 2022-07-04 DIAGNOSIS — R519 Headache, unspecified: Secondary | ICD-10-CM | POA: Diagnosis not present

## 2022-07-04 DIAGNOSIS — E669 Obesity, unspecified: Secondary | ICD-10-CM | POA: Diagnosis not present

## 2022-07-04 DIAGNOSIS — G4736 Sleep related hypoventilation in conditions classified elsewhere: Secondary | ICD-10-CM | POA: Insufficient documentation

## 2022-07-04 DIAGNOSIS — R5383 Other fatigue: Secondary | ICD-10-CM | POA: Diagnosis not present

## 2022-07-04 DIAGNOSIS — N1832 Chronic kidney disease, stage 3b: Secondary | ICD-10-CM | POA: Insufficient documentation

## 2022-07-04 DIAGNOSIS — R0683 Snoring: Secondary | ICD-10-CM | POA: Insufficient documentation

## 2022-07-04 DIAGNOSIS — G4733 Obstructive sleep apnea (adult) (pediatric): Secondary | ICD-10-CM | POA: Diagnosis not present

## 2022-07-04 DIAGNOSIS — G4763 Sleep related bruxism: Secondary | ICD-10-CM | POA: Insufficient documentation

## 2022-07-04 DIAGNOSIS — F32A Depression, unspecified: Secondary | ICD-10-CM | POA: Diagnosis not present

## 2022-07-04 DIAGNOSIS — D72829 Elevated white blood cell count, unspecified: Secondary | ICD-10-CM | POA: Diagnosis not present

## 2022-07-04 DIAGNOSIS — G4700 Insomnia, unspecified: Secondary | ICD-10-CM | POA: Insufficient documentation

## 2022-07-04 DIAGNOSIS — Z6841 Body Mass Index (BMI) 40.0 and over, adult: Secondary | ICD-10-CM | POA: Insufficient documentation

## 2022-07-04 DIAGNOSIS — I129 Hypertensive chronic kidney disease with stage 1 through stage 4 chronic kidney disease, or unspecified chronic kidney disease: Secondary | ICD-10-CM | POA: Diagnosis not present

## 2022-07-04 DIAGNOSIS — D751 Secondary polycythemia: Secondary | ICD-10-CM | POA: Diagnosis not present

## 2022-07-04 LAB — ANCA PROFILE
Anti-MPO Antibodies: <0.2 units (ref 0.0–0.9)
Anti-PR3 Antibodies: <0.2 units (ref 0.0–0.9)
Atypical P-ANCA titer: <1:20 {titer}
C-ANCA: <1:20 {titer}
P-ANCA: <1:20 {titer}

## 2022-07-05 ENCOUNTER — Other Ambulatory Visit: Payer: Self-pay

## 2022-07-05 ENCOUNTER — Ambulatory Visit
Admission: RE | Admit: 2022-07-05 | Discharge: 2022-07-05 | Disposition: A | Discharge status: Home / Self Care | Payer: Medicaid Other | Source: Ambulatory Visit | Attending: Internal Medicine | Admitting: Internal Medicine

## 2022-07-05 DIAGNOSIS — Z1231 Encounter for screening mammogram for malignant neoplasm of breast: Secondary | ICD-10-CM

## 2022-07-05 LAB — BCR ABL1 FISH (GENPATH)

## 2022-07-05 NOTE — Progress Notes (Deleted)
NEUROLOGY FOLLOW UP OFFICE NOTE  Janice [name] [id number]  Assessment/Plan:   New onset headaches - denies prior history of headaches.  Improved.  May have been related to uncontrolled blood pressure (now improved), medication overuse and depression/anxiety. Blurred vision Memory deficits - suspect related to anxiety and depression.  She has no family history of dementia and would be very rare to exhibit signs of cognitive impairment due to underlying neurodegenerative disease. Depression and anxiety     1  Continue propranolol ER '80mg'$  daily 2  Limit use of pain relievers to no more than 2 days out of week to prevent risk of rebound or medication-overuse headache. 3  She has upcoming appointment with the eye doctor 4  Recommend re-establishing with a psychotherapist and address further treatment for depression with her PCP 5  Check B12 and TSH 6  Follow up with me in 6 months.       Subjective:  Janice [name] is a 57 year old female with polycythemia, CKD stage 3b, morbid obesity and HTN who follows up for headache.   UPDATE: To further assess memory, B12 and TSH were checked in August, which were 632 and 0.96 respectively.     ***  Current NSAIDS/analgesics:  ASA '81mg'$  daily Current triptans:  none Current ergotamine:  none Current anti-emetic:  none Current muscle relaxants:  none Current Antihypertensive medications:  propranolol ER '80mg'$  daily, amlodipine Current Antidepressant medications:  Wellbutrin Current Anticonvulsant medications:  none Current anti-CGRP:  none Current Vitamins/Herbal/Supplements:  none Current Antihistamines/Decongestants:  none Other therapy:  none Hormone/birth control:  none   HISTORY:  She began experiencing persistent headache and visual disturbance in October 2022.  She described a 9/10 throbbing frontal/temporal headache with photophobia and some blurred vision, confusion, slurred speech, and off balance.  No nausea, vomiting,  double vision, visual obscurations, pulsatile tinnitus, numbness ane weakness.  Would wake her up out of her sleep.  She was seen in the ED on 3 occasions between 04/04/2021 and 04/14/2021.  Systolic blood pressure had been from 159 to 200s.  CT head on 04/04/2021 showed chronic small vessel ischemic changes.  Follow up MRV of head was negative for dural venous thrombosis or stenosis.  They started subsiding in December after she was started on daily topiramate and Fioricet, decreasing to 3-4/10 lasting 15-20 minutes occurring every 3 to 4 days (treats with ASA or ibuprofen daily).  She started taking Fioricet almost daily.  Still reports memory problems - gets lost driving on familiar routes or forgetting to take her medications.  Again, this started in October 2022.  Stopped topiramate which did not improve the memory.  To further evaluate ongoing blurred vision, she saw ophthalmology ***.  Blood pressure has still been uncontrolled.  No prior history of headaches.  Just prior to onset of headaches, the father of her children passed away which caused a great deal of emotional distress.  MRI of brain with and without contrast on 10/13/2021 showed mild-moderate chronic small vessel ischemic changes including gliosis along the splenium of the corpus callosum.       Past NSAIDS/analgesics:  naproxen, Fioricet Past abortive triptans:  none Past abortive ergotamine:  none Past muscle relaxants:  tizanidine Past anti-emetic:  none Past antihypertensive medications:  lisinopril, HCTZ, losartan Past antidepressant medications:  none Past anticonvulsant medications:  topiramate (thought it may have contributed to cognitive changes) Past anti-CGRP:  none Past vitamins/Herbal/Supplements:  none Past antihistamines/decongestants:  none Other past therapies:  none  PAST MEDICAL HISTORY: Past Medical History:  Diagnosis Date   Depression    history of   GERD (gastroesophageal reflux disease)    acid reflux  at night and with spicy foods   Hypertension    Morbid obesity with BMI of 40.0-44.9, adult (HCC)     MEDICATIONS: Current Outpatient Medications on File Prior to Visit  Medication Sig Dispense Refill   amLODipine (NORVASC) 10 MG tablet Take 1 tablet (10 mg total) by mouth once nightly at bedtime. 30 tablet 4   aspirin EC 81 MG tablet Take 81 mg by mouth every 4 (four) hours as needed for moderate pain. Swallow whole.     buPROPion (WELLBUTRIN SR) 150 MG 12 hr tablet Take 1 tablet (150 mg total) by mouth 2 (two) times daily. 60 tablet 2   clobetasol cream (TEMOVATE) [id number] % Apply 1 Application topically 2 (two) times daily. 30 g 0   cyclobenzaprine (FLEXERIL) 10 MG tablet Take 1 tablet (10 mg total) by mouth 2 (two) times daily as needed for muscle spasms. 20 tablet 0   diclofenac Sodium (VOLTAREN) 1 % GEL Apply 2 grams topically 4 (four) times daily. 100 g 1   gadopiclenol (VUEWAY) 0.5 MMOL/ML solution Inject 10 mLs into the vein once as needed for up to 1 dose for contrast. 10 mL 0   labetalol (NORMODYNE) 100 MG tablet Take 1 tablet (100 mg total) by mouth 2 (two) times daily. 60 tablet 4   meloxicam (MOBIC) 7.5 MG tablet Take 1 tablet (7.5 mg total) by mouth 2 (two) times daily as needed for pain. 30 tablet 2   sertraline (ZOLOFT) 25 MG tablet Take 1 tablet (25 mg total) by mouth daily. 30 tablet 5   spironolactone (ALDACTONE) 25 MG tablet Take 0.5 tablets (12.5 mg total) by mouth daily. 30 tablet 6   tazarotene (TAZORAC) 0.05 % gel Apply topically at bedtime. 30 g 0   traMADol (ULTRAM) 50 MG tablet Take 1 tablet (50 mg total) by mouth every 12 (twelve) hours as needed. 30 tablet 0   triamcinolone cream (KENALOG) 0.1 % Apply 1 application topically 2 (two) times daily. 30 g 2   [DISCONTINUED] propranolol ER (INDERAL LA) 80 MG 24 hr capsule Take 1 capsule (80 mg total) by mouth daily. 30 capsule 5   [DISCONTINUED] valsartan (DIOVAN) 40 MG tablet Take 1 tablet (40 mg total) by mouth daily. 30  tablet 3   No current facility-administered medications on file prior to visit.    ALLERGIES: Allergies  Allergen Reactions   Losartan     Chest pain   Neomycin Sulfate Swelling, Rash and Other (See Comments)    Pain in ear, swelling and redness    FAMILY HISTORY: Family History  Problem Relation Age of Onset   Hypertension Mother    Kidney disease Mother    Alcohol abuse Father    Depression Father    Drug abuse Father    Hypertension Father    Hypertension Maternal Grandmother    Stroke Maternal Grandmother    Alzheimer's disease Maternal Grandfather    Hypertension Daughter       Objective:  *** General: No acute distress.  Patient appears ***-groomed.   Head:  Normocephalic/atraumatic Eyes:  Fundi examined but not visualized Neck: supple, no paraspinal tenderness, full range of motion Heart:  Regular rate and rhythm Lungs:  Clear to auscultation bilaterally Back: No paraspinal tenderness Neurological Exam: alert and oriented to person, place, and time.  Speech fluent and  not dysarthric, language intact.  CN II-XII intact. Bulk and tone normal, muscle strength 5/5 throughout.  Sensation to light touch intact.  Deep tendon reflexes 2+ throughout, toes downgoing.  Finger to nose testing intact.  Gait normal, Romberg negative.   Metta Clines, DO  CC: ***

## 2022-07-06 ENCOUNTER — Encounter: Payer: Self-pay | Admitting: Neurology

## 2022-07-06 ENCOUNTER — Ambulatory Visit: Payer: Commercial Managed Care - HMO | Admitting: Neurology

## 2022-07-07 ENCOUNTER — Ambulatory Visit: Payer: BLUE CROSS/BLUE SHIELD | Admitting: Pharmacist

## 2022-07-07 LAB — ALDOSTERONE: Aldosterone: 11.4 ng/dL (ref 0.0–30.0)

## 2022-07-07 NOTE — Progress Notes (Unsigned)
NEUROLOGY FOLLOW UP OFFICE NOTE  Cortlynn Nagi Mincey YC:[id number]  Assessment/Plan:   New onset headaches - denies prior history of headaches.  Improved.  May have been related to uncontrolled blood pressure (now improved), medication overuse and depression/anxiety. Blurred vision Memory deficits - suspect related to anxiety and depression.  She has no family history of dementia and would be very rare to exhibit signs of cognitive impairment due to underlying neurodegenerative disease. Depression and anxiety     1  Continue propranolol ER '80mg'$  daily 2  Limit use of pain relievers to no more than 2 days out of week to prevent risk of rebound or medication-overuse headache. 3  She has upcoming appointment with the eye doctor 4  Recommend re-establishing with a psychotherapist and address further treatment for depression with her PCP 5  Check B12 and TSH 6  Follow up with me in 6 months.       Subjective:  Jaquesha Bottger Sabree is a 57 year old female with polycythemia, CKD stage 3b, morbid obesity and HTN who follows up for headache.   UPDATE: To further assess memory, B12 and TSH were checked in August, which were 632 and 0.96 respectively.     ***  Current NSAIDS/analgesics:  ASA '81mg'$  daily Current triptans:  none Current ergotamine:  none Current anti-emetic:  none Current muscle relaxants:  none Current Antihypertensive medications:  propranolol ER '80mg'$  daily, amlodipine Current Antidepressant medications:  Wellbutrin Current Anticonvulsant medications:  none Current anti-CGRP:  none Current Vitamins/Herbal/Supplements:  none Current Antihistamines/Decongestants:  none Other therapy:  none Hormone/birth control:  none   HISTORY:  She began experiencing persistent headache and visual disturbance in October 2022.  She described a 9/10 throbbing frontal/temporal headache with photophobia and some blurred vision, confusion, slurred speech, and off balance.  No nausea, vomiting,  double vision, visual obscurations, pulsatile tinnitus, numbness ane weakness.  Would wake her up out of her sleep.  She was seen in the ED on 3 occasions between 04/04/2021 and 04/14/2021.  Systolic blood pressure had been from 159 to 200s.  CT head on 04/04/2021 showed chronic small vessel ischemic changes.  Follow up MRV of head was negative for dural venous thrombosis or stenosis.  They started subsiding in December after she was started on daily topiramate and Fioricet, decreasing to 3-4/10 lasting 15-20 minutes occurring every 3 to 4 days (treats with ASA or ibuprofen daily).  She started taking Fioricet almost daily.  Still reports memory problems - gets lost driving on familiar routes or forgetting to take her medications.  Again, this started in October 2022.  Stopped topiramate which did not improve the memory.  To further evaluate ongoing blurred vision, she saw ophthalmology ***.  Blood pressure has still been uncontrolled.  No prior history of headaches.  Just prior to onset of headaches, the father of her children passed away which caused a great deal of emotional distress.  MRI of brain with and without contrast on 10/13/2021 showed mild-moderate chronic small vessel ischemic changes including gliosis along the splenium of the corpus callosum.       Past NSAIDS/analgesics:  naproxen, Fioricet Past abortive triptans:  none Past abortive ergotamine:  none Past muscle relaxants:  tizanidine Past anti-emetic:  none Past antihypertensive medications:  lisinopril, HCTZ, losartan Past antidepressant medications:  none Past anticonvulsant medications:  topiramate (thought it may have contributed to cognitive changes) Past anti-CGRP:  none Past vitamins/Herbal/Supplements:  none Past antihistamines/decongestants:  none Other past therapies:  none  PAST MEDICAL HISTORY: Past Medical History:  Diagnosis Date   Depression    history of   GERD (gastroesophageal reflux disease)    acid reflux  at night and with spicy foods   Hypertension    Morbid obesity with BMI of 40.0-44.9, adult (HCC)     MEDICATIONS: Current Outpatient Medications on File Prior to Visit  Medication Sig Dispense Refill   amLODipine (NORVASC) 10 MG tablet Take 1 tablet (10 mg total) by mouth once nightly at bedtime. 30 tablet 4   aspirin EC 81 MG tablet Take 81 mg by mouth every 4 (four) hours as needed for moderate pain. Swallow whole.     buPROPion (WELLBUTRIN SR) 150 MG 12 hr tablet Take 1 tablet (150 mg total) by mouth 2 (two) times daily. 60 tablet 2   clobetasol cream (TEMOVATE) [id number] % Apply 1 Application topically 2 (two) times daily. 30 g 0   cyclobenzaprine (FLEXERIL) 10 MG tablet Take 1 tablet (10 mg total) by mouth 2 (two) times daily as needed for muscle spasms. 20 tablet 0   diclofenac Sodium (VOLTAREN) 1 % GEL Apply 2 grams topically 4 (four) times daily. 100 g 1   gadopiclenol (VUEWAY) 0.5 MMOL/ML solution Inject 10 mLs into the vein once as needed for up to 1 dose for contrast. 10 mL 0   labetalol (NORMODYNE) 100 MG tablet Take 1 tablet (100 mg total) by mouth 2 (two) times daily. 60 tablet 4   meloxicam (MOBIC) 7.5 MG tablet Take 1 tablet (7.5 mg total) by mouth 2 (two) times daily as needed for pain. 30 tablet 2   sertraline (ZOLOFT) 25 MG tablet Take 1 tablet (25 mg total) by mouth daily. 30 tablet 5   spironolactone (ALDACTONE) 25 MG tablet Take 0.5 tablets (12.5 mg total) by mouth daily. 30 tablet 6   tazarotene (TAZORAC) 0.05 % gel Apply topically at bedtime. 30 g 0   traMADol (ULTRAM) 50 MG tablet Take 1 tablet (50 mg total) by mouth every 12 (twelve) hours as needed. 30 tablet 0   triamcinolone cream (KENALOG) 0.1 % Apply 1 application topically 2 (two) times daily. 30 g 2   [DISCONTINUED] propranolol ER (INDERAL LA) 80 MG 24 hr capsule Take 1 capsule (80 mg total) by mouth daily. 30 capsule 5   [DISCONTINUED] valsartan (DIOVAN) 40 MG tablet Take 1 tablet (40 mg total) by mouth daily. 30  tablet 3   No current facility-administered medications on file prior to visit.    ALLERGIES: Allergies  Allergen Reactions   Losartan     Chest pain   Neomycin Sulfate Swelling, Rash and Other (See Comments)    Pain in ear, swelling and redness    FAMILY HISTORY: Family History  Problem Relation Age of Onset   Hypertension Mother    Kidney disease Mother    Alcohol abuse Father    Depression Father    Drug abuse Father    Hypertension Father    Hypertension Maternal Grandmother    Stroke Maternal Grandmother    Alzheimer's disease Maternal Grandfather    Hypertension Daughter       Objective:  *** General: No acute distress.  Patient appears ***-groomed.   Head:  Normocephalic/atraumatic Eyes:  Fundi examined but not visualized Neck: supple, no paraspinal tenderness, full range of motion Heart:  Regular rate and rhythm Lungs:  Clear to auscultation bilaterally Back: No paraspinal tenderness Neurological Exam: alert and oriented to person, place, and time.  Speech fluent and  not dysarthric, language intact.  CN II-XII intact. Bulk and tone normal, muscle strength 5/5 throughout.  Sensation to light touch intact.  Deep tendon reflexes 2+ throughout, toes downgoing.  Finger to nose testing intact.  Gait normal, Romberg negative.   Metta Clines, DO  CC: Wanita Chamberlain, MD

## 2022-07-08 DIAGNOSIS — D751 Secondary polycythemia: Secondary | ICD-10-CM

## 2022-07-08 NOTE — Procedures (Signed)
Patient Name: Janice [name] Date: 07/04/2022 Gender: Female D.O.B: 1965/09/26 Age (years): 82 Referring Provider: Wanita Chamberlain MD Height (inches): 60 Interpreting Physician: Baird Lyons MD, ABSM Weight (lbs): 232 RPSGT: Laren Everts BMI: 45 MRN: [id number] Neck Size: 15.50  CLINICAL INFORMATION Sleep Study Type: NPSG Indication for sleep study: Depression, Fatigue, Hypertension, Insomnia, Morning Headaches, Non-refreshing Sleep, Obesity, Parasomnias, Restless Sleep with Limb Movements, Snoring Epworth Sleepiness Score: 21  SLEEP STUDY TECHNIQUE As per the AASM Manual for the Scoring of Sleep and Associated Events v2.3 (April 2016) with a hypopnea requiring 4% desaturations.  The channels recorded and monitored were frontal, central and occipital EEG, electrooculogram (EOG), submentalis EMG (chin), nasal and oral airflow, thoracic and abdominal wall motion, anterior tibialis EMG, snore microphone, electrocardiogram, and pulse oximetry.  MEDICATIONS Medications self-administered by patient taken the night of the study : AMLODIPINE, BUPROPION, LABETALOL  SLEEP ARCHITECTURE The study was initiated at 10:07:32 PM and ended at 4:39:44 AM.  Sleep onset time was 14.9 minutes and the sleep efficiency was 72.7%. The total sleep time was 285 minutes.  Stage REM latency was 222.5 minutes.  The patient spent 10.7% of the night in stage N1 sleep, 79.3% in stage N2 sleep, 0.0% in stage N3 and 10% in REM.  Alpha intrusion was absent.  Supine sleep was 0.53%.  RESPIRATORY PARAMETERS The overall apnea/hypopnea index (AHI) was 63.4 per hour. There were 8 total apneas, including 8 obstructive, 0 central and 0 mixed apneas. There were 293 hypopneas and 27 RERAs.  The AHI during Stage REM sleep was 103.2 per hour.  AHI while supine was 120.0 per hour.  The mean oxygen saturation was 89.7%. The minimum SpO2 during sleep was 66.0%.  moderate snoring was noted during  this study.  CARDIAC DATA The 2 lead EKG demonstrated sinus rhythm. The mean heart rate was 66.5 beats per minute. Other EKG findings include: None.  LEG MOVEMENT DATA The total PLMS were 0 with a resulting PLMS index of 0.0. Associated arousal with leg movement index was 0.0 .  IMPRESSIONS - Severe obstructive sleep apnea occurred during this study (AHI = 63.4/h). - Severe oxygen desaturation was noted during this study (Min O2 = 66.0%, Mean 89.7%). Time with O2 saturation 88% or less was 62.5 minutes. - The patient snored with moderate snoring volume. - No cardiac abnormalities were noted during this study. - Clinically significant periodic limb movements did not occur during sleep. No significant associated arousals. - Frequent bruxism.  DIAGNOSIS - Obstructive Sleep Apnea (G47.33) - Bruxism (G47.63) - Nocturnal Hypoxemia (G47.36)  RECOMMENDATIONS - Suggest return for CPAP titration sleep study. This will document control of apnea and provide insurance documentation if supplemental O2 will also be appropriate.. - Consider oral bite guard for bruxism - Be careful with alcohol, sedatives and other CNS depressants that may worsen sleep apnea and disrupt normal sleep architecture. - Sleep hygiene should be reviewed to assess factors that may improve sleep quality. - Weight management and regular exercise should be initiated or continued if appropriate.  [Electronically signed] 07/08/2022 12:03 PM  Baird Lyons MD, [place], American Board of Sleep Medicine NPI: [id number]                         [place], [facility]  ELECTRONICALLY SIGNED ON:  07/08/2022, 11:58 AM [place] PH: [phone]   FX: [phone]) [place]  MEDICINE

## 2022-07-10 ENCOUNTER — Ambulatory Visit (INDEPENDENT_AMBULATORY_CARE_PROVIDER_SITE_OTHER): Payer: Commercial Managed Care - HMO | Admitting: Neurology

## 2022-07-10 ENCOUNTER — Encounter: Payer: Self-pay | Admitting: Neurology

## 2022-07-10 VITALS — BP 156/95 | HR 72 | Ht 60.0 in | Wt 228.8 lb

## 2022-07-10 DIAGNOSIS — G43009 Migraine without aura, not intractable, without status migrainosus: Secondary | ICD-10-CM

## 2022-07-10 DIAGNOSIS — I160 Hypertensive urgency: Secondary | ICD-10-CM

## 2022-07-10 DIAGNOSIS — R413 Other amnesia: Secondary | ICD-10-CM | POA: Diagnosis not present

## 2022-07-10 DIAGNOSIS — Z91148 Patient's other noncompliance with medication regimen for other reason: Secondary | ICD-10-CM | POA: Insufficient documentation

## 2022-07-10 HISTORY — DX: Hypertensive urgency: I16.0

## 2022-07-10 MED ORDER — QULIPTA 60 MG PO TABS: 60.0000 mg | ORAL_TABLET | Freq: Every day | ORAL | 11 refills | Status: DC

## 2022-07-10 NOTE — Patient Instructions (Signed)
Start Qulipta 60mg  daily for migraine prevention At earliest onset of migraine, take [place].  May repeat in 2 hours (maximum 2 tablets in 24 hours).  Let me know if effective Will order neuropsychological evaluation Follow up 6 months.

## 2022-07-11 ENCOUNTER — Other Ambulatory Visit (HOSPITAL_COMMUNITY): Payer: Self-pay

## 2022-07-11 ENCOUNTER — Telehealth: Payer: Self-pay | Admitting: Pharmacy Technician

## 2022-07-11 NOTE — Telephone Encounter (Signed)
Patient Advocate Encounter  Prior Authorization for Qulipta 60MG  tablets has been approved.    PA# [id number] Key: [id number] Effective dates: 07/11/2022 through 07/11/2023      Lyndel Safe, [place] Patient Advocate Specialist [place] Patient Advocate Team Direct Number: [phone]  Fax: [phone]

## 2022-07-24 ENCOUNTER — Other Ambulatory Visit: Payer: Self-pay

## 2022-07-28 ENCOUNTER — Other Ambulatory Visit: Payer: Self-pay | Admitting: *Deleted

## 2022-07-28 ENCOUNTER — Other Ambulatory Visit: Payer: Self-pay

## 2022-07-28 ENCOUNTER — Encounter: Payer: Self-pay | Admitting: Oncology

## 2022-07-28 ENCOUNTER — Inpatient Hospital Stay: Payer: Commercial Managed Care - HMO

## 2022-07-28 ENCOUNTER — Inpatient Hospital Stay: Payer: Commercial Managed Care - HMO | Attending: Oncology | Admitting: Oncology

## 2022-07-28 VITALS — BP 156/98 | HR 66 | Temp 98.1°F | Resp 17 | Wt 227.1 lb

## 2022-07-28 DIAGNOSIS — Z800 Family history of malignant neoplasm of digestive organs: Secondary | ICD-10-CM | POA: Insufficient documentation

## 2022-07-28 DIAGNOSIS — N189 Chronic kidney disease, unspecified: Secondary | ICD-10-CM | POA: Insufficient documentation

## 2022-07-28 DIAGNOSIS — D751 Secondary polycythemia: Secondary | ICD-10-CM

## 2022-07-28 DIAGNOSIS — I160 Hypertensive urgency: Secondary | ICD-10-CM | POA: Diagnosis not present

## 2022-07-28 DIAGNOSIS — K219 Gastro-esophageal reflux disease without esophagitis: Secondary | ICD-10-CM | POA: Diagnosis not present

## 2022-07-28 DIAGNOSIS — D72829 Elevated white blood cell count, unspecified: Secondary | ICD-10-CM

## 2022-07-28 DIAGNOSIS — G4733 Obstructive sleep apnea (adult) (pediatric): Secondary | ICD-10-CM

## 2022-07-28 DIAGNOSIS — Z91148 Patient's other noncompliance with medication regimen for other reason: Secondary | ICD-10-CM

## 2022-07-28 DIAGNOSIS — I129 Hypertensive chronic kidney disease with stage 1 through stage 4 chronic kidney disease, or unspecified chronic kidney disease: Secondary | ICD-10-CM | POA: Diagnosis not present

## 2022-07-28 DIAGNOSIS — N1832 Chronic kidney disease, stage 3b: Secondary | ICD-10-CM

## 2022-07-28 DIAGNOSIS — Z87891 Personal history of nicotine dependence: Secondary | ICD-10-CM | POA: Diagnosis not present

## 2022-07-28 DIAGNOSIS — F17200 Nicotine dependence, unspecified, uncomplicated: Secondary | ICD-10-CM

## 2022-07-28 LAB — CBC WITH DIFFERENTIAL (CANCER CENTER ONLY)
Abs Immature Granulocytes: 0.03 10*3/uL (ref 0.00–0.07)
Basophils Absolute: 0 10*3/uL (ref 0.0–0.1)
Basophils Relative: 0 %
Eosinophils Absolute: 0.1 10*3/uL (ref 0.0–0.5)
Eosinophils Relative: 1 %
HCT: 41.2 % (ref 36.0–46.0)
Hemoglobin: 12.7 g/dL (ref 12.0–15.0)
Immature Granulocytes: 0 %
Lymphocytes Relative: 22 %
Lymphs Abs: 2.1 10*3/uL (ref 0.7–4.0)
MCH: 24.1 pg — ABNORMAL LOW (ref 26.0–34.0)
MCHC: 30.8 g/dL (ref 30.0–36.0)
MCV: 78 fL — ABNORMAL LOW (ref 80.0–100.0)
Monocytes Absolute: 0.6 10*3/uL (ref 0.1–1.0)
Monocytes Relative: 6 %
Neutro Abs: 6.7 10*3/uL (ref 1.7–7.7)
Neutrophils Relative %: 71 %
Platelet Count: 297 10*3/uL (ref 150–400)
RBC: 5.28 MIL/uL — ABNORMAL HIGH (ref 3.87–5.11)
RDW: 14.6 % (ref 11.5–15.5)
WBC Count: 9.6 10*3/uL (ref 4.0–10.5)
nRBC: 0 % (ref 0.0–0.2)

## 2022-07-28 LAB — FERRITIN: Ferritin: 96 ng/mL (ref 11–307)

## 2022-07-28 NOTE — Progress Notes (Signed)
[facility] up  Visit:  Patient Care Team: Ladell Pier, MD as PCP - General (Internal Medicine) Pieter Partridge, DO as Consulting Physician (Neurology) Lavonna Monarch, MD (Inactive) as Consulting Physician (Dermatology) Barbee Cough, MD as Attending Physician (Hematology)  CHIEF COMPLAINTS/PURPOSE OF CONSULTATION:  HISTORY OF PRESENTING ILLNESS: Janice [name] 57 y.o. female is here because of polycythemia and granulocytosis Medical history notable for GERD, hypertension, morbid obesity, chronic kidney disease   Oct 13, 2021: MRI of brain shows probable mild to moderate chronic microvascular ischemic changes April 14, 2022 WBC 10.9 hemoglobin 14.0 MCV 76 platelet count 335; 69 segs  May 01, 2022 CMP notable for creatinine 1.46 potassium 5.3 alk phos 152  May 22, 2022: Nephrology consult placed  May 26, 2022: [facility] Consult  Snores loud enough to knock paint off walls  Social:  Single.  Has worked in Engineer, maintenance in a factory.  Tobacco began smoking cigarettes at 41 yoa, smokes 1/3 ppd.  EtOH wine cooler 2 to 3 times a week  [facility] Mother died 43 HIV Father died 35 pancreatic cancer Brother alive 60 well  Sister died 72 MVA  WBC 10.5 hemoglobin 13.2 MCV 77 platelet count 289; 72 segs 20 lymphs 7 monos 1 EO 1 basophil. Reticulocyte count 1.1% Erythropoietin  4.3 Hemoglobin electrophoresis normal.  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  This variant has not been reported as somatic variant in tumors (cosmetic) it has been observed is an extremely rare population variant and publicly available databases.  Due to the positive functional and clinical evidence it significance is currently unclear.  It exhibits an frequency close to 50% raising possibility of germline origin and has been documented in the Clin VAR germline public database  SPEP with IEP  demonstrated polyclonal gammopathy.  Serum free kappa 84.8 lambda 51.9 with a kappa lambda 1.63 IgG 1740 IgG a 442 IgM 61 ANA panel negative CRP undetectable.  Sed rate 25 Cortisol 9.1 testosterone 20 TSH/free T41.610/0.54 Carbon monoxide 5.1% (upper limit normal 3.6%) Cobalt undetectable Ferritin 83 CMP notable for creatinine 1.67  June 30 2022:    Ran out of BP meds a few days ago because she couldn't afford them.  Now having HA's.  Recommended that patient go to ED or urgent care to address.  To do sleep study July 04 2022.  Snores real loud.  Stopped smoking 2 weeks ago.    FISH for bcr-abl negative   July 04 2022:  Sleep study.  Severe obstructive sleep apnea occurred during this study (AHI = 63.4/h).  Severe oxygen desaturation was noted during this study (Min O2 = 66.0%, Mean 89.7%). Time with O2 saturation 88% or less was 62.5 minutes.  July 28 2022:  Scheduled follow up for management of polycythemia and granulocytosis. Quit smoking about 3 weeks ago.  Not vaping.  Taking BP medications regularly.  BP still high.  Notes that she has daytime somnolence.    Will refer patient back for CPAP fitting  Review of Systems  Constitutional:  Positive for fatigue. Negative for appetite change, chills, fever and unexpected weight change.  HENT:   Positive for tinnitus. Negative for lump/mass, mouth sores, nosebleeds, sore throat, trouble swallowing and voice change.   Eyes:  Negative for eye problems and icterus.       Vision changes:  None  Respiratory:  Negative for chest tightness, cough, hemoptysis and wheezing.  DOE while sweeping and mopping  Cardiovascular:  Positive for leg swelling and palpitations.       Chest pain at night when tries to go to sleep.  Has also experienced it at rest.  Helped by taking a few deep breaths.  Lasts 5 minutes.  Has been going on for 6 months and not getting worse.    Gastrointestinal:  Positive for constipation and nausea. Negative  for abdominal pain, blood in stool, diarrhea and vomiting.  Endocrine: Negative for hot flashes.       Cold intolerance:  none Heat intolerance:  none  Genitourinary:  Negative for bladder incontinence, difficulty urinating, dysuria, frequency, hematuria and nocturia.   Musculoskeletal:  Positive for gait problem. Negative for arthralgias, back pain, myalgias, neck pain and neck stiffness.  Skin:  Negative for itching, rash and wound.  Neurological:  Positive for dizziness and gait problem. Negative for headaches, light-headedness, numbness and speech difficulty.       Has had a few missteps but no falls  Hematological:  Negative for adenopathy. Bruises/bleeds easily.  Psychiatric/Behavioral:  Negative for suicidal ideas. The patient is not nervous/anxious.        Hard to get to sleep at night    MEDICAL HISTORY: Past Medical History:  Diagnosis Date   Depression    history of   GERD (gastroesophageal reflux disease)    acid reflux at night and with spicy foods   Hypertension    Morbid obesity with BMI of 40.0-44.9, adult (HCC)     SURGICAL HISTORY: Past Surgical History:  Procedure Laterality Date   CESAREAN SECTION     x2    SOCIAL HISTORY: Social History   Socioeconomic History   Marital status: Single    Spouse name: Not on file   Number of children: 3   Years of education: Not on file   Highest education level: Not on file  Occupational History   Not on file  Tobacco Use   Smoking status: Every Day    Packs/day: 0.25    Years: 40.00    Total pack years: 10.00    Types: Cigarettes   Smokeless tobacco: Never   Tobacco comments:    Wants to quit in 2021  Vaping Use   Vaping Use: Never used  Substance and Sexual Activity   Alcohol use: Yes    Comment: occ, 2 drinks   Drug use: No   Sexual activity: Yes    Birth control/protection: Condom  Other Topics Concern   Not on file  Social History Narrative   Not on file   Social Determinants of Health    Financial Resource Strain: Not on file  Food Insecurity: Food Insecurity Present (04/04/2022)   Hunger Vital Sign    Worried About Running Out of Food in the Last Year: Sometimes true    Ran Out of Food in the Last Year: Sometimes true  Transportation Needs: Not on file  Physical Activity: Insufficiently Active (03/06/2019)   Exercise Vital Sign    Days of Exercise per Week: 1 day    Minutes of Exercise per Session: 30 min  Stress: No Stress Concern Present (03/06/2019)   [place]    Feeling of Stress : Only a little  Social Connections: Not on file  Intimate Partner Violence: Unknown (03/06/2019)   Humiliation, Afraid, Rape, and Kick questionnaire    Fear of Current or Ex-Partner: Patient refused    Emotionally Abused: Patient refused  Physically Abused: Patient refused    Sexually Abused: Patient refused    FAMILY HISTORY Family History  Problem Relation Age of Onset   Hypertension Mother    Kidney disease Mother    Alcohol abuse Father    Depression Father    Drug abuse Father    Hypertension Father    Hypertension Maternal Grandmother    Stroke Maternal Grandmother    Alzheimer's disease Maternal Grandfather    Hypertension Daughter     ALLERGIES:  is allergic to losartan and neomycin sulfate.  MEDICATIONS:  Current Outpatient Medications  Medication Sig Dispense Refill   amLODipine (NORVASC) 10 MG tablet Take 1 tablet (10 mg total) by mouth once nightly at bedtime. 30 tablet 4   aspirin EC 81 MG tablet Take 81 mg by mouth every 4 (four) hours as needed for moderate pain. Swallow whole.     Atogepant (QULIPTA) 60 MG TABS Take 1 tablet (60 mg total) by mouth daily. 30 tablet 11   buPROPion (WELLBUTRIN SR) 150 MG 12 hr tablet Take 1 tablet (150 mg total) by mouth 2 (two) times daily. 60 tablet 2   clobetasol cream (TEMOVATE) [id number] % Apply 1 Application topically 2 (two) times daily. 30 g 0    diclofenac Sodium (VOLTAREN) 1 % GEL Apply 2 grams topically 4 (four) times daily. 100 g 1   labetalol (NORMODYNE) 100 MG tablet Take 1 tablet (100 mg total) by mouth 2 (two) times daily. 60 tablet 4   meloxicam (MOBIC) 7.5 MG tablet Take 1 tablet (7.5 mg total) by mouth 2 (two) times daily as needed for pain. 30 tablet 2   sertraline (ZOLOFT) 25 MG tablet Take 1 tablet (25 mg total) by mouth daily. 30 tablet 5   spironolactone (ALDACTONE) 25 MG tablet Take 0.5 tablets (12.5 mg total) by mouth daily. 30 tablet 6   tazarotene (TAZORAC) 0.05 % gel Apply topically at bedtime. 30 g 0   traMADol (ULTRAM) 50 MG tablet Take 1 tablet (50 mg total) by mouth every 12 (twelve) hours as needed. 30 tablet 0   triamcinolone cream (KENALOG) 0.1 % Apply 1 application topically 2 (two) times daily. 30 g 2   No current facility-administered medications for this visit.    PHYSICAL EXAMINATION:  ECOG PERFORMANCE STATUS: 1 - Symptomatic but completely ambulatory   Vitals:   07/28/22 1430  BP: (!) 156/98  Pulse: 66  Resp: 17  Temp: 98.1 F (36.7 C)  SpO2: 96%    Filed Weights   07/28/22 1430  Weight: 227 lb 1 oz (103 kg)     Physical Exam Vitals and nursing note reviewed.  Constitutional:      General: She is not in acute distress.    Appearance: Normal appearance. She is obese. She is not ill-appearing, toxic-appearing or diaphoretic.     Comments: Here alone  HENT:     Head: Normocephalic and atraumatic.     Right Ear: External ear normal.     Left Ear: External ear normal.     Nose: Nose normal. No congestion or rhinorrhea.  Eyes:     General: No scleral icterus.    Extraocular Movements: Extraocular movements intact.     Conjunctiva/sclera: Conjunctivae normal.     Pupils: Pupils are equal, round, and reactive to light.  Cardiovascular:     Rate and Rhythm: Normal rate and regular rhythm.     Heart sounds: Normal heart sounds. No murmur heard.    No friction rub. No  gallop.   Pulmonary:     Effort: Pulmonary effort is normal. No respiratory distress.     Breath sounds: Normal breath sounds. No stridor. No wheezing or rhonchi.  Abdominal:     General: Bowel sounds are normal. There is no distension.     Palpations: Abdomen is soft.     Tenderness: There is no abdominal tenderness. There is no guarding.  Musculoskeletal:        General: No swelling, tenderness or deformity.     Cervical back: Normal range of motion and neck supple. No rigidity or tenderness.  Lymphadenopathy:     Head:     Right side of head: No submental, submandibular, tonsillar, preauricular, posterior auricular or occipital adenopathy.     Left side of head: No submental, submandibular, tonsillar, preauricular, posterior auricular or occipital adenopathy.     Cervical: No cervical adenopathy.     Right cervical: No superficial, deep or posterior cervical adenopathy.    Left cervical: No superficial, deep or posterior cervical adenopathy.     Upper Body:     Right upper body: No supraclavicular, axillary, pectoral or epitrochlear adenopathy.     Left upper body: No supraclavicular, axillary, pectoral or epitrochlear adenopathy.  Skin:    General: Skin is warm.     Coloration: Skin is not jaundiced or pale.     Findings: No bruising or erythema.  Neurological:     General: No focal deficit present.     Mental Status: She is alert and oriented to person, place, and time.     Cranial Nerves: No cranial nerve deficit.     Motor: No weakness.  Psychiatric:        Mood and Affect: Mood normal.        Behavior: Behavior normal.        Thought Content: Thought content normal.        Judgment: Judgment normal.     LABORATORY DATA: I have personally reviewed the data as listed:  Appointment on 07/28/2022  Component Date Value Ref Range Status   WBC Count 07/28/2022 9.6  4.0 - 10.5 K/uL Final   RBC 07/28/2022 5.28 (H)  3.87 - 5.11 MIL/uL Final   Hemoglobin 07/28/2022 12.7  12.0 - 15.0  g/dL Final   HCT 07/28/2022 41.2  36.0 - 46.0 % Final   MCV 07/28/2022 78.0 (L)  80.0 - 100.0 fL Final   MCH 07/28/2022 24.1 (L)  26.0 - 34.0 pg Final   MCHC 07/28/2022 30.8  30.0 - 36.0 g/dL Final   RDW 07/28/2022 14.6  11.5 - 15.5 % Final   Platelet Count 07/28/2022 297  150 - 400 K/uL Final   nRBC 07/28/2022 0.0  0.0 - 0.2 % Final   Neutrophils Relative % 07/28/2022 71  % Final   Neutro Abs 07/28/2022 6.7  1.7 - 7.7 K/uL Final   Lymphocytes Relative 07/28/2022 22  % Final   Lymphs Abs 07/28/2022 2.1  0.7 - 4.0 K/uL Final   Monocytes Relative 07/28/2022 6  % Final   Monocytes Absolute 07/28/2022 0.6  0.1 - 1.0 K/uL Final   Eosinophils Relative 07/28/2022 1  % Final   Eosinophils Absolute 07/28/2022 0.1  0.0 - 0.5 K/uL Final   Basophils Relative 07/28/2022 0  % Final   Basophils Absolute 07/28/2022 0.0  0.0 - 0.1 K/uL Final   Immature Granulocytes 07/28/2022 0  % Final   Abs Immature Granulocytes 07/28/2022 0.03  0.00 - 0.07 K/uL Final  Performed at [facility], [place] [address], [place], [place] [address]  Orders Only on 06/30/2022  Component Date Value Ref Range Status   BCR ABL1 / ABL1 06/30/2022 See Scanned report in [place]   Final   Performed at [facility], [address], [place], [place] [address]  Appointment on 06/30/2022  Component Date Value Ref Range Status   Anti-MPO Antibodies 06/30/2022 <0.2  0.0 - 0.9 units Final   Anti-PR3 Antibodies 06/30/2022 <0.2  0.0 - 0.9 units Final   C-ANCA 06/30/2022 <1:20  Neg:<1:20 titer Final   P-ANCA 06/30/2022 <1:20  Neg:<1:20 titer Final   Comment: (NOTE) The presence of positive fluorescence exhibiting P-ANCA or C-ANCA patterns alone is not specific for the diagnosis of Wegener's Granulomatosis (WG) or microscopic polyangiitis. Decisions about treatment should not be based solely on ANCA IFA results.  The International ANCA Group Consensus recommends follow up testing  of positive sera with both PR-3 and MPO-ANCA enzyme immunoassays. As many as 5% serum samples are positive only by EIA. Ref. AM J Clin Pathol 1999;111:507-513.    Atypical P-ANCA titer 06/30/2022 <1:20  Neg:<1:20 titer Final   Comment: (NOTE) The atypical pANCA pattern has been observed in a significant percentage of patients with ulcerative colitis, primary sclerosing cholangitis and autoimmune hepatitis. Performed At: [facility] [place], [place] [id number] Rush Farmer MD [id number]    Rhuematoid fact SerPl-aCnc 06/30/2022 <10.0  <14.0 IU/mL Final   Comment: (NOTE) Performed At: [facility] [place], [place] [id number] Rush Farmer MD [id number]    Aldosterone 06/30/2022 11.4  0.0 - 30.0 ng/dL Final   Comment: (NOTE) This test was developed and its performance characteristics determined by [facility]. It has not been cleared or approved by the Food and Drug Administration. Performed At: [facility] [place], [place] [id number] Rush Farmer MD [id number]   Appointment on 06/30/2022  Component Date Value Ref Range Status   Ferritin 06/30/2022 85  11 - 307 ng/mL Final   Performed at [facility], [address], [place], [place] [address]   WBC Count 06/30/2022 12.2 (H)  4.0 - 10.5 K/uL Final   RBC 06/30/2022 5.12 (H)  3.87 - 5.11 MIL/uL Final   Hemoglobin 06/30/2022 12.3  12.0 - 15.0 g/dL Final   HCT 06/30/2022 39.5  36.0 - 46.0 % Final   MCV 06/30/2022 77.1 (L)  80.0 - 100.0 fL Final   MCH 06/30/2022 24.0 (L)  26.0 - 34.0 pg Final   MCHC 06/30/2022 31.1  30.0 - 36.0 g/dL Final   RDW 06/30/2022 14.6  11.5 - 15.5 % Final   Platelet Count 06/30/2022 261  150 - 400 K/uL Final   nRBC 06/30/2022 0.0  0.0 - 0.2 % Final   Neutrophils Relative % 06/30/2022 64  % Final   Neutro Abs 06/30/2022 7.8 (H)  1.7 - 7.7 K/uL Final   Lymphocytes Relative 06/30/2022 26  % Final   Lymphs  Abs 06/30/2022 3.2  0.7 - 4.0 K/uL Final   Monocytes Relative 06/30/2022 7  % Final   Monocytes Absolute 06/30/2022 0.9  0.1 - 1.0 K/uL Final   Eosinophils Relative 06/30/2022 2  % Final   Eosinophils Absolute 06/30/2022 0.2  0.0 - 0.5 K/uL Final   Basophils Relative 06/30/2022 1  % Final   Basophils Absolute 06/30/2022 0.1  0.0 - 0.1 K/uL Final   Immature Granulocytes 06/30/2022 0  % Final  Abs Immature Granulocytes 06/30/2022 0.03  0.00 - 0.07 K/uL Final   Performed at [facility], [place] [address] [place], [place] [address]    RADIOGRAPHIC STUDIES: I have personally reviewed the radiological images as listed and agree with the findings in the report  No results found.  ASSESSMENT/PLAN  57 y.o. female is here because of polycythemia and granulocytosis.  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   This variant has not been reported as somatic variant in tumors (cosmetic) it has been observed is an extremely rare population variant and publicly available databases.  Due to the positive functional and clinical evidence it significance is currently unclear.  It exhibits an frequency close to 50% raising possibility of germline origin and has been documented in the Clin VAR germline public database May wish to consider a bone marrow bx if hematologic abnormalities persist if patient diagnosed and/or treated for sleep apnea   Obstructive sleep apnea  July 04 2022:  Sleep study demonstrated severe OSA  July 28 2022:  Referring to sleep center for CPAP fitting   Possible causes of Neutrophilia in  this patient  Spirochaetal Syphilis, Leptospirosis   Rickettsial Rocky [place] spotted fever   Chlamydial psittacosis   Protozoal Pneumocystis carinii infection   Mycotic actinomycosis, coccidioidomycosis  Acute Inflammation not caused by infection  RA, rheumatic fever, vasculitis, myositis, pancreatitis, hypersensitivity reactions.  Endocrine/ Metabolic  Obesity  Myeloproliferative neoplasms     Malignant Diseases:   Carcinoma and solid tumors Lymphoma  Drugs  corticosteroids, Cigarette smoking,   Miscellaneous  paroxysmal tachycardia,    Most likely causes in this patient are obesity, smoking, topical corticosteroids.     HTN:  Intermittently controlled.   June 30 2022:  Patient referred to ED for hypertensive urgency.  Intermittently compliant with medication which increases risk of adverse events such as MI, CVA, kidney disease July 28 2022:  BP still high despite claim that she is taking medications regularly.  May improve with CPAP and smoking cessation  Tobacco use  July 28 2022:  Reports that quit smoking 3 weeks ago    Cancer Staging  No matching staging information was found for the patient.   No problem-specific Assessment & Plan notes found for this encounter.   Orders Placed This Encounter  Procedures   ANCA TITERS    Standing Status:   Future    Number of Occurrences:   1    Standing Expiration Date:   07/29/2023   [place]    Standing Status:   Future    Standing Expiration Date:   07/29/2023   Miscellaneous [facility] test (send-out)   33 minutes was spent in patient care.  This included time spent preparing to see the patient (e.g., review of tests), obtaining and/or reviewing separately obtained history, counseling and educating the patient, ordering tests, or procedures; documenting clinical information in the electronic or other health record, independently interpreting results and communicating results to the patient as well as coordination of  care.       All questions were answered. The patient knows to call the clinic with any problems, questions or concerns.  This note  was electronically signed.    Barbee Cough, MD  07/28/2022 2:51 PM

## 2022-07-31 ENCOUNTER — Other Ambulatory Visit: Payer: Self-pay

## 2022-07-31 DIAGNOSIS — N1832 Chronic kidney disease, stage 3b: Secondary | ICD-10-CM

## 2022-07-31 DIAGNOSIS — D751 Secondary polycythemia: Secondary | ICD-10-CM

## 2022-07-31 DIAGNOSIS — I160 Hypertensive urgency: Secondary | ICD-10-CM

## 2022-07-31 LAB — ANCA TITERS
Atypical P-ANCA titer: <1:20 {titer}
C-ANCA: <1:20 {titer}
P-ANCA: <1:20 {titer}

## 2022-08-01 ENCOUNTER — Other Ambulatory Visit: Payer: Self-pay

## 2022-08-01 ENCOUNTER — Other Ambulatory Visit: Payer: Self-pay | Admitting: Internal Medicine

## 2022-08-01 DIAGNOSIS — F322 Major depressive disorder, single episode, severe without psychotic features: Secondary | ICD-10-CM

## 2022-08-01 LAB — MISC LABCORP TEST (SEND OUT): Labcorp test code: 71506

## 2022-08-02 ENCOUNTER — Other Ambulatory Visit: Payer: Self-pay | Admitting: Internal Medicine

## 2022-08-02 ENCOUNTER — Other Ambulatory Visit: Payer: Self-pay

## 2022-08-02 DIAGNOSIS — I10 Essential (primary) hypertension: Secondary | ICD-10-CM

## 2022-08-02 NOTE — Telephone Encounter (Signed)
Requested medication (s) are due for refill today: yes  Requested medication (s) are on the active medication list: yes  Last refill:  01/25/22 #60 2 RF  Future visit scheduled: yes  Notes to clinic:  has no had refill in 3 months was not sure if ok to do short term refill   Requested Prescriptions  Pending Prescriptions Disp Refills   buPROPion (WELLBUTRIN SR) 150 MG 12 hr tablet 60 tablet 2    Sig: Take 1 tablet (150 mg total) by mouth 2 (two) times daily.     Psychiatry: Antidepressants - bupropion Failed - 08/01/2022  2:27 PM      Failed - Cr in normal range and within 360 days    Creat  Date Value Ref Range Status  08/24/2016 1.16 (H) 0.50 - 1.05 mg/dL Final    Comment:      For patients > or = 58 years of age: The upper reference limit for Creatinine is approximately 13% higher for people identified as African-American.      Creatinine, Ser  Date Value Ref Range Status  05/10/2022 1.67 (H) 0.57 - 1.00 mg/dL Final   Creatinine, Urine  Date Value Ref Range Status  07/24/2016 173 20 - 320 mg/dL Final         Failed - Last BP in normal range    BP Readings from Last 1 Encounters:  07/28/22 (!) 156/98         Passed - AST in normal range and within 360 days    AST  Date Value Ref Range Status  05/01/2022 21 0 - 40 IU/L Final         Passed - ALT in normal range and within 360 days    ALT  Date Value Ref Range Status  05/01/2022 9 0 - 32 IU/L Final         Passed - Completed PHQ-2 or PHQ-9 in the last 360 days      Passed - Valid encounter within last 6 months    Recent Outpatient Visits           3 months ago Essential hypertension   [place], MD   7 months ago Essential hypertension   [place], MD   9 months ago Essential hypertension   [place] Ladell Pier, MD   1 year ago Encounter to  establish care   [place], MD   5 years ago Annual physical exam   [place], Maylon Peppers, FNP       Future Appointments             In 3 weeks Ladell Pier, MD [place]

## 2022-08-03 ENCOUNTER — Other Ambulatory Visit: Payer: Self-pay

## 2022-08-03 ENCOUNTER — Other Ambulatory Visit (HOSPITAL_COMMUNITY): Payer: Self-pay

## 2022-08-04 ENCOUNTER — Other Ambulatory Visit: Payer: Self-pay

## 2022-08-04 MED ORDER — BUPROPION HCL ER (SR) 150 MG PO TB12
150.0000 mg | ORAL_TABLET | Freq: Two times a day (BID) | ORAL | 0 refills | Status: DC
  Filled 2022-08-04: qty 60, 30d supply, fill #0

## 2022-08-18 ENCOUNTER — Encounter (HOSPITAL_COMMUNITY): Payer: Self-pay | Admitting: Emergency Medicine

## 2022-08-18 ENCOUNTER — Ambulatory Visit (HOSPITAL_COMMUNITY)
Admission: EM | Admit: 2022-08-18 | Discharge: 2022-08-18 | Disposition: A | Discharge status: Home / Self Care | Payer: Commercial Managed Care - HMO | Attending: Emergency Medicine | Admitting: Emergency Medicine

## 2022-08-18 DIAGNOSIS — L500 Allergic urticaria: Secondary | ICD-10-CM

## 2022-08-18 MED ORDER — DIPHENHYDRAMINE HCL 25 MG PO CAPS
50.0000 mg | ORAL_CAPSULE | Freq: Once | ORAL | Status: AC
  Administered 2022-08-18: 50 mg via ORAL

## 2022-08-18 MED ORDER — DIPHENHYDRAMINE HCL 25 MG PO TABS: 25.0000 mg | ORAL_TABLET | Freq: Four times a day (QID) | ORAL | 0 refills | Status: DC | PRN

## 2022-08-18 MED ORDER — CETIRIZINE HCL 10 MG PO TABS: 10.0000 mg | ORAL_TABLET | Freq: Every day | ORAL | 0 refills | Status: DC

## 2022-08-18 MED ORDER — DIPHENHYDRAMINE HCL 25 MG PO CAPS
ORAL_CAPSULE | ORAL | Status: AC
  Filled 2022-08-18: qty 2

## 2022-08-18 NOTE — Discharge Instructions (Addendum)
Please take the Benadryl every 6 hours as needed for itching.  This can cause sedation, so take it sparingly.  You can also start an over-the-counter antihistamine like cetirizine or Zyrtec to help with your allergic symptoms.  Please wash all linen and clothing in hot water.  Please take a shower and clean body with gentle non-scented soaps like Dove sensitive skin.   Bathing the dog is a good way to remove some of the fleas, however, it is important that the dog gets treated for fleas to prevent growth and reinfection.  Simply medicines are combined with tick and heartworm prevention.  Please take the dog to the vet to get treatment.  Please seek immediate care if you develop shortness of breath, oral swelling, or worsening of condition.

## 2022-08-18 NOTE — ED Triage Notes (Signed)
Pt repots that 3 days ago broke out in hives and was itching. Reports daughter gave her Benadryl that helped but after shower today started back. Reports noticed dog had flees and started after that

## 2022-08-18 NOTE — ED Provider Notes (Signed)
[place]    CSN: [id number] Arrival date & time: 08/18/22  1714      History   Chief Complaint Chief Complaint  Patient presents with   Urticaria    HPI Janice [name] is a 57 y.o. female.   Reports for the last few days she has been itching, has broken out in hives all over her body.  Her boyfriend's dog who lives in the home does have fleas, her boyfriend did bathe the dog yesterday.  The dog has not received any medication for the fleas.  Her daughter had children's Benadryl, she tried 1 dose of this but did not have much relief.  She denies any changes to soaps or detergents, denies any environmental allergens.     The history is provided by the patient.  Urticaria Pertinent negatives include no chest pain and no shortness of breath.    Past Medical History:  Diagnosis Date   Depression    history of   GERD (gastroesophageal reflux disease)    acid reflux at night and with spicy foods   Hypertension    Morbid obesity with BMI of 40.0-44.9, adult [facility])     Patient Active Problem List   Diagnosis Date Noted   Hypertensive urgency 07/10/2022   Noncompliance w/medication treatment due to intermit use of medication 07/10/2022   Sleep apnea, obstructive 06/01/2022   Polycythemia, secondary 05/26/2022   Leukocytosis 05/26/2022   Stage 3b chronic kidney disease ([place]) 05/03/2022   Primary osteoarthritis of right knee 12/13/2021   Morbid obesity ([place]) 12/13/2021   Migraine with aura and without status migrainosus, not intractable 05/31/2021   Severe major depression without psychotic features ([place]) 05/31/2021   Pap smear abnormality of cervix/human papillomavirus (HPV) positive 11/06/2019   Trichomonas infection 11/06/2019   Prediabetes 04/04/2019   Perimenopausal 03/06/2019   Psoriasis 03/06/2019   Vitamin D deficiency 09/29/2015   CHEST PAIN 10/04/2007   OBESITY, NOS 08/02/2006   TOBACCO DEPENDENCE 08/02/2006   Depressive disorder, not elsewhere  classified 08/02/2006   HYPERTENSION, BENIGN SYSTEMIC 08/02/2006   Esophageal reflux 08/02/2006    Past Surgical History:  Procedure Laterality Date   CESAREAN SECTION     x2    OB History   No obstetric history on file.      Home Medications    Prior to Admission medications   Medication Sig Start Date End Date Taking? Authorizing Provider  cetirizine (ZYRTEC ALLERGY) 10 MG tablet Take 1 tablet (10 mg total) by mouth daily. 08/18/22 09/17/22 Yes Louretta Shorten, [place] N, FNP  diphenhydrAMINE (BENADRYL) 25 MG tablet Take 1 tablet (25 mg total) by mouth every 6 (six) hours as needed. 08/18/22  Yes Louretta Shorten, [place], FNP  amLODipine (NORVASC) 10 MG tablet Take 1 tablet (10 mg total) by mouth once nightly at bedtime. 10/18/21   Ladell Pier, MD  aspirin EC 81 MG tablet Take 81 mg by mouth every 4 (four) hours as needed for moderate pain. Swallow whole.    [provider]  Atogepant (QULIPTA) 60 MG TABS Take 1 tablet (60 mg total) by mouth daily. 07/10/22   Pieter Partridge, DO  buPROPion (WELLBUTRIN SR) 150 MG 12 hr tablet Take 1 tablet (150 mg total) by mouth 2 (two) times daily. 08/04/22   Ladell Pier, MD  labetalol (NORMODYNE) 100 MG tablet Take 1 tablet (100 mg total) by mouth 2 (two) times daily. 05/03/22   Ladell Pier, MD  meloxicam (MOBIC) 7.5 MG tablet Take  1 tablet (7.5 mg total) by mouth 2 (two) times daily as needed for pain. 12/13/21   Leandrew Koyanagi, MD  sertraline (ZOLOFT) 25 MG tablet Take 1 tablet (25 mg total) by mouth daily. 05/01/22   Ladell Pier, MD  spironolactone (ALDACTONE) 25 MG tablet Take 0.5 tablets (12.5 mg total) by mouth daily. 12/26/21   Ladell Pier, MD  propranolol ER (INDERAL LA) 80 MG 24 hr capsule Take 1 capsule (80 mg total) by mouth daily. 01/03/22 05/03/22  Pieter Partridge, DO  valsartan (DIOVAN) 40 MG tablet Take 1 tablet (40 mg total) by mouth daily. 05/01/22 05/03/22  Ladell Pier, MD    Family History Family  History  Problem Relation Age of Onset   Hypertension Mother    Kidney disease Mother    Alcohol abuse Father    Depression Father    Drug abuse Father    Hypertension Father    Hypertension Maternal Grandmother    Stroke Maternal Grandmother    Alzheimer's disease Maternal Grandfather    Hypertension Daughter     Social History Social History   Tobacco Use   Smoking status: Every Day    Packs/day: 0.25    Years: 40.00    Additional pack years: 0.00    Total pack years: 10.00    Types: Cigarettes   Smokeless tobacco: Never   Tobacco comments:    Wants to quit in 2021  Vaping Use   Vaping Use: Never used  Substance Use Topics   Alcohol use: Yes    Comment: occ, 2 drinks   Drug use: No     Allergies   Losartan and Neomycin sulfate   Review of Systems Review of Systems  Constitutional:  Negative for fatigue and fever.  HENT:  Negative for sore throat and trouble swallowing.   Respiratory:  Negative for cough and shortness of breath.   Cardiovascular:  Negative for chest pain.  Skin:  Positive for rash.     Physical Exam Triage Vital Signs ED Triage Vitals [08/18/22 1800]  Enc Vitals Group     BP (!) 145/95     Pulse Rate 84     Resp 16     Temp 97.7 F (36.5 C)     Temp Source Oral     SpO2 96 %     Weight      Height      Head Circumference      Peak Flow      Pain Score      Pain Loc      Pain Edu?      Excl. in [place]?    No data found.  Updated Vital Signs BP (!) 145/95 (BP Location: Right Arm)   Pulse 84   Temp 97.7 F (36.5 C) (Oral)   Resp 16   SpO2 96%   Visual Acuity Right Eye Distance:   Left Eye Distance:   Bilateral Distance:    Right Eye Near:   Left Eye Near:    Bilateral Near:     Physical Exam Vitals and nursing note reviewed.  Constitutional:      Appearance: Normal appearance.  HENT:     Head: Normocephalic and atraumatic.     Nose: Nose normal.     Mouth/Throat:     Mouth: Mucous membranes are moist.   Cardiovascular:     Rate and Rhythm: Normal rate and regular rhythm.  Pulmonary:     Effort: Pulmonary effort  is normal. No respiratory distress.  Musculoskeletal:        General: No swelling. Normal range of motion.  Skin:    General: Skin is warm.     Findings: Rash present. Rash is urticarial.     Comments: Urticarial rash under eyes, on bilateral forearms, breasts and torso.  Neurological:     Mental Status: She is alert.  Psychiatric:        Behavior: Behavior is cooperative.      UC Treatments / Results  Labs (all labs ordered are listed, but only abnormal results are displayed) Labs Reviewed - No data to display  EKG   Radiology No results found.  Procedures Procedures (including critical care time)  Medications Ordered in UC Medications  diphenhydrAMINE (BENADRYL) capsule 50 mg (50 mg Oral Given 08/18/22 1831)    Initial Impression / Assessment and Plan / UC Course  I have reviewed the triage vital signs and the nursing notes.  Pertinent labs & imaging results that were available during my care of the patient were reviewed by me and considered in my medical decision making (see chart for details).  Vitals and triage reviewed, patient is hemodynamically stable.  Suspect urticarial rash due to insect exposure.  Without respiratory involvement, denies oral swelling.  Given 50 mg of Benadryl in clinic, advised to take every 6 hours as needed and started on a daily antihistamine.  Advised to get dog treatment for fleas, wash all bedding and clothing in hot water.  Plan of care, follow-up care and emergency precautions discussed.  Patient verbalized understanding, no questions at this time.    Final Clinical Impressions(s) / UC Diagnoses   Final diagnoses:  Allergic urticaria     Discharge Instructions      Please take the Benadryl every 6 hours as needed for itching.  This can cause sedation, so take it sparingly.  You can also start an over-the-counter  antihistamine like cetirizine or Zyrtec to help with your allergic symptoms.  Please wash all linen and clothing in hot water.  Please take a shower and clean body with gentle non-scented soaps like Dove sensitive skin.   Bathing the dog is a good way to remove some of the fleas, however, it is important that the dog gets treated for fleas to prevent growth and reinfection.  Simply medicines are combined with tick and heartworm prevention.  Please take the dog to the vet to get treatment.  Please seek immediate care if you develop shortness of breath, oral swelling, or worsening of condition.      ED Prescriptions     Medication Sig Dispense Auth. Provider   diphenhydrAMINE (BENADRYL) 25 MG tablet Take 1 tablet (25 mg total) by mouth every 6 (six) hours as needed. 30 tablet Louretta Shorten, [place], [place]   cetirizine (ZYRTEC ALLERGY) 10 MG tablet Take 1 tablet (10 mg total) by mouth daily. 30 tablet Jevan Gaunt, [place], [place]      PDMP not reviewed this encounter.   Jeannia Tatro, [place], [place] 08/18/22 [phone]

## 2022-08-29 ENCOUNTER — Ambulatory Visit: Payer: Commercial Managed Care - HMO | Attending: Internal Medicine | Admitting: Internal Medicine

## 2022-08-29 ENCOUNTER — Other Ambulatory Visit: Payer: Self-pay

## 2022-08-29 ENCOUNTER — Encounter: Payer: Self-pay | Admitting: Internal Medicine

## 2022-08-29 VITALS — BP 135/95 | HR 70 | Temp 98.4°F | Ht 60.0 in | Wt 225.0 lb

## 2022-08-29 DIAGNOSIS — F321 Major depressive disorder, single episode, moderate: Secondary | ICD-10-CM | POA: Diagnosis not present

## 2022-08-29 DIAGNOSIS — N1832 Chronic kidney disease, stage 3b: Secondary | ICD-10-CM

## 2022-08-29 DIAGNOSIS — I10 Essential (primary) hypertension: Secondary | ICD-10-CM | POA: Diagnosis not present

## 2022-08-29 DIAGNOSIS — G4733 Obstructive sleep apnea (adult) (pediatric): Secondary | ICD-10-CM

## 2022-08-29 DIAGNOSIS — Z87891 Personal history of nicotine dependence: Secondary | ICD-10-CM

## 2022-08-29 DIAGNOSIS — D751 Secondary polycythemia: Secondary | ICD-10-CM | POA: Diagnosis not present

## 2022-08-29 DIAGNOSIS — Z2821 Immunization not carried out because of patient refusal: Secondary | ICD-10-CM

## 2022-08-29 MED ORDER — SPIRONOLACTONE 25 MG PO TABS
25.0000 mg | ORAL_TABLET | Freq: Every day | ORAL | 6 refills | Status: DC
  Filled 2022-08-29: qty 30, 30d supply, fill #0
  Filled 2022-11-13: qty 30, 30d supply, fill #1
  Filled 2023-01-04: qty 30, 30d supply, fill #2
  Filled 2023-01-30: qty 30, 30d supply, fill #3
  Filled 2023-03-12: qty 30, 30d supply, fill #4
  Filled 2023-04-03: qty 30, 30d supply, fill #5
  Filled 2023-05-14: qty 30, 30d supply, fill #6

## 2022-08-29 MED ORDER — SERTRALINE HCL 50 MG PO TABS
50.0000 mg | ORAL_TABLET | Freq: Every day | ORAL | 4 refills | Status: DC
  Filled 2022-08-29 – 2022-09-11 (×2): qty 30, 30d supply, fill #0
  Filled 2022-10-16: qty 30, 30d supply, fill #1
  Filled 2022-11-30: qty 30, 30d supply, fill #2

## 2022-08-29 NOTE — Patient Instructions (Addendum)
Please call [facility] to schedule your colonoscopy.   [address], [place] [address] PH# [phone]  We referred you to behavioral health at [facility].  Please call them to get in with a psychiatrist and counselor: [facility] [address]  [place], [place] Phone: [phone] Fax: [phone]  Your blood pressure has improved but it is not at goal.  Increase spironolactone to the full 25 mg tablet once a day.  After being on the increased dose for 1 week, please return to the lab on Tuesday of next week to have kidney function and potassium level rechecked.

## 2022-08-29 NOTE — Progress Notes (Addendum)
Patient ID: Janice [name], female    DOB: 18-Oct-1965  MRN: [id number]  CC: Hypertension (HTN f/u. Pt stated that she needs amlodipine but I still see some refills left. )   Subjective: Janice [name] is a 57 y.o. female who presents for chronic ds management Her concerns today include:  Pt with hx of HTN, tob dep, HL, Vit D def, preDM/morbid obesity, MDD, +HPV on pap, GERD, chronic hypokalemia, psoriasis right leg, OA knee (Dr. Erlinda Hong), migraines (Dr. Tomi Likens)   HM:  needs colon CA screen.  Referred to [facility] on last visit and was called several times but she did not return call.  Declines Tdapt and Shingrix vaccines.  She does not have her medicines with her today and struggles with remembering the names.  HTN:  On Norvasc 10 mg daily, Spironolactone 12.5 mg daily and Labetalol 100 mg BID. Reports compliance and took already for today -Wanting for home BP device to be delivered.  We sent in rxn for that a few wks ago. -Kidney function declining and K+ level was elevated.  I had prescribed Diovan on last visit but was able to cancel the prescription before she filled it.  We did labetalol instead.  Referred to nephrology.  Appt tomorrow.   Migraines:  saw Dr. Tomi Likens.  Rx Qulipta.  Does not work.  Given samples of Uvrebly.  Feels this worked better  Polycythemia/leukocytosis: Referred to hematology.  Saw Dr. Federico Flake.  He referred her for sleep study after she reported loud snoring.  She had the sleep study done 07/04/2022.  This revealed severe obstructive sleep apnea with O2 desaturation.  Sleep specialist recommended titration study.  She was referred for it but not sure if she has an appointment as yet. -Carbon monoxide level was mildly elevated. Patient has stopped smoking 1 month ago.  MDD: working on getting set up with [facility] with the assistance of her Tourist information centre manager.  I submitted the referral on last visit.  Referral was sent to [facility].  She is not sure whether they tried to call her or  not Taking Zoloft 25 mg.  Feels she can use higher dose.  Denies any suicidal ideation  Saw Dr. Tomi Likens for migraines and memory difficulties.  His assessment was that pression/anxiety may be playing a role with memory difficulty.  However he referred her for neuropsychiatric evaluation.  He placed on Qulipta 60 mg daily and was given some samples of Ubrelvy.  She feels the Roselyn Meier works better.   Still having problems RT knee getting up and down steps.  No falls.  Has appt with Dr. Erlinda Hong next wk Patient Active Problem List   Diagnosis Date Noted   Tetanus, diphtheria, and acellular pertussis (Tdap) vaccination declined 08/29/2022   Hypertensive urgency 07/10/2022   Noncompliance w/medication treatment due to intermit use of medication 07/10/2022   Sleep apnea, obstructive 06/01/2022   Polycythemia, secondary 05/26/2022   Leukocytosis 05/26/2022   Stage 3b chronic kidney disease ([place]) 05/03/2022   Primary osteoarthritis of right knee 12/13/2021   Morbid obesity ([place]) 12/13/2021   Migraine with aura and without status migrainosus, not intractable 05/31/2021   Severe major depression without psychotic features ([place]) 05/31/2021   Pap smear abnormality of cervix/human papillomavirus (HPV) positive 11/06/2019   Trichomonas infection 11/06/2019   Prediabetes 04/04/2019   Perimenopausal 03/06/2019   Psoriasis 03/06/2019   Vitamin D deficiency 09/29/2015   CHEST PAIN 10/04/2007   OBESITY, NOS 08/02/2006   TOBACCO DEPENDENCE 08/02/2006   Depressive  disorder, not elsewhere classified 08/02/2006   HYPERTENSION, BENIGN SYSTEMIC 08/02/2006   Esophageal reflux 08/02/2006     Current Outpatient Medications on File Prior to Visit  Medication Sig Dispense Refill   amLODipine (NORVASC) 10 MG tablet Take 1 tablet (10 mg total) by mouth once nightly at bedtime. 30 tablet 4   Atogepant (QULIPTA) 60 MG TABS Take 1 tablet (60 mg total) by mouth daily. 30 tablet 11   diphenhydrAMINE (BENADRYL) 25 MG tablet  Take 1 tablet (25 mg total) by mouth every 6 (six) hours as needed. 30 tablet 0   labetalol (NORMODYNE) 100 MG tablet Take 1 tablet (100 mg total) by mouth 2 (two) times daily. 60 tablet 4   meloxicam (MOBIC) 7.5 MG tablet Take 1 tablet (7.5 mg total) by mouth 2 (two) times daily as needed for pain. 30 tablet 2   sertraline (ZOLOFT) 25 MG tablet Take 1 tablet (25 mg total) by mouth daily. 30 tablet 5   aspirin EC 81 MG tablet Take 81 mg by mouth every 4 (four) hours as needed for moderate pain. Swallow whole. (Patient not taking: Reported on 08/29/2022)     buPROPion (WELLBUTRIN SR) 150 MG 12 hr tablet Take 1 tablet (150 mg total) by mouth 2 (two) times daily. (Patient not taking: Reported on 08/29/2022) 60 tablet 0   cetirizine (ZYRTEC ALLERGY) 10 MG tablet Take 1 tablet (10 mg total) by mouth daily. (Patient not taking: Reported on 08/29/2022) 30 tablet 0   No current facility-administered medications on file prior to visit.    Allergies  Allergen Reactions   Losartan     Chest pain   Neomycin Sulfate Swelling, Rash and Other (See Comments)    Pain in ear, swelling and redness    Social History   Socioeconomic History   Marital status: Single    Spouse name: Not on file   Number of children: 3   Years of education: Not on file   Highest education level: Not on file  Occupational History   Not on file  Tobacco Use   Smoking status: Former   Smokeless tobacco: Never  Vaping Use   Vaping Use: Never used  Substance and Sexual Activity   Alcohol use: Yes    Comment: occ, 2 drinks   Drug use: No   Sexual activity: Yes    Birth control/protection: Condom  Other Topics Concern   Not on file  Social History Narrative   Not on file   Social Determinants of Health   Financial Resource Strain: Not on file  Food Insecurity: Food Insecurity Present (04/04/2022)   Hunger Vital Sign    Worried About Running Out of Food in the Last Year: Sometimes true    Ran Out of Food in the Last  Year: Sometimes true  Transportation Needs: Not on file  Physical Activity: Insufficiently Active (03/06/2019)   Exercise Vital Sign    Days of Exercise per Week: 1 day    Minutes of Exercise per Session: 30 min  Stress: No Stress Concern Present (03/06/2019)   [place]    Feeling of Stress : Only a little  Social Connections: Not on file  Intimate Partner Violence: Unknown (03/06/2019)   Humiliation, Afraid, Rape, and Kick questionnaire    Fear of Current or Ex-Partner: Patient declined    Emotionally Abused: Patient declined    Physically Abused: Patient declined    Sexually Abused: Patient declined    Family  History  Problem Relation Age of Onset   Hypertension Mother    Kidney disease Mother    Alcohol abuse Father    Depression Father    Drug abuse Father    Hypertension Father    Hypertension Maternal Grandmother    Stroke Maternal Grandmother    Alzheimer's disease Maternal Grandfather    Hypertension Daughter     Past Surgical History:  Procedure Laterality Date   CESAREAN SECTION     x2    ROS: Review of Systems Negative except as stated above  PHYSICAL EXAM: BP (!) 135/95   Pulse 70   Temp 98.4 F (36.9 C) (Oral)   Ht 5' (1.524 m)   Wt 225 lb (102.1 kg)   SpO2 97%   BMI 43.94 kg/m   Physical Exam  General appearance - alert, well appearing, and in no distress Mental status -patient answers questions appropriately but has memory difficulties Neck - supple, no significant adenopathy Chest - clear to auscultation, no wheezes, rales or rhonchi, symmetric air entry Heart - normal rate, regular rhythm, normal S1, S2, no murmurs, rubs, clicks or gallops Extremities - peripheral pulses normal, no pedal edema, no clubbing or cyanosis     08/29/2022    2:55 PM 05/01/2022    2:54 PM 04/04/2022    9:33 AM  Depression screen PHQ 2/9  Decreased Interest 2 2 1   Down, Depressed, Hopeless 0 3  1  PHQ - 2 Score 2 5 2   Altered sleeping 3 3   Tired, decreased energy 3 3   Change in appetite 2 3   Feeling bad or failure about yourself  2    Trouble concentrating 2 2   Moving slowly or fidgety/restless 3 2   Suicidal thoughts 0 0   PHQ-9 Score 17 18        Latest Ref Rng & Units 05/10/2022    2:42 PM 05/01/2022    3:59 PM 05/31/2021    3:44 PM  CMP  Glucose 70 - 99 mg/dL 98  93  95   BUN 6 - 24 mg/dL 17  15  18    Creatinine 0.57 - 1.00 mg/dL 1.67  1.46  1.23   Sodium 134 - 144 mmol/L 142  140  143   Potassium 3.5 - 5.2 mmol/L 4.1  5.3  3.6   Chloride 96 - 106 mmol/L 99  98  100   CO2 20 - 29 mmol/L 26  23  31    Calcium 8.7 - 10.2 mg/dL 9.6  9.6  9.1   Total Protein 6.0 - 8.5 g/dL  7.8    Total Bilirubin 0.0 - 1.2 mg/dL  0.7    Alkaline Phos 44 - 121 IU/L  152    AST 0 - 40 IU/L  21    ALT 0 - 32 IU/L  9     Lipid Panel     Component Value Date/Time   CHOL 181 05/01/2022 1559   TRIG 141 05/01/2022 1559   HDL 50 05/01/2022 1559   CHOLHDL 3.6 05/01/2022 1559   CHOLHDL 3.7 08/24/2016 1634   VLDL 22 08/24/2016 1634   LDLCALC 106 (H) 05/01/2022 1559    CBC    Component Value Date/Time   WBC 9.6 07/28/2022 1344   WBC 10.9 (H) 04/14/2021 2235   RBC 5.28 (H) 07/28/2022 1344   HGB 12.7 07/28/2022 1344   HGB 14.9 05/01/2022 1559   HCT 41.2 07/28/2022 1344   HCT 47.3 (H) 05/01/2022  1559   PLT 297 07/28/2022 1344   PLT 299 05/01/2022 1559   MCV 78.0 (L) 07/28/2022 1344   MCV 78 (L) 05/01/2022 1559   MCH 24.1 (L) 07/28/2022 1344   MCHC 30.8 07/28/2022 1344   RDW 14.6 07/28/2022 1344   RDW 13.4 05/01/2022 1559   LYMPHSABS 2.1 07/28/2022 1344   LYMPHSABS 2.2 03/06/2019 1344   MONOABS 0.6 07/28/2022 1344   EOSABS 0.1 07/28/2022 1344   EOSABS 0.2 03/06/2019 1344   BASOSABS 0.0 07/28/2022 1344   BASOSABS 0.1 03/06/2019 1344    ASSESSMENT AND PLAN:  1. Essential hypertension Improved but not at goal.  Continue labetalol 100 mg twice a day, amlodipine 10 mg  daily.  Increase spironolactone to 25 mg daily.  After she has been on the increased dose for 1 week, advised to return to the lab to have chemistry checked. - spironolactone (ALDACTONE) 25 MG tablet; Take 1 tablet (25 mg total) by mouth daily.  Dispense: 30 tablet; Refill: 6  2. Stage 3b chronic kidney disease ([place]) Not on NSAIDs. Discussed the importance of [place] getting the blood pressure under good control. Keep appointment tomorrow with nephrology. - Basic Metabolic Panel; Future  3. Polycythemia Likely due to severe sleep apnea.  I have submitted the order for CPAP titration.  I will also have my CMA call the sleep lab to see whether she was previously scheduled.  4. Moderate major depression ([place]) Patient given the information for [facility] and told to call to schedule appointment to assist with management of her depression.  We will increase the dose of Zoloft to 50 mg daily.  66. Former smoker Commended her on quitting.  Encouraged her to remain tobacco free.  6. Tetanus, diphtheria, and acellular pertussis (Tdap) vaccination declined Recommended.  Patient declined.  She also declined Shingrix vaccine.  7. OSA (obstructive sleep apnea) - Cpap titration; Future  Patient reminded to bring all medicines with her on every visit.  Addendum: Patient given information for [facility] gastroenterology.  Advised to call them to get colonoscopy scheduled as they have been trying to reach her.  She promises to do so.  Patient was given the opportunity to ask questions.  Patient verbalized understanding of the plan and was able to repeat key elements of the plan.   This documentation was completed using Radio producer.  Any transcriptional errors are unintentional.  Orders Placed This Encounter  Procedures   Basic Metabolic Panel   Cpap titration     Requested Prescriptions   Signed Prescriptions Disp Refills   spironolactone (ALDACTONE) 25 MG tablet 30 tablet 6     Sig: Take 1 tablet (25 mg total) by mouth daily.    Return in about 4 months (around 12/29/2022) for Give appt 09/04/2022 for lab.  Give appt with Lurena Joiner 1 month for BP check.  Karle Plumber, MD, FACP

## 2022-08-30 ENCOUNTER — Other Ambulatory Visit: Payer: Self-pay

## 2022-08-31 ENCOUNTER — Telehealth: Payer: Self-pay | Admitting: Internal Medicine

## 2022-08-31 NOTE — Telephone Encounter (Signed)
-----   Message from Milly Jakob, [place] sent at 08/30/2022  5:57 PM EDT ----- It has not been scheduled yet because that order was just sent in yesterday and it takes a week to get that done due to the fact that they have to contact insurance.   ----- Message ----- From: Ladell Pier, MD Sent: 08/30/2022   4:41 PM EDT To: Milly Jakob, CMA  Did she state when the CPAP titration study is scheduled for? ----- Message ----- From: Milly Jakob, CMA Sent: 08/30/2022   4:34 PM EDT To: Ladell Pier, MD  Called & spoke to [facility]. She explained that the Inspire titration study that was ordered in February had not been scheduled because the CPAP titration study has be done before the [facility] one. Once the results for the CPAP titration study have resulted then they can move forward with the Inspire titration study if needed. Otherwise the CPAP titration study may be sufficient.   ----- Message ----- From: Ladell Pier, MD Sent: 08/29/2022   5:56 PM EDT To: Milly Jakob, CMA  Please call [facility] lab and inquire whether she has been scheduled for CPAP titration study as yet.

## 2022-09-04 ENCOUNTER — Other Ambulatory Visit: Payer: Self-pay

## 2022-09-04 ENCOUNTER — Other Ambulatory Visit: Payer: Commercial Managed Care - HMO

## 2022-09-06 ENCOUNTER — Ambulatory Visit: Payer: Commercial Managed Care - HMO | Admitting: Orthopaedic Surgery

## 2022-09-11 ENCOUNTER — Other Ambulatory Visit: Payer: Self-pay

## 2022-09-13 ENCOUNTER — Other Ambulatory Visit: Payer: Self-pay

## 2022-09-13 ENCOUNTER — Ambulatory Visit (INDEPENDENT_AMBULATORY_CARE_PROVIDER_SITE_OTHER): Payer: Commercial Managed Care - HMO | Admitting: Orthopaedic Surgery

## 2022-09-13 ENCOUNTER — Telehealth: Payer: Self-pay

## 2022-09-13 DIAGNOSIS — M1711 Unilateral primary osteoarthritis, right knee: Secondary | ICD-10-CM

## 2022-09-13 MED ORDER — TRAMADOL HCL 50 MG PO TABS
50.0000 mg | ORAL_TABLET | Freq: Two times a day (BID) | ORAL | 2 refills | Status: DC | PRN
  Filled 2022-09-13: qty 30, 15d supply, fill #0

## 2022-09-13 NOTE — Progress Notes (Signed)
Office Visit Note   Patient: Janice [name]           Date of Birth: [birth year]           MRN: [id number] Visit Date: 09/13/2022              Requested by: Marcine Matar, MD [address] [place],  [place] [address] PCP: Marcine Matar, MD   Assessment & Plan: Visit Diagnoses:  1. Primary osteoarthritis of right knee     Plan: Impression is right knee arthritis.  We have discussed obtaining Visco injection approval as she currently has insurance.  She is agreeable to this plan.  She will follow-up with [place] once approved.  I sent in tramadol to take for pain in the meantime.  Follow-Up Instructions: Return for f/u once approved for visco injection.   Orders:  No orders of the defined types were placed in this encounter.  Meds ordered this encounter  Medications   traMADol (ULTRAM) 50 MG tablet    Sig: Take 1 tablet (50 mg total) by mouth every 12 (twelve) hours as needed.    Dispense:  30 tablet    Refill:  2      Procedures: No procedures performed   Clinical Data: No additional findings.   Subjective: Chief Complaint  Patient presents with   Right Knee - Pain    HPI patient is a pleasant 57 year old female who comes in today with chronic right knee pain.  The pain is to the entire knee and is constant.  She has increased symptoms with stair climbing, walking for a long time as well as going from a seated to standing position and when she is sleeping.  She has tried topical Voltaren without significant relief.  She is tried cortisone injection in the past with good relief which only lasted for a few days.  She was seen in our office in the fall where we tried to obtain approval for viscosupplementation injection, but unfortunately her insurance had ended.  She notes that she is currently insured.  Review of Systems as detailed in HPI.  All others reviewed and are negative.   Objective: Vital Signs: There were no vitals taken for this  visit.  Physical Exam well-developed well-nourished female no acute distress.  Alert and oriented x 3.  Ortho Exam right knee exam: Small effusion.  Range of motion 0 to 110 degrees.  Medial and lateral joint line tenderness.  Mild patellofemoral crepitus.  Ligaments are stable.  She is neurovascular intact distally.  Specialty Comments:  No specialty comments available.  Imaging: No new imaging   PMFS History: Patient Active Problem List   Diagnosis Date Noted   Tetanus, diphtheria, and acellular pertussis (Tdap) vaccination declined 08/29/2022   Hypertensive urgency 07/10/2022   Noncompliance w/medication treatment due to intermit use of medication 07/10/2022   Sleep apnea, obstructive 06/01/2022   Polycythemia, secondary 05/26/2022   Leukocytosis 05/26/2022   Stage 3b chronic kidney disease 05/03/2022   Primary osteoarthritis of right knee 12/13/2021   Morbid obesity 12/13/2021   Migraine with aura and without status migrainosus, not intractable 05/31/2021   Severe major depression without psychotic features 05/31/2021   Pap smear abnormality of cervix/human papillomavirus (HPV) positive 11/06/2019   Trichomonas infection 11/06/2019   Prediabetes 04/04/2019   Perimenopausal 03/06/2019   Psoriasis 03/06/2019   Vitamin D deficiency 09/29/2015   CHEST PAIN 10/04/2007   OBESITY, NOS 08/02/2006   TOBACCO DEPENDENCE 08/02/2006  Depressive disorder, not elsewhere classified 08/02/2006   HYPERTENSION, BENIGN SYSTEMIC 08/02/2006   Esophageal reflux 08/02/2006   Past Medical History:  Diagnosis Date   Depression    history of   GERD (gastroesophageal reflux disease)    acid reflux at night and with spicy foods   Hypertension    Morbid obesity with BMI of 40.0-44.9, adult (HCC)     Family History  Problem Relation Age of Onset   Hypertension Mother    Kidney disease Mother    Alcohol abuse Father    Depression Father    Drug abuse Father    Hypertension Father     Hypertension Maternal Grandmother    Stroke Maternal Grandmother    Alzheimer's disease Maternal Grandfather    Hypertension Daughter     Past Surgical History:  Procedure Laterality Date   CESAREAN SECTION     x2   Social History   Occupational History   Not on file  Tobacco Use   Smoking status: Former   Smokeless tobacco: Never  Building services engineer Use: Never used  Substance and Sexual Activity   Alcohol use: Yes    Comment: occ, 2 drinks   Drug use: No   Sexual activity: Yes    Birth control/protection: Condom

## 2022-09-13 NOTE — Telephone Encounter (Signed)
Please precert for right knee gel injection. Dr.Xu's patient. Thanks!  

## 2022-09-14 NOTE — Telephone Encounter (Signed)
VOB submitted for Durolane, right knee.  

## 2022-09-25 ENCOUNTER — Ambulatory Visit (HOSPITAL_BASED_OUTPATIENT_CLINIC_OR_DEPARTMENT_OTHER): Payer: Commercial Managed Care - HMO | Attending: Internal Medicine | Admitting: Internal Medicine

## 2022-09-25 VITALS — Ht 60.0 in | Wt 226.0 lb

## 2022-09-25 DIAGNOSIS — G4733 Obstructive sleep apnea (adult) (pediatric): Secondary | ICD-10-CM | POA: Insufficient documentation

## 2022-09-29 ENCOUNTER — Ambulatory Visit: Payer: Commercial Managed Care - HMO | Attending: Internal Medicine | Admitting: Pharmacist

## 2022-09-29 ENCOUNTER — Other Ambulatory Visit: Payer: Self-pay

## 2022-09-29 ENCOUNTER — Encounter: Payer: Self-pay | Admitting: Pharmacist

## 2022-09-29 ENCOUNTER — Other Ambulatory Visit: Payer: Commercial Managed Care - HMO

## 2022-09-29 VITALS — BP 142/87 | HR 82

## 2022-09-29 DIAGNOSIS — I10 Essential (primary) hypertension: Secondary | ICD-10-CM | POA: Diagnosis not present

## 2022-09-29 DIAGNOSIS — N1832 Chronic kidney disease, stage 3b: Secondary | ICD-10-CM

## 2022-09-29 NOTE — Progress Notes (Signed)
S:     No chief complaint on file.  57 y.o. female who presents for hypertension evaluation, education, and management. PMH is significant for HTN, migraines, CKD (missed her appt with Nephrology last month - appt upcoming June), OSA, psoriasis, obesity, vit D deficiency. Patient was referred and last seen by Primary Care Provider, Dr. Laural Benes, on 08/29/2022. At that visit, BP was 135/95 mmHg.   Today, patient arrives in good spirits and presents without assistance. Denies dizziness, headache, blurred vision, swelling.   Patient reports hypertension is longstanding.   Family/Social history:  Fhx: HTN, CKD, stroke Tobacco: former smoker (last month) Alcohol: occasionally   Medication adherence reported. Patient has taken BP medications today.   Current antihypertensives include: amlodipine 10 mg daily, labetalol 100 mg BID, spironolactone 25 mg daily   Reported home BP readings: none. She brings her cuff in today to be shown how to use it.  Patient reported dietary habits: -Sodium: "cut back" on salt -Caffeine: cut back on soda. Does drink tea occasionally.   Patient-reported exercise habits:  -walk: 2 days of the week, for ~45 minutes   O:  Vitals:   09/29/22 1450  BP: (!) 142/87  Pulse: 82     Last 3 Office BP readings: BP Readings from Last 3 Encounters:  08/29/22 (!) 135/95  08/18/22 (!) 145/95  07/28/22 (!) 156/98    BMET    Component Value Date/Time   NA 142 05/10/2022 1442   K 4.1 05/10/2022 1442   CL 99 05/10/2022 1442   CO2 26 05/10/2022 1442   GLUCOSE 98 05/10/2022 1442   GLUCOSE 100 (H) 04/14/2021 2235   BUN 17 05/10/2022 1442   CREATININE 1.67 (H) 05/10/2022 1442   CREATININE 1.16 (H) 08/24/2016 1634   CALCIUM 9.6 05/10/2022 1442   GFRNONAA 58 (L) 04/14/2021 2235   GFRNONAA 55 (L) 08/24/2016 1634   GFRAA 73 10/30/2019 1708   GFRAA 63 08/24/2016 1634    Renal function: CrCl cannot be calculated (Patient's most recent lab result is older than  the maximum 21 days allowed.).  Clinical ASCVD: No  The 10-year ASCVD risk score (Arnett DK, et al., 2019) is: 6.6%   Values used to calculate the score:     Age: 22 years     Sex: Female     Is Non-Hispanic African American: Yes     Diabetic: No     Tobacco smoker: No     Systolic Blood Pressure: 135 mmHg     Is BP treated: Yes     HDL Cholesterol: 50 mg/dL     Total Cholesterol: 181 mg/dL  Patient is participating in a Managed Medicaid Plan:  no  A/P: Hypertension diagnosed currently above goal on current medications. BP goal < 130/80 mmHg. Medication adherence appears appropriate. She did not come back for labs as instructed after increasing dose of spironolactone last month. We will get labs today. I have also advised that she monitor BP at home and return with readings in 4-6 weeks.  -Continued current regimen for now.  -F/u labs ordered - none -Counseled on lifestyle modifications for blood pressure control including reduced dietary sodium, increased exercise, adequate sleep. -Encouraged patient to check BP at home and bring log of readings to next visit. Counseled on proper use of home BP cuff.   Results reviewed and written information provided.    Written patient instructions provided. Patient verbalized understanding of treatment plan.  Total time in face to face counseling 30 minutes.  Follow-up:  Pharmacist in 1 month.  Butch Penny, PharmD, Patsy [place], CPP Clinical Pharmacist [facility] [phone]

## 2022-09-30 ENCOUNTER — Telehealth: Payer: Self-pay | Admitting: Internal Medicine

## 2022-09-30 DIAGNOSIS — G4733 Obstructive sleep apnea (adult) (pediatric): Secondary | ICD-10-CM

## 2022-09-30 LAB — BASIC METABOLIC PANEL
BUN/Creatinine Ratio: 14 (ref 9–23)
BUN: 18 mg/dL (ref 6–24)
CO2: 23 mmol/L (ref 20–29)
Calcium: 9 mg/dL (ref 8.7–10.2)
Chloride: 101 mmol/L (ref 96–106)
Creatinine, Ser: 1.32 mg/dL — ABNORMAL HIGH (ref 0.57–1.00)
Glucose: 128 mg/dL — ABNORMAL HIGH (ref 70–99)
Potassium: 3.9 mmol/L (ref 3.5–5.2)
Sodium: 139 mmol/L (ref 134–144)
eGFR: 47 mL/min/{1.73_m2} — ABNORMAL LOW (ref >59)

## 2022-09-30 NOTE — Procedures (Signed)
Patient Name: Janice [name], Janice [name] Date: 09/25/2022 Gender: Female D.O.B: [birth year] Age (years): 55 Referring Provider: Leatha Gilding MD Height (inches): 60 Interpreting Physician: Jetty Duhamel MD, ABSM Weight (lbs): 226 RPSGT: Armen Pickup BMI: 44 MRN: [id number] Neck Size: 15.50  CLINICAL INFORMATION The patient is referred for a CPAP titration to treat sleep apnea.  Date of NPSG, Split Night or HST:  NPSG 07/04/22  AHI 63.4/ hr, desatuto 66%, body weight 236 lbs  SLEEP STUDY TECHNIQUE As per the AASM Manual for the Scoring of Sleep and Associated Events v2.3 (April 2016) with a hypopnea requiring 4% desaturations.  The channels recorded and monitored were frontal, central and occipital EEG, electrooculogram (EOG), submentalis EMG (chin), nasal and oral airflow, thoracic and abdominal wall motion, anterior tibialis EMG, snore microphone, electrocardiogram, and pulse oximetry. Continuous positive airway pressure (CPAP) was initiated at the beginning of the study and titrated to treat sleep-disordered breathing.  MEDICATIONS Medications self-administered by patient taken the night of the study : AMLODIPINE, BUPROPION, LABETALOL  TECHNICIAN COMMENTS Comments added by technician: Pt had one restroom visted. Patient had difficulty initiating sleep. Comments added by scorer: N/A  RESPIRATORY PARAMETERS Optimal PAP Pressure (cm): 19 AHI at Optimal Pressure (/hr): 3 Overall Minimal O2 (%): 64.0 Supine % at Optimal Pressure (%): 100 Minimal O2 at Optimal Pressure (%): 91.0   SLEEP ARCHITECTURE The study was initiated at 10:36:41 PM and ended at 4:39:01 AM.  Sleep onset time was 53.3 minutes and the sleep efficiency was 65.3%. The total sleep time was 236.5 minutes.  The patient spent 4.2% of the night in stage N1 sleep, 39.5% in stage N2 sleep, 24.5% in stage N3 and 31.7% in REM.Stage REM latency was 56.0 minutes  Wake after sleep onset was 72.6. Alpha intrusion was  absent. Supine sleep was 19.66%.  CARDIAC DATA The 2 lead EKG demonstrated sinus rhythm. The mean heart rate was 71.3 beats per minute. Other EKG findings include: None.  LEG MOVEMENT DATA The total Periodic Limb Movements of Sleep (PLMS) were 0. The PLMS index was 0.0. A PLMS index of <15 is considered normal in adults.  IMPRESSIONS - The optimal PAP pressure was 19 cm of water. - Central sleep apnea was not noted during this titration (CAI = 0.5/h). - Severe oxygen desaturations were observed during this titration (min O2 = 64.0%). Minimum O2 saturation with CPAP 19 was 91%. - No snoring was audible during this study. - No cardiac abnormalities were observed during this study. - Clinically significant periodic limb movements were not noted during this study. Arousals associated with PLMs were rare.  DIAGNOSIS - Obstructive Sleep Apnea (G47.33)  RECOMMENDATIONS - Trial of CPAP therapy on 19 cm H2O or Autopap 10-20. - Patient wore a Medium size Resmed Full Face AirFit F20 mask and heated humidification. - Be careful with alcohol, sedatives and other CNS depressants that may worsen sleep apnea and disrupt normal sleep architecture. - Sleep hygiene should be reviewed to assess factors that may improve sleep quality. - Weight management and regular exercise should be initiated or continued.  [Electronically signed] 09/30/2022 01:21 PM  Jetty Duhamel MD, ABSM Diplomate, American Board of Sleep Medicine NPI: [id number]                         Jetty Duhamel Diplomate, American Board of Sleep Medicine  ELECTRONICALLY SIGNED ON:  09/30/2022, 1:18 PM  [facility] PH: [phone]   FX: [phone] ACCREDITED  BY THE AMERICAN ACADEMY OF SLEEP MEDICINE

## 2022-10-03 ENCOUNTER — Telehealth: Payer: Self-pay | Admitting: Orthopaedic Surgery

## 2022-10-03 NOTE — Telephone Encounter (Signed)
Pt called about update for approval for right knee gel injection. Please call pt at [phone]

## 2022-10-03 NOTE — Telephone Encounter (Signed)
Talked with patient concerning gel injection.  Advised her that an authorization was required and once approved, I will call her to schedule.  Patient voiced that she understands.

## 2022-10-06 ENCOUNTER — Other Ambulatory Visit: Payer: Self-pay

## 2022-10-06 DIAGNOSIS — M1711 Unilateral primary osteoarthritis, right knee: Secondary | ICD-10-CM

## 2022-10-09 NOTE — Telephone Encounter (Signed)
Signed prescription received. Prescription successfully faxed to [facility] on 10/09/2022.

## 2022-10-10 ENCOUNTER — Ambulatory Visit (INDEPENDENT_AMBULATORY_CARE_PROVIDER_SITE_OTHER): Payer: Commercial Managed Care - HMO | Admitting: Orthopaedic Surgery

## 2022-10-10 DIAGNOSIS — M1711 Unilateral primary osteoarthritis, right knee: Secondary | ICD-10-CM

## 2022-10-10 MED ORDER — BUPIVACAINE HCL 0.5 % IJ SOLN
2.0000 mL | INTRAMUSCULAR | Status: AC | PRN
Start: 2022-10-10 — End: 2022-10-10
  Administered 2022-10-10: 2 mL via INTRA_ARTICULAR

## 2022-10-10 MED ORDER — SODIUM HYALURONATE 60 MG/3ML IX PRSY
60.0000 mg | PREFILLED_SYRINGE | INTRA_ARTICULAR | Status: AC | PRN
Start: 2022-10-10 — End: 2022-10-10
  Administered 2022-10-10: 60 mg via INTRA_ARTICULAR

## 2022-10-10 MED ORDER — LIDOCAINE HCL 1 % IJ SOLN
2.0000 mL | INTRAMUSCULAR | Status: AC | PRN
Start: 2022-10-10 — End: 2022-10-10
  Administered 2022-10-10: 2 mL

## 2022-10-10 NOTE — Progress Notes (Signed)
Office Visit Note   Patient: Janice [name]           Date of Birth: 03-10-1966           MRN: [id number] Visit Date: 10/10/2022              Requested by: Marcine Matar, MD [address] [place],  [place] [address] PCP: Marcine Matar, MD   Assessment & Plan: Visit Diagnoses:  1. Primary osteoarthritis of right knee     Plan: Jannett is here for Durolane injection of the right knee.  She tolerated this well.  Follow-Up Instructions: No follow-ups on file.   Orders:  Orders Placed This Encounter  Procedures   Large Joint Inj: R knee   No orders of the defined types were placed in this encounter.     Procedures: Large Joint Inj: R knee on 10/10/2022 12:05 PM Indications: pain Details: 22 G needle  Arthrogram: No  Medications: 2 mL lidocaine 1 %; 2 mL bupivacaine 0.5 %; 60 mg Sodium Hyaluronate 60 MG/3ML Consent was given by the patient. Patient was prepped and draped in the usual sterile fashion.      Clinical Data: No additional findings.   Subjective: Chief Complaint  Patient presents with   Right Knee - Follow-up    HPI  Review of Systems   Objective: Vital Signs: There were no vitals taken for this visit.  Physical Exam  Ortho Exam  Specialty Comments:  No specialty comments available.  Imaging: No results found.   PMFS History: Patient Active Problem List   Diagnosis Date Noted   Tetanus, diphtheria, and acellular pertussis (Tdap) vaccination declined 08/29/2022   Hypertensive urgency 07/10/2022   Noncompliance w/medication treatment due to intermit use of medication 07/10/2022   Sleep apnea, obstructive 06/01/2022   Polycythemia, secondary 05/26/2022   Leukocytosis 05/26/2022   Stage 3b chronic kidney disease (HCC) 05/03/2022   Primary osteoarthritis of right knee 12/13/2021   Morbid obesity (HCC) 12/13/2021   Migraine with aura and without status migrainosus, not intractable 05/31/2021   Severe major  depression without psychotic features (HCC) 05/31/2021   Pap smear abnormality of cervix/human papillomavirus (HPV) positive 11/06/2019   Trichomonas infection 11/06/2019   Prediabetes 04/04/2019   Perimenopausal 03/06/2019   Psoriasis 03/06/2019   Vitamin D deficiency 09/29/2015   CHEST PAIN 10/04/2007   OBESITY, NOS 08/02/2006   TOBACCO DEPENDENCE 08/02/2006   Depressive disorder, not elsewhere classified 08/02/2006   HYPERTENSION, BENIGN SYSTEMIC 08/02/2006   Esophageal reflux 08/02/2006   Past Medical History:  Diagnosis Date   Depression    history of   GERD (gastroesophageal reflux disease)    acid reflux at night and with spicy foods   Hypertension    Morbid obesity with BMI of 40.0-44.9, adult (HCC)     Family History  Problem Relation Age of Onset   Hypertension Mother    Kidney disease Mother    Alcohol abuse Father    Depression Father    Drug abuse Father    Hypertension Father    Hypertension Maternal Grandmother    Stroke Maternal Grandmother    Alzheimer's disease Maternal Grandfather    Hypertension Daughter     Past Surgical History:  Procedure Laterality Date   CESAREAN SECTION     x2   Social History   Occupational History   Not on file  Tobacco Use   Smoking status: Former   Smokeless tobacco: Never  Advertising account planner  Vaping Use: Never used  Substance and Sexual Activity   Alcohol use: Yes    Comment: occ, 2 drinks   Drug use: No   Sexual activity: Yes    Birth control/protection: Condom

## 2022-10-16 ENCOUNTER — Other Ambulatory Visit: Payer: Self-pay | Admitting: Internal Medicine

## 2022-10-16 ENCOUNTER — Other Ambulatory Visit: Payer: Self-pay

## 2022-10-16 DIAGNOSIS — F322 Major depressive disorder, single episode, severe without psychotic features: Secondary | ICD-10-CM

## 2022-10-16 MED ORDER — BUPROPION HCL ER (SR) 150 MG PO TB12
150.0000 mg | ORAL_TABLET | Freq: Two times a day (BID) | ORAL | 0 refills | Status: DC
Start: 2022-10-16 — End: 2022-12-29
  Filled 2022-10-16: qty 60, 30d supply, fill #0
  Filled 2022-11-30: qty 60, 30d supply, fill #1

## 2022-10-18 ENCOUNTER — Other Ambulatory Visit: Payer: Self-pay

## 2022-10-19 ENCOUNTER — Other Ambulatory Visit: Payer: Self-pay

## 2022-10-26 ENCOUNTER — Other Ambulatory Visit: Payer: Self-pay | Admitting: Oncology

## 2022-10-26 DIAGNOSIS — D751 Secondary polycythemia: Secondary | ICD-10-CM

## 2022-10-26 NOTE — Progress Notes (Signed)
[facility] Cancer Follow up  Visit:  Patient Care Team: Marcine Matar, MD as PCP - General (Internal Medicine) Drema Dallas, DO as Consulting Physician (Neurology) Janalyn Harder, MD (Inactive) as Consulting Physician (Dermatology) Loni Muse, MD as Attending Physician (Hematology)  CHIEF COMPLAINTS/PURPOSE OF CONSULTATION:  HISTORY OF PRESENTING ILLNESS: Janice [name] 57 y.o. female is here because of polycythemia and granulocytosis Medical history notable for GERD, hypertension, morbid obesity, chronic kidney disease   Oct 13, 2021: MRI of brain shows probable mild to moderate chronic microvascular ischemic changes April 14, 2022 WBC 10.9 hemoglobin 14.0 MCV 76 platelet count 335; 69 segs  May 01, 2022 CMP notable for creatinine 1.46 potassium 5.3 alk phos 152  May 22, 2022: Nephrology consult placed  May 26, 2022: [facility] Hematology Consult  Snores loud enough to knock paint off walls  Social:  Single.  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  Tobacco began smoking cigarettes at 16 yoa, smokes 1/3 ppd.  EtOH wine cooler 2 to 3 times a week  [facility] Mother died 74 HIV Father died 38 pancreatic cancer Brother alive 19 well  Sister died 49 MVA  WBC 10.5 hemoglobin 13.2 MCV 77 platelet count 289; 72 segs 20 lymphs 7 monos 1 EO 1 basophil. Reticulocyte count 1.1% Erythropoietin  4.3 Hemoglobin electrophoresis normal.  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  This variant has not been reported as somatic variant in tumors (cosmetic) it has been observed is an extremely rare population variant and publicly available databases.  Due to the positive functional and clinical evidence it significance is currently unclear.  It exhibits an frequency close to 50% raising possibility of germline origin and has been documented in the Clin VAR germline public database  SPEP with IEP  demonstrated polyclonal gammopathy.  Serum free kappa 84.8 lambda 51.9 with a kappa lambda 1.63 IgG 1740 IgG a 442 IgM 61 ANA panel negative CRP undetectable.  Sed rate 25 Cortisol 9.1 testosterone 20 TSH/free T41.610/0.54 Carbon monoxide 5.1% (upper limit normal 3.6%) Cobalt undetectable Ferritin 83 CMP notable for creatinine 1.67  June 30 2022:    Ran out of BP meds a few days ago because she couldn't afford them.  Now having HA's.  Recommended that patient go to ED or urgent care to address.  To do sleep study July 04 2022.  Snores real loud.  Stopped smoking 2 weeks ago.    FISH for bcr-abl negative   July 04 2022:  Sleep study.  Severe obstructive sleep apnea occurred during this study (AHI = 63.4/h).  Severe oxygen desaturation was noted during this study (Min O2 = 66.0%, Mean 89.7%). Time with O2 saturation 88% or less was 62.5 minutes.  July 28 2022:   Quit smoking about 3 weeks ago.  Not vaping.  Taking BP medications regularly.  BP still high.  Notes that she has daytime somnolence.     WBC 9.6 hemoglobin 12.7 MCV 78 platelet count 297; 71 segs 22 lymphs 6 monos 1 EO 1 basophil.  ANCA studies negative Will refer patient back for CPAP fitting  September 25, 2022: CPAP titration  Oct 27 2022:  Scheduled follow up for management of polycythemia and granulocytosis.  Sleeping better with the CPAP; just got it a few days ago.  Not smoking anymore and taking BP meds regularly.  Notes BP better since she is doing those things.  DOE better and  no longer having chest pain.      Review of Systems  Constitutional:  Positive for fatigue. Negative for appetite change, chills, fever and unexpected weight change.  HENT:   Positive for tinnitus. Negative for lump/mass, mouth sores, nosebleeds, sore throat, trouble swallowing and voice change.   Eyes:  Negative for eye problems and icterus.       Vision changes:  None  Respiratory:  Negative for chest tightness, cough, hemoptysis  and wheezing.   Cardiovascular:  Positive for leg swelling. Negative for chest pain and palpitations.  Gastrointestinal:  Positive for constipation and nausea. Negative for abdominal pain, blood in stool, diarrhea and vomiting.  Endocrine: Negative for hot flashes.       Cold intolerance:  none Heat intolerance:  none  Genitourinary:  Negative for bladder incontinence, difficulty urinating, dysuria, frequency, hematuria and nocturia.   Musculoskeletal:  Positive for gait problem. Negative for arthralgias, back pain, myalgias, neck pain and neck stiffness.  Skin:  Negative for itching, rash and wound.  Neurological:  Positive for dizziness and gait problem. Negative for headaches, light-headedness, numbness and speech difficulty.       Has had a few missteps but no falls  Hematological:  Negative for adenopathy. Bruises/bleeds easily.  Psychiatric/Behavioral:  Negative for suicidal ideas. The patient is not nervous/anxious.        Hard to get to sleep at night    MEDICAL HISTORY: Past Medical History:  Diagnosis Date   Depression    history of   GERD (gastroesophageal reflux disease)    acid reflux at night and with spicy foods   Hypertension    Morbid obesity with BMI of 40.0-44.9, adult (HCC)     SURGICAL HISTORY: Past Surgical History:  Procedure Laterality Date   CESAREAN SECTION     x2    SOCIAL HISTORY: Social History   Socioeconomic History   Marital status: Single    Spouse name: Not on file   Number of children: 3   Years of education: Not on file   Highest education level: Not on file  Occupational History   Not on file  Tobacco Use   Smoking status: Former   Smokeless tobacco: Never  Vaping Use   Vaping Use: Never used  Substance and Sexual Activity   Alcohol use: Yes    Comment: occ, 2 drinks   Drug use: No   Sexual activity: Yes    Birth control/protection: Condom  Other Topics Concern   Not on file  Social History Narrative   Not on file    Social Determinants of Health   Financial Resource Strain: Low Risk  (09/29/2022)   Overall Financial Resource Strain (CARDIA)    Difficulty of Paying Living Expenses: Not very hard  Food Insecurity: Food Insecurity Present (04/04/2022)   Hunger Vital Sign    Worried About Running Out of Food in the Last Year: Sometimes true    Ran Out of Food in the Last Year: Sometimes true  Transportation Needs: No Transportation Needs (09/29/2022)   PRAPARE - Administrator, Civil Service (Medical): No    Lack of Transportation (Non-Medical): No  Physical Activity: Insufficiently Active (09/29/2022)   Exercise Vital Sign    Days of Exercise per Week: 2 days    Minutes of Exercise per Session: 40 min  Stress: No Stress Concern Present (09/29/2022)   [employer] of Occupational Health - Occupational Stress Questionnaire    Feeling of Stress : Only a little  Social Connections: Unknown (09/29/2022)   Social Connection and Isolation Panel [NHANES]    Frequency of Communication with Friends and Family: More than three times a week    Frequency of Social Gatherings with Friends and Family: More than three times a week    Attends Religious Services: 1 to 4 times per year    Active Member of [employer] or Organizations: No    Attends Banker Meetings: Never    Marital Status: Patient declined  Catering manager Violence: Not At Risk (09/29/2022)   Humiliation, Afraid, Rape, and Kick questionnaire    Fear of Current or Ex-Partner: No    Emotionally Abused: No    Physically Abused: No    Sexually Abused: No    FAMILY HISTORY Family History  Problem Relation Age of Onset   Hypertension Mother    Kidney disease Mother    Alcohol abuse Father    Depression Father    Drug abuse Father    Hypertension Father    Hypertension Maternal Grandmother    Stroke Maternal Grandmother    Alzheimer's disease Maternal Grandfather    Hypertension Daughter     ALLERGIES:  is allergic  to losartan and neomycin sulfate.  MEDICATIONS:  Current Outpatient Medications  Medication Sig Dispense Refill   amLODipine (NORVASC) 10 MG tablet Take 1 tablet (10 mg total) by mouth once nightly at bedtime. 30 tablet 4   aspirin EC 81 MG tablet Take 81 mg by mouth every 4 (four) hours as needed for moderate pain. Swallow whole. (Patient not taking: Reported on 08/29/2022)     Atogepant (QULIPTA) 60 MG TABS Take 1 tablet (60 mg total) by mouth daily. 30 tablet 11   buPROPion (WELLBUTRIN SR) 150 MG 12 hr tablet Take 1 tablet (150 mg total) by mouth 2 (two) times daily. 180 tablet 0   cetirizine (ZYRTEC ALLERGY) 10 MG tablet Take 1 tablet (10 mg total) by mouth daily. (Patient not taking: Reported on 08/29/2022) 30 tablet 0   diphenhydrAMINE (BENADRYL) 25 MG tablet Take 1 tablet (25 mg total) by mouth every 6 (six) hours as needed. 30 tablet 0   labetalol (NORMODYNE) 100 MG tablet Take 1 tablet (100 mg total) by mouth 2 (two) times daily. 60 tablet 4   meloxicam (MOBIC) 7.5 MG tablet Take 1 tablet (7.5 mg total) by mouth 2 (two) times daily as needed for pain. 30 tablet 2   sertraline (ZOLOFT) 50 MG tablet Take 1 tablet (50 mg total) by mouth daily. 30 tablet 4   spironolactone (ALDACTONE) 25 MG tablet Take 1 tablet (25 mg total) by mouth daily. 30 tablet 6   traMADol (ULTRAM) 50 MG tablet Take 1 tablet (50 mg total) by mouth every 12 (twelve) hours as needed. 30 tablet 2   No current facility-administered medications for this visit.    PHYSICAL EXAMINATION:  ECOG PERFORMANCE STATUS: 1 - Symptomatic but completely ambulatory   Vitals:   10/27/22 1524  BP: (!) 126/90  Pulse: 68  Resp: 18  Temp: 98.1 F (36.7 C)  SpO2: 98%    Filed Weights   10/27/22 1524  Weight: 227 lb 6.4 oz (103.1 kg)     Physical Exam Vitals and nursing note reviewed.  Constitutional:      General: She is not in acute distress.    Appearance: Normal appearance. She is obese. She is not ill-appearing,  toxic-appearing or diaphoretic.     Comments: Here alone  HENT:     Head:  Normocephalic and atraumatic.     Right Ear: External ear normal.     Left Ear: External ear normal.     Nose: Nose normal. No congestion or rhinorrhea.  Eyes:     General: No scleral icterus.    Extraocular Movements: Extraocular movements intact.     Conjunctiva/sclera: Conjunctivae normal.     Pupils: Pupils are equal, round, and reactive to light.  Cardiovascular:     Rate and Rhythm: Normal rate and regular rhythm.     Heart sounds: Normal heart sounds. No murmur heard.    No friction rub. No gallop.  Pulmonary:     Effort: Pulmonary effort is normal. No respiratory distress.     Breath sounds: Normal breath sounds. No stridor. No wheezing or rhonchi.  Abdominal:     General: Bowel sounds are normal. There is no distension.     Palpations: Abdomen is soft.     Tenderness: There is no abdominal tenderness. There is no guarding.  Musculoskeletal:        General: No swelling, tenderness or deformity.     Cervical back: Normal range of motion and neck supple. No rigidity or tenderness.  Lymphadenopathy:     Head:     Right side of head: No submental, submandibular, tonsillar, preauricular, posterior auricular or occipital adenopathy.     Left side of head: No submental, submandibular, tonsillar, preauricular, posterior auricular or occipital adenopathy.     Cervical: No cervical adenopathy.     Right cervical: No superficial, deep or posterior cervical adenopathy.    Left cervical: No superficial, deep or posterior cervical adenopathy.     Upper Body:     Right upper body: No supraclavicular, axillary, pectoral or epitrochlear adenopathy.     Left upper body: No supraclavicular, axillary, pectoral or epitrochlear adenopathy.  Skin:    General: Skin is warm.     Coloration: Skin is not jaundiced or pale.     Findings: No bruising or erythema.  Neurological:     General: No focal deficit present.      Mental Status: She is alert and oriented to person, place, and time.     Cranial Nerves: No cranial nerve deficit.     Motor: No weakness.  Psychiatric:        Mood and Affect: Mood normal.        Behavior: Behavior normal.        Thought Content: Thought content normal.        Judgment: Judgment normal.     LABORATORY DATA: I have personally reviewed the data as listed:  Appointment on 10/27/2022  Component Date Value Ref Range Status   WBC Count 10/27/2022 10.2  4.0 - 10.5 K/uL Final   RBC 10/27/2022 5.06  3.87 - 5.11 MIL/uL Final   Hemoglobin 10/27/2022 12.2  12.0 - 15.0 g/dL Final   HCT 96/09/5407 39.9  36.0 - 46.0 % Final   MCV 10/27/2022 78.9 (L)  80.0 - 100.0 fL Final   MCH 10/27/2022 24.1 (L)  26.0 - 34.0 pg Final   MCHC 10/27/2022 30.6  30.0 - 36.0 g/dL Final   RDW 81/19/1478 15.0  11.5 - 15.5 % Final   Platelet Count 10/27/2022 314  150 - 400 K/uL Final   nRBC 10/27/2022 0.0  0.0 - 0.2 % Final   Neutrophils Relative % 10/27/2022 75  % Final   Neutro Abs 10/27/2022 7.6  1.7 - 7.7 K/uL Final   Lymphocytes Relative 10/27/2022 18  %  Final   Lymphs Abs 10/27/2022 1.8  0.7 - 4.0 K/uL Final   Monocytes Relative 10/27/2022 5  % Final   Monocytes Absolute 10/27/2022 0.6  0.1 - 1.0 K/uL Final   Eosinophils Relative 10/27/2022 1  % Final   Eosinophils Absolute 10/27/2022 0.1  0.0 - 0.5 K/uL Final   Basophils Relative 10/27/2022 1  % Final   Basophils Absolute 10/27/2022 0.1  0.0 - 0.1 K/uL Final   Immature Granulocytes 10/27/2022 0  % Final   Abs Immature Granulocytes 10/27/2022 0.03  0.00 - 0.07 K/uL Final   Performed at [facility], [address] [place], [place] [address]  Orders Only on 09/29/2022  Component Date Value Ref Range Status   Glucose 09/29/2022 128 (H)  70 - 99 mg/dL Final   BUN 42/59/5638 18  6 - 24 mg/dL Final   Creatinine, Ser 09/29/2022 1.32 (H)  0.57 - 1.00 mg/dL Final   eGFR 75/64/3329 47 (L)  >59 mL/min/1.73 Final    BUN/Creatinine Ratio 09/29/2022 14  9 - 23 Final   Sodium 09/29/2022 139  134 - 144 mmol/L Final   Potassium 09/29/2022 3.9  3.5 - 5.2 mmol/L Final   Chloride 09/29/2022 101  96 - 106 mmol/L Final   CO2 09/29/2022 23  20 - 29 mmol/L Final   Calcium 09/29/2022 9.0  8.7 - 10.2 mg/dL Final    RADIOGRAPHIC STUDIES: I have personally reviewed the radiological images as listed and agree with the findings in the report  No results found.  ASSESSMENT/PLAN  57 y.o. female is here because of polycythemia and granulocytosis.  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   This variant has not been reported as somatic variant in tumors (cosmetic) it has been observed is an extremely rare population variant and publicly available databases.  Due to the positive functional and clinical evidence it significance is currently unclear.  It exhibits an frequency close to 50% raising possibility of germline origin and has been documented in the Clin VAR germline public database May wish to consider a bone marrow bx if hematologic abnormalities persist if patient diagnosed and/or treated for sleep apnea   Obstructive sleep apnea  July 04 2022:  Sleep study demonstrated severe OSA  July 28 2022:  Referring to sleep center for CPAP fitting   Possible causes of Neutrophilia in this patient  Spirochaetal Syphilis, Leptospirosis   Rickettsial Rocky [place] spotted fever   Chlamydial psittacosis   Protozoal Pneumocystis carinii infection   Mycotic actinomycosis, coccidioidomycosis  Acute Inflammation not caused by infection  RA, rheumatic  fever, vasculitis, myositis, pancreatitis, hypersensitivity reactions.  Endocrine/ Metabolic  Obesity  Myeloproliferative neoplasms     Malignant Diseases:   Carcinoma and solid tumors Lymphoma  Drugs  corticosteroids, Cigarette smoking,   Miscellaneous  paroxysmal tachycardia,    Most likely causes in this patient are obesity, smoking, topical corticosteroids.     HTN:  Intermittently controlled.   June 30 2022:  Patient referred to ED for hypertensive urgency.  Intermittently compliant with medication which increases risk of adverse events such as MI, CVA, kidney disease July 28 2022:  BP still high  despite claim that she is taking medications regularly.  May improve with CPAP and smoking cessation  Tobacco use  July 28 2022:  Reports that quit smoking 3 weeks ago    Cancer Staging  No matching staging information was found for the patient.   No problem-specific Assessment & Plan notes found for this encounter.   No orders of the defined types were placed in this encounter.  33 minutes was spent in patient care.  This included time spent preparing to see the patient (e.g., review of tests), obtaining and/or reviewing separately obtained history, counseling and educating the patient, ordering tests, or procedures; documenting clinical information in the electronic or other health record, independently interpreting results and communicating results to the patient as well as coordination of care.       All questions were answered. The patient knows to call the clinic with any problems, questions or concerns.  This note was electronically signed.    Loni Muse, MD  10/27/2022 3:37 PM

## 2022-10-27 ENCOUNTER — Inpatient Hospital Stay: Payer: Commercial Managed Care - HMO | Attending: Oncology

## 2022-10-27 ENCOUNTER — Inpatient Hospital Stay (HOSPITAL_BASED_OUTPATIENT_CLINIC_OR_DEPARTMENT_OTHER): Payer: Commercial Managed Care - HMO | Admitting: Oncology

## 2022-10-27 VITALS — BP 126/90 | HR 68 | Temp 98.1°F | Resp 18 | Ht 60.0 in | Wt 227.4 lb

## 2022-10-27 DIAGNOSIS — D751 Secondary polycythemia: Secondary | ICD-10-CM | POA: Diagnosis present

## 2022-10-27 DIAGNOSIS — G4733 Obstructive sleep apnea (adult) (pediatric): Secondary | ICD-10-CM | POA: Diagnosis not present

## 2022-10-27 LAB — CBC WITH DIFFERENTIAL (CANCER CENTER ONLY)
Abs Immature Granulocytes: 0.03 10*3/uL (ref 0.00–0.07)
Basophils Absolute: 0.1 10*3/uL (ref 0.0–0.1)
Basophils Relative: 1 %
Eosinophils Absolute: 0.1 10*3/uL (ref 0.0–0.5)
Eosinophils Relative: 1 %
HCT: 39.9 % (ref 36.0–46.0)
Hemoglobin: 12.2 g/dL (ref 12.0–15.0)
Immature Granulocytes: 0 %
Lymphocytes Relative: 18 %
Lymphs Abs: 1.8 10*3/uL (ref 0.7–4.0)
MCH: 24.1 pg — ABNORMAL LOW (ref 26.0–34.0)
MCHC: 30.6 g/dL (ref 30.0–36.0)
MCV: 78.9 fL — ABNORMAL LOW (ref 80.0–100.0)
Monocytes Absolute: 0.6 10*3/uL (ref 0.1–1.0)
Monocytes Relative: 5 %
Neutro Abs: 7.6 10*3/uL (ref 1.7–7.7)
Neutrophils Relative %: 75 %
Platelet Count: 314 10*3/uL (ref 150–400)
RBC: 5.06 MIL/uL (ref 3.87–5.11)
RDW: 15 % (ref 11.5–15.5)
WBC Count: 10.2 10*3/uL (ref 4.0–10.5)
nRBC: 0 % (ref 0.0–0.2)

## 2022-10-27 LAB — FERRITIN: Ferritin: 94 ng/mL (ref 11–307)

## 2022-10-31 ENCOUNTER — Telehealth: Payer: Self-pay | Admitting: Oncology

## 2022-10-31 ENCOUNTER — Ambulatory Visit: Payer: Commercial Managed Care - HMO | Admitting: Pharmacist

## 2022-10-31 NOTE — Telephone Encounter (Signed)
Spoke with patient confirming upcoming appointment  

## 2022-11-13 ENCOUNTER — Other Ambulatory Visit: Payer: Self-pay | Admitting: Internal Medicine

## 2022-11-13 ENCOUNTER — Other Ambulatory Visit: Payer: Self-pay

## 2022-11-13 DIAGNOSIS — I10 Essential (primary) hypertension: Secondary | ICD-10-CM

## 2022-11-14 ENCOUNTER — Ambulatory Visit: Payer: Commercial Managed Care - HMO | Attending: Family Medicine | Admitting: Pharmacist

## 2022-11-14 ENCOUNTER — Other Ambulatory Visit: Payer: Self-pay

## 2022-11-14 ENCOUNTER — Encounter: Payer: Self-pay | Admitting: Pharmacist

## 2022-11-14 VITALS — BP 135/88 | HR 73

## 2022-11-14 DIAGNOSIS — N1832 Chronic kidney disease, stage 3b: Secondary | ICD-10-CM | POA: Diagnosis not present

## 2022-11-14 DIAGNOSIS — I10 Essential (primary) hypertension: Secondary | ICD-10-CM

## 2022-11-14 DIAGNOSIS — G4733 Obstructive sleep apnea (adult) (pediatric): Secondary | ICD-10-CM

## 2022-11-14 DIAGNOSIS — D72829 Elevated white blood cell count, unspecified: Secondary | ICD-10-CM

## 2022-11-14 DIAGNOSIS — D751 Secondary polycythemia: Secondary | ICD-10-CM | POA: Diagnosis not present

## 2022-11-14 MED ORDER — LABETALOL HCL 100 MG PO TABS: 100.0000 mg | ORAL_TABLET | Freq: Two times a day (BID) | ORAL | 1 refills | Status: DC

## 2022-11-14 NOTE — Progress Notes (Signed)
S:     57 y.o. female who presents for hypertension evaluation, education, and management.   PMH is significant for HTN, migraines, CKD, OSA, psoriasis, obesity, polycythemia, and vit D deficiency. Patient was referred and last seen by Primary Care Provider, Dr. Laural Benes, on 08/29/2022. At that visit, BP was 135/95 mmHg.   At last visit, she had not returned for labs after initiation of spironolactone. She was instructed to get labs at that time.  Of note, she still did not get labs at that time.   Today, patient arrives in good spirits and presents without assistance. Denies dizziness, blurred vision, swelling. Endorses headaches.   Patient reports hypertension is longstanding.   Family/Social history:  Fhx: HTN, CKD, stroke Tobacco: former smoker (quit 08/2022) Alcohol: occasionally   Medication adherence reported. She has been out of all of her BP medications for the past 2 days. Patient has not taken BP medications today.   Current antihypertensives include: amlodipine 10 mg daily, labetalol 100 mg BID, spironolactone 25 mg daily   Reported home BP readings: not checking at home; just got a new cuff  O:  Vitals:   11/14/22 1448 11/14/22 1504  BP: (!) 153/98 135/88  Pulse: 75 73    Last 3 Office BP readings: BP Readings from Last 3 Encounters:  10/27/22 (!) 126/90  09/29/22 (!) 142/87  08/29/22 (!) 135/95    BMET    Component Value Date/Time   NA 139 09/29/2022 1502   K 3.9 09/29/2022 1502   CL 101 09/29/2022 1502   CO2 23 09/29/2022 1502   GLUCOSE 128 (H) 09/29/2022 1502   GLUCOSE 100 (H) 04/14/2021 2235   BUN 18 09/29/2022 1502   CREATININE 1.32 (H) 09/29/2022 1502   CREATININE 1.16 (H) 08/24/2016 1634   CALCIUM 9.0 09/29/2022 1502   GFRNONAA 58 (L) 04/14/2021 2235   GFRNONAA 55 (L) 08/24/2016 1634   GFRAA 73 10/30/2019 1708   GFRAA 63 08/24/2016 1634    Renal function: CrCl cannot be calculated (Patient's most recent lab result is older than the maximum  21 days allowed.).  Clinical ASCVD: No  The 10-year ASCVD risk score (Arnett DK, et al., 2019) is: 5.6%   Values used to calculate the score:     Age: 22 years     Sex: Female     Is Non-Hispanic African American: Yes     Diabetic: No     Tobacco smoker: No     Systolic Blood Pressure: 126 mmHg     Is BP treated: Yes     HDL Cholesterol: 50 mg/dL     Total Cholesterol: 181 mg/dL  A/P: Hypertension diagnosed currently uncontrolled. BP goal < 130/80 mmHg. Medication adherence appears appropriate prior to this past week. Control is suboptimal due to running out of medications 2 days prior.  -Continued amlodipine 10 mg daily - Continued labetalol 100 mg BID - Continued spironolactone 25 mg daily .  -F/u labs ordered - CMP and A1c -Counseled on lifestyle modifications for blood pressure control including reduced dietary sodium, increased exercise, adequate sleep. -Encouraged patient to check BP at home and bring log of readings to next visit. Counseled on proper use of home BP cuff.   Results reviewed and written information provided.    Written patient instructions provided. Patient verbalized understanding of treatment plan.  Total time in face to face counseling 19 minutes.    Follow-up:  Pharmacist in one month. PCP clinic visit on 12/29/22.   Dorraine Ellender E.  Michail Jewels, PharmD PGY-1 [facility] Resident

## 2022-11-15 ENCOUNTER — Other Ambulatory Visit: Payer: Self-pay

## 2022-11-15 LAB — CMP14+EGFR
ALT: 9 IU/L (ref 0–32)
AST: 13 IU/L (ref 0–40)
Albumin/Globulin Ratio: 1.2
Albumin: 4 g/dL (ref 3.8–4.9)
Alkaline Phosphatase: 144 IU/L — ABNORMAL HIGH (ref 44–121)
BUN/Creatinine Ratio: 15 (ref 9–23)
BUN: 22 mg/dL (ref 6–24)
Bilirubin Total: 0.3 mg/dL (ref 0.0–1.2)
CO2: 27 mmol/L (ref 20–29)
Calcium: 9.4 mg/dL (ref 8.7–10.2)
Chloride: 102 mmol/L (ref 96–106)
Creatinine, Ser: 1.46 mg/dL — ABNORMAL HIGH (ref 0.57–1.00)
Globulin, Total: 3.4 g/dL (ref 1.5–4.5)
Glucose: 90 mg/dL (ref 70–99)
Potassium: 4.3 mmol/L (ref 3.5–5.2)
Sodium: 140 mmol/L (ref 134–144)
Total Protein: 7.4 g/dL (ref 6.0–8.5)
eGFR: 41 mL/min/{1.73_m2} — ABNORMAL LOW (ref >59)

## 2022-11-15 LAB — HEMOGLOBIN A1C
Est. average glucose Bld gHb Est-mCnc: 117 mg/dL
Hgb A1c MFr Bld: 5.7 % — ABNORMAL HIGH (ref 4.8–5.6)

## 2022-11-28 ENCOUNTER — Telehealth: Payer: Self-pay | Admitting: Internal Medicine

## 2022-11-28 NOTE — Telephone Encounter (Signed)
Pt is calling in because she has a CPAP machine and the pressure is too high causing pt's head and chest to hurt. [facility] says Dr. Laural Benes is the only one who can change the pressure and they need her to reach out to them so they can get it changed. Please advise.

## 2022-11-29 NOTE — Telephone Encounter (Addendum)
Call placed to [facility] spoke with Vance Gather then transferred to [facility] per PCP '    change the pressure on patient's CPAP machine from AutoPap 10-20 to autopap 10-15. Advised by Tereso Newcomer that this had to be sent over as a written order.  Written order faxed to [facility] . Attention adherence dept.

## 2022-11-30 ENCOUNTER — Other Ambulatory Visit: Payer: Self-pay | Admitting: Internal Medicine

## 2022-11-30 ENCOUNTER — Other Ambulatory Visit: Payer: Self-pay

## 2022-11-30 DIAGNOSIS — I10 Essential (primary) hypertension: Secondary | ICD-10-CM

## 2022-11-30 MED ORDER — AMLODIPINE BESYLATE 10 MG PO TABS
10.0000 mg | ORAL_TABLET | Freq: Every day | ORAL | 1 refills | Status: DC
Start: 2022-11-30 — End: 2022-12-29
  Filled 2022-11-30: qty 30, 30d supply, fill #0

## 2022-12-05 ENCOUNTER — Other Ambulatory Visit: Payer: Self-pay

## 2022-12-12 ENCOUNTER — Ambulatory Visit: Payer: Commercial Managed Care - HMO | Admitting: Pharmacist

## 2022-12-28 ENCOUNTER — Ambulatory Visit: Payer: Commercial Managed Care - HMO | Admitting: Pharmacist

## 2022-12-29 ENCOUNTER — Encounter: Payer: Self-pay | Admitting: Internal Medicine

## 2022-12-29 ENCOUNTER — Other Ambulatory Visit: Payer: Self-pay

## 2022-12-29 ENCOUNTER — Ambulatory Visit: Payer: Commercial Managed Care - HMO | Attending: Internal Medicine | Admitting: Internal Medicine

## 2022-12-29 VITALS — BP 132/84 | HR 83 | Temp 99.2°F | Ht 60.0 in | Wt 225.8 lb

## 2022-12-29 DIAGNOSIS — E739 Lactose intolerance, unspecified: Secondary | ICD-10-CM

## 2022-12-29 DIAGNOSIS — G4733 Obstructive sleep apnea (adult) (pediatric): Secondary | ICD-10-CM

## 2022-12-29 DIAGNOSIS — Z23 Encounter for immunization: Secondary | ICD-10-CM | POA: Diagnosis not present

## 2022-12-29 DIAGNOSIS — I10 Essential (primary) hypertension: Secondary | ICD-10-CM

## 2022-12-29 DIAGNOSIS — F321 Major depressive disorder, single episode, moderate: Secondary | ICD-10-CM | POA: Diagnosis not present

## 2022-12-29 DIAGNOSIS — Z0100 Encounter for examination of eyes and vision without abnormal findings: Secondary | ICD-10-CM

## 2022-12-29 MED ORDER — SERTRALINE HCL 50 MG PO TABS
50.0000 mg | ORAL_TABLET | Freq: Every day | ORAL | 1 refills | Status: DC
  Filled 2022-12-29: qty 90, 90d supply, fill #0
  Filled 2023-03-30: qty 90, 90d supply, fill #1

## 2022-12-29 MED ORDER — BUPROPION HCL ER (SR) 150 MG PO TB12
150.0000 mg | ORAL_TABLET | Freq: Two times a day (BID) | ORAL | 1 refills | Status: DC
Start: 2022-12-29 — End: 2023-06-22
  Filled 2022-12-29: qty 180, 90d supply, fill #0
  Filled 2023-03-30: qty 180, 90d supply, fill #1

## 2022-12-29 MED ORDER — LABETALOL HCL 100 MG PO TABS
100.0000 mg | ORAL_TABLET | Freq: Two times a day (BID) | ORAL | 1 refills | Status: DC
  Filled 2022-12-29: qty 180, 90d supply, fill #0
  Filled 2023-03-30: qty 180, 90d supply, fill #1

## 2022-12-29 MED ORDER — AMLODIPINE BESYLATE 10 MG PO TABS
10.0000 mg | ORAL_TABLET | Freq: Every day | ORAL | 1 refills | Status: DC
Start: 2022-12-29 — End: 2023-06-22
  Filled 2022-12-29: qty 90, 90d supply, fill #0
  Filled 2023-03-30: qty 90, 90d supply, fill #1

## 2022-12-29 NOTE — Progress Notes (Signed)
Patient ID: Janice [name], female    DOB: 1965/10/28  MRN: [id number]  CC: Hypertension (HTN f/u. Nicki Reaper diarrhea, bloated, cramping X2 weeks/Yes to pap for next appt.  Yes to Tdap)   Subjective: [name] is a 57 y.o. female who presents for chronic ds management Her concerns today include:  Pt with hx of HTN,  former tob dep, HL, Vit D def, preDM/morbid obesity, MDD, +HPV on pap, GERD, chronic hypokalemia, psoriasis right leg, OA knee (Dr. Roda Shutters), migraines (Dr. Everlena Cooper), OSA (06/2022)  HTN: seen by clinical pharmacist a few times since last visit Continued amlodipine 10 mg daily - Continued labetalol 100 mg BID - Continued spironolactone 25 mg daily .  -got BP device.  Checks BP at least once a wk.  Gives range 125-130/80-82 Limits salt in foods  CKD 3b:  seen at [facility]  Kidney US ordered.   Most recent GFR 41  OSA:  got CPAP.  Feels like too much pressure.  Was on Autopap 10-20.  Dec to 10-15.  Causes chest to hurt and head congestion.  Trying to find more comfortable mask.  Using the device at least 4 hrs a day.  MDD:  taking Zoloft and Wellbutrin.  Finds them to be helpful.  Still working on trying to find a therapist. PHQ9 score lower today than previous.    Diarrhea and crampy x 2 wks intermittently Worse with cheese and diary products like milk  Request eye appt.  Feels eyes strain. Use to wear glasses in past  HM:  due for Tdapt.  C-scope scheduled for next mth Patient Active Problem List   Diagnosis Date Noted   Tetanus, diphtheria, and acellular pertussis (Tdap) vaccination declined 08/29/2022   Hypertensive urgency 07/10/2022   Noncompliance w/medication treatment due to intermit use of medication 07/10/2022   Sleep apnea, obstructive 06/01/2022   Polycythemia, secondary 05/26/2022   Leukocytosis 05/26/2022   Stage 3b chronic kidney disease (HCC) 05/03/2022   Primary osteoarthritis of right knee 12/13/2021   Morbid obesity (HCC) 12/13/2021    Migraine with aura and without status migrainosus, not intractable 05/31/2021   Severe major depression without psychotic features (HCC) 05/31/2021   Pap smear abnormality of cervix/human papillomavirus (HPV) positive 11/06/2019   Trichomonas infection 11/06/2019   Prediabetes 04/04/2019   Perimenopausal 03/06/2019   Psoriasis 03/06/2019   Vitamin D deficiency 09/29/2015   CHEST PAIN 10/04/2007   OBESITY, NOS 08/02/2006   TOBACCO DEPENDENCE 08/02/2006   Depressive disorder, not elsewhere classified 08/02/2006   HYPERTENSION, BENIGN SYSTEMIC 08/02/2006   Esophageal reflux 08/02/2006     Current Outpatient Medications on File Prior to Visit  Medication Sig Dispense Refill   Atogepant (QULIPTA) 60 MG TABS Take 1 tablet (60 mg total) by mouth daily. 30 tablet 11   spironolactone (ALDACTONE) 25 MG tablet Take 1 tablet (25 mg total) by mouth daily. 30 tablet 6   traMADol (ULTRAM) 50 MG tablet Take 1 tablet (50 mg total) by mouth every 12 (twelve) hours as needed. 30 tablet 2   aspirin EC 81 MG tablet Take 81 mg by mouth every 4 (four) hours as needed for moderate pain. Swallow whole. (Patient not taking: Reported on 08/29/2022)     No current facility-administered medications on file prior to visit.    Allergies  Allergen Reactions   Losartan     Chest pain   Neomycin Sulfate Swelling, Rash and Other (See Comments)    Pain in ear, swelling and redness  Social History   Socioeconomic History   Marital status: Single    Spouse name: Not on file   Number of children: 3   Years of education: Not on file   Highest education level: Not on file  Occupational History   Not on file  Tobacco Use   Smoking status: Former   Smokeless tobacco: Never  Vaping Use   Vaping status: Never Used  Substance and Sexual Activity   Alcohol use: Yes    Comment: occ, 2 drinks   Drug use: No   Sexual activity: Yes    Birth control/protection: Condom  Other Topics Concern   Not on file   Social History Narrative   Not on file   Social Determinants of Health   Financial Resource Strain: Low Risk  (09/29/2022)   Overall Financial Resource Strain (CARDIA)    Difficulty of Paying Living Expenses: Not very hard  Food Insecurity: Food Insecurity Present (04/04/2022)   Hunger Vital Sign    Worried About Running Out of Food in the Last Year: Sometimes true    Ran Out of Food in the Last Year: Sometimes true  Transportation Needs: No Transportation Needs (09/29/2022)   PRAPARE - Administrator, Civil Service (Medical): No    Lack of Transportation (Non-Medical): No  Physical Activity: Insufficiently Active (09/29/2022)   Exercise Vital Sign    Days of Exercise per Week: 2 days    Minutes of Exercise per Session: 40 min  Stress: No Stress Concern Present (09/29/2022)   [employer] of Occupational Health - Occupational Stress Questionnaire    Feeling of Stress : Only a little  Social Connections: Unknown (09/29/2022)   Social Connection and Isolation Panel [NHANES]    Frequency of Communication with Friends and Family: More than three times a week    Frequency of Social Gatherings with Friends and Family: More than three times a week    Attends Religious Services: 1 to 4 times per year    Active Member of [employer] or Organizations: No    Attends Banker Meetings: Never    Marital Status: Patient declined  Catering manager Violence: Not At Risk (09/29/2022)   Humiliation, Afraid, Rape, and Kick questionnaire    Fear of Current or Ex-Partner: No    Emotionally Abused: No    Physically Abused: No    Sexually Abused: No    Family History  Problem Relation Age of Onset   Hypertension Mother    Kidney disease Mother    Alcohol abuse Father    Depression Father    Drug abuse Father    Hypertension Father    Hypertension Maternal Grandmother    Stroke Maternal Grandmother    Alzheimer's disease Maternal Grandfather    Hypertension Daughter      Past Surgical History:  Procedure Laterality Date   CESAREAN SECTION     x2    ROS: Review of Systems Negative except as stated above  PHYSICAL EXAM: BP 132/84   Pulse 83   Temp 99.2 F (37.3 C) (Oral)   Ht 5' (1.524 m)   Wt 225 lb 12.8 oz (102.4 kg)   SpO2 95%   BMI 44.10 kg/m   Physical Exam  General appearance - alert, well appearing, and in no distress Mental status - normal mood, behavior, speech, dress, motor activity, and thought processes Neck - supple, no significant adenopathy Chest - clear to auscultation, no wheezes, rales or rhonchi, symmetric air entry Heart -  normal rate, regular rhythm, normal S1, S2, no murmurs, rubs, clicks or gallops Extremities - peripheral pulses normal, no pedal edema, no clubbing or cyanosis      Latest Ref Rng & Units 11/14/2022    4:44 PM 09/29/2022    3:02 PM 05/10/2022    2:42 PM  CMP  Glucose 70 - 99 mg/dL 90  161  98   BUN 6 - 24 mg/dL 22  18  17    Creatinine 0.57 - 1.00 mg/dL 0.96  0.45  4.09   Sodium 134 - 144 mmol/L 140  139  142   Potassium 3.5 - 5.2 mmol/L 4.3  3.9  4.1   Chloride 96 - 106 mmol/L 102  101  99   CO2 20 - 29 mmol/L 27  23  26    Calcium 8.7 - 10.2 mg/dL 9.4  9.0  9.6   Total Protein 6.0 - 8.5 g/dL 7.4     Total Bilirubin 0.0 - 1.2 mg/dL 0.3     Alkaline Phos 44 - 121 IU/L 144     AST 0 - 40 IU/L 13     ALT 0 - 32 IU/L 9      Lipid Panel     Component Value Date/Time   CHOL 181 05/01/2022 1559   TRIG 141 05/01/2022 1559   HDL 50 05/01/2022 1559   CHOLHDL 3.6 05/01/2022 1559   CHOLHDL 3.7 08/24/2016 1634   VLDL 22 08/24/2016 1634   LDLCALC 106 (H) 05/01/2022 1559    CBC    Component Value Date/Time   WBC 10.2 10/27/2022 1513   WBC 10.9 (H) 04/14/2021 2235   RBC 5.06 10/27/2022 1513   HGB 12.2 10/27/2022 1513   HGB 14.9 05/01/2022 1559   HCT 39.9 10/27/2022 1513   HCT 47.3 (H) 05/01/2022 1559   PLT 314 10/27/2022 1513   PLT 299 05/01/2022 1559   MCV 78.9 (L) 10/27/2022 1513    MCV 78 (L) 05/01/2022 1559   MCH 24.1 (L) 10/27/2022 1513   MCHC 30.6 10/27/2022 1513   RDW 15.0 10/27/2022 1513   RDW 13.4 05/01/2022 1559   LYMPHSABS 1.8 10/27/2022 1513   LYMPHSABS 2.2 03/06/2019 1344   MONOABS 0.6 10/27/2022 1513   EOSABS 0.1 10/27/2022 1513   EOSABS 0.2 03/06/2019 1344   BASOSABS 0.1 10/27/2022 1513   BASOSABS 0.1 03/06/2019 1344    ASSESSMENT AND PLAN:  1. Essential hypertension Much closer to goal. Continue current medications which are amlodipine 10 mg daily - labetalol 100 mg BID - spironolactone 25 mg daily . - amLODipine (NORVASC) 10 MG tablet; Take 1 tablet (10 mg total) by mouth once nightly at bedtime.  Dispense: 90 tablet; Refill: 1  2. Moderate major depression (HCC) Improved on medication.  Continue Zoloft and Wellbutrin.  She still has the information sheet with behavioral health providers in the [place] area.  She will continue seeking a therapist. - sertraline (ZOLOFT) 50 MG tablet; Take 1 tablet (50 mg total) by mouth daily.  Dispense: 90 tablet; Refill: 1 - buPROPion (WELLBUTRIN SR) 150 MG 12 hr tablet; Take 1 tablet (150 mg total) by mouth 2 (two) times daily.  Dispense: 180 tablet; Refill: 1  3. Lactose intolerance Advised to avoid dairy products.  Try Lactaid milk or almond milk instead of whole milk.  5. OSA on CPAP She is having a difficult time adapting with the CPAP device.  She is now using a nasal mask.  We have decreased the AutoPap pressure but  she still feels that the pressure is too much.  Will refer her to a sleep specialist - Ambulatory referral to Pulmonology  6. Need for Tdap vaccination Given today.  7. Routine eye exam - Ambulatory referral to Ophthalmology    Patient was given the opportunity to ask questions.  Patient verbalized understanding of the plan and was able to repeat key elements of the plan.   This documentation was completed using Paediatric nurse.  Any transcriptional errors are  unintentional.  Orders Placed This Encounter  Procedures   Tdap vaccine greater than or equal to 7yo IM   Ambulatory referral to Pulmonology   Ambulatory referral to Ophthalmology     Requested Prescriptions   Signed Prescriptions Disp Refills   sertraline (ZOLOFT) 50 MG tablet 90 tablet 1    Sig: Take 1 tablet (50 mg total) by mouth daily.   buPROPion (WELLBUTRIN SR) 150 MG 12 hr tablet 180 tablet 1    Sig: Take 1 tablet (150 mg total) by mouth 2 (two) times daily.   amLODipine (NORVASC) 10 MG tablet 90 tablet 1    Sig: Take 1 tablet (10 mg total) by mouth once nightly at bedtime.   labetalol (NORMODYNE) 100 MG tablet 180 tablet 1    Sig: Take 1 tablet (100 mg total) by mouth 2 (two) times daily.    Return in about 6 weeks (around 02/09/2023) for PAP.  Jonah Blue, MD, FACP

## 2023-01-01 ENCOUNTER — Other Ambulatory Visit (HOSPITAL_BASED_OUTPATIENT_CLINIC_OR_DEPARTMENT_OTHER): Payer: Self-pay

## 2023-01-01 ENCOUNTER — Other Ambulatory Visit: Payer: Self-pay

## 2023-01-02 ENCOUNTER — Other Ambulatory Visit (HOSPITAL_BASED_OUTPATIENT_CLINIC_OR_DEPARTMENT_OTHER): Payer: Self-pay

## 2023-01-02 ENCOUNTER — Other Ambulatory Visit: Payer: Self-pay

## 2023-01-04 ENCOUNTER — Other Ambulatory Visit: Payer: Self-pay

## 2023-01-14 NOTE — Progress Notes (Unsigned)
NEUROLOGY FOLLOW UP OFFICE NOTE  Janice [name] [id number]  Assessment/Plan:   Migraine without aura, without status migrainosus, not intractable      1  Restart Qulipta 60mg  daily (frequency down from daily to twice a week) 2  Ubrelvy 100mg  for acute treatment.  Given cerebrovascular disease, would avoid triptans 3  Limit use of pain relievers to no more than 2 days out of week to prevent risk of rebound or medication-overuse headache. 4.  Continue CPAP 5  Follow up with me in 6 months.       Subjective:  Janice [name] is a 57 year old female with polycythemia, CKD stage 3b, morbid obesity and HTN who follows up for headache.   UPDATE:  Janice [name] last visit.  Discontinued it 2 weeks ago because it was ineffective.  She also has started using the CPAP and blood pressure is better controlled. Ubrelvy effective. Migraines are 2 days a week.  Lasts less than 1 hour with Ubrelvy.     Current NSAIDS/analgesics:  ASA 81mg  daily, meloxicam, tramadol Current triptans:  none Current ergotamine:  none Current anti-emetic:  none Current muscle relaxants:  none Current Antihypertensive medications:  labetalol, amlodipine, spironolactone Current Antidepressant medications:  sertaline, Wellbutrin Current Anticonvulsant medications:  none Current anti-CGRP:  Qulipta 60mg  daily, Ubrelvy 100mg  (samples) Current Vitamins/Herbal/Supplements:  none Current Antihistamines/Decongestants:  none Other therapy:  none Hormone/birth control:  none   HISTORY:  She began experiencing persistent headache and visual disturbance in October 2022.  She described a 9/10 throbbing frontal/temporal headache with photophobia and some blurred vision, confusion, slurred speech, and off balance.  No nausea, vomiting, double vision, visual obscurations, pulsatile tinnitus, numbness ane weakness.  Would wake her up out of her sleep.  She was seen in the ED on 3 occasions between 04/04/2021 and  04/14/2021.  Systolic blood pressure had been from 159 to 200s.  CT head on 04/04/2021 showed chronic small vessel ischemic changes.  Follow up MRV of head was negative for dural venous thrombosis or stenosis.  They started subsiding in December after she was started on daily topiramate and Fioricet, decreasing to 3-4/10 lasting 15-20 minutes occurring every 3 to 4 days (treats with ASA or ibuprofen daily).  She started taking Fioricet almost daily.  Still reports memory problems - gets lost driving on familiar routes or forgetting to take her medications.  Again, this started in October 2022.  Stopped topiramate which did not improve the memory.  To further assess memory, B12 and TSH were checked in August 2023, which were 632 and 0.96 respectively. Blood pressure has still been uncontrolled.  No prior history of headaches.  Just prior to onset of headaches, the father of her children passed away which caused a great deal of emotional distress.  MRI of brain with and without contrast on 10/13/2021 showed mild-moderate chronic small vessel ischemic changes including gliosis along the splenium of the corpus callosum.       Past NSAIDS/analgesics:  naproxen, Fioricet Past abortive triptans:  none Past abortive ergotamine:  none Past muscle relaxants:  tizanidine Past anti-emetic:  none Past antihypertensive medications:  propranolol, lisinopril, HCTZ, losartan Past antidepressant medications:  none Past anticonvulsant medications:  topiramate (thought it may have contributed to cognitive changes) Past anti-CGRP:  none Past vitamins/Herbal/Supplements:  none Past antihistamines/decongestants:  none Other past therapies:  none  PAST MEDICAL HISTORY: Past Medical History:  Diagnosis Date   Depression    history of   GERD (gastroesophageal reflux  disease)    acid reflux at night and with spicy foods   Hypertension    Morbid obesity with BMI of 40.0-44.9, adult [facility])     MEDICATIONS: Current  Outpatient Medications on File Prior to Visit  Medication Sig Dispense Refill   amLODipine (NORVASC) 10 MG tablet Take 1 tablet (10 mg total) by mouth once nightly at bedtime. 90 tablet 1   aspirin EC 81 MG tablet Take 81 mg by mouth every 4 (four) hours as needed for moderate pain. Swallow whole. (Patient not taking: Reported on 08/29/2022)     Atogepant (QULIPTA) 60 MG TABS Take 1 tablet (60 mg total) by mouth daily. 30 tablet 11   buPROPion (WELLBUTRIN SR) 150 MG 12 hr tablet Take 1 tablet (150 mg total) by mouth 2 (two) times daily. 180 tablet 1   labetalol (NORMODYNE) 100 MG tablet Take 1 tablet (100 mg total) by mouth 2 (two) times daily. 180 tablet 1   sertraline (ZOLOFT) 50 MG tablet Take 1 tablet (50 mg total) by mouth daily. 90 tablet 1   spironolactone (ALDACTONE) 25 MG tablet Take 1 tablet (25 mg total) by mouth daily. 30 tablet 6   traMADol (ULTRAM) 50 MG tablet Take 1 tablet (50 mg total) by mouth every 12 (twelve) hours as needed. 30 tablet 2   No current facility-administered medications on file prior to visit.    ALLERGIES: Allergies  Allergen Reactions   Losartan     Chest pain   Neomycin Sulfate Swelling, Rash and Other (See Comments)    Pain in ear, swelling and redness    FAMILY HISTORY: Family History  Problem Relation Age of Onset   Hypertension Mother    Kidney disease Mother    Alcohol abuse Father    Depression Father    Drug abuse Father    Hypertension Father    Hypertension Maternal Grandmother    Stroke Maternal Grandmother    Alzheimer's disease Maternal Grandfather    Hypertension Daughter       Objective:  Blood pressure (!) 144/94, pulse 77, height 5' (1.524 m), weight 227 lb (103 kg), SpO2 97%. General: No acute distress.  Patient appears well-groomed.      Janice Millet, DO  CC: Janice Blue, MD

## 2023-01-15 ENCOUNTER — Ambulatory Visit (INDEPENDENT_AMBULATORY_CARE_PROVIDER_SITE_OTHER): Payer: Commercial Managed Care - HMO | Admitting: Neurology

## 2023-01-15 ENCOUNTER — Encounter: Payer: Self-pay | Admitting: Neurology

## 2023-01-15 ENCOUNTER — Telehealth: Payer: Self-pay | Admitting: Internal Medicine

## 2023-01-15 ENCOUNTER — Other Ambulatory Visit: Payer: Self-pay

## 2023-01-15 VITALS — BP 144/94 | HR 77 | Ht 60.0 in | Wt 227.0 lb

## 2023-01-15 DIAGNOSIS — Z1211 Encounter for screening for malignant neoplasm of colon: Secondary | ICD-10-CM

## 2023-01-15 DIAGNOSIS — G43009 Migraine without aura, not intractable, without status migrainosus: Secondary | ICD-10-CM | POA: Diagnosis not present

## 2023-01-15 MED ORDER — QULIPTA 60 MG PO TABS
60.0000 mg | ORAL_TABLET | Freq: Every day | ORAL | 11 refills | Status: DC
  Filled 2023-01-15: qty 30, 30d supply, fill #0
  Filled 2023-04-03: qty 30, 30d supply, fill #1
  Filled 2023-06-08: qty 30, 30d supply, fill #2
  Filled 2023-07-18: qty 30, 30d supply, fill #3

## 2023-01-15 MED ORDER — UBRELVY 100 MG PO TABS
1.0000 | ORAL_TABLET | ORAL | 11 refills | Status: DC | PRN
Start: 2023-01-15 — End: 2023-07-18
  Filled 2023-01-15: qty 10, 30d supply, fill #0
  Filled 2023-03-30: qty 10, 30d supply, fill #1

## 2023-01-15 NOTE — Patient Instructions (Signed)
Continue qulipta 60mg  daily Take Ubrelvy as needed.

## 2023-01-15 NOTE — Telephone Encounter (Signed)
Referral Request - Has patient seen PCP for this complaint? yes *If NO, is insurance requiring patient see PCP for this issue before PCP can refer them? Referral for which specialty: colonoscopy Preferred provider/office: ? Reason for referral: routine colonscopy

## 2023-01-16 ENCOUNTER — Other Ambulatory Visit: Payer: Self-pay

## 2023-01-17 ENCOUNTER — Other Ambulatory Visit: Payer: Self-pay

## 2023-01-18 ENCOUNTER — Other Ambulatory Visit: Payer: Self-pay

## 2023-01-19 ENCOUNTER — Other Ambulatory Visit (HOSPITAL_COMMUNITY): Payer: Self-pay

## 2023-01-19 ENCOUNTER — Telehealth: Payer: Self-pay | Admitting: Pharmacy Technician

## 2023-01-19 NOTE — Telephone Encounter (Signed)
Pharmacy Patient Advocate Encounter  Received notification from [employer] that Prior Authorization for UBRELVY 100MG  has been APPROVED from 8.16.24 to 8.16.25. Ran test claim, Copay is $0. This test claim was processed through [facility]- copay amounts may vary at other pharmacies due to pharmacy/plan contracts, or as the patient moves through the different stages of their insurance plan.   PA #/Case ID/Reference #: [id number]

## 2023-01-19 NOTE — Telephone Encounter (Signed)
Pharmacy Patient Advocate Encounter   Received notification from CoverMyMeds that prior authorization for UBRELVY 100MG  is required/requested.   Insurance verification completed.   The patient is insured through [employer] .   Per test claim: PA required; PA submitted to [employer] via CoverMyMeds Key/confirmation #/EOC [facility] Status is pending

## 2023-01-22 ENCOUNTER — Other Ambulatory Visit: Payer: Self-pay

## 2023-01-25 ENCOUNTER — Other Ambulatory Visit: Payer: Self-pay | Admitting: Oncology

## 2023-01-25 DIAGNOSIS — D751 Secondary polycythemia: Secondary | ICD-10-CM

## 2023-01-25 NOTE — Progress Notes (Signed)
[facility] Cancer Follow up  Visit:  Patient Care Team: Marcine Matar, MD as PCP - General (Internal Medicine) Drema Dallas, DO as Consulting Physician (Neurology) Janalyn Harder, MD (Inactive) as Consulting Physician (Dermatology) Loni Muse, MD as Attending Physician (Hematology)  CHIEF COMPLAINTS/PURPOSE OF CONSULTATION:  HISTORY OF PRESENTING ILLNESS: Janice [name] 57 y.o. female is here because of polycythemia and granulocytosis Medical history notable for GERD, hypertension, morbid obesity, chronic kidney disease   Oct 13, 2021: MRI of brain shows probable mild to moderate chronic microvascular ischemic changes April 14, 2022 WBC 10.9 hemoglobin 14.0 MCV 76 platelet count 335; 69 segs  May 01, 2022 CMP notable for creatinine 1.46 potassium 5.3 alk phos 152  May 22, 2022: Nephrology consult placed  May 26, 2022: [facility] Hematology Consult  Snores loud enough to knock paint off walls  Social:  Single.  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  Tobacco began smoking cigarettes at 16 yoa, smokes 1/3 ppd.  EtOH wine cooler 2 to 3 times a week  [facility] Mother died 74 HIV Father died 58 pancreatic cancer Brother alive 61 well  Sister died 22 MVA  WBC 10.5 hemoglobin 13.2 MCV 77 platelet count 289; 72 segs 20 lymphs 7 monos 1 EO 1 basophil. Reticulocyte count 1.1% Erythropoietin  4.3 Hemoglobin electrophoresis normal.  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  This variant has not been reported as somatic variant in tumors (cosmetic) it has been observed is an extremely rare population variant and publicly available databases.  Due to the positive functional and clinical evidence it significance is currently unclear.  It exhibits an frequency close to 50% raising possibility of germline origin and has been documented in the Clin VAR germline public database  SPEP with IEP  demonstrated polyclonal gammopathy.  Serum free kappa 84.8 lambda 51.9 with a kappa lambda 1.63 IgG 1740 IgG a 442 IgM 61 ANA panel negative CRP undetectable.  Sed rate 25 Cortisol 9.1 testosterone 20 TSH/free T41.610/0.54 Carbon monoxide 5.1% (upper limit normal 3.6%) Cobalt undetectable Ferritin 83 CMP notable for creatinine 1.67  June 30 2022:    Ran out of BP meds a few days ago because she couldn't afford them.  Now having HA's.  Recommended that patient go to ED or urgent care to address.  To do sleep study July 04 2022.  Snores real loud.  Stopped smoking 2 weeks ago.    FISH for bcr-abl negative   July 04 2022:  Sleep study.  Severe obstructive sleep apnea occurred during this study (AHI = 63.4/h).  Severe oxygen desaturation was noted during this study (Min O2 = 66.0%, Mean 89.7%). Time with O2 saturation 88% or less was 62.5 minutes.  July 28 2022:   Quit smoking about 3 weeks ago.  Not vaping.  Taking BP medications regularly.  BP still high.  Notes that she has daytime somnolence.     WBC 9.6 hemoglobin 12.7 MCV 78 platelet count 297; 71 segs 22 lymphs 6 monos 1 EO 1 basophil.  ANCA studies negative Will refer patient back for CPAP fitting  September 25, 2022: CPAP titration  Oct 27 2022:   Sleeping better with the CPAP; just got it a few days ago.  Not smoking anymore and taking BP meds regularly.  Notes BP better since she is doing those things.  DOE better and no longer having chest pain.    January 26 2023:  Scheduled follow up for management of polycythemia and granulocytosis.   Using her CPAP machine nightly even though she finds that the nasal pillows to be uncomfortable.  Could not tolerate face mask.  Remains a nonsmoker WBC 11.1 hemoglobin 12.0 MCV 78 platelet count 342; 72 segs 20 lymphs 8 monos 1 EO 1 basophil.  ANC 8.1 CMP notable for creatinine 1.82  Discussed nephrology consult to evaluate steady rise in Cr-- she is already seeing a nephrologist.  PCP  is working with patient regarding CPAP.    Review of Systems  Constitutional:  Positive for fatigue. Negative for appetite change, chills, fever and unexpected weight change.  HENT:   Positive for tinnitus. Negative for lump/mass, mouth sores, nosebleeds, sore throat, trouble swallowing and voice change.   Eyes:  Negative for eye problems and icterus.       Vision changes:  None  Respiratory:  Negative for chest tightness, cough, hemoptysis and wheezing.   Cardiovascular:  Positive for leg swelling. Negative for chest pain and palpitations.  Gastrointestinal:  Positive for constipation and nausea. Negative for abdominal pain, blood in stool, diarrhea and vomiting.  Endocrine: Negative for hot flashes.       Cold intolerance:  none Heat intolerance:  none  Genitourinary:  Negative for bladder incontinence, difficulty urinating, dysuria, frequency, hematuria and nocturia.   Musculoskeletal:  Positive for gait problem. Negative for arthralgias, back pain, myalgias, neck pain and neck stiffness.  Skin:  Negative for itching, rash and wound.  Neurological:  Positive for dizziness and gait problem. Negative for headaches, light-headedness, numbness and speech difficulty.       Has had a few missteps but no falls  Hematological:  Negative for adenopathy. Bruises/bleeds easily.  Psychiatric/Behavioral:  Negative for suicidal ideas. The patient is not nervous/anxious.        Hard to get to sleep at night    MEDICAL HISTORY: Past Medical History:  Diagnosis Date   Depression    history of   GERD (gastroesophageal reflux disease)    acid reflux at night and with spicy foods   Hypertension    Morbid obesity with BMI of 40.0-44.9, adult (HCC)     SURGICAL HISTORY: Past Surgical History:  Procedure Laterality Date   CESAREAN SECTION     x2    SOCIAL HISTORY: Social History   Socioeconomic History   Marital status: Single    Spouse name: Not on file   Number of children: 3   Years of  education: Not on file   Highest education level: Not on file  Occupational History   Not on file  Tobacco Use   Smoking status: Former   Smokeless tobacco: Never  Vaping Use   Vaping status: Never Used  Substance and Sexual Activity   Alcohol use: Yes    Comment: occ, 2 drinks   Drug use: No   Sexual activity: Yes    Birth control/protection: Condom  Other Topics Concern   Not on file  Social History Narrative   Right Handed    Social Determinants of Health   Financial Resource Strain: Low Risk  (09/29/2022)   Overall Financial Resource Strain (CARDIA)    Difficulty of Paying Living Expenses: Not very hard  Food Insecurity: Food Insecurity Present (04/04/2022)   Hunger Vital Sign    Worried About Running Out of Food in the Last Year: Sometimes true    Ran Out of Food in the Last Year: Sometimes true  Transportation Needs:  No Transportation Needs (09/29/2022)   PRAPARE - Administrator, Civil Service (Medical): No    Lack of Transportation (Non-Medical): No  Physical Activity: Insufficiently Active (09/29/2022)   Exercise Vital Sign    Days of Exercise per Week: 2 days    Minutes of Exercise per Session: 40 min  Stress: No Stress Concern Present (09/29/2022)   [employer] of Occupational Health - Occupational Stress Questionnaire    Feeling of Stress : Only a little  Social Connections: Unknown (09/29/2022)   Social Connection and Isolation Panel [NHANES]    Frequency of Communication with Friends and Family: More than three times a week    Frequency of Social Gatherings with Friends and Family: More than three times a week    Attends Religious Services: 1 to 4 times per year    Active Member of [employer] or Organizations: No    Attends Banker Meetings: Never    Marital Status: Patient declined  Catering manager Violence: Not At Risk (09/29/2022)   Humiliation, Afraid, Rape, and Kick questionnaire    Fear of Current or Ex-Partner: No     Emotionally Abused: No    Physically Abused: No    Sexually Abused: No    FAMILY HISTORY Family History  Problem Relation Age of Onset   Hypertension Mother    Kidney disease Mother    Alcohol abuse Father    Depression Father    Drug abuse Father    Hypertension Father    Hypertension Maternal Grandmother    Stroke Maternal Grandmother    Alzheimer's disease Maternal Grandfather    Hypertension Daughter     ALLERGIES:  is allergic to losartan and neomycin sulfate.  MEDICATIONS:  Current Outpatient Medications  Medication Sig Dispense Refill   amLODipine (NORVASC) 10 MG tablet Take 1 tablet (10 mg total) by mouth once nightly at bedtime. 90 tablet 1   aspirin EC 81 MG tablet Take 81 mg by mouth every 4 (four) hours as needed for moderate pain. Swallow whole.     Atogepant (QULIPTA) 60 MG TABS Take 1 tablet (60 mg total) by mouth daily. 30 tablet 11   buPROPion (WELLBUTRIN SR) 150 MG 12 hr tablet Take 1 tablet (150 mg total) by mouth 2 (two) times daily. 180 tablet 1   labetalol (NORMODYNE) 100 MG tablet Take 1 tablet (100 mg total) by mouth 2 (two) times daily. 180 tablet 1   sertraline (ZOLOFT) 50 MG tablet Take 1 tablet (50 mg total) by mouth daily. 90 tablet 1   spironolactone (ALDACTONE) 25 MG tablet Take 1 tablet (25 mg total) by mouth daily. 30 tablet 6   traMADol (ULTRAM) 50 MG tablet Take 1 tablet (50 mg total) by mouth every 12 (twelve) hours as needed. 30 tablet 2   Ubrogepant (UBRELVY) 100 MG TABS Take 1 tablet (100 mg total) by mouth as needed. May repeat after 2 hours.  Maximum 2 tablets in 24 hours. 10 tablet 11   No current facility-administered medications for this visit.    PHYSICAL EXAMINATION:  ECOG PERFORMANCE STATUS: 1 - Symptomatic but completely ambulatory   There were no vitals filed for this visit.   There were no vitals filed for this visit.    Physical Exam Vitals and nursing note reviewed.  Constitutional:      General: She is not in  acute distress.    Appearance: Normal appearance. She is obese. She is not ill-appearing, toxic-appearing or diaphoretic.  Comments: Here alone  HENT:     Head: Normocephalic and atraumatic.     Right Ear: External ear normal.     Left Ear: External ear normal.     Nose: Nose normal. No congestion or rhinorrhea.  Eyes:     General: No scleral icterus.    Extraocular Movements: Extraocular movements intact.     Conjunctiva/sclera: Conjunctivae normal.     Pupils: Pupils are equal, round, and reactive to light.  Cardiovascular:     Rate and Rhythm: Normal rate and regular rhythm.     Heart sounds: Normal heart sounds. No murmur heard.    No friction rub. No gallop.  Pulmonary:     Effort: Pulmonary effort is normal. No respiratory distress.     Breath sounds: Normal breath sounds. No stridor. No wheezing or rhonchi.  Abdominal:     General: Bowel sounds are normal. There is no distension.     Palpations: Abdomen is soft.     Tenderness: There is no abdominal tenderness. There is no guarding.  Musculoskeletal:        General: No swelling, tenderness or deformity.     Cervical back: Normal range of motion and neck supple. No rigidity or tenderness.  Lymphadenopathy:     Head:     Right side of head: No submental, submandibular, tonsillar, preauricular, posterior auricular or occipital adenopathy.     Left side of head: No submental, submandibular, tonsillar, preauricular, posterior auricular or occipital adenopathy.     Cervical: No cervical adenopathy.     Right cervical: No superficial, deep or posterior cervical adenopathy.    Left cervical: No superficial, deep or posterior cervical adenopathy.     Upper Body:     Right upper body: No supraclavicular, axillary, pectoral or epitrochlear adenopathy.     Left upper body: No supraclavicular, axillary, pectoral or epitrochlear adenopathy.  Skin:    General: Skin is warm.     Coloration: Skin is not jaundiced or pale.      Findings: No bruising or erythema.  Neurological:     General: No focal deficit present.     Mental Status: She is alert and oriented to person, place, and time.     Cranial Nerves: No cranial nerve deficit.     Motor: No weakness.  Psychiatric:        Mood and Affect: Mood normal.        Behavior: Behavior normal.        Thought Content: Thought content normal.        Judgment: Judgment normal.    LABORATORY DATA: I have personally reviewed the data as listed:  No visits with results within 1 Month(s) from this visit.  Latest known visit with results is:  Office Visit on 11/14/2022  Component Date Value Ref Range Status   Glucose 11/14/2022 90  70 - 99 mg/dL Final   BUN 06/07/7251 22  6 - 24 mg/dL Final   Creatinine, Ser 11/14/2022 1.46 (H)  0.57 - 1.00 mg/dL Final   eGFR 66/44/0347 41 (L)  >59 mL/min/1.73 Final   BUN/Creatinine Ratio 11/14/2022 15  9 - 23 Final   Sodium 11/14/2022 140  134 - 144 mmol/L Final   Potassium 11/14/2022 4.3  3.5 - 5.2 mmol/L Final   Chloride 11/14/2022 102  96 - 106 mmol/L Final   CO2 11/14/2022 27  20 - 29 mmol/L Final   Calcium 11/14/2022 9.4  8.7 - 10.2 mg/dL Final   Total Protein  11/14/2022 7.4  6.0 - 8.5 g/dL Final   Albumin 16/03/9603 4.0  3.8 - 4.9 g/dL Final   Globulin, Total 11/14/2022 3.4  1.5 - 4.5 g/dL Final   Albumin/Globulin Ratio 11/14/2022 1.2   Final   Bilirubin Total 11/14/2022 0.3  0.0 - 1.2 mg/dL Final   Alkaline Phosphatase 11/14/2022 144 (H)  44 - 121 IU/L Final   AST 11/14/2022 13  0 - 40 IU/L Final   ALT 11/14/2022 9  0 - 32 IU/L Final   Hgb A1c MFr Bld 11/14/2022 5.7 (H)  4.8 - 5.6 % Final   Comment:          Prediabetes: 5.7 - 6.4          Diabetes: >6.4          Glycemic control for adults with diabetes: <7.0    Est. average glucose Bld gHb Est-m* 11/14/2022 117  mg/dL Final    RADIOGRAPHIC STUDIES: I have personally reviewed the radiological images as listed and agree with the findings in the report  No results  found.  ASSESSMENT/PLAN  57 y.o. female is here because of polycythemia and granulocytosis.  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   This variant has not been reported as somatic variant in tumors (COSMIC) it has been observed is an extremely rare population variant and publicly available databases.  Due to the positive functional and clinical evidence it significance is currently unclear.  It exhibits an frequency close to 50% raising possibility of germline origin and has been documented in the Clin VAR germline public database May wish to consider a bone marrow bx if hematologic abnormalities persist if patient diagnosed and/or treated for sleep apnea   Obstructive sleep apnea  July 04 2022:  Sleep study demonstrated severe OSA  July 28 2022:  Referred to sleep center for CPAP fitting  Oct 27 2022- Sleeping better with CPAP and BP under better control  January 26 2023- Hgb normalized at 12.0 with regular use of CPAP    Possible causes of Neutrophilia in this patient  Spirochaetal Syphilis, Leptospirosis   Rickettsial [place] spotted fever   Chlamydial psittacosis   Protozoal Pneumocystis carinii infection   Mycotic actinomycosis, coccidioidomycosis  Acute Inflammation not caused by infection  RA, rheumatic fever, vasculitis, myositis, pancreatitis, hypersensitivity reactions.  Endocrine/ Metabolic  Obesity  Myeloproliferative neoplasms     Malignant Diseases:   Carcinoma and solid tumors Lymphoma  Drugs  corticosteroids, Cigarette smoking,   Miscellaneous  paroxysmal tachycardia,    Most likely causes in this patient are obesity, smoking,  topical corticosteroids.     HTN:  Intermittently controlled.   June 30 2022:  Patient referred to ED for hypertensive urgency.  Intermittently compliant with medication which increases risk of adverse events such as MI, CVA, kidney disease July 28 2022:  BP still high despite claim that she is taking medications regularly.  May improve with CPAP and smoking cessation January 26 2023- Has improved with regular CPAP use  CKD  January 26 2023- Followed by nephrology  Tobacco use  July 28 2022:  Reports that quit smoking 3 weeks ago    Cancer Staging  No matching  staging information was found for the patient.    No problem-specific Assessment & Plan notes found for this encounter.   No orders of the defined types were placed in this encounter.  33 minutes was spent in patient care.  This included time spent preparing to see the patient (e.g., review of tests), obtaining and/or reviewing separately obtained history, counseling and educating the patient, ordering tests, or procedures; documenting clinical information in the electronic or other health record, independently interpreting results and communicating results to the patient as well as coordination of care.       All questions were answered. The patient knows to call the clinic with any problems, questions or concerns.  This note was electronically signed.    Loni Muse, MD  01/25/2023 4:30 PM

## 2023-01-26 ENCOUNTER — Inpatient Hospital Stay: Payer: Commercial Managed Care - HMO | Attending: Oncology

## 2023-01-26 ENCOUNTER — Other Ambulatory Visit: Payer: Self-pay

## 2023-01-26 ENCOUNTER — Inpatient Hospital Stay (HOSPITAL_BASED_OUTPATIENT_CLINIC_OR_DEPARTMENT_OTHER): Payer: Commercial Managed Care - HMO | Admitting: Oncology

## 2023-01-26 VITALS — BP 133/84 | HR 78 | Temp 98.2°F | Resp 18 | Ht 60.0 in | Wt 219.2 lb

## 2023-01-26 DIAGNOSIS — F17200 Nicotine dependence, unspecified, uncomplicated: Secondary | ICD-10-CM

## 2023-01-26 DIAGNOSIS — I129 Hypertensive chronic kidney disease with stage 1 through stage 4 chronic kidney disease, or unspecified chronic kidney disease: Secondary | ICD-10-CM | POA: Insufficient documentation

## 2023-01-26 DIAGNOSIS — D709 Neutropenia, unspecified: Secondary | ICD-10-CM | POA: Diagnosis not present

## 2023-01-26 DIAGNOSIS — D72829 Elevated white blood cell count, unspecified: Secondary | ICD-10-CM | POA: Diagnosis not present

## 2023-01-26 DIAGNOSIS — Z6841 Body Mass Index (BMI) 40.0 and over, adult: Secondary | ICD-10-CM

## 2023-01-26 DIAGNOSIS — D751 Secondary polycythemia: Secondary | ICD-10-CM | POA: Diagnosis present

## 2023-01-26 DIAGNOSIS — G4733 Obstructive sleep apnea (adult) (pediatric): Secondary | ICD-10-CM

## 2023-01-26 DIAGNOSIS — N189 Chronic kidney disease, unspecified: Secondary | ICD-10-CM | POA: Diagnosis not present

## 2023-01-26 LAB — CMP (CANCER CENTER ONLY)
ALT: 8 U/L (ref 0–44)
AST: 10 U/L — ABNORMAL LOW (ref 15–41)
Albumin: 3.8 g/dL (ref 3.5–5.0)
Alkaline Phosphatase: 113 U/L (ref 38–126)
Anion gap: 7 (ref 5–15)
BUN: 22 mg/dL — ABNORMAL HIGH (ref 6–20)
CO2: 28 mmol/L (ref 22–32)
Calcium: 9.3 mg/dL (ref 8.9–10.3)
Chloride: 102 mmol/L (ref 98–111)
Creatinine: 1.82 mg/dL — ABNORMAL HIGH (ref 0.44–1.00)
GFR, Estimated: 32 mL/min — ABNORMAL LOW (ref >60)
Glucose, Bld: 104 mg/dL — ABNORMAL HIGH (ref 70–99)
Potassium: 4 mmol/L (ref 3.5–5.1)
Sodium: 137 mmol/L (ref 135–145)
Total Bilirubin: 0.6 mg/dL (ref 0.3–1.2)
Total Protein: 8.1 g/dL (ref 6.5–8.1)

## 2023-01-26 LAB — CBC WITH DIFFERENTIAL (CANCER CENTER ONLY)
Abs Immature Granulocytes: 0.02 10*3/uL (ref 0.00–0.07)
Basophils Absolute: 0.1 10*3/uL (ref 0.0–0.1)
Basophils Relative: 1 %
Eosinophils Absolute: 0.1 10*3/uL (ref 0.0–0.5)
Eosinophils Relative: 1 %
HCT: 39.6 % (ref 36.0–46.0)
Hemoglobin: 12 g/dL (ref 12.0–15.0)
Immature Granulocytes: 0 %
Lymphocytes Relative: 20 %
Lymphs Abs: 2.2 10*3/uL (ref 0.7–4.0)
MCH: 23.7 pg — ABNORMAL LOW (ref 26.0–34.0)
MCHC: 30.3 g/dL (ref 30.0–36.0)
MCV: 78.3 fL — ABNORMAL LOW (ref 80.0–100.0)
Monocytes Absolute: 0.6 10*3/uL (ref 0.1–1.0)
Monocytes Relative: 6 %
Neutro Abs: 8.1 10*3/uL — ABNORMAL HIGH (ref 1.7–7.7)
Neutrophils Relative %: 72 %
Platelet Count: 342 10*3/uL (ref 150–400)
RBC: 5.06 MIL/uL (ref 3.87–5.11)
RDW: 14 % (ref 11.5–15.5)
WBC Count: 11.1 10*3/uL — ABNORMAL HIGH (ref 4.0–10.5)
nRBC: 0 % (ref 0.0–0.2)

## 2023-01-26 LAB — FERRITIN: Ferritin: 127 ng/mL (ref 11–307)

## 2023-01-30 ENCOUNTER — Other Ambulatory Visit: Payer: Self-pay

## 2023-02-01 ENCOUNTER — Other Ambulatory Visit: Payer: Self-pay

## 2023-02-16 ENCOUNTER — Other Ambulatory Visit (HOSPITAL_COMMUNITY)
Admission: RE | Admit: 2023-02-16 | Discharge: 2023-02-16 | Disposition: A | Discharge status: Home / Self Care | Payer: Commercial Managed Care - HMO | Source: Ambulatory Visit | Attending: Internal Medicine | Admitting: Internal Medicine

## 2023-02-16 ENCOUNTER — Ambulatory Visit: Payer: Commercial Managed Care - HMO | Attending: Internal Medicine | Admitting: Internal Medicine

## 2023-02-16 ENCOUNTER — Encounter: Payer: Self-pay | Admitting: Internal Medicine

## 2023-02-16 VITALS — BP 127/87 | HR 87 | Ht 60.0 in | Wt 220.2 lb

## 2023-02-16 DIAGNOSIS — R87810 Cervical high risk human papillomavirus (HPV) DNA test positive: Secondary | ICD-10-CM | POA: Insufficient documentation

## 2023-02-16 DIAGNOSIS — Z1151 Encounter for screening for human papillomavirus (HPV): Secondary | ICD-10-CM | POA: Diagnosis not present

## 2023-02-16 DIAGNOSIS — R87610 Atypical squamous cells of undetermined significance on cytologic smear of cervix (ASC-US): Secondary | ICD-10-CM | POA: Diagnosis not present

## 2023-02-16 DIAGNOSIS — Z124 Encounter for screening for malignant neoplasm of cervix: Secondary | ICD-10-CM | POA: Insufficient documentation

## 2023-02-16 DIAGNOSIS — Z113 Encounter for screening for infections with a predominantly sexual mode of transmission: Secondary | ICD-10-CM | POA: Insufficient documentation

## 2023-02-16 DIAGNOSIS — K5903 Drug induced constipation: Secondary | ICD-10-CM

## 2023-02-16 DIAGNOSIS — R300 Dysuria: Secondary | ICD-10-CM | POA: Diagnosis not present

## 2023-02-16 DIAGNOSIS — I10 Essential (primary) hypertension: Secondary | ICD-10-CM

## 2023-02-16 DIAGNOSIS — Z2821 Immunization not carried out because of patient refusal: Secondary | ICD-10-CM

## 2023-02-16 LAB — POCT URINALYSIS DIP (CLINITEK)
Blood, UA: NEGATIVE
Glucose, UA: NEGATIVE mg/dL
Leukocytes, UA: NEGATIVE
Nitrite, UA: NEGATIVE
Spec Grav, UA: >=1.03 — AB (ref 1.010–1.025)
Urobilinogen, UA: 0.2 U/dL
pH, UA: 6 (ref 5.0–8.0)

## 2023-02-16 MED ORDER — POLYETHYLENE GLYCOL 3350 17 GM/SCOOP PO POWD: 17.0000 g | Freq: Every day | ORAL | 1 refills | Status: DC | PRN

## 2023-02-16 NOTE — Progress Notes (Signed)
Patient ID: Janice [name], female    DOB: March 29, 1966  MRN: [id number]  CC: Gynecologic Exam (Pap. /Pt states she's having issues with constipation due to her medicines and would alternative other than laxatives.)   Subjective: Janice [name] is a 57 y.o. female who presents for PAP Her concerns today include:  Pt with hx of HTN,  former tob dep, HL, Vit D def, preDM/morbid obesity, MDD, +HPV on pap, GERD, chronic hypokalemia, psoriasis right leg, OA knee (Dr. Roda Shutters), migraines (Dr. Everlena Cooper), OSA (06/2022)   GYN History:  Pt is G3P3 Any hx of abn paps?: no Menses regular or irregular?: postmenopausal How long does menses last? NA Menstrual flow light or heavy?:  Method of birth control?:  NA Any vaginal dischg at this time?: yes - yellowish discg and some itching at times Dysuria?: yes and urgency; comes and goes Any hx of STI?: yes Sexually active with how many partners: yes, 1 partner Desires STI screen: yes Last MMG: 06/2022 Family hx of uterine, cervical or breast cancer?:  no  C/o constipation which she thinks is due to [place].  Stools are hard; can go 3-4 days before having BM.  Tried OTC stool softener but does not seem to help  HTN:  took morning meds already for today.  Has not checked BP lately. Limits salt  Meds are amlodipine 10 mg daily, labetalol 100 mg BID, spironolactone 25 mg daily  Patient Active Problem List   Diagnosis Date Noted   Tetanus, diphtheria, and acellular pertussis (Tdap) vaccination declined 08/29/2022   Hypertensive urgency 07/10/2022   Noncompliance w/medication treatment due to intermit use of medication 07/10/2022   Sleep apnea, obstructive 06/01/2022   Polycythemia, secondary 05/26/2022   Leukocytosis 05/26/2022   Stage 3b chronic kidney disease (HCC) 05/03/2022   Primary osteoarthritis of right knee 12/13/2021   Morbid obesity (HCC) 12/13/2021   Migraine with aura and without status migrainosus, not intractable 05/31/2021   Severe major  depression without psychotic features (HCC) 05/31/2021   Pap smear abnormality of cervix/human papillomavirus (HPV) positive 11/06/2019   Trichomonas infection 11/06/2019   Prediabetes 04/04/2019   Perimenopausal 03/06/2019   Psoriasis 03/06/2019   Vitamin D deficiency 09/29/2015   CHEST PAIN 10/04/2007   OBESITY, NOS 08/02/2006   TOBACCO DEPENDENCE 08/02/2006   Depressive disorder, not elsewhere classified 08/02/2006   HYPERTENSION, BENIGN SYSTEMIC 08/02/2006   Esophageal reflux 08/02/2006     Current Outpatient Medications on File Prior to Visit  Medication Sig Dispense Refill   amLODipine (NORVASC) 10 MG tablet Take 1 tablet (10 mg total) by mouth once nightly at bedtime. 90 tablet 1   Atogepant (QULIPTA) 60 MG TABS Take 1 tablet (60 mg total) by mouth daily. 30 tablet 11   buPROPion (WELLBUTRIN SR) 150 MG 12 hr tablet Take 1 tablet (150 mg total) by mouth 2 (two) times daily. 180 tablet 1   labetalol (NORMODYNE) 100 MG tablet Take 1 tablet (100 mg total) by mouth 2 (two) times daily. 180 tablet 1   sertraline (ZOLOFT) 50 MG tablet Take 1 tablet (50 mg total) by mouth daily. 90 tablet 1   spironolactone (ALDACTONE) 25 MG tablet Take 1 tablet (25 mg total) by mouth daily. 30 tablet 6   aspirin EC 81 MG tablet Take 81 mg by mouth every 4 (four) hours as needed for moderate pain. Swallow whole. (Patient not taking: Reported on 02/16/2023)     traMADol (ULTRAM) 50 MG tablet Take 1 tablet (50 mg total) by  mouth every 12 (twelve) hours as needed. (Patient not taking: Reported on 02/16/2023) 30 tablet 2   Ubrogepant (UBRELVY) 100 MG TABS Take 1 tablet (100 mg total) by mouth as needed. May repeat after 2 hours.  Maximum 2 tablets in 24 hours. (Patient not taking: Reported on 02/16/2023) 10 tablet 11   No current facility-administered medications on file prior to visit.    Allergies  Allergen Reactions   Losartan     Chest pain   Neomycin Sulfate Swelling, Rash and Other (See Comments)     Pain in ear, swelling and redness    Social History   Socioeconomic History   Marital status: Single    Spouse name: Not on file   Number of children: 3   Years of education: Not on file   Highest education level: Not on file  Occupational History   Not on file  Tobacco Use   Smoking status: Former   Smokeless tobacco: Never  Vaping Use   Vaping status: Never Used  Substance and Sexual Activity   Alcohol use: Yes    Comment: occ, 2 drinks   Drug use: No   Sexual activity: Yes    Birth control/protection: Condom  Other Topics Concern   Not on file  Social History Narrative   Right Handed    Social Determinants of Health   Financial Resource Strain: Low Risk  (09/29/2022)   Overall Financial Resource Strain (CARDIA)    Difficulty of Paying Living Expenses: Not very hard  Food Insecurity: Food Insecurity Present (04/04/2022)   Hunger Vital Sign    Worried About Running Out of Food in the Last Year: Sometimes true    Ran Out of Food in the Last Year: Sometimes true  Transportation Needs: No Transportation Needs (09/29/2022)   PRAPARE - Administrator, Civil Service (Medical): No    Lack of Transportation (Non-Medical): No  Physical Activity: Insufficiently Active (09/29/2022)   Exercise Vital Sign    Days of Exercise per Week: 2 days    Minutes of Exercise per Session: 40 min  Stress: No Stress Concern Present (09/29/2022)   [employer] of Occupational Health - Occupational Stress Questionnaire    Feeling of Stress : Only a little  Social Connections: Unknown (09/29/2022)   Social Connection and Isolation Panel [NHANES]    Frequency of Communication with Friends and Family: More than three times a week    Frequency of Social Gatherings with Friends and Family: More than three times a week    Attends Religious Services: 1 to 4 times per year    Active Member of [employer] or Organizations: No    Attends Banker Meetings: Never    Marital Status:  Patient declined  Catering manager Violence: Not At Risk (09/29/2022)   Humiliation, Afraid, Rape, and Kick questionnaire    Fear of Current or Ex-Partner: No    Emotionally Abused: No    Physically Abused: No    Sexually Abused: No    Family History  Problem Relation Age of Onset   Hypertension Mother    Kidney disease Mother    Alcohol abuse Father    Depression Father    Drug abuse Father    Hypertension Father    Hypertension Maternal Grandmother    Stroke Maternal Grandmother    Alzheimer's disease Maternal Grandfather    Hypertension Daughter     Past Surgical History:  Procedure Laterality Date   CESAREAN SECTION  x2    ROS: Review of Systems Negative except as stated above  PHYSICAL EXAM: BP 129/87 (BP Location: Right Arm, Patient Position: Sitting, Cuff Size: Large)   Pulse 87   Ht 5' (1.524 m)   Wt 220 lb 3.2 oz (99.9 kg)   SpO2 98%   BMI 43.00 kg/m   Physical Exam  General appearance - alert, well appearing, and in no distress Mental status - normal mood, behavior, speech, dress, motor activity, and thought processes Pelvic - CMA Clarissa present: normal external genitalia, vulva, vagina, cervix, uterus and adnexa      Latest Ref Rng & Units 01/26/2023    3:15 PM 11/14/2022    4:44 PM 09/29/2022    3:02 PM  CMP  Glucose 70 - 99 mg/dL 161  90  096   BUN 6 - 20 mg/dL 22  22  18    Creatinine 0.44 - 1.00 mg/dL 0.45  4.09  8.11   Sodium 135 - 145 mmol/L 137  140  139   Potassium 3.5 - 5.1 mmol/L 4.0  4.3  3.9   Chloride 98 - 111 mmol/L 102  102  101   CO2 22 - 32 mmol/L 28  27  23    Calcium 8.9 - 10.3 mg/dL 9.3  9.4  9.0   Total Protein 6.5 - 8.1 g/dL 8.1  7.4    Total Bilirubin 0.3 - 1.2 mg/dL 0.6  0.3    Alkaline Phos 38 - 126 U/L 113  144    AST 15 - 41 U/L 10  13    ALT 0 - 44 U/L 8  9     Lipid Panel     Component Value Date/Time   CHOL 181 05/01/2022 1559   TRIG 141 05/01/2022 1559   HDL 50 05/01/2022 1559   CHOLHDL 3.6 05/01/2022  1559   CHOLHDL 3.7 08/24/2016 1634   VLDL 22 08/24/2016 1634   LDLCALC 106 (H) 05/01/2022 1559    CBC    Component Value Date/Time   WBC 11.1 (H) 01/26/2023 1515   WBC 10.9 (H) 04/14/2021 2235   RBC 5.06 01/26/2023 1515   HGB 12.0 01/26/2023 1515   HGB 14.9 05/01/2022 1559   HCT 39.6 01/26/2023 1515   HCT 47.3 (H) 05/01/2022 1559   PLT 342 01/26/2023 1515   PLT 299 05/01/2022 1559   MCV 78.3 (L) 01/26/2023 1515   MCV 78 (L) 05/01/2022 1559   MCH 23.7 (L) 01/26/2023 1515   MCHC 30.3 01/26/2023 1515   RDW 14.0 01/26/2023 1515   RDW 13.4 05/01/2022 1559   LYMPHSABS 2.2 01/26/2023 1515   LYMPHSABS 2.2 03/06/2019 1344   MONOABS 0.6 01/26/2023 1515   EOSABS 0.1 01/26/2023 1515   EOSABS 0.2 03/06/2019 1344   BASOSABS 0.1 01/26/2023 1515   BASOSABS 0.1 03/06/2019 1344   Results for orders placed or performed in visit on 02/16/23  POCT URINALYSIS DIP (CLINITEK)  Result Value Ref Range   Color, UA yellow yellow   Clarity, UA clear clear   Glucose, UA negative negative mg/dL   Bilirubin, UA small (A) negative   Ketones, POC UA trace (5) (A) negative mg/dL   Spec Grav, UA >=9.147 (A) 1.010 - 1.025   Blood, UA negative negative   pH, UA 6.0 5.0 - 8.0   POC PROTEIN,UA trace negative, trace   Urobilinogen, UA 0.2 0.2 or 1.0 E.U./dL   Nitrite, UA Negative Negative   Leukocytes, UA Negative Negative    ASSESSMENT AND  PLAN:  1. Pap smear for cervical cancer screening - Cervicovaginal ancillary only - Cytology - PAP  2. Essential hypertension Diastolic blood pressure not at goal but blood pressure is improving overall.  She will continue current medications listed above including labetalol 100 mg twice a day, amlodipine 10 mg daily and spironolactone 25 mg daily.  Advised to check blood pressure at least twice a week and record the readings.  Bring readings with her on next visit to see the clinical pharmacist in 1 month.  3. Drug-induced constipation Qulipta can cause  constipation. Encourage patient to increase fiber in the diet.  Advised purchasing some prunes and eating several of them twice a week.  Use Miralax daily - polyethylene glycol powder (GLYCOLAX/MIRALAX) 17 GM/SCOOP powder; Take 17 g by mouth daily as needed (for constipation).  Dispense: 3350 g; Refill: 1  4. Dysuria POC UA today negative - POCT URINALYSIS DIP (CLINITEK) - Urine Culture  5. Influenza vaccination declined  Patient was referred for colonoscopy on last visit.  [facility] did try to contact her.  I have given her their phone number on her discharge summary on requested that she call them back to schedule her appointment.  Patient was given the opportunity to ask questions.  Patient verbalized understanding of the plan and was able to repeat key elements of the plan.   This documentation was completed using Paediatric nurse.  Any transcriptional errors are unintentional.  No orders of the defined types were placed in this encounter.    Requested Prescriptions    No prescriptions requested or ordered in this encounter    No follow-ups on file.  Jonah Blue, MD, FACP

## 2023-02-16 NOTE — Patient Instructions (Addendum)
Please call [facility] to schedule your colonoscopy.  They had called you last month and left a message to return the call.   [facility] [address] PH# [phone]   I have sent a prescription to your pharmacy for MiraLAX to use as needed for constipation. I also recommend buying some prunes and eating a few about twice a week.  Check your blood pressure at least twice a week and record the readings.  Follow-up with our clinical pharmacist in 1 month for recheck.

## 2023-02-18 LAB — URINE CULTURE

## 2023-02-19 LAB — CERVICOVAGINAL ANCILLARY ONLY
Bacterial Vaginitis (gardnerella): NEGATIVE
Candida Glabrata: NEGATIVE
Candida Vaginitis: NEGATIVE
Chlamydia: NEGATIVE
Comment: NEGATIVE
Comment: NEGATIVE
Comment: NEGATIVE
Comment: NEGATIVE
Comment: NEGATIVE
Comment: NORMAL
Neisseria Gonorrhea: NEGATIVE
Trichomonas: NEGATIVE

## 2023-02-21 ENCOUNTER — Encounter: Payer: Self-pay | Admitting: Physician Assistant

## 2023-02-23 LAB — CYTOLOGY - PAP
Adequacy: ABSENT
Comment: NEGATIVE
Comment: NEGATIVE
Comment: NEGATIVE
Diagnosis: UNDETERMINED — AB
HPV 16: NEGATIVE
HPV 18 / 45: NEGATIVE
High risk HPV: POSITIVE — AB

## 2023-02-24 ENCOUNTER — Other Ambulatory Visit: Payer: Self-pay | Admitting: Internal Medicine

## 2023-02-24 DIAGNOSIS — R87610 Atypical squamous cells of undetermined significance on cytologic smear of cervix (ASC-US): Secondary | ICD-10-CM

## 2023-02-24 DIAGNOSIS — B977 Papillomavirus as the cause of diseases classified elsewhere: Secondary | ICD-10-CM

## 2023-02-24 NOTE — Progress Notes (Signed)
Pap smear revealed some atypical cells with positive HPV virus.  This is the virus that can cause cervical cancer.  She will need to see a gynecologist for further evaluation and management.  Referral has been submitted.

## 2023-03-12 ENCOUNTER — Other Ambulatory Visit: Payer: Self-pay

## 2023-03-15 ENCOUNTER — Other Ambulatory Visit: Payer: Self-pay

## 2023-03-16 ENCOUNTER — Encounter: Payer: Self-pay | Admitting: Pharmacist

## 2023-03-16 ENCOUNTER — Ambulatory Visit: Payer: Medicaid Other | Attending: Nurse Practitioner | Admitting: Pharmacist

## 2023-03-16 ENCOUNTER — Other Ambulatory Visit: Payer: Self-pay

## 2023-03-16 VITALS — BP 121/86 | HR 72

## 2023-03-16 DIAGNOSIS — I10 Essential (primary) hypertension: Secondary | ICD-10-CM

## 2023-03-16 NOTE — Progress Notes (Signed)
   S:     57 y.o. female who presents for hypertension evaluation, education, and management.   PMH is significant for HTN, migraines, CKD, OSA, psoriasis, obesity, polycythemia, and vit D deficiency. Patient was referred and last seen by Primary Care Provider, Dr. Laural Benes, on 02/16/2023. At that visit, BP was 127/87 mmHg.   Today, patient arrives in good spirits and presents without assistance. Denies dizziness, blurred vision, swelling. Patient reports hypertension is longstanding.   Family/Social history:  Fhx: HTN, CKD, stroke Tobacco: former smoker (quit 08/2022) Alcohol: occasionally   Medication adherence reported. She has been out of all of her BP medications for the past 2 days. Patient has not taken BP medications today.   Current antihypertensives include: amlodipine 10 mg daily, labetalol 100 mg BID, spironolactone 25 mg daily   Reported home BP readings: not checking at home; just got a new cuff  O:  Vitals:   03/16/23 1453  BP: 121/86  Pulse: 72     Last 3 Office BP readings: BP Readings from Last 3 Encounters:  03/16/23 121/86  02/16/23 127/87  01/26/23 133/84    BMET    Component Value Date/Time   NA 137 01/26/2023 1515   NA 140 11/14/2022 1644   K 4.0 01/26/2023 1515   CL 102 01/26/2023 1515   CO2 28 01/26/2023 1515   GLUCOSE 104 (H) 01/26/2023 1515   BUN 22 (H) 01/26/2023 1515   BUN 22 11/14/2022 1644   CREATININE 1.82 (H) 01/26/2023 1515   CREATININE 1.16 (H) 08/24/2016 1634   CALCIUM 9.3 01/26/2023 1515   GFRNONAA 32 (L) 01/26/2023 1515   GFRNONAA 55 (L) 08/24/2016 1634   GFRAA 73 10/30/2019 1708   GFRAA 63 08/24/2016 1634    Renal function: CrCl cannot be calculated (Patient's most recent lab result is older than the maximum 21 days allowed.).  Clinical ASCVD: No  The 10-year ASCVD risk score (Arnett DK, et al., 2019) is: 4.9%   Values used to calculate the score:     Age: 71 years     Sex: Female     Is Non-Hispanic African American:  Yes     Diabetic: No     Tobacco smoker: No     Systolic Blood Pressure: 121 mmHg     Is BP treated: Yes     HDL Cholesterol: 50 mg/dL     Total Cholesterol: 181 mg/dL  A/P: Hypertension diagnosed currently above goal. SBP is at goal, however, DBP is consistently above goal. BP goal < 130/80 mmHg. Medication adherence appears appropriate prior to this past week. Home bloos pressure values are similar to what we're getting in clinic. Will have her return in 1 month and if DBP is >80, we will increase medication. -Continued amlodipine 10 mg daily -Continued labetalol 100 mg BID - Continued spironolactone 25 mg daily .  -F/u labs ordered - none -Counseled on lifestyle modifications for blood pressure control including reduced dietary sodium, increased exercise, adequate sleep. -Encouraged patient to check BP at home and bring log of readings to next visit. Counseled on proper use of home BP cuff.   Results reviewed and written information provided.    Written patient instructions provided. Patient verbalized understanding of treatment plan.  Total time in face to face counseling 20 minutes.    Follow-up:  Pharmacist in one month. PCP clinic visit on 04/20/2023.   Butch Penny, PharmD, Patsy [place], CPP Clinical Pharmacist [facility] [phone]

## 2023-03-18 NOTE — Progress Notes (Unsigned)
03/20/23- 37 yoF former smoker for sleep evaluation courtesy of Dr Jonah Blue with concern of OSA on CPAP for [employer]. Medical problem list includes Migraine, HTN, GERD, Psoriasis, Osteoarthritis, CKD3b, Depression, Polycythemia secondary, Leukocytosis, Obesity,  NPSG 07/04/22- AHI 63.4/hr, desaturation to 66%/mean 89.7%, body weight 232 lbs CPAP to19 on 4/22 Epworth score- 17 Body weight today-222 lbs

## 2023-03-20 ENCOUNTER — Encounter: Payer: Self-pay | Admitting: Internal Medicine

## 2023-03-20 ENCOUNTER — Ambulatory Visit: Payer: Managed Care, Other (non HMO) | Admitting: Internal Medicine

## 2023-03-20 VITALS — BP 138/86 | HR 71 | Ht 60.0 in | Wt 222.4 lb

## 2023-03-20 DIAGNOSIS — F17200 Nicotine dependence, unspecified, uncomplicated: Secondary | ICD-10-CM | POA: Diagnosis not present

## 2023-03-20 DIAGNOSIS — E66813 Obesity, class 3: Secondary | ICD-10-CM

## 2023-03-20 DIAGNOSIS — G4733 Obstructive sleep apnea (adult) (pediatric): Secondary | ICD-10-CM

## 2023-03-20 DIAGNOSIS — D751 Secondary polycythemia: Secondary | ICD-10-CM | POA: Diagnosis not present

## 2023-03-20 DIAGNOSIS — Z6841 Body Mass Index (BMI) 40.0 and over, adult: Secondary | ICD-10-CM

## 2023-03-20 NOTE — Patient Instructions (Signed)
Order- Dme [employer] please change CPAP to auto 5-20, mask of choice, humidifier, supplies, and please add download capability AirView/ card  Please call if we can help

## 2023-03-21 ENCOUNTER — Encounter: Payer: Self-pay | Admitting: Internal Medicine

## 2023-03-21 NOTE — Assessment & Plan Note (Signed)
Followed by hematology Plan-anticipate overnight oximetry on CPAP once stable.

## 2023-03-21 NOTE — Assessment & Plan Note (Signed)
Reports smoking cessation April 2024 Plan-anticipate future need for PFT, overnight oximetry on CPAP

## 2023-03-21 NOTE — Assessment & Plan Note (Signed)
External support program for weight loss likely to be helpful.

## 2023-03-21 NOTE — Assessment & Plan Note (Addendum)
Benefits from CPAP.  Need download capability. Additional concern was polycythemia-will need oximetry once stable on CPAP. Plan-set at auto 5-20, add download capability, refit mask of choice

## 2023-03-30 ENCOUNTER — Other Ambulatory Visit: Payer: Self-pay

## 2023-04-02 ENCOUNTER — Other Ambulatory Visit: Payer: Self-pay

## 2023-04-03 ENCOUNTER — Other Ambulatory Visit: Payer: Self-pay

## 2023-04-04 ENCOUNTER — Other Ambulatory Visit: Payer: Self-pay

## 2023-04-05 ENCOUNTER — Other Ambulatory Visit: Payer: Self-pay

## 2023-04-18 ENCOUNTER — Ambulatory Visit (INDEPENDENT_AMBULATORY_CARE_PROVIDER_SITE_OTHER): Payer: Medicaid Other | Admitting: Psychology

## 2023-04-18 ENCOUNTER — Ambulatory Visit: Payer: Managed Care, Other (non HMO) | Admitting: Psychology

## 2023-04-18 ENCOUNTER — Encounter: Payer: Self-pay | Admitting: Psychology

## 2023-04-18 DIAGNOSIS — F09 Unspecified mental disorder due to known physiological condition: Secondary | ICD-10-CM

## 2023-04-18 DIAGNOSIS — F332 Major depressive disorder, recurrent severe without psychotic features: Secondary | ICD-10-CM | POA: Diagnosis not present

## 2023-04-18 DIAGNOSIS — R4189 Other symptoms and signs involving cognitive functions and awareness: Secondary | ICD-10-CM

## 2023-04-18 DIAGNOSIS — F411 Generalized anxiety disorder: Secondary | ICD-10-CM | POA: Diagnosis not present

## 2023-04-18 NOTE — Progress Notes (Signed)
NEUROPSYCHOLOGICAL EVALUATION [facility] Department of Neurology  Date of Evaluation: April 18, 2023  Reason for Referral:   Desera Dummer Retz is a 57 y.o. right-handed African-American female referred by Shon Millet, D.O., to characterize her current cognitive functioning and assist with diagnostic clarity and treatment planning in the context of subjective cognitive decline, prominent migraine headaches, and significant psychiatric comorbidities.   Assessment and Plan:   Clinical Impression(s): Scores across stand-alone and embedded performance validity measures were frequently below expectation. Generally speaking, raw performances were at-chance or slightly above and not suggestive of active malingering or purposeful poor performance. It would appear more likely that the severity of ongoing psychiatric distress greatly impeded her ability to focus on the tasks at hand and put forth full and complete effort. Given the volume of below expectation validity performances, the current performances should be interpreted with significant caution and likely not at face value as there is a strong likelihood that current scores underestimate her true abilities to an unknown and potentially significant degree.  Across mood-related questionnaires, Ms. Pierrelouis's responses suggested acute symptoms of severe depression and anxiety. This mirrors Ms. Dolezal's responses during interview. Across a more lengthy personality questionnaire, subscale elevations also suggested severe depression and anxiety. It further suggested severe somatic preoccupation and features of borderline personality disorder. While she elevated the schizophrenia subscale, I do not have concerns for the presence of this illness. This elevation likely occurred due to her report of feeling mentally scattered and experiencing trouble with focus, concentration, and cognition in general. It is important to highlight  that cognitive dysfunction/impairment can certainly be seen in individuals who are experiencing severe psychiatric distress. Primary areas of difficulty would reasonably surround processing speed, attention/concentration, executive functioning, and encoding/retrieval aspects of memory. While I am unable to provide an objective quantification of current cognitive abilities due to validity concerns, the severity of ongoing psychiatric distress would appear the most likely culprit for subjective cognitive dysfunction. Difficulties would be exacerbated by frequent headaches, sleep dysfunction, and mild to moderate microvascular ischemic disease as seen across her most recent brain MRI.   Based upon her age alone, symptomatic Alzheimer's disease would be highly unlikely. It is further worth pointing out that retention rates across memory tasks ranged from 80% to 133%, which does not suggest evidence for rapid forgetting, making the presence of this illness extremely unlikely. She did not report common behavioral symptoms during interview to further raise concern for other neurodegenerative illnesses such as Lewy body disease, another more rare parkinsonian presentation, or frontotemporal lobar degeneration.   Recommendations: Ms. Beneke is encouraged to speak with her prescribing physician regarding medication adjustments to optimally manage severe psychiatric distress. She would likely benefit from a referral to a psychiatrist. She could contact some of these resources to see if they are accepting new patients and to see if they accept her insurance:  Dr. Ardeth Sportsman - [phone] [facility]) - [phone] [facility] [place]) - [phone] Dr. Milagros Evener [facility]) [phone] [facility] [place]) [phone] [facility] [place]) - [phone] [facility] [place]) [phone] [facility], [address], [place], [place] [phone] Dr. Dionisio David (neuropsychiatry); Franchot Erichsen; [facility]; 9th Floor; [place], [place] [phone] Dr. Neysa Hotter; [facility]; [address]; [place], [place] [phone]  Likewise, Ms. Trickey is strongly encouraged to engage in individual psychotherapy to address symptoms of psychiatric distress. She  would benefit from an active and collaborative therapeutic environment, rather than one purely supportive in nature. Recommended treatment modalities include Cognitive Behavioral Therapy (CBT) or Acceptance and Commitment Therapy (ACT).  Ms. Yeakel is encouraged to attend to lifestyle factors for brain health (e.g., regular physical exercise, good nutrition habits and consideration of the MIND-DASH diet, regular participation in cognitively-stimulating activities, and general stress management techniques), which are likely to have benefits for both emotional adjustment and cognition. In fact, in addition to promoting good general health, regular exercise incorporating aerobic activities (e.g., brisk walking, jogging, cycling, etc.) has been demonstrated to be a very effective treatment for depression and stress, with similar efficacy rates to both antidepressant medication and psychotherapy. Optimal control of vascular risk factors (including safe cardiovascular exercise and adherence to dietary recommendations) is encouraged. Likewise, continued compliance with her CPAP machine will also be important. Continued participation in activities which provide mental stimulation and social interaction is also recommended.   Memory can be improved using internal strategies such as rehearsal, repetition, chunking, mnemonics, association, and imagery. External strategies such as written notes in a consistently used memory journal, visual and nonverbal  auditory cues such as a calendar on the refrigerator or appointments with alarm, such as on a cell phone, can also help maximize recall.    When learning new information, she would benefit from information being broken up into small, manageable pieces. She may also find it helpful to articulate the material in her own words and in a context to promote encoding at the onset of a new task. This material may need to be repeated multiple times to promote encoding. She may understand and retain new information better if it is presented to her in a meaningful or well-organized manner at the outset, such as grouping items into meaningful categories or presenting information in an outlined, bulleted, or story format.  To address problems with processing speed, she may wish to consider:   -Ensuring that she is alerted when essential material or instructions are being presented   -Adjusting the speed at which new information is presented   -Allowing for more time in comprehending, processing, and responding in conversation   -Repeating and paraphrasing instructions or conversations aloud  To address problems with fluctuating attention and/or executive dysfunction, she may wish to consider:   -Avoiding external distractions when needing to concentrate   -Limiting exposure to fast paced environments with multiple sensory demands   -Writing down complicated information and using checklists   -Attempting and completing one task at a time (i.e., no multi-tasking)   -Verbalizing aloud each step of a task to maintain focus   -Taking frequent breaks during the completion of steps/tasks to avoid fatigue   -Reducing the amount of information considered at one time   -Scheduling more difficult activities for a time of day where she is usually most alert  Review of Records:   Ms. Dowson was seen by [facility] Shon Millet, D.O.) on 07/14/2021 for an evaluation of new onset headaches. Briefly, Ms. Gilkey began  experiencing persistent headaches and visual disturbances in October 2022. She described a 9/10 throbbing frontal/temporal headache with photophobia and some blurred vision, confusion, slurred speech, and balance instability. No nausea, vomiting, double vision, visual obscurations, pulsatile tinnitus, or numbness/weakness was described. Headaches were said to wake her up out of sleep. She was seen in the ED on three occasions between 04/04/2021 and 04/14/2021 due to symptoms. Systolic blood pressure had been from 159 to the 200s. Head CT  on 04/04/2021 revealed chronic small vessel ischemic changes but no intracranial abnormalities. A follow up MRV of the head was negative for dural venous thrombosis or stenosis. Just prior to the onset of headaches, the father of her children passed away which caused a great deal of emotional distress. She was started on daily topiramate and Fioricet. Symptoms were then described as 3-4/10, lasting 15-20 minutes, occurring every 3 to 4 days (treats with ASA or ibuprofen daily).  She most recently met with Dr. Everlena Cooper on 01/15/2023. In the interim, she had described generalized memory dysfunction and subjective cognitive decline. Topiramate was removed due to potential side effects and her medications adjusted. However, she continued to describe ongoing difficulties (e.g., gets lost driving on familiar routes or forgets to take her medications). Ultimately, Ms. Coyt was referred for a comprehensive neuropsychological evaluation to characterize her cognitive abilities and to assist with diagnostic clarity and treatment planning.   Neuroimaging Some imaging results are described above. Brain MRI on 10/14/2021 revealed mild to moderate microvascular ischemic disease, as well as concerns for gliosis along the splenium of the corpus callosum.   Past Medical History:  Diagnosis Date   Chest pain 10/04/2007   Esophageal reflux 08/02/2006   Essential hypertension, benign 08/02/2006    Generalized anxiety disorder    Hypertensive urgency 07/10/2022   Leukocytosis 05/26/2022   Major depressive disorder 05/31/2021   Migraine with aura and without status migrainosus, not intractable 05/31/2021   Morbid obesity with body mass index (BMI) of 40.0 to 44.9 in adult 12/13/2021   OSA (obstructive sleep apnea) 06/01/2022   NPSG 07/04/22- AHI 63.4/hr, desaturation to 66%/mean 89.7%, body      Pap smear abnormality of cervix/human papillomavirus (HPV) positive 11/06/2019   Perimenopausal 03/06/2019   Polycythemia, secondary 05/26/2022   Prediabetes 04/04/2019   Primary osteoarthritis of right knee 12/13/2021   Psoriasis 03/06/2019   Stage 3b chronic kidney disease 05/03/2022   Trichomonas infection 11/06/2019   Vitamin D deficiency 09/29/2015    Past Surgical History:  Procedure Laterality Date   CESAREAN SECTION     x2    Current Outpatient Medications:    amLODipine (NORVASC) 10 MG tablet, Take 1 tablet (10 mg total) by mouth once nightly at bedtime., Disp: 90 tablet, Rfl: 1   aspirin EC 81 MG tablet, Take 81 mg by mouth every 4 (four) hours as needed for moderate pain. Swallow whole. (Patient not taking: Reported on 02/16/2023), Disp: , Rfl:    Atogepant (QULIPTA) 60 MG TABS, Take 1 tablet (60 mg total) by mouth daily., Disp: 30 tablet, Rfl: 11   buPROPion (WELLBUTRIN SR) 150 MG 12 hr tablet, Take 1 tablet (150 mg total) by mouth 2 (two) times daily., Disp: 180 tablet, Rfl: 1   labetalol (NORMODYNE) 100 MG tablet, Take 1 tablet (100 mg total) by mouth 2 (two) times daily., Disp: 180 tablet, Rfl: 1   polyethylene glycol powder (GLYCOLAX/MIRALAX) 17 GM/SCOOP powder, Take 17 g by mouth daily as needed (for constipation)., Disp: 3350 g, Rfl: 1   sertraline (ZOLOFT) 50 MG tablet, Take 1 tablet (50 mg total) by mouth daily., Disp: 90 tablet, Rfl: 1   spironolactone (ALDACTONE) 25 MG tablet, Take 1 tablet (25 mg total) by mouth daily., Disp: 30 tablet, Rfl: 6   traMADol (ULTRAM)  50 MG tablet, Take 1 tablet (50 mg total) by mouth every 12 (twelve) hours as needed. (Patient not taking: Reported on 02/16/2023), Disp: 30 tablet, Rfl: 2   Ubrogepant (UBRELVY) 100 MG  TABS, Take 1 tablet (100 mg total) by mouth as needed. May repeat after 2 hours.  Maximum 2 tablets in 24 hours., Disp: 10 tablet, Rfl: 11  Clinical Interview:   The following information was obtained during a clinical interview with Ms. Shuttleworth prior to cognitive testing.  Cognitive Symptoms: Decreased short-term memory: Endorsed. She largely described generalized memory concerns. With direct cueing, she reported trouble recalling details of past conversations and names of familiar individuals. She also noted trouble recalling her mother's birthday and the day she passed, as well as an isolated instance where she forgot both how to get into her car and how to turn on and operate her vehicle. Difficulties were said to be present for the past year or so and have seemed stable over time.  Decreased long-term memory: Denied. Decreased attention/concentration: Endorsed. She reported prominent difficulties with sustained focus and increased distractibility.  Reduced processing speed: Endorsed. Difficulties with executive functions: Endorsed. She reported prominent difficulties with organization, multi-tasking, and decision making. She denied trouble with impulsivity, as well as any significant personality changes.  Difficulties with emotion regulation: Denied. Difficulties with receptive language: Denied. Difficulties with word finding: Endorsed. Decreased visuoperceptual ability: Denied.  Difficulties completing ADLs: Somewhat. She reported organizing her medications in a pill pack but will have occasions where she forgets to take her medications. She denied trouble with financial management or bill paying. She continues to drive. While she reported navigational concerns and prior instances where she has briefly gotten  turned around and lost, she denied any safety concerns.   Additional Medical History: History of traumatic brain injury/concussion: Denied. History of stroke: Denied. History of seizure activity: Denied. History of known exposure to toxins: Denied. Symptoms of chronic pain: Denied outside of age-related aches and pains involving her joints.  Experience of frequent headaches/migraines: Endorsed (see above). She reported migraine headaches currently impacting her every other day and symptoms can be severe in nature. Medication intervention was said to be somewhat helpful thus far.  Frequent instances of dizziness/vertigo: Endorsed. She noted symptoms when she stands quickly, when she takes certain unspecified medications, and also at seemingly random time points.   Sensory changes: She is nearsighted and is awaiting a new pair of glasses. No reading difficulties were noted. She reported mild hearing loss, as well as concern that some of her taste preferences had changed lately.  Balance/coordination difficulties: She reported experiencing "a little bit" of balance instability while ambulating. The cause for this was unknown. Her right side was said to exhibit somewhat greater instability. She denied any recent falls.  Other motor difficulties: Denied. She also denied any grip strength differences.   Sleep History: Estimated hours obtained each night: Less than 8.  Difficulties falling asleep: Denied. Difficulties staying asleep: Endorsed. Feels rested and refreshed upon awakening: Denied.  History of snoring: Endorsed. History of waking up gasping for air: Endorsed. Witnessed breath cessation while asleep: Endorsed. She was relatively recently diagnosed with obstructive sleep apnea via a sleep study and prescribed a CPAP machine. She has been using this device nightly for the past month or so and is still working on calibrating her machine and getting a good mask fit.   History of vivid  dreaming: Denied. Excessive movement while asleep: Denied. Instances of acting out her dreams: Denied.  Psychiatric/Behavioral Health History: Depression: She described her current mood as "depressed," noting a longstanding history of, at times, quite severe depressive experiences. In the somewhat recent past, medical records suggest the passing of her children's  father which created a great deal of emotional distress. This timing also aligns with onset of headache symptoms. She noted that current medication efforts did not seem strongly helpful. She has worked with a Paramedic in the past and found it beneficial. This therapist reportedly stated that Ms. Silversmith required "long-term therapy." Ms. Maybee was unsure what was meant by this but noted being in the process of searching for a new therapist. Current or remote suicidal ideation, intent, or plan was denied.  Anxiety: She described a longstanding history of generalized anxious distress, largely coinciding with depressive experiences.  Mania: Denied. Trauma History: Denied. Visual/auditory hallucinations: Denied. Delusional thoughts: Denied.  Tobacco: Denied. Alcohol: She reported occasional alcohol consumption and denied a history of problematic alcohol abuse or dependence.  Recreational drugs: Denied.  Family History: Problem Relation Age of Onset   Hypertension Mother    Kidney disease Mother    Alcohol abuse Father    Depression Father    Drug abuse Father    Hypertension Father    Hypertension Maternal Grandmother    Stroke Maternal Grandmother    Dementia Maternal Grandmother    Alzheimer's disease Maternal Grandfather    Hypertension Daughter    This information was confirmed by Ms. Susan.  Academic/Vocational History: Highest level of educational attainment: 10 years. She left high school after completing the 10th grade. She was somewhat vague when providing details but did suggest a death in the family and her needing to  provide additional support. She described herself as a good (A/B) student in academic settings. While she has started the GED process in the past, she reported that she has never completed this process. Math was noted as a relative weakness in academic settings.  History of developmental delay: Denied. History of grade repetition: Denied. Enrollment in special education courses: Denied. History of LD/ADHD: Denied.  Employment: Unemployed. 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 She is currently unemployed and reported "waiting on disability."  Evaluation Results:   Behavioral Observations: Ms. Genco was unaccompanied, arrived to her appointment on time, and was appropriately dressed and groomed. She appeared alert and oriented. Observed gait and station were within normal limits. Gross motor functioning appeared intact upon informal observation and no abnormal movements (e.g., tremors) were noted. Her affect was generally relaxed and positive. Spontaneous speech was fluent and word finding difficulties were not observed during the clinical interview. Thought processes were coherent, organized, and normal in content. Insight into her cognitive difficulties appeared adequate.   During testing, Ms. Curtiss completed tasks at an exceptionally slow pace, often necessitating the psychometrist to provide prompts for her to provide a response. A short form version of a lengthy questionnaire was additionally substituted due her response rate. Sustained attention was appropriate. Task engagement was adequate. Overall, Ms. Crooke was cooperative with the clinical interview and subsequent testing procedures.   Adequacy of Effort: The validity of neuropsychological testing is limited by the extent to which the individual being tested may be assumed to have exerted adequate effort during testing. Ms. Mix expressed her intention to  perform to the best of her abilities and exhibited adequate task engagement and persistence. Scores across stand-alone and embedded performance validity measures were largely below expectation. Generally speaking, raw performances were at-chance or slightly above and not suggestive of active malingering or purposeful poor performance. It would appear more likely that the severity of ongoing psychiatric distress created difficulties with sustained focus and attention. Given  the volume of below expectation validity performances, the current performances should be interpreted with significant caution as there is a strong likelihood that current scores underestimate her true abilities to an unknown and potentially significant degree.  Test Results: Results are described below to complete her current report. As stated above, these should be interpreted with significant caution and likely not at face value due to validity concerns throughout the evaluation.  Ms. Flanigan was largely oriented at the time of the current evaluation.  Intellectual abilities based upon educational and vocational attainment were estimated to be in the average range. Premorbid abilities were estimated to be within the well below average range based upon a single-word reading test.   Processing speed was exceptionally low to well below average. Basic attention was below average. More complex attention (e.g., working memory) was well below average. Executive functioning was exceptionally low to well below average.  While not directly assessed, receptive language abilities were believed to be intact. Ms. Cartmell did not exhibit any difficulties comprehending task instructions and answered all questions asked of her appropriately. Assessed expressive language (e.g., verbal fluency and confrontation naming) was well below average to below average.     Assessed visuospatial/visuoconstructional abilities were variable, ranging from the well  below average to average normative ranges.    Learning (i.e., encoding) of novel verbal and visual information was exceptionally low to below average. Spontaneous delayed recall (i.e., retrieval) of previously learned information was well below average to average. Retention rates were 96% across a story learning task, 133% across a list learning task, 80% across a daily living task, and 100% across a shape learning task. Performance across recognition tasks was exceptionally low to well below average, suggesting limited evidence for information consolidation.   Results of emotional screening instruments suggested that recent symptoms of generalized anxiety were in the severe range, while symptoms of depression were also within the severe range. Across a more comprehensive personality questionnaire, she elevated numerous clinical subscales surrounding somatic complaints, anxiety, anxiety-related disorders, depression, schizophrenia, and borderline features. A screening instrument assessing recent sleep quality suggested the presence of moderate sleep dysfunction.  Tables of Scores:   Note: This summary of test scores accompanies the interpretive report and should not be considered in isolation without reference to the appropriate sections in the text. Descriptors are based on appropriate normative data and may be adjusted based on clinical judgment. Terms such as "Within Normal Limits" and "Outside Normal Limits" are used when a more specific description of the test score cannot be determined.       Percentile - Normative Descriptor > 98 - Exceptionally High 91-97 - Well Above Average 75-90 - Above Average 25-74 - Average 9-24 - Below Average 2-8 - Well Below Average < 2 - Exceptionally Low       Validity:   DESCRIPTOR       TOMM: --- --- ---    T1 --- --- Outside Normal Limits    T2 --- --- Within Normal Limits    Retention --- --- Within Normal Limits  ACS WC: --- --- Outside Normal Limits   DCT: --- --- Outside Normal Limits  NAB EVI: --- --- Outside Normal Limits  D-KEFS Color Word EI: --- --- Outside Normal Limits       Orientation:      Raw Score Percentile   NAB Orientation, Form 1 28/29 --- ---       Cognitive Screening:      Raw Score Percentile   SLUMS:  14/30 --- ---       Intellectual Functioning:      Standard Score Percentile   Test of Premorbid Functioning: 79 8 Well Below Average       Memory:     NAB Memory Module, Form 1: Standard Score/ T Score Percentile   Total Memory Index 71 3 Well Below Average  List Learning       Total Trials 1-3 13/36 (32) 4 Well Below Average    List B 4/12 (50) 50 Average    Short Delay Free Recall 3/12 (33) 5 Well Below Average    Long Delay Free Recall 4/12 (38) 12 Below Average    Retention Percentage 133 (62) 88 Above Average    Recognition Discriminability -3 (<19) <1 Exceptionally Low  Shape Learning       Total Trials 1-3 7/27 (25) 1 Exceptionally Low    Delayed Recall 3/9 (36) 8 Well Below Average    Retention Percentage 100 (50) 50 Average    Recognition Discriminability 3 (27) 1 Exceptionally Low  Story Learning       Immediate Recall 41/80 (34) 5 Well Below Average    Delayed Recall 23/40 (37) 9 Below Average    Retention Percentage 96 (52) 58 Average  Daily Living Memory       Immediate Recall 37/51 (42) 21 Below Average    Delayed Recall 12/17 (43) 25 Average    Retention Percentage 80 (40) 16 Below Average    Recognition Hits 6/10 (28) 2 Well Below Average       Attention/Executive Function:     Trail Making Test (TMT): Raw Score (T Score) Percentile     Part A 69 secs.,  0 errors (35) 7 Well Below Average    Part B 283 secs.,  4 errors (33) 5 Well Below Average         Scaled Score Percentile   WAIS-IV Coding: 4 2 Well Below Average       NAB Attention Module, Form 1: T Score Percentile     Digits Forward 37 9 Below Average    Digits Backwards 34 5 Well Below Average       D-KEFS  Color-Word Interference Test: Raw Score (Scaled Score) Percentile     Color Naming 62 secs. (1) <1 Exceptionally Low    Word Reading 63 secs. (1) <1 Exceptionally Low    Inhibition 106 secs. (1) <1 Exceptionally Low      Total Errors 8 errors (1) <1 Exceptionally Low    Inhibition/Switching 148 secs. (1) <1 Exceptionally Low      Total Errors 8 errors (4) 2 Well Below Average       D-KEFS Verbal Fluency Test: Raw Score (Scaled Score) Percentile     Letter Total Correct 18 (4) 2 Well Below Average    Category Total Correct 16 (1) <1 Exceptionally Low    Category Switching Total Correct 5 (1) <1 Exceptionally Low    Category Switching Accuracy 4 (2) <1 Exceptionally Low      Total Set Loss Errors 1 (11) 63 Average      Total Repetition Errors 3 (10) 50 Average       Language:     Verbal Fluency Test: Raw Score (T Score) Percentile     Phonemic Fluency (FAS) 18 (40) 16 Below Average    Animal Fluency 9 (36) 8 Well Below Average        NAB Language Module, Form 1: T Score Percentile  Naming 27/31 (35) 7 Well Below Average       Visuospatial/Visuoconstruction:     NAB Spatial Module, Form 1: T Score Percentile     Figure Drawing Copy 45 31 Average        Scaled Score Percentile   WAIS-IV Block Design: 8 25 Average  WAIS-IV Matrix Reasoning: 5 5 Well Below Average       Mood and Personality:      Raw Score Percentile   Beck Depression Inventory - II: 46 --- Severe  PROMIS Anxiety Questionnaire: 30 --- Severe       Personality Assessment Inventory-SF: T Score  Percentile     Inconsistency --- --- ---    Infrequency 55 --- Within Normal Limits    Negative Impression 62 --- Within Normal Limits    Positive Impression 43 --- Within Normal Limits    Somatic Complaints 85 --- Elevated    Anxiety 87 --- Elevated    Anxiety-Related Disorders 83 --- Elevated    Depression 85 --- Elevated    Mania 50 --- Within Normal Limits    Paranoia 58 --- Within Normal Limits     Schizophrenia 72 --- Elevated    Borderline Features 76 --- Elevated    Antisocial Features 45 --- Within Normal Limits    Alcohol Problems 45 --- Within Normal Limits    Drug Problems 59 --- Within Normal Limits    Aggression 44 --- Within Normal Limits    Suicidal Ideation 57 --- Within Normal Limits    Stress --- --- ---    Non Support 66 --- Within Normal Limits    Treatment Rejection 33 --- Within Normal Limits    Dominance 55 --- Within Normal Limits    Warmth 62 --- Within Normal Limits       Additional Questionnaires:      Raw Score Percentile   PROMIS Sleep Disturbance Questionnaire: 36 --- Moderate   Informed Consent and Coding/Compliance:   The current evaluation represents a clinical evaluation for the purposes previously outlined by the referral source and is in no way reflective of a forensic evaluation.   Ms. Elko was provided with a verbal description of the nature and purpose of the present neuropsychological evaluation. Also reviewed were the foreseeable risks and/or discomforts and benefits of the procedure, limits of confidentiality, and mandatory reporting requirements of this provider. The patient was given the opportunity to ask questions and receive answers about the evaluation. Oral consent to participate was provided by the patient.   This evaluation was conducted by Newman Nickels, Ph.D., ABPP-CN, board certified clinical neuropsychologist. Ms. Jobson completed a clinical interview with Dr. Milbert Coulter, billed as one unit [phone], and 245 minutes of cognitive testing and scoring, billed as one unit [phone] and seven additional units [id number]. Psychometrist Wallace Keller, B.S. assisted Dr. Milbert Coulter with test administration and scoring procedures. As a separate and discrete service, one unit [id number] and two units [phone] were billed for Dr. Tammy Sours time spent in interpretation and report writing.

## 2023-04-18 NOTE — Progress Notes (Signed)
   Psychometrician Note   Cognitive testing was administered to Janice [name] by Wallace Keller, B.S. (psychometrist) under the supervision of Dr. Newman Nickels, Ph.D., licensed psychologist on 04/18/2023. Janice [name] did not appear overtly distressed by the testing session per behavioral observation or responses across self-report questionnaires. Rest breaks were offered.    The battery of tests administered was selected by Dr. Newman Nickels, Ph.D. with consideration to Janice [name]'s current level of functioning, the nature of her symptoms, emotional and behavioral responses during interview, level of literacy, observed level of motivation/effort, and the nature of the referral question. This battery was communicated to the psychometrist. Communication between Dr. Newman Nickels, Ph.D. and the psychometrist was ongoing throughout the evaluation and Dr. Newman Nickels, Ph.D. was immediately accessible at all times. Dr. Newman Nickels, Ph.D. provided supervision to the psychometrist on the date of this service to the extent necessary to assure the quality of all services provided.    Janice [name] will return within approximately 1-2 weeks for an interactive feedback session with Dr. Milbert Coulter at which time her test performances, clinical impressions, and treatment recommendations will be reviewed in detail. Janice [name] understands she can contact our office should she require our assistance before this time.  A total of 245 minutes of billable time were spent face-to-face with Janice [name] by the psychometrist. This includes both test administration and scoring time. Billing for these services is reflected in the clinical report generated by Dr. Newman Nickels, Ph.D.  This note reflects time spent with the psychometrician and does not include test scores or any clinical interpretations made by Dr. Milbert Coulter. The full report will follow in a separate note.

## 2023-04-20 ENCOUNTER — Ambulatory Visit: Payer: Commercial Managed Care - HMO | Admitting: Pharmacist

## 2023-04-25 ENCOUNTER — Telehealth: Payer: Self-pay | Admitting: Internal Medicine

## 2023-04-25 NOTE — Telephone Encounter (Signed)
PC placed to pt today to go over results of neuropsychiatric eval that was done by Dr. Milbert Coulter.  LVMM informing of who I am.  Will try to reach her again later.

## 2023-05-01 ENCOUNTER — Other Ambulatory Visit: Payer: Self-pay

## 2023-05-01 ENCOUNTER — Encounter: Payer: Self-pay | Admitting: Obstetrics and Gynecology

## 2023-05-01 ENCOUNTER — Ambulatory Visit: Payer: Commercial Managed Care - HMO | Admitting: Obstetrics and Gynecology

## 2023-05-01 VITALS — BP 148/93 | HR 71 | Ht 60.0 in | Wt 221.0 lb

## 2023-05-01 DIAGNOSIS — Z3202 Encounter for pregnancy test, result negative: Secondary | ICD-10-CM | POA: Diagnosis not present

## 2023-05-01 DIAGNOSIS — N882 Stricture and stenosis of cervix uteri: Secondary | ICD-10-CM | POA: Diagnosis not present

## 2023-05-01 DIAGNOSIS — R87810 Cervical high risk human papillomavirus (HPV) DNA test positive: Secondary | ICD-10-CM

## 2023-05-01 DIAGNOSIS — R87610 Atypical squamous cells of undetermined significance on cytologic smear of cervix (ASC-US): Secondary | ICD-10-CM | POA: Diagnosis not present

## 2023-05-01 DIAGNOSIS — N952 Postmenopausal atrophic vaginitis: Secondary | ICD-10-CM

## 2023-05-01 LAB — POCT URINE PREGNANCY: Preg Test, Ur: NEGATIVE

## 2023-05-01 MED ORDER — ESTROGENS CONJUGATED 0.625 MG/GM VA CREA
1.0000 | TOPICAL_CREAM | VAGINAL | 12 refills | Status: DC
  Filled 2023-05-01: qty 30, 30d supply, fill #0
  Filled 2023-06-04: qty 30, 30d supply, fill #1

## 2023-05-01 NOTE — Progress Notes (Signed)
Colposcopy note  Patient given informed consent, signed copy in the chart, time out was performed.  Chart reviewed and ASCUS pap with positive high risk HPV was noted.  HPV 16/18 were negative.  Placed in lithotomy position. Cervix viewed with speculum and colposcope after application of acetic acid and lugol's solution.  Vagina appeared normal, but cervix was atrophic in appearance and stenotic.   Colposcopy adequate?  no Acetowhite lesions?: none Non staining with Lugol's:  negative Punctation?  none Mosaicism?  none Abnormal vasculature?  none Biopsies?none ECC?attempted  ECC, but cervix too stenotic for good entry.  COMMENTS: No obvious changes related to dysplasia. Endocervical canal not well sampled. Will have 6 months of weekly premarin cream and then repap cervix  Warden Fillers, MD

## 2023-05-02 ENCOUNTER — Other Ambulatory Visit (HOSPITAL_COMMUNITY)
Admission: RE | Admit: 2023-05-02 | Discharge: 2023-05-02 | Disposition: A | Discharge status: Home / Self Care | Payer: Commercial Managed Care - HMO | Source: Ambulatory Visit | Attending: Obstetrics and Gynecology | Admitting: Obstetrics and Gynecology

## 2023-05-02 DIAGNOSIS — R87610 Atypical squamous cells of undetermined significance on cytologic smear of cervix (ASC-US): Secondary | ICD-10-CM | POA: Diagnosis present

## 2023-05-02 DIAGNOSIS — N882 Stricture and stenosis of cervix uteri: Secondary | ICD-10-CM | POA: Diagnosis present

## 2023-05-02 DIAGNOSIS — R87810 Cervical high risk human papillomavirus (HPV) DNA test positive: Secondary | ICD-10-CM | POA: Diagnosis present

## 2023-05-02 NOTE — Addendum Note (Signed)
Addended by: Marya Landry D on: 05/02/2023 02:58 PM   Modules accepted: Orders

## 2023-05-07 LAB — SURGICAL PATHOLOGY

## 2023-05-08 ENCOUNTER — Other Ambulatory Visit: Payer: Self-pay

## 2023-05-09 ENCOUNTER — Other Ambulatory Visit: Payer: Self-pay

## 2023-05-09 ENCOUNTER — Ambulatory Visit (INDEPENDENT_AMBULATORY_CARE_PROVIDER_SITE_OTHER): Payer: Commercial Managed Care - HMO | Admitting: Psychology

## 2023-05-09 DIAGNOSIS — F332 Major depressive disorder, recurrent severe without psychotic features: Secondary | ICD-10-CM

## 2023-05-09 DIAGNOSIS — F411 Generalized anxiety disorder: Secondary | ICD-10-CM | POA: Diagnosis not present

## 2023-05-09 DIAGNOSIS — F09 Unspecified mental disorder due to known physiological condition: Secondary | ICD-10-CM | POA: Diagnosis not present

## 2023-05-09 NOTE — Progress Notes (Signed)
   Neuropsychology Feedback Session Janice [name]. [facility] [place] Department of Neurology  Reason for Referral:   Janice [name] is a 57 y.o. right-handed African-American female referred by Shon Millet, D.O., to characterize her current cognitive functioning and assist with diagnostic clarity and treatment planning in the context of subjective cognitive decline, prominent migraine headaches, and significant psychiatric comorbidities.   Feedback:   [name] completed a comprehensive neuropsychological evaluation on 06/17/2022. Please refer to that encounter for the full report and recommendations. Briefly, scores across stand-alone and embedded performance validity measures were frequently below expectation. Generally speaking, raw performances were at-chance or slightly above and not suggestive of active malingering or purposeful poor performance. It would appear more likely that the severity of ongoing psychiatric distress greatly impeded her ability to focus on the tasks at hand and put forth full and complete effort. Given the volume of below expectation validity performances, the current performances should be interpreted with significant caution and likely not at face value as there is a strong likelihood that current scores underestimate her true abilities to an unknown and potentially significant degree. Across mood-related questionnaires, [name]'s responses suggested acute symptoms of severe depression and anxiety. This mirrors [name] responses during interview. Across a more lengthy personality questionnaire, subscale elevations also suggested severe depression and anxiety. It further suggested severe somatic preoccupation and features of borderline personality disorder. It is important to highlight that cognitive dysfunction/impairment can certainly be seen in individuals who are experiencing severe psychiatric distress. Primary areas of difficulty would reasonably surround  processing speed, attention/concentration, executive functioning, and encoding/retrieval aspects of memory. While I am unable to provide an objective quantification of current cognitive abilities due to validity concerns, the severity of ongoing psychiatric distress would appear the most likely culprit for subjective cognitive dysfunction. Difficulties would be exacerbated by frequent headaches, sleep dysfunction, and mild to moderate microvascular ischemic disease as seen across her most recent brain MRI. Based upon her age alone, symptomatic Alzheimer's disease would be highly unlikely. It is further worth pointing out that retention rates across memory tasks ranged from 80% to 133%, which does not suggest evidence for rapid forgetting, making the presence of this illness extremely unlikely.  [name] was unaccompanied during the current feedback session. Content of the current session focused on the results of her neuropsychological evaluation. [name] was given the opportunity to ask questions and her questions were answered. She was encouraged to reach out should additional questions arise. A copy of her report was provided at the conclusion of the visit.      One unit [phone] was billed for Dr. Tammy Sours time spent preparing for, conducting, and documenting the current feedback session with [name].

## 2023-05-14 ENCOUNTER — Other Ambulatory Visit: Payer: Self-pay

## 2023-05-14 ENCOUNTER — Other Ambulatory Visit (HOSPITAL_COMMUNITY): Payer: Self-pay

## 2023-05-15 NOTE — Progress Notes (Unsigned)
05/16/2023 Janice [name] [id number] [birth year]  Referring provider: Marcine Matar, MD Primary GI doctor: Dr. Adela Lank  ASSESSMENT AND PLAN:   Gastroesophageal Reflux Disease (GERD) Longstanding history of reflux symptoms with occasional choking on liquids. No dysphagia or melena. -Start Pantoprazole for 3 months. -Provide patient with dietary and lifestyle modification information. -Schedule EGD with colonoscopy to evaluate for potential complications such as Barrett's esophagus. - consider MBS if symptoms continue  Constipation Change in bowel habits to every 2-3 days since starting Qulipta for migraines. No blood in stool. -Start using Polyethylene Glycol (generic MiraLAX). -Encourage increased water intake, fiber, and physical activity.  Colon Cancer Screening No prior colon cancer screening and no family history of colon cancer. -Schedule screening colonoscopy.  Liver Function Elevated alkaline phosphatase on previous labs. -Order abdominal ultrasound to evaluate liver and gallbladder. -Declines labs today consider GGT  Chronic Kidney Disease (CKD) Stage 3 CKD, no current symptoms. -No changes to current management plan.  Morbid obesity  Body mass index is 43.16 kg/m.  -Patient has been advised to make an attempt to improve diet and exercise patterns to aid in weight loss. -Recommended diet heavy in fruits and veggies and low in animal meats, cheeses, and dairy products, appropriate calorie intake   Patient Care Team: Marcine Matar, MD as PCP - General (Internal Medicine) Drema Dallas, DO as Consulting Physician (Neurology) Janalyn Harder, MD (Inactive) as Consulting Physician (Dermatology) Loni Muse, MD as Attending Physician (Hematology)  HISTORY OF PRESENT ILLNESS: 57 y.o. female with a past medical history of hypertension, migraines, hemorrhoids, OSA, GERD, stage IIIb CKD, prediabetes, morbid obesity, anxiety and others listed  below presents for evaluation of constipation.  No abdominal imaging to review 01/26/2023 WBC 11.1, no left shift, MCV slightly decreased at 78.3, hemoglobin normal at 12, normal platelets.  Alk phos 113 previously 144, normal AST, ALT total bilirubin.  A1c 5.7, ferritin 127.  Discussed the use of AI scribe software for clinical note transcription with the patient, who gave verbal consent to proceed.  History of Present Illness   The patient, with a history of stage three chronic kidney disease, presents for a colonoscopy referral from their primary care physician. They deny any gastrointestinal symptoms prompting the referral, attributing it to routine screening. However, they report recent changes in bowel habits, including watery stools due to a suspected stomach virus and straining during bowel movements. The patient notes that these changes occurred after starting Qulipta for migraines, which they believe has led to constipation. They have been using polyethylene glycol to manage this, with some success.  The patient also reports a long-standing history of reflux, experiencing daily burning sensations in their chest and a feeling of being unable to burp. They have been managing this with Tums, which provides temporary relief. They deny any difficulty swallowing or dark stools associated with this.  The patient has a significant smoking history, having smoked for approximately 40 years, but quit eight years ago. They report occasional alcohol use and past marijuana use. They also have a family history of pancreatic cancer, with their father being diagnosed around the age of 35.  The patient also mentions occasional chest pain and shortness of breath, particularly when lying down to sleep. They also report occasional swelling in their feet, but deny any associated pain.   She  reports that she has quit smoking. She has never used smokeless tobacco. She reports current alcohol use. She reports that  she does not use  drugs.  RELEVANT LABS AND IMAGING:  Results   LABS Alkaline Phosphatase: elevated, two times normal  RADIOLOGY Abdominal Ultrasound: Kidney and bladder ultrasound (05/15/2023)      CBC    Component Value Date/Time   WBC 11.1 (H) 01/26/2023 1515   WBC 10.9 (H) 04/14/2021 2235   RBC 5.06 01/26/2023 1515   HGB 12.0 01/26/2023 1515   HGB 14.9 05/01/2022 1559   HCT 39.6 01/26/2023 1515   HCT 47.3 (H) 05/01/2022 1559   PLT 342 01/26/2023 1515   PLT 299 05/01/2022 1559   MCV 78.3 (L) 01/26/2023 1515   MCV 78 (L) 05/01/2022 1559   MCH 23.7 (L) 01/26/2023 1515   MCHC 30.3 01/26/2023 1515   RDW 14.0 01/26/2023 1515   RDW 13.4 05/01/2022 1559   LYMPHSABS 2.2 01/26/2023 1515   LYMPHSABS 2.2 03/06/2019 1344   MONOABS 0.6 01/26/2023 1515   EOSABS 0.1 01/26/2023 1515   EOSABS 0.2 03/06/2019 1344   BASOSABS 0.1 01/26/2023 1515   BASOSABS 0.1 03/06/2019 1344   Recent Labs    05/26/22 1358 06/30/22 0839 07/28/22 1344 10/27/22 1513 01/26/23 1515  HGB 13.2 12.3 12.7 12.2 12.0    CMP     Component Value Date/Time   NA 137 01/26/2023 1515   NA 140 11/14/2022 1644   K 4.0 01/26/2023 1515   CL 102 01/26/2023 1515   CO2 28 01/26/2023 1515   GLUCOSE 104 (H) 01/26/2023 1515   BUN 22 (H) 01/26/2023 1515   BUN 22 11/14/2022 1644   CREATININE 1.82 (H) 01/26/2023 1515   CREATININE 1.16 (H) 08/24/2016 1634   CALCIUM 9.3 01/26/2023 1515   PROT 8.1 01/26/2023 1515   PROT 7.4 11/14/2022 1644   ALBUMIN 3.8 01/26/2023 1515   ALBUMIN 4.0 11/14/2022 1644   AST 10 (L) 01/26/2023 1515   ALT 8 01/26/2023 1515   ALKPHOS 113 01/26/2023 1515   BILITOT 0.6 01/26/2023 1515   GFRNONAA 32 (L) 01/26/2023 1515   GFRNONAA 55 (L) 08/24/2016 1634   GFRAA 73 10/30/2019 1708   GFRAA 63 08/24/2016 1634      Latest Ref Rng & Units 01/26/2023    3:15 PM 11/14/2022    4:44 PM 05/01/2022    3:59 PM  Hepatic Function  Total Protein 6.5 - 8.1 g/dL 8.1  7.4  7.8   Albumin 3.5 - 5.0  g/dL 3.8  4.0  4.1   AST 15 - 41 U/L 10  13  21    ALT 0 - 44 U/L 8  9  9    Alk Phosphatase 38 - 126 U/L 113  144  152   Total Bilirubin 0.3 - 1.2 mg/dL 0.6  0.3  0.7       Current Medications:    Current Outpatient Medications (Cardiovascular):    amLODipine (NORVASC) 10 MG tablet, Take 1 tablet (10 mg total) by mouth once nightly at bedtime.   labetalol (NORMODYNE) 100 MG tablet, Take 1 tablet (100 mg total) by mouth 2 (two) times daily.   spironolactone (ALDACTONE) 25 MG tablet, Take 1 tablet (25 mg total) by mouth daily.   Current Outpatient Medications (Analgesics):    aspirin EC 81 MG tablet, Take 81 mg by mouth every 4 (four) hours as needed for moderate pain (pain score 4-6). Swallow whole.   Atogepant (QULIPTA) 60 MG TABS, Take 1 tablet (60 mg total) by mouth daily.   traMADol (ULTRAM) 50 MG tablet, Take 1 tablet (50 mg total) by mouth every 12 (twelve) hours  as needed.   Ubrogepant (UBRELVY) 100 MG TABS, Take 1 tablet (100 mg total) by mouth as needed. May repeat after 2 hours.  Maximum 2 tablets in 24 hours.   Current Outpatient Medications (Other):    buPROPion (WELLBUTRIN SR) 150 MG 12 hr tablet, Take 1 tablet (150 mg total) by mouth 2 (two) times daily.   conjugated estrogens (PREMARIN) vaginal cream, Place 1 Applicatorful vaginally once a week.   Na Sulfate-K Sulfate-Mg Sulf 17.5-3.13-1.6 GM/177ML SOLN, Take 1 kit by mouth once for 1 dose.   pantoprazole (PROTONIX) 20 MG tablet, Take 1 tablet (20 mg total) by mouth daily.   polyethylene glycol powder (GLYCOLAX/MIRALAX) 17 GM/SCOOP powder, Take 17 g by mouth daily as needed (for constipation).   sertraline (ZOLOFT) 50 MG tablet, Take 1 tablet (50 mg total) by mouth daily.  Medical History:  Past Medical History:  Diagnosis Date   Chest pain 10/04/2007   Esophageal reflux 08/02/2006   Essential hypertension, benign 08/02/2006   Generalized anxiety disorder    Hypertensive urgency 07/10/2022   Leukocytosis  05/26/2022   Major depressive disorder 05/31/2021   Migraine with aura and without status migrainosus, not intractable 05/31/2021   Morbid obesity with body mass index (BMI) of 40.0 to 44.9 in adult 12/13/2021   OSA (obstructive sleep apnea) 06/01/2022   NPSG 07/04/22- AHI 63.4/hr, desaturation to 66%/mean 89.7%, body      Pap smear abnormality of cervix/human papillomavirus (HPV) positive 11/06/2019   Perimenopausal 03/06/2019   Polycythemia, secondary 05/26/2022   Prediabetes 04/04/2019   Primary osteoarthritis of right knee 12/13/2021   Psoriasis 03/06/2019   Stage 3b chronic kidney disease 05/03/2022   Trichomonas infection 11/06/2019   Vitamin D deficiency 09/29/2015   Allergies:  Allergies  Allergen Reactions   Losartan     Chest pain   Neomycin Sulfate Swelling, Rash and Other (See Comments)    Pain in ear, swelling and redness     Surgical History:  She  has a past surgical history that includes Cesarean section. Family History:  Her family history includes Alcohol abuse in her father; Alzheimer's disease in her maternal grandfather; Dementia in her maternal grandmother; Depression in her father; Drug abuse in her father; Hypertension in her daughter, father, maternal grandmother, and mother; Kidney disease in her mother; Stroke in her maternal grandmother.  REVIEW OF SYSTEMS  : All other systems reviewed and negative except where noted in the History of Present Illness.  PHYSICAL EXAM: BP (!) 142/100   Pulse 76   Ht 5' (1.524 m)   Wt 221 lb (100.2 kg)   LMP 07/20/2016   BMI 43.16 kg/m  General Appearance: Well nourished, in no apparent distress. Head:   Normocephalic and atraumatic. Eyes:  sclerae anicteric,conjunctive pink  Respiratory: Respiratory effort normal, BS equal bilaterally without rales, rhonchi, wheezing. Cardio: RRR with no MRGs. Peripheral pulses intact.  Abdomen: Soft,  Obese ,active bowel sounds. mild tenderness in the epigastrium. Without  guarding and Without rebound. No masses. Rectal: Not evaluated Musculoskeletal: Full ROM, Normal gait. Without edema. Skin:  Dry and intact without significant lesions or rashes Neuro: Alert and  oriented x4;  No focal deficits. Psych:  Cooperative. Normal mood and affect.    Doree Albee, PA-C 11:17 AM

## 2023-05-16 ENCOUNTER — Other Ambulatory Visit: Payer: Self-pay

## 2023-05-16 ENCOUNTER — Ambulatory Visit (INDEPENDENT_AMBULATORY_CARE_PROVIDER_SITE_OTHER): Payer: Commercial Managed Care - HMO | Admitting: Physician Assistant

## 2023-05-16 ENCOUNTER — Telehealth: Payer: Self-pay | Admitting: Internal Medicine

## 2023-05-16 ENCOUNTER — Encounter: Payer: Self-pay | Admitting: Physician Assistant

## 2023-05-16 VITALS — BP 142/100 | HR 76 | Ht 60.0 in | Wt 221.0 lb

## 2023-05-16 DIAGNOSIS — K5900 Constipation, unspecified: Secondary | ICD-10-CM

## 2023-05-16 DIAGNOSIS — K219 Gastro-esophageal reflux disease without esophagitis: Secondary | ICD-10-CM | POA: Diagnosis not present

## 2023-05-16 DIAGNOSIS — K5904 Chronic idiopathic constipation: Secondary | ICD-10-CM

## 2023-05-16 DIAGNOSIS — R7989 Other specified abnormal findings of blood chemistry: Secondary | ICD-10-CM

## 2023-05-16 DIAGNOSIS — R748 Abnormal levels of other serum enzymes: Secondary | ICD-10-CM

## 2023-05-16 DIAGNOSIS — Z6841 Body Mass Index (BMI) 40.0 and over, adult: Secondary | ICD-10-CM

## 2023-05-16 DIAGNOSIS — N183 Chronic kidney disease, stage 3 unspecified: Secondary | ICD-10-CM

## 2023-05-16 MED ORDER — NA SULFATE-K SULFATE-MG SULF 17.5-3.13-1.6 GM/177ML PO SOLN
1.0000 | Freq: Once | ORAL | 0 refills | Status: AC
  Filled 2023-05-16: qty 354, 2d supply, fill #0

## 2023-05-16 MED ORDER — PANTOPRAZOLE SODIUM 20 MG PO TBEC
20.0000 mg | DELAYED_RELEASE_TABLET | Freq: Every day | ORAL | 0 refills | Status: DC
  Filled 2023-05-16: qty 30, 30d supply, fill #0
  Filled 2023-06-13: qty 30, 30d supply, fill #1

## 2023-05-16 NOTE — Telephone Encounter (Signed)
Phone call placed to patient today to go over the results of the neuropsych evaluation that was done by the psychologist Dr. Milbert Coulter last month.  Overall assessment was that severe depression and anxiety may be impacting/causing memory issues.  He recommended that she get blood in with behavioral health services for management.   I inquired from patient whether she is plugged in with behavioral health.  Patient states she was seeing a therapist but was told that she may not be able to see her anymore because they do not accept her Medicaid.  She went to [facility] behavioral health yesterday and did some paperwork to try to get in there.  States that she has an appointment coming up in February.  She wonders about getting a new therapist.  I told her I will have my medical assistant send her a list of behavioral health providers in the area.

## 2023-05-16 NOTE — Patient Instructions (Addendum)
  We have sent the following medications to your pharmacy for you to pick up at your convenience: Suprep Protonix  You have been scheduled for an abdominal ultrasound at [facility] (1st floor of hospital) on 05-21-2023 at 7am. Please arrive 30 minutes prior to your appointment for registration. Make certain not to have anything to eat or drink midnight prior to your appointment. Should you need to reschedule your appointment, please contact radiology at [phone]. This test typically takes about 30 minutes to perform.  You have been scheduled for an endoscopy and colonoscopy. Please follow the written instructions given to you at your visit today.  Please pick up your prep supplies at the pharmacy within the next 1-3 days.  If you use inhalers (even only as needed), please bring them with you on the day of your procedure.  DO NOT TAKE 7 DAYS PRIOR TO TEST- Trulicity (dulaglutide) Ozempic, Wegovy (semaglutide) Mounjaro (tirzepatide) Bydureon Bcise (exanatide extended release)  DO NOT TAKE 1 DAY PRIOR TO YOUR TEST Rybelsus (semaglutide) Adlyxin (lixisenatide) Victoza (liraglutide) Byetta (exanatide) ___________________________________________________________________________   Eather Colas is an osmotic laxative.  It only brings more water into the stool.  This is safe to take daily.  Can take up to 17 gram of miralax twice a day.  Mix with juice or coffee.  Start 1 capful at night for 3-4 days and reassess your response in 3-4 days.  You can increase and decrease the dose based on your response.  Remember, it can take up to 3-4 days to take effect OR for the effects to wear off.   I often pair this with benefiber in the morning to help assure the stool is not too loose.   Please take your proton pump inhibitor medication, prilosec 20 mg  Please take this medication 30 minutes to 1 hour before meals- this makes it more effective.  Avoid spicy and acidic foods Avoid fatty  foods Limit your intake of coffee, tea, alcohol, and carbonated drinks Work to maintain a healthy weight Keep the head of the bed elevated at least 3 inches with blocks or a wedge pillow if you are having any nighttime symptoms Stay upright for 2 hours after eating Avoid meals and snacks three to four hours before bedtime CONGRATS smoking  Behavioral and Dietary Strategies for Management of Esophageal Dysmotility/dysphagia 1. Take reflux medications 30+ minutes before food in the morning.  2. Begin meals with warm beverage 3. Eat smaller more frequent meals 4. Eat slowly, taking small bites and sips 5. Alternate solids and liquids 6. Avoid foods/liquids that increase acid production 7. Sit upright during and for 30+ minutes after meals to facilitate esophageal clearing 8. Can try altoid melting in mouth before food

## 2023-05-16 NOTE — Progress Notes (Signed)
Agree with assessment / plan as outlined.  

## 2023-05-17 ENCOUNTER — Other Ambulatory Visit: Payer: Self-pay

## 2023-05-17 ENCOUNTER — Telehealth: Payer: Self-pay | Admitting: Internal Medicine

## 2023-05-21 ENCOUNTER — Ambulatory Visit (HOSPITAL_COMMUNITY): Payer: Commercial Managed Care - HMO

## 2023-05-21 NOTE — Telephone Encounter (Signed)
Hope this helps:  Counseling Resources   [email or web address]  [facility] [address] [place], [place] [address] [phone] or [phone] Walk-in urgent care 24/7 for anyone  For [facility] ONLY New patient assessments and therapy walk-ins: Monday and Wednesday 8am-11am First and second Friday 1pm-5pm New patient psychiatry and medication management walk-ins: Mondays, Wednesdays, Thursdays, Fridays 8am-11am No psychiatry walk-ins on Tuesday   *Accepts all insurance and uninsured for Urgent Care needs *Accepts Medicaid and uninsured for outpatient treatment   [facility] (Therapy and psychiatry) [facility] (near [employer]) [address] [place], [place] [address] [phone] Fax: [phone] (INSURANCE REQUIRED-MEDICAID ACCEPTED)   [facility] Address: Four Locations  -[address] [place], [place], [place]                                 Phone: [phone] -[address], [place], [place]         Phone: [phone] -[address], [place], [place]                      Phone: [phone] -[address], [place], [place]         Phone: [phone] Age Range: Children, Adolescents, and Adults Specialty Areas: Depression, Anxiety, ADHD, Substance Abuse, Bipolar Disorder, etc...  [facility] Address: [address] [place], [place] Phone: [phone] Age Range: Children, Adults, and Elders Specialty Areas: Couple, Family, Group, Individual    [facility] Address: [address] [place], [place] Phone: [phone] Age Range: Children, Adolescents, and Adults Specialty Areas: Individual, Family and Couples Therapy, and Substance Abuse  [facility] Address: [address] [place], [place] [address] Phone:  [phone] Age Range: Adolescents/Teenagers and Adults Specialty Areas: Individual, Family, Couples, and Group Counseling   [facility] Address: [address] [place], [place] Phone: [phone] Age Range: Children and Adults Specialty Areas: Individual, Group, and Family counseling to people who have experienced domestic violence or sexual assault  [facility] Address: [address] [place], [place] [address] Phone: [phone] Age Range: Adults & Children (Ages 3+) Specialty Areas: Mental Health and Substance Abuse Counseling Services  [facility] Address: [address] [place], [place], [place] [address] Phone: [phone]             Age Range: All Ages  Specialty Areas: Common mental health diagnoses such as Anxiety, Depression, ADD/ADHD, Bipolar, and PTSD, Substance Abuse Evaluations and Counseling   [facility] at [facility] [address] [place] [address] [place],  [place]  [address] [phone] Call for appointment  [facility] (Therapy only)  [facility] [address] [place], [place] [address] Monday - Friday: 8:30 a.m.-12 p.m. / 1 p.m.-2:30 p.m.  [facility] [address], [place], [place] [address] Monday-Friday: 8:30 a.m.-12 p.m. / 2-3:30 p.m. (INSURANCE REQUIRED -MEDICAID ACCEPTED) They do offer a sliding fee scale $20-$30/session   [facility] [address] [place], [place] [address] Phone: [phone]  [facility] [address] [place] [address]  Phone: [phone] (Does not accept Medicaid) (only one provider accepts Medicare)  [facility] [address] (at [employer])??[address]? [place], [place] [address] (Accepts Medicaid and Medicare)  [facility] [address] [place] [address], [place] [address]  Phone: [phone]  [address] [place], [place] [address] Phone:  [phone] [facility] Medicaid) [facility] (Therapy only)  [address] [place], [place] [address] Phone: [phone]   [facility] (Therapy only)  [address] [place], [place] [address] Phone: [phone] [facility] & Medicare)   [facility] [address] [place], [place] [address] Phone: [phone] (Accepts [place] Health Choice) [facility] [phone] [address] [place], [place] [address] Phone: [phone] [facility]) [facility] (Psychiatry only)  [phone] [address], [place], [place] [address] (Accepts Medicaid and Medicare) [facility] (Psychiatry and therapy)  [address] [place], [place] [address] [phone] [facility] Medicare) [facility] (psychiatry and therapy) [address], [place], [place] [address] [phone]  [facility] at [address], [place], [place] [address] [phone] Accept [address] [place], [place], [place], [place],  and the following types of Medicaid; Alliance, [place], Partners, [place], [place] Health Choice, [employer], Healthy [place], [place], and [place], as well as offering a Careers adviser and private payment options. Provides In-Office Appointments, Virtual Appointments, and Phone Consultations Offers medication management for ages 62 years old and up, including,  Medication Management for Suboxone and [facility] [phone] [address], [place], [place] [address] (Accepts Medicaid and Medicare)         19.  [facility] (therapy only)  [address] [place], [place] [address]            [phone] (Accepts medicare) 20. [facility]  (Suboxone and methodone) [phone] [address] [place], [place] [address] To Be Eligible for Opioid Treatment at [facility] you must be  at least 58 years of age you have already tried other interventions that were not successful such as opioid detox, inpatient rehab for opioids, or outpatient counseling specifically for opioid dependency your [facility] drug test must be completely free of benzodiazepines (klonopin, xanax, valium, ativan, or other benz) you have reliable transportation to the [facility] clinic in [place] you recognize that counseling is a critical component of [facility]' Opioid Program and you agree to attend all required counseling sessions you are committed to total drug abstinence and will conscientiously strive to remain free of alcohol, marijuana, and other illicit substances while in treatment you desire a peaceful treatment atmosphere in which personal responsibility and respect toward staff and clients is the norm   21. [facility] [address] [place], [place], [place] [address] Offers SAIOP (Substance Abuse Intensive Outpatient Program) [phone] 22. [facility] [address] [place], [place] [address] [phone] (Accepts medicare)  For those who are tech savvy, go on psychology today, type in your local city (i.e. [place]. [place]) and specify your insurance at the top of the screen after you search. (Medicaid if needed). You can also specify whether you are interested in therapy and psychiatry.  www.psychologytoday.com/us     [facility] Clinics in [place], [place]  [facility] ([facility]) [address], [place] [address] [phone] Mon-Fri 6:30-10am  [facility])  [address], [place] [address] New patients- [phone]  -Accepts Medicaid and other commercial insurances.  -Payment plans start at $15 a day.  -New Patients should  call number to schedule an appointment  [facility] [address] [place], [place] [address] [phone] & [phone] -Accepts Medicaid and  other commercial insurances. -New patients should call number to complete new patient assessment   [facility] [address] [place], [address] [place] Phone:[phone] -Accepts Medicaid and other commercial insurances.

## 2023-05-21 NOTE — Telephone Encounter (Signed)
List of behavioral health providers sent via my .  Call placed to patient unable to reach message left on VM.

## 2023-05-25 ENCOUNTER — Ambulatory Visit (HOSPITAL_COMMUNITY): Payer: Commercial Managed Care - HMO

## 2023-05-25 ENCOUNTER — Ambulatory Visit (HOSPITAL_COMMUNITY)
Admission: RE | Admit: 2023-05-25 | Discharge: 2023-05-25 | Disposition: A | Discharge status: Home / Self Care | Payer: Commercial Managed Care - HMO | Source: Ambulatory Visit | Attending: Physician Assistant | Admitting: Physician Assistant

## 2023-05-25 DIAGNOSIS — R7989 Other specified abnormal findings of blood chemistry: Secondary | ICD-10-CM | POA: Diagnosis present

## 2023-05-25 DIAGNOSIS — K219 Gastro-esophageal reflux disease without esophagitis: Secondary | ICD-10-CM | POA: Insufficient documentation

## 2023-06-04 ENCOUNTER — Other Ambulatory Visit: Payer: Self-pay

## 2023-06-05 ENCOUNTER — Other Ambulatory Visit: Payer: Self-pay

## 2023-06-08 ENCOUNTER — Other Ambulatory Visit: Payer: Self-pay

## 2023-06-13 ENCOUNTER — Ambulatory Visit: Payer: Commercial Managed Care - HMO | Admitting: Psychology

## 2023-06-22 ENCOUNTER — Other Ambulatory Visit: Payer: Self-pay

## 2023-06-22 ENCOUNTER — Ambulatory Visit: Payer: Commercial Managed Care - HMO | Attending: Internal Medicine | Admitting: Internal Medicine

## 2023-06-22 VITALS — BP 126/96 | HR 74 | Temp 98.0°F | Ht 60.0 in | Wt 222.0 lb

## 2023-06-22 DIAGNOSIS — N1832 Chronic kidney disease, stage 3b: Secondary | ICD-10-CM

## 2023-06-22 DIAGNOSIS — R877 Abnormal histological findings in specimens from female genital organs: Secondary | ICD-10-CM | POA: Diagnosis not present

## 2023-06-22 DIAGNOSIS — F321 Major depressive disorder, single episode, moderate: Secondary | ICD-10-CM | POA: Diagnosis not present

## 2023-06-22 DIAGNOSIS — R413 Other amnesia: Secondary | ICD-10-CM

## 2023-06-22 DIAGNOSIS — I10 Essential (primary) hypertension: Secondary | ICD-10-CM | POA: Diagnosis not present

## 2023-06-22 DIAGNOSIS — E66813 Obesity, class 3: Secondary | ICD-10-CM

## 2023-06-22 DIAGNOSIS — Z6841 Body Mass Index (BMI) 40.0 and over, adult: Secondary | ICD-10-CM

## 2023-06-22 DIAGNOSIS — K5903 Drug induced constipation: Secondary | ICD-10-CM

## 2023-06-22 DIAGNOSIS — F411 Generalized anxiety disorder: Secondary | ICD-10-CM

## 2023-06-22 DIAGNOSIS — G4733 Obstructive sleep apnea (adult) (pediatric): Secondary | ICD-10-CM

## 2023-06-22 DIAGNOSIS — L309 Dermatitis, unspecified: Secondary | ICD-10-CM

## 2023-06-22 DIAGNOSIS — E669 Obesity, unspecified: Secondary | ICD-10-CM

## 2023-06-22 MED ORDER — PANTOPRAZOLE SODIUM 20 MG PO TBEC
20.0000 mg | DELAYED_RELEASE_TABLET | Freq: Every day | ORAL | 0 refills | Status: DC
  Filled 2023-07-18 (×2): qty 90, 90d supply, fill #0

## 2023-06-22 MED ORDER — LABETALOL HCL 100 MG PO TABS
100.0000 mg | ORAL_TABLET | Freq: Two times a day (BID) | ORAL | 1 refills | Status: DC
  Filled 2023-06-22: qty 60, 30d supply, fill #0

## 2023-06-22 MED ORDER — AMLODIPINE BESYLATE 10 MG PO TABS
10.0000 mg | ORAL_TABLET | Freq: Every day | ORAL | 1 refills | Status: DC
  Filled 2023-06-22: qty 30, 30d supply, fill #0
  Filled 2023-09-04 – 2023-12-17 (×2): qty 30, 30d supply, fill #1

## 2023-06-22 MED ORDER — SPIRONOLACTONE 25 MG PO TABS
25.0000 mg | ORAL_TABLET | Freq: Every day | ORAL | 1 refills | Status: AC
  Filled 2023-06-22: qty 30, 30d supply, fill #0
  Filled 2023-07-18: qty 30, 30d supply, fill #1
  Filled 2023-09-04 – 2023-12-17 (×2): qty 30, 30d supply, fill #2

## 2023-06-22 MED ORDER — SERTRALINE HCL 100 MG PO TABS
100.0000 mg | ORAL_TABLET | Freq: Every day | ORAL | 6 refills | Status: DC
  Filled 2023-06-22: qty 30, 30d supply, fill #0
  Filled 2023-07-18: qty 30, 30d supply, fill #1
  Filled 2023-09-04: qty 30, 30d supply, fill #2

## 2023-06-22 MED ORDER — POLYETHYLENE GLYCOL 3350 17 GM/SCOOP PO POWD
17.0000 g | Freq: Every day | ORAL | 1 refills | Status: AC | PRN
  Filled 2023-06-22: qty 510, 30d supply, fill #0
  Filled 2023-10-02 – 2023-10-25 (×2): qty 510, 30d supply, fill #1
  Filled 2023-12-17 (×2): qty 510, 30d supply, fill #2
  Filled 2024-05-05: qty 510, 30d supply, fill #3

## 2023-06-22 MED ORDER — BUPROPION HCL ER (SR) 150 MG PO TB12
150.0000 mg | ORAL_TABLET | Freq: Two times a day (BID) | ORAL | 1 refills | Status: DC
  Filled 2023-06-22: qty 60, 30d supply, fill #0
  Filled 2023-09-04: qty 60, 30d supply, fill #1

## 2023-06-22 NOTE — Progress Notes (Unsigned)
Patient ID: Janice [name], female    DOB: 09-07-1965  MRN: [id number]  CC: Hypertension (HTN f/u. Med refills /Discuss premarin vaginal cream/No to pneumonia vax)   Subjective: Janice [name] is a 58 y.o. female who presents for chronic ds management. Her concerns today include:   Pt with hx of HTN,  CKD 3b, former tob dep, HL, Vit D def, preDM/morbid obesity, MDD, +HPV on pap, GERD, chronic hypokalemia, psoriasis right leg, OA knee (Dr. Roda Shutters), migraines (Dr. Everlena Cooper), OSA (06/2022),   Patient Active Problem List   Diagnosis Date Noted   Generalized anxiety disorder    OSA (obstructive sleep apnea) 06/01/2022   Polycythemia, secondary 05/26/2022   Leukocytosis 05/26/2022   Stage 3b chronic kidney disease 05/03/2022   Primary osteoarthritis of right knee 12/13/2021   Morbid obesity with body mass index (BMI) of 40.0 to 44.9 in adult 12/13/2021   Migraine with aura and without status migrainosus, not intractable 05/31/2021   Major depressive disorder 05/31/2021   Prediabetes 04/04/2019   Perimenopausal 03/06/2019   Psoriasis 03/06/2019   Vitamin D deficiency 09/29/2015   Essential hypertension, benign 08/02/2006   Esophageal reflux 08/02/2006     Current Outpatient Medications on File Prior to Visit  Medication Sig Dispense Refill   amLODipine (NORVASC) 10 MG tablet Take 1 tablet (10 mg total) by mouth once nightly at bedtime. 90 tablet 1   Atogepant (QULIPTA) 60 MG TABS Take 1 tablet (60 mg total) by mouth daily. 30 tablet 11   buPROPion (WELLBUTRIN SR) 150 MG 12 hr tablet Take 1 tablet (150 mg total) by mouth 2 (two) times daily. 180 tablet 1   conjugated estrogens (PREMARIN) vaginal cream Place 1 Applicatorful vaginally once a week. 42.5 g 12   labetalol (NORMODYNE) 100 MG tablet Take 1 tablet (100 mg total) by mouth 2 (two) times daily. 180 tablet 1   pantoprazole (PROTONIX) 20 MG tablet Take 1 tablet (20 mg total) by mouth daily. 90 tablet 0   polyethylene glycol powder  (GLYCOLAX/MIRALAX) 17 GM/SCOOP powder Take 17 g by mouth daily as needed (for constipation). 3350 g 1   sertraline (ZOLOFT) 50 MG tablet Take 1 tablet (50 mg total) by mouth daily. 90 tablet 1   spironolactone (ALDACTONE) 25 MG tablet Take 1 tablet (25 mg total) by mouth daily. 30 tablet 6   traMADol (ULTRAM) 50 MG tablet Take 1 tablet (50 mg total) by mouth every 12 (twelve) hours as needed. 30 tablet 2   Ubrogepant (UBRELVY) 100 MG TABS Take 1 tablet (100 mg total) by mouth as needed. May repeat after 2 hours.  Maximum 2 tablets in 24 hours. 10 tablet 11   aspirin EC 81 MG tablet Take 81 mg by mouth every 4 (four) hours as needed for moderate pain (pain score 4-6). Swallow whole. (Patient not taking: Reported on 06/22/2023)     No current facility-administered medications on file prior to visit.    Allergies  Allergen Reactions   Losartan     Chest pain   Neomycin Sulfate Swelling, Rash and Other (See Comments)    Pain in ear, swelling and redness    Social History   Socioeconomic History   Marital status: Single    Spouse name: Not on file   Number of children: 3   Years of education: 10   Highest education level: 10th grade  Occupational History   Occupation: Unemployed  Tobacco Use   Smoking status: Former   Smokeless tobacco: Never  Vaping Use   Vaping status: Never Used  Substance and Sexual Activity   Alcohol use: Yes    Comment: occ   Drug use: No   Sexual activity: Yes    Birth control/protection: Condom  Other Topics Concern   Not on file  Social History Narrative   Right Handed    Social Drivers of Health   Financial Resource Strain: Low Risk  (09/29/2022)   Overall Financial Resource Strain (CARDIA)    Difficulty of Paying Living Expenses: Not very hard  Food Insecurity: Food Insecurity Present (04/04/2022)   Hunger Vital Sign    Worried About Running Out of Food in the Last Year: Sometimes true    Ran Out of Food in the Last Year: Sometimes true   Transportation Needs: No Transportation Needs (09/29/2022)   PRAPARE - Administrator, Civil Service (Medical): No    Lack of Transportation (Non-Medical): No  Physical Activity: Insufficiently Active (09/29/2022)   Exercise Vital Sign    Days of Exercise per Week: 2 days    Minutes of Exercise per Session: 40 min  Stress: No Stress Concern Present (09/29/2022)   [employer] of Occupational Health - Occupational Stress Questionnaire    Feeling of Stress : Only a little  Social Connections: Unknown (09/29/2022)   Social Connection and Isolation Panel [NHANES]    Frequency of Communication with Friends and Family: More than three times a week    Frequency of Social Gatherings with Friends and Family: More than three times a week    Attends Religious Services: 1 to 4 times per year    Active Member of [employer] or Organizations: No    Attends Banker Meetings: Never    Marital Status: Patient declined  Catering manager Violence: Not At Risk (09/29/2022)   Humiliation, Afraid, Rape, and Kick questionnaire    Fear of Current or Ex-Partner: No    Emotionally Abused: No    Physically Abused: No    Sexually Abused: No    Family History  Problem Relation Age of Onset   Hypertension Mother    Kidney disease Mother    Alcohol abuse Father    Depression Father    Drug abuse Father    Hypertension Father    Hypertension Maternal Grandmother    Stroke Maternal Grandmother    Dementia Maternal Grandmother    Alzheimer's disease Maternal Grandfather    Hypertension Daughter     Past Surgical History:  Procedure Laterality Date   CESAREAN SECTION     x2    ROS: Review of Systems Negative except as stated above  PHYSICAL EXAM: BP (!) 126/96 (BP Location: Left Arm, Patient Position: Sitting, Cuff Size: Normal)   Pulse 74   Temp 98 F (36.7 C) (Oral)   Ht 5' (1.524 m)   Wt 222 lb (100.7 kg)   LMP 07/20/2016   SpO2 96%   BMI 43.36 kg/m   Wt Readings  from Last 3 Encounters:  06/22/23 222 lb (100.7 kg)  05/16/23 221 lb (100.2 kg)  05/01/23 221 lb (100.2 kg)    Physical Exam  {female adult master:310786} {female adult master:310785}    02/16/2023    3:00 PM 12/29/2022    3:14 PM 08/29/2022    2:55 PM 05/01/2022    2:54 PM  GAD 7 : Generalized Anxiety Score  Nervous, Anxious, on Edge 2 0 0 2  Control/stop worrying 3 2 2 3   Worry too much - different things  3 2 2 3   Trouble relaxing 2 2 2 2   Restless 2 0 1 0  Easily annoyed or irritable 2 1 2 1   Afraid - awful might happen 2 1 1 1   Total GAD 7 Score 16 8 10 12   Anxiety Difficulty Somewhat difficult          02/16/2023    2:59 PM 12/29/2022    3:14 PM 08/29/2022    2:55 PM  Depression screen PHQ 2/9  Decreased Interest 2 1 2   Down, Depressed, Hopeless 3 1 0  PHQ - 2 Score 5 2 2   Altered sleeping 2 2 3   Tired, decreased energy 3 2 3   Change in appetite 3 2 2   Feeling bad or failure about yourself  3 1 2   Trouble concentrating 3 2 2   Moving slowly or fidgety/restless 1 0 3  Suicidal thoughts 0 0 0  PHQ-9 Score 20 11 17   Difficult doing work/chores Somewhat difficult         Latest Ref Rng & Units 01/26/2023    3:15 PM 11/14/2022    4:44 PM 09/29/2022    3:02 PM  CMP  Glucose 70 - 99 mg/dL 161  90  096   BUN 6 - 20 mg/dL 22  22  18    Creatinine 0.44 - 1.00 mg/dL 0.45  4.09  8.11   Sodium 135 - 145 mmol/L 137  140  139   Potassium 3.5 - 5.1 mmol/L 4.0  4.3  3.9   Chloride 98 - 111 mmol/L 102  102  101   CO2 22 - 32 mmol/L 28  27  23    Calcium 8.9 - 10.3 mg/dL 9.3  9.4  9.0   Total Protein 6.5 - 8.1 g/dL 8.1  7.4    Total Bilirubin 0.3 - 1.2 mg/dL 0.6  0.3    Alkaline Phos 38 - 126 U/L 113  144    AST 15 - 41 U/L 10  13    ALT 0 - 44 U/L 8  9     Lipid Panel     Component Value Date/Time   CHOL 181 05/01/2022 1559   TRIG 141 05/01/2022 1559   HDL 50 05/01/2022 1559   CHOLHDL 3.6 05/01/2022 1559   CHOLHDL 3.7 08/24/2016 1634   VLDL 22 08/24/2016 1634    LDLCALC 106 (H) 05/01/2022 1559    CBC    Component Value Date/Time   WBC 11.1 (H) 01/26/2023 1515   WBC 10.9 (H) 04/14/2021 2235   RBC 5.06 01/26/2023 1515   HGB 12.0 01/26/2023 1515   HGB 14.9 05/01/2022 1559   HCT 39.6 01/26/2023 1515   HCT 47.3 (H) 05/01/2022 1559   PLT 342 01/26/2023 1515   PLT 299 05/01/2022 1559   MCV 78.3 (L) 01/26/2023 1515   MCV 78 (L) 05/01/2022 1559   MCH 23.7 (L) 01/26/2023 1515   MCHC 30.3 01/26/2023 1515   RDW 14.0 01/26/2023 1515   RDW 13.4 05/01/2022 1559   LYMPHSABS 2.2 01/26/2023 1515   LYMPHSABS 2.2 03/06/2019 1344   MONOABS 0.6 01/26/2023 1515   EOSABS 0.1 01/26/2023 1515   EOSABS 0.2 03/06/2019 1344   BASOSABS 0.1 01/26/2023 1515   BASOSABS 0.1 03/06/2019 1344    ASSESSMENT AND PLAN:  Assessment and Plan              1. Essential hypertension ***  2. Moderate major depression (HCC) ***  3. Drug-induced constipation ***    Patient was  given the opportunity to ask questions.  Patient verbalized understanding of the plan and was able to repeat key elements of the plan.   This documentation was completed using Paediatric nurse.  Any transcriptional errors are unintentional.  No orders of the defined types were placed in this encounter.    Requested Prescriptions   Pending Prescriptions Disp Refills   spironolactone (ALDACTONE) 25 MG tablet 90 tablet 1    Sig: Take 1 tablet (25 mg total) by mouth daily.   amLODipine (NORVASC) 10 MG tablet 90 tablet 1    Sig: Take 1 tablet (10 mg total) by mouth once nightly at bedtime.   pantoprazole (PROTONIX) 20 MG tablet 90 tablet 0    Sig: Take 1 tablet (20 mg total) by mouth daily.   sertraline (ZOLOFT) 50 MG tablet 90 tablet 1    Sig: Take 1 tablet (50 mg total) by mouth daily.   buPROPion (WELLBUTRIN SR) 150 MG 12 hr tablet 180 tablet 1    Sig: Take 1 tablet (150 mg total) by mouth 2 (two) times daily.   labetalol (NORMODYNE) 100 MG tablet 180 tablet 1     Sig: Take 1 tablet (100 mg total) by mouth 2 (two) times daily.   polyethylene glycol powder (GLYCOLAX/MIRALAX) 17 GM/SCOOP powder 3350 g 1    Sig: Take 17 g by mouth daily as needed (for constipation).    No follow-ups on file.  Jonah Blue, MD, FACP

## 2023-06-22 NOTE — Progress Notes (Unsigned)
03/20/23- 9 yoF former smoker for sleep evaluation courtesy of Dr Jonah Blue with concern of OSA on CPAP for [employer]. Medical problem list includes Migraine, HTN, GERD, Psoriasis, Osteoarthritis, CKD3b, Depression, Polycythemia secondary, Leukocytosis, Obesity,  NPSG 07/04/22- AHI 63.4/hr, desaturation to 66%/mean 89.7%, body weight 232 lbs CPAP titration to19 on 4/22 CPAP/ [employer] Epworth score- 17 Body weight today-222 lbs Sleep study was done because of history of loud snoring and polycythemia raising concern of nocturnal hypoxemia.Marland Kitchen  CPAP successfully started this year, does reduce her snoring.  She is having some mask discomfort and needs help with refit.  She understands weight loss would help her.  She is satisfied to continue working with CPAP.  No history of ENT surgery.  Not aware of lung disease.  Stopped smoking in April.  Once we have her settled with CPAP use we will recheck overnight oximetry and she should probably have PFT because of her smoking history. We will contact [employer] to establish download capability and set at auto 5-20.                      //will need PFT, ONOX on CPAP next visit//  06/25/23- 57 yoF former smoker followed for OSA on CPAP , polycythemia,  Medical problem list includes Migraine, HTN, GERD, Psoriasis, Osteoarthritis, CKD3b, Depression, Polycythemia secondary, Leukocytosis, Obesity,  CPAP [employer] auto 5-20 Download compliance-70%, AHI 2/hr Body weight today-220 lbs Breathing ok. Remains off tobacco for 9 months. Pending colonoscopy tomorrow. To wear CPAP in Recovery. Discussed evaluation for possible hypoxia now that she is on CPAP. Discussed compliance goals.  Discussed the use of AI scribe software for clinical note transcription with the patient, who gave verbal consent to proceed.  History of Present Illness   The patient, with a history of high blood pressure and sleep apnea, presents for a routine follow-up. She expresses concern about an  upcoming colonoscopy due to her high blood pressure. She is adherent to her blood pressure medications and is hopeful that her blood pressure will stabilize.  Regarding her sleep apnea, she is using a CPAP machine nightly but is interested in trying a mask instead of the current pillows. She has been mostly compliant with the CPAP use, achieving the minimum goal of four hours per night about seventy percent of the time. The machine is set to range between five and twenty centimeters of water pressure, spending most of its time between eight and thirteen centimeters. With this setting, she is only experiencing two breakthrough apneas an hour, which is considered quite good.  The patient quit smoking nine months ago. There was a previous concern about a high red blood cell count, or polycythemia, potentially triggered by low oxygen levels. This is being monitored to see if it improves with CPAP use.     ROS-see HPI   + = positive Constitutional:    weight loss, night sweats, fevers, chills, fatigue, lassitude. HEENT:    headaches, difficulty swallowing, +tooth/dental problems, sore throat,       sneezing, itching, +ear ache, nasal congestion, post nasal drip, snoring CV:    chest pain, orthopnea, PND, +swelling in lower extremities, anasarca, dizziness, palpitations Resp:   +shortness of breath with exertion or at rest.                productive cough,   non-productive cough, coughing up of blood.              change in color of mucus.  wheezing.   Skin:    rash or lesions. GI:   +heartburn, indigestion, abdominal pain, nausea, vomiting, diarrhea,                 change in bowel habits, loss of appetite GU: dysuria, change in color of urine, no urgency or frequency.   flank pain. MS:   +joint pain, stiffness, decreased range of motion, back pain. Neuro-     nothing unusual Psych:  change in mood or affect.  depression or +anxiety.   memory loss.  OBJ- Physical Exam General- Alert, Oriented,  Affect-appropriate, Distress- none acute, +morbid obesity Skin- rash-none, lesions- none, excoriation- none Lymphadenopathy- none Head- atraumatic            Eyes- Gross vision intact, PERRLA, conjunctivae and secretions clear            Ears- Hearing, canals-normal            Nose- Clear, no-Septal dev, mucus, polyps, erosion, perforation             Throat- Mallampati IV , mucosa clear , drainage- none, tonsils- atrophic, +teeth Neck- flexible , trachea midline, no stridor , thyroid nl, carotid no bruit Chest - symmetrical excursion , unlabored           Heart/CV- RRR , no murmur , no gallop  , no rub, nl s1 s2                           - JVD- none , edema- none, stasis changes- none, varices- none           Lung- clear to P&A, wheeze- none, cough- none , dullness-none, rub- none           Chest wall-  Abd-  Br/ Gen/ Rectal- Not done, not indicated Extrem- cyanosis- none, clubbing, none, atrophy- none, strength- nl Neuro- grossly intact to observation  Assessment and Plan    Obstructive Sleep Apnea Patient is using CPAP machine regularly with good control of apneas (AHI <5). Patient expressed interest in trying a mask instead of nasal pillows. -Continue CPAP use. -Consider mask trial. -Order overnight oximetry to assess oxygen levels during CPAP use.  Hypertension Patient reports taking antihypertensive medications regularly. Concerns about high blood pressure readings prior to colonoscopy. -Continue current antihypertensive regimen. -Encourage regular blood pressure monitoring.  Polycythemia Previous concern about high red blood cell count potentially due to low oxygen levels. Patient has quit smoking and is using CPAP regularly. -Monitor red blood cell count. -Consider pulmonary function test to assess lung function.  Smoking Cessation Patient quit smoking 9 months ago. -Encourage continued abstinence from smoking.  Colonoscopy Patient has concerns about upcoming  colonoscopy due to high blood pressure. -Reassure patient about safety of colonoscopy. -Encourage communication with GI doctor regarding concerns.

## 2023-06-23 ENCOUNTER — Encounter: Payer: Self-pay | Admitting: Internal Medicine

## 2023-06-23 DIAGNOSIS — R877 Abnormal histological findings in specimens from female genital organs: Secondary | ICD-10-CM | POA: Insufficient documentation

## 2023-06-23 LAB — BASIC METABOLIC PANEL
BUN/Creatinine Ratio: 14 (ref 9–23)
BUN: 21 mg/dL (ref 6–24)
CO2: 26 mmol/L (ref 20–29)
Calcium: 9.5 mg/dL (ref 8.7–10.2)
Chloride: 100 mmol/L (ref 96–106)
Creatinine, Ser: 1.53 mg/dL — ABNORMAL HIGH (ref 0.57–1.00)
Glucose: 92 mg/dL (ref 70–99)
Potassium: 4.3 mmol/L (ref 3.5–5.2)
Sodium: 141 mmol/L (ref 134–144)
eGFR: 39 mL/min/{1.73_m2} — ABNORMAL LOW (ref >59)

## 2023-06-25 ENCOUNTER — Ambulatory Visit: Payer: Commercial Managed Care - HMO | Admitting: Internal Medicine

## 2023-06-25 ENCOUNTER — Other Ambulatory Visit: Payer: Self-pay

## 2023-06-25 ENCOUNTER — Encounter: Payer: Self-pay | Admitting: Internal Medicine

## 2023-06-25 VITALS — BP 169/82 | HR 81 | Temp 98.2°F | Ht 60.0 in | Wt 220.6 lb

## 2023-06-25 DIAGNOSIS — J4489 Other specified chronic obstructive pulmonary disease: Secondary | ICD-10-CM | POA: Diagnosis not present

## 2023-06-25 DIAGNOSIS — J439 Emphysema, unspecified: Secondary | ICD-10-CM | POA: Diagnosis not present

## 2023-06-25 NOTE — Patient Instructions (Addendum)
We can continue CPAP auto 5-20 You can ask [employer] about trying a different mask style  Order- overnight oximetry on CPAP    dx polycythemia  Order- schedule PFT   polycythemia

## 2023-06-26 ENCOUNTER — Other Ambulatory Visit: Payer: Self-pay

## 2023-06-26 ENCOUNTER — Encounter: Payer: Self-pay | Admitting: Gastroenterology

## 2023-06-26 ENCOUNTER — Ambulatory Visit: Payer: Commercial Managed Care - HMO | Admitting: Gastroenterology

## 2023-06-26 VITALS — BP 159/98 | HR 69 | Temp 96.6°F | Resp 16 | Ht 60.0 in | Wt 221.0 lb

## 2023-06-26 DIAGNOSIS — Z1211 Encounter for screening for malignant neoplasm of colon: Secondary | ICD-10-CM | POA: Diagnosis present

## 2023-06-26 DIAGNOSIS — K449 Diaphragmatic hernia without obstruction or gangrene: Secondary | ICD-10-CM

## 2023-06-26 DIAGNOSIS — K5730 Diverticulosis of large intestine without perforation or abscess without bleeding: Secondary | ICD-10-CM

## 2023-06-26 DIAGNOSIS — D120 Benign neoplasm of cecum: Secondary | ICD-10-CM

## 2023-06-26 DIAGNOSIS — K219 Gastro-esophageal reflux disease without esophagitis: Secondary | ICD-10-CM | POA: Diagnosis not present

## 2023-06-26 DIAGNOSIS — D123 Benign neoplasm of transverse colon: Secondary | ICD-10-CM

## 2023-06-26 DIAGNOSIS — K635 Polyp of colon: Secondary | ICD-10-CM | POA: Diagnosis not present

## 2023-06-26 DIAGNOSIS — K621 Rectal polyp: Secondary | ICD-10-CM

## 2023-06-26 DIAGNOSIS — K2289 Other specified disease of esophagus: Secondary | ICD-10-CM | POA: Diagnosis not present

## 2023-06-26 DIAGNOSIS — D127 Benign neoplasm of rectosigmoid junction: Secondary | ICD-10-CM

## 2023-06-26 DIAGNOSIS — D125 Benign neoplasm of sigmoid colon: Secondary | ICD-10-CM | POA: Diagnosis not present

## 2023-06-26 DIAGNOSIS — K648 Other hemorrhoids: Secondary | ICD-10-CM | POA: Diagnosis not present

## 2023-06-26 DIAGNOSIS — D122 Benign neoplasm of ascending colon: Secondary | ICD-10-CM

## 2023-06-26 DIAGNOSIS — D128 Benign neoplasm of rectum: Secondary | ICD-10-CM

## 2023-06-26 MED ORDER — SODIUM CHLORIDE 0.9 % IV SOLN: 500.0000 mL | INTRAVENOUS | Status: AC

## 2023-06-26 MED ORDER — FAMOTIDINE 20 MG PO TABS
20.0000 mg | ORAL_TABLET | Freq: Every day | ORAL | 1 refills | Status: DC | PRN
  Filled 2023-06-26: qty 30, 30d supply, fill #0

## 2023-06-26 NOTE — Progress Notes (Signed)
Called to room to assist during endoscopic procedure.  Patient ID and intended procedure confirmed with present staff. Received instructions for my participation in the procedure from the performing physician.  

## 2023-06-26 NOTE — Progress Notes (Signed)
Patient states there have been no changes to medical or surgical history since time of pre-visit. 

## 2023-06-26 NOTE — Op Note (Signed)
[facility] Patient Name: Janice [name] Procedure Date: 06/26/2023 2:21 PM MRN: [id number] Endoscopist: Viviann Spare P. Adela Lank , MD, [id number] Age: 58 Referring MD:  Date of Birth: 11/23/1965 Gender: Female Account #: [id number] Procedure:                Upper GI endoscopy Indications:              history of gastro-esophageal reflux disease -                            screening for Barrett's, currently using TUMS PRN Medicines:                Monitored Anesthesia Care Procedure:                Pre-Anesthesia Assessment:                           - Prior to the procedure, a History and Physical                            was performed, and patient medications and                            allergies were reviewed. The patient's tolerance of                            previous anesthesia was also reviewed. The risks                            and benefits of the procedure and the sedation                            options and risks were discussed with the patient.                            All questions were answered, and informed consent                            was obtained. Prior Anticoagulants: The patient has                            taken no anticoagulant or antiplatelet agents. ASA                            Grade Assessment: III - A patient with severe                            systemic disease. After reviewing the risks and                            benefits, the patient was deemed in satisfactory                            condition to undergo the procedure.  After obtaining informed consent, the endoscope was                            passed under direct vision. Throughout the                            procedure, the patient's blood pressure, pulse, and                            oxygen saturations were monitored continuously. The                            GIF [id number] #[id number] was introduced through the                             mouth, and advanced to the second part of duodenum.                            The upper GI endoscopy was accomplished without                            difficulty. The patient tolerated the procedure                            well. Scope In: Scope Out: Findings:                 Esophagogastric landmarks were identified: the                            Z-line was found at 38 cm, the gastroesophageal                            junction was found at 38 cm and the upper extent of                            the gastric folds was found at 39 cm from the                            incisors. Z line was slightly irregular but did not                            meet criteria for Barrett's.                           A 1 cm hiatal hernia was present.                           There was a small gastric inlet patch in the                            proximal esophagus. The exam of the esophagus was  otherwise normal.                           The entire examined stomach was normal.                           The examined duodenum was normal. Complications:            No immediate complications. Estimated blood loss:                            None. Estimated Blood Loss:     Estimated blood loss: none. Impression:               - Esophagogastric landmarks identified.                           - Slight irregular zline - did not meet criteria                            for Barrett's                           - 1 cm hiatal hernia.                           - Normal esophagus otherwise.                           - Normal stomach.                           - Normal examined duodenum. Recommendation:           - Patient has a contact number available for                            emergencies. The signs and symptoms of potential                            delayed complications were discussed with the                            patient. Return to normal activities tomorrow.                             Written discharge instructions were provided to the                            patient.                           - Resume previous diet.                           - Continue present medications.                           - Trial of pepcid 20mg  PRN (she  is not using                            medication daily for this), or PPI if symptoms not                            responsive to pepcid. Viviann Spare P. Adela Lank, MD 06/26/2023 2:37:39 PM This report has been signed electronically.

## 2023-06-26 NOTE — Op Note (Signed)
[facility] Patient Name: Janice [name] Procedure Date: 06/26/2023 2:14 PM MRN: [id number] Endoscopist: Viviann Spare P. Adela Lank , MD, [id number] Age: 58 Referring MD:  Date of Birth: 01/23/1966 Gender: Female Account #: [id number] Procedure:                Colonoscopy Indications:              Screening for colorectal malignant neoplasm, This                            is the patient's first colonoscopy Medicines:                Monitored Anesthesia Care Procedure:                Pre-Anesthesia Assessment:                           - Prior to the procedure, a History and Physical                            was performed, and patient medications and                            allergies were reviewed. The patient's tolerance of                            previous anesthesia was also reviewed. The risks                            and benefits of the procedure and the sedation                            options and risks were discussed with the patient.                            All questions were answered, and informed consent                            was obtained. Prior Anticoagulants: The patient has                            taken no anticoagulant or antiplatelet agents. ASA                            Grade Assessment: III - A patient with severe                            systemic disease. After reviewing the risks and                            benefits, the patient was deemed in satisfactory                            condition to undergo the procedure.  After obtaining informed consent, the colonoscope                            was passed under direct vision. Throughout the                            procedure, the patient's blood pressure, pulse, and                            oxygen saturations were monitored continuously. The                            CF [id number] #[id number] was introduced through the anus                            and advanced  to the the cecum, identified by                            appendiceal orifice and ileocecal valve. The                            colonoscopy was performed without difficulty. The                            patient tolerated the procedure well. The quality                            of the bowel preparation was good. The ileocecal                            valve, appendiceal orifice, and rectum were                            photographed. Scope In: 2:39:46 PM Scope Out: 3:11:06 PM Scope Withdrawal Time: 0 hours 26 minutes 9 seconds  Total Procedure Duration: 0 hours 31 minutes 20 seconds  Findings:                 The perianal and digital rectal examinations were                            normal.                           A 4 mm polyp was found in the ileocecal valve. The                            polyp was sessile. The polyp was removed with a                            cold snare. Resection and retrieval were complete.                           Three sessile polyps were found in the ascending  colon. The polyps were 2 to 6 mm in size. These                            polyps were removed with a cold snare. Resection                            and retrieval were complete.                           Two sessile polyps were found in the hepatic                            flexure. The polyps were 5 to 6 mm in size. These                            polyps were removed with a cold snare. Resection                            and retrieval were complete.                           Three sessile polyps were found in the transverse                            colon. The polyps were 3 to 5 mm in size. These                            polyps were removed with a cold snare. Resection                            and retrieval were complete.                           Three sessile polyps were found in the sigmoid                            colon. The polyps were 3 to 5 mm in  size. These                            polyps were removed with a cold snare. Resection                            and retrieval were complete.                           An 8 to 10 mm polyp was found in the recto-sigmoid                            colon. The polyp was pedunculated. The polyp was                            removed with a hot snare. Resection and retrieval  were complete.                           A 3 to 4 mm polyp was found in the rectum. The                            polyp was sessile. The polyp was removed with a                            cold snare. Resection and retrieval were complete.                           Multiple small-mouthed diverticula were found in                            the entire colon.                           Internal hemorrhoids were found during retroflexion.                           The exam was otherwise without abnormality. Complications:            No immediate complications. Estimated blood loss:                            Minimal. Estimated Blood Loss:     Estimated blood loss was minimal. Impression:               - One 4 mm polyp at the ileocecal valve, removed                            with a cold snare. Resected and retrieved.                           - Three 2 to 6 mm polyps in the ascending colon,                            removed with a cold snare. Resected and retrieved.                           - Two 5 to 6 mm polyps at the hepatic flexure,                            removed with a cold snare. Resected and retrieved.                           - Three 3 to 5 mm polyps in the transverse colon,                            removed with a cold snare. Resected and retrieved.                           - Three 3 to 5  mm polyps in the sigmoid colon,                            removed with a cold snare. Resected and retrieved.                           - One 8 to 10 mm polyp at the recto-sigmoid colon,                             removed with a hot snare. Resected and retrieved.                           - One 3 to 4 mm polyp in the rectum, removed with a                            cold snare. Resected and retrieved.                           - Diverticulosis in the entire examined colon.                           - Internal hemorrhoids.                           - The examination was otherwise normal. Recommendation:           - Patient has a contact number available for                            emergencies. The signs and symptoms of potential                            delayed complications were discussed with the                            patient. Return to normal activities tomorrow.                            Written discharge instructions were provided to the                            patient.                           - Resume previous diet.                           - Continue present medications.                           - Await pathology results. Anticipate repeat                            colonoscopy in one year if > 10 polyps are  pre-cancerous Willaim Rayas. Alton Bouknight, MD 06/26/2023 3:21:05 PM This report has been signed electronically.

## 2023-06-26 NOTE — Progress Notes (Signed)
Pt sedate, gd SR's, VSS, report to RN

## 2023-06-26 NOTE — Patient Instructions (Addendum)
Please read handouts provided. Continue present medications. Await pathology results. Resume previous diet. Trial of pepcid 20 mg as needed, or PPI if symptoms not responsive to pepcid.   YOU HAD AN ENDOSCOPIC PROCEDURE TODAY AT THE [facility]:   Refer to the procedure report that was given to you for any specific questions about what was found during the examination.  If the procedure report does not answer your questions, please call your gastroenterologist to clarify.  If you requested that your care partner not be given the details of your procedure findings, then the procedure report has been included in a sealed envelope for you to review at your convenience later.  YOU SHOULD EXPECT: Some feelings of bloating in the abdomen. Passage of more gas than usual.  Walking can help get rid of the air that was put into your GI tract during the procedure and reduce the bloating. If you had a lower endoscopy (such as a colonoscopy or flexible sigmoidoscopy) you may notice spotting of blood in your stool or on the toilet paper. If you underwent a bowel prep for your procedure, you may not have a normal bowel movement for a few days.  Please Note:  You might notice some irritation and congestion in your nose or some drainage.  This is from the oxygen used during your procedure.  There is no need for concern and it should clear up in a day or so.  SYMPTOMS TO REPORT IMMEDIATELY:  Following lower endoscopy (colonoscopy or flexible sigmoidoscopy):  Excessive amounts of blood in the stool  Significant tenderness or worsening of abdominal pains  Swelling of the abdomen that is new, acute  Fever of 100F or higher  Following upper endoscopy (EGD)  Vomiting of blood or coffee ground material  New chest pain or pain under the shoulder blades  Painful or persistently difficult swallowing  New shortness of breath  Fever of 100F or higher  Black, tarry-looking stools  For urgent or  emergent issues, a gastroenterologist can be reached at any hour by calling [phone]. Do not use MyChart messaging for urgent concerns.    DIET:  We do recommend a small meal at first, but then you may proceed to your regular diet.  Drink plenty of fluids but you should avoid alcoholic beverages for 24 hours.  ACTIVITY:  You should plan to take it easy for the rest of today and you should NOT DRIVE or use heavy machinery until tomorrow (because of the sedation medicines used during the test).    FOLLOW UP: Our staff will call the number listed on your records the next business day following your procedure.  We will call around 7:15- 8:00 am to check on you and address any questions or concerns that you may have regarding the information given to you following your procedure. If we do not reach you, we will leave a message.     If any biopsies were taken you will be contacted by phone or by letter within the next 1-3 weeks.  Please call us at [phone] if you have not heard about the biopsies in 3 weeks.    SIGNATURES/CONFIDENTIALITY: You and/or your care partner have signed paperwork which will be entered into your electronic medical record.  These signatures attest to the fact that that the information above on your After Visit Summary has been reviewed and is understood.  Full responsibility of the confidentiality of this discharge information lies with you and/or your care-partner.YOU HAD  AN ENDOSCOPIC PROCEDURE TODAY AT THE [facility]:   Refer to the procedure report that was given to you for any specific questions about what was found during the examination.  If the procedure report does not answer your questions, please call your gastroenterologist to clarify.  If you requested that your care partner not be given the details of your procedure findings, then the procedure report has been included in a sealed envelope for you to review at your convenience later.  YOU  SHOULD EXPECT: Some feelings of bloating in the abdomen. Passage of more gas than usual.  Walking can help get rid of the air that was put into your GI tract during the procedure and reduce the bloating. If you had a lower endoscopy (such as a colonoscopy or flexible sigmoidoscopy) you may notice spotting of blood in your stool or on the toilet paper. If you underwent a bowel prep for your procedure, you may not have a normal bowel movement for a few days.  Please Note:  You might notice some irritation and congestion in your nose or some drainage.  This is from the oxygen used during your procedure.  There is no need for concern and it should clear up in a day or so.  SYMPTOMS TO REPORT IMMEDIATELY:  Following lower endoscopy (colonoscopy or flexible sigmoidoscopy):  Excessive amounts of blood in the stool  Significant tenderness or worsening of abdominal pains  Swelling of the abdomen that is new, acute  Fever of 100F or higher  Following upper endoscopy (EGD)  Vomiting of blood or coffee ground material  New chest pain or pain under the shoulder blades  Painful or persistently difficult swallowing  New shortness of breath  Fever of 100F or higher  Black, tarry-looking stools  For urgent or emergent issues, a gastroenterologist can be reached at any hour by calling [phone]. Do not use MyChart messaging for urgent concerns.    DIET:  We do recommend a small meal at first, but then you may proceed to your regular diet.  Drink plenty of fluids but you should avoid alcoholic beverages for 24 hours.  ACTIVITY:  You should plan to take it easy for the rest of today and you should NOT DRIVE or use heavy machinery until tomorrow (because of the sedation medicines used during the test).    FOLLOW UP: Our staff will call the number listed on your records the next business day following your procedure.  We will call around 7:15- 8:00 am to check on you and address any questions or  concerns that you may have regarding the information given to you following your procedure. If we do not reach you, we will leave a message.     If any biopsies were taken you will be contacted by phone or by letter within the next 1-3 weeks.  Please call us at [phone] if you have not heard about the biopsies in 3 weeks.    SIGNATURES/CONFIDENTIALITY: You and/or your care partner have signed paperwork which will be entered into your electronic medical record.  These signatures attest to the fact that that the information above on your After Visit Summary has been reviewed and is understood.  Full responsibility of the confidentiality of this discharge information lies with you and/or your care-partner.

## 2023-06-26 NOTE — Progress Notes (Signed)
[facility] History and Physical   Primary Care Physician:  Marcine Matar, MD   Reason for Procedure:   Colon cancer screening, GERD  Plan:    Colonoscopy and EGD     HPI: Janice [name] is a 58 y.o. female  here for EGD and colonoscopy - longstanding GERD, rule out BE. Given 20mg  / day but she has not been taking it. Does endorse longstanding symptoms and mostly using TUMS PRN which does help. No dysphagia. First time colonoscopy. Had had some constipation in recent months while on regimen to treat migraines.    No family history of colon cancer known. Otherwise feels well without any cardiopulmonary symptoms.   I have discussed risks / benefits of anesthesia and endoscopic procedure with Albertina Senegal Overholt and they wish to proceed with the exams as outlined today.    Past Medical History:  Diagnosis Date   Chest pain 10/04/2007   Esophageal reflux 08/02/2006   Essential hypertension, benign 08/02/2006   Generalized anxiety disorder    Hypertensive urgency 07/10/2022   Leukocytosis 05/26/2022   Major depressive disorder 05/31/2021   Migraine with aura and without status migrainosus, not intractable 05/31/2021   Morbid obesity with body mass index (BMI) of 40.0 to 44.9 in adult 12/13/2021   OSA (obstructive sleep apnea) 06/01/2022   NPSG 07/04/22- AHI 63.4/hr, desaturation to 66%/mean 89.7%, body      Pap smear abnormality of cervix/human papillomavirus (HPV) positive 11/06/2019   Perimenopausal 03/06/2019   Polycythemia, secondary 05/26/2022   Prediabetes 04/04/2019   Primary osteoarthritis of right knee 12/13/2021   Psoriasis 03/06/2019   Stage 3b chronic kidney disease 05/03/2022   Trichomonas infection 11/06/2019   Vitamin D deficiency 09/29/2015    Past Surgical History:  Procedure Laterality Date   CESAREAN SECTION     x2    Prior to Admission medications   Medication Sig Start Date End Date Taking? Authorizing Provider  amLODipine (NORVASC)  10 MG tablet Take 1 tablet (10 mg total) by mouth once nightly at bedtime. 06/22/23  Yes Marcine Matar, MD  Atogepant (QULIPTA) 60 MG TABS Take 1 tablet (60 mg total) by mouth daily. 01/15/23  Yes Jaffe, Adam R, DO  buPROPion (WELLBUTRIN SR) 150 MG 12 hr tablet Take 1 tablet (150 mg total) by mouth 2 (two) times daily. 06/22/23  Yes Marcine Matar, MD  labetalol (NORMODYNE) 100 MG tablet Take 1 tablet (100 mg total) by mouth 2 (two) times daily. 06/22/23  Yes Marcine Matar, MD  pantoprazole (PROTONIX) 20 MG tablet Take 1 tablet (20 mg total) by mouth daily. 06/22/23  Yes Marcine Matar, MD  polyethylene glycol powder (GLYCOLAX/MIRALAX) 17 GM/SCOOP powder Take 17 g by mouth daily as needed (for constipation). 06/22/23  Yes Marcine Matar, MD  sertraline (ZOLOFT) 100 MG tablet Take 1 tablet (100 mg total) by mouth daily. 06/22/23  Yes Marcine Matar, MD  spironolactone (ALDACTONE) 25 MG tablet Take 1 tablet (25 mg total) by mouth daily. 06/22/23  Yes Marcine Matar, MD  Ubrogepant (UBRELVY) 100 MG TABS Take 1 tablet (100 mg total) by mouth as needed. May repeat after 2 hours.  Maximum 2 tablets in 24 hours. 01/15/23  Yes Everlena Cooper, Adam R, DO  conjugated estrogens (PREMARIN) vaginal cream Place 1 Applicatorful vaginally once a week. 05/01/23   Warden Fillers, MD  traMADol (ULTRAM) 50 MG tablet Take 1 tablet (50 mg total) by mouth every 12 (twelve) hours as needed. 09/13/22  Cristie Hem, PA-C    Current Outpatient Medications  Medication Sig Dispense Refill   amLODipine (NORVASC) 10 MG tablet Take 1 tablet (10 mg total) by mouth once nightly at bedtime. 90 tablet 1   Atogepant (QULIPTA) 60 MG TABS Take 1 tablet (60 mg total) by mouth daily. 30 tablet 11   buPROPion (WELLBUTRIN SR) 150 MG 12 hr tablet Take 1 tablet (150 mg total) by mouth 2 (two) times daily. 180 tablet 1   labetalol (NORMODYNE) 100 MG tablet Take 1 tablet (100 mg total) by mouth 2 (two) times daily. 180 tablet  1   pantoprazole (PROTONIX) 20 MG tablet Take 1 tablet (20 mg total) by mouth daily. 90 tablet 0   polyethylene glycol powder (GLYCOLAX/MIRALAX) 17 GM/SCOOP powder Take 17 g by mouth daily as needed (for constipation). 3350 g 1   sertraline (ZOLOFT) 100 MG tablet Take 1 tablet (100 mg total) by mouth daily. 30 tablet 6   spironolactone (ALDACTONE) 25 MG tablet Take 1 tablet (25 mg total) by mouth daily. 90 tablet 1   Ubrogepant (UBRELVY) 100 MG TABS Take 1 tablet (100 mg total) by mouth as needed. May repeat after 2 hours.  Maximum 2 tablets in 24 hours. 10 tablet 11   conjugated estrogens (PREMARIN) vaginal cream Place 1 Applicatorful vaginally once a week. 42.5 g 12   traMADol (ULTRAM) 50 MG tablet Take 1 tablet (50 mg total) by mouth every 12 (twelve) hours as needed. 30 tablet 2   Current Facility-Administered Medications  Medication Dose Route Frequency Provider Last Rate Last Admin   0.9 %  sodium chloride infusion  500 mL Intravenous Continuous Susan Bleich, Willaim Rayas, MD        Allergies as of 06/26/2023 - Review Complete 06/26/2023  Allergen Reaction Noted   Losartan  09/07/2016   Neomycin sulfate Swelling, Rash, and Other (See Comments) 09/22/2005    Family History  Problem Relation Age of Onset   Hypertension Mother    Kidney disease Mother    Pancreatic cancer Father    Alcohol abuse Father    Depression Father    Drug abuse Father    Hypertension Father    Hypertension Maternal Grandmother    Stroke Maternal Grandmother    Dementia Maternal Grandmother    Alzheimer's disease Maternal Grandfather    Hypertension Daughter    Colon cancer Neg Hx    Esophageal cancer Neg Hx    Stomach cancer Neg Hx    Rectal cancer Neg Hx     Social History   Socioeconomic History   Marital status: Single    Spouse name: Not on file   Number of children: 3   Years of education: 10   Highest education level: 10th grade  Occupational History   Occupation: Unemployed  Tobacco Use    Smoking status: Former    Types: Cigarettes    Passive exposure: Past   Smokeless tobacco: Never   Tobacco comments:    Pt states she quit smoking 9 months ago. AB, CMA 06-25-2023  Vaping Use   Vaping status: Never Used  Substance and Sexual Activity   Alcohol use: Yes    Comment: occ   Drug use: No   Sexual activity: Yes    Birth control/protection: Condom  Other Topics Concern   Not on file  Social History Narrative   Right Handed    Social Drivers of Health   Financial Resource Strain: Low Risk  (09/29/2022)   Overall Financial Resource  Strain (CARDIA)    Difficulty of Paying Living Expenses: Not very hard  Food Insecurity: Food Insecurity Present (04/04/2022)   Hunger Vital Sign    Worried About Running Out of Food in the Last Year: Sometimes true    Ran Out of Food in the Last Year: Sometimes true  Transportation Needs: No Transportation Needs (09/29/2022)   PRAPARE - Administrator, Civil Service (Medical): No    Lack of Transportation (Non-Medical): No  Physical Activity: Insufficiently Active (09/29/2022)   Exercise Vital Sign    Days of Exercise per Week: 2 days    Minutes of Exercise per Session: 40 min  Stress: No Stress Concern Present (09/29/2022)   [employer] of Occupational Health - Occupational Stress Questionnaire    Feeling of Stress : Only a little  Social Connections: Unknown (09/29/2022)   Social Connection and Isolation Panel [NHANES]    Frequency of Communication with Friends and Family: More than three times a week    Frequency of Social Gatherings with Friends and Family: More than three times a week    Attends Religious Services: 1 to 4 times per year    Active Member of [employer] or Organizations: No    Attends Banker Meetings: Never    Marital Status: Patient declined  Catering manager Violence: Not At Risk (09/29/2022)   Humiliation, Afraid, Rape, and Kick questionnaire    Fear of Current or Ex-Partner: No     Emotionally Abused: No    Physically Abused: No    Sexually Abused: No    Review of Systems: All other review of systems negative except as mentioned in the HPI.  Physical Exam: Vital signs BP 108/75   Pulse 84   Temp (!) 96.6 F (35.9 C) (Skin)   Ht 5' (1.524 m)   Wt 221 lb (100.2 kg)   LMP 07/20/2016   SpO2 98%   BMI 43.16 kg/m   General:   Alert,  Well-developed, pleasant and cooperative in NAD Lungs:  Clear throughout to auscultation.   Heart:  Regular rate and rhythm Abdomen:  Soft, nontender and nondistended.   Neuro/Psych:  Alert and cooperative. Normal mood and affect. A and O x 3  Harlin Rain, MD [facility]

## 2023-06-27 ENCOUNTER — Telehealth: Payer: Self-pay

## 2023-06-27 NOTE — Telephone Encounter (Signed)
  Follow up Call-     06/26/2023    1:06 PM  Call back number  Post procedure Call Back phone  # [phone]  Permission to leave phone message Yes     Patient questions:  Do you have a fever, pain , or abdominal swelling? No. Pain Score  0 *  Have you tolerated food without any problems? Yes.    Have you been able to return to your normal activities? Yes.    Do you have any questions about your discharge instructions: Diet   No. Medications  No. Follow up visit  No.  Do you have questions or concerns about your Care? No.  Actions: * If pain score is 4 or above: No action needed, pain <4.

## 2023-06-28 ENCOUNTER — Other Ambulatory Visit: Payer: Self-pay

## 2023-06-29 ENCOUNTER — Encounter: Payer: Self-pay | Admitting: Gastroenterology

## 2023-06-29 LAB — SURGICAL PATHOLOGY

## 2023-07-03 ENCOUNTER — Telehealth: Payer: Self-pay | Admitting: Genetic Counselor

## 2023-07-03 ENCOUNTER — Other Ambulatory Visit: Payer: Self-pay

## 2023-07-03 DIAGNOSIS — D123 Benign neoplasm of transverse colon: Secondary | ICD-10-CM

## 2023-07-03 DIAGNOSIS — D125 Benign neoplasm of sigmoid colon: Secondary | ICD-10-CM

## 2023-07-03 DIAGNOSIS — D120 Benign neoplasm of cecum: Secondary | ICD-10-CM

## 2023-07-03 DIAGNOSIS — D122 Benign neoplasm of ascending colon: Secondary | ICD-10-CM

## 2023-07-03 DIAGNOSIS — D127 Benign neoplasm of rectosigmoid junction: Secondary | ICD-10-CM

## 2023-07-03 DIAGNOSIS — Z860101 Personal history of adenomatous and serrated colon polyps: Secondary | ICD-10-CM

## 2023-07-03 DIAGNOSIS — D128 Benign neoplasm of rectum: Secondary | ICD-10-CM

## 2023-07-03 NOTE — Telephone Encounter (Signed)
Informed patient to contact us to schedule appointments per a referral received

## 2023-07-04 ENCOUNTER — Telehealth: Payer: Self-pay | Admitting: Genetic Counselor

## 2023-07-04 NOTE — Telephone Encounter (Signed)
Scheduled appointments per 1/28 scheduling message. Patient is aware of the appointments made and will be mailed an appointment reminder.

## 2023-07-17 NOTE — Progress Notes (Unsigned)
NEUROLOGY FOLLOW UP OFFICE NOTE  Janice [name] [id number]  Assessment/Plan:   Migraine without aura, without status migrainosus, not intractable Cognitive dysfunction secondary to psychiatric distress Depression      1  Advised to take Qulipta 60mg  once every day. 2  Ubrelvy 100mg  for acute treatment, regardless of taking Qulipta.  Given cerebrovascular disease, would avoid triptans. 3  Limit use of pain relievers to no more than 2 days out of week to prevent risk of rebound or medication-overuse headache. 4.  Continue CPAP 5  Follow up with me in 6 months.        Subjective:  Janice [name] is a 58 year old female with polycythemia, CKD stage 3b, morbid obesity and HTN who follows up for headache.   UPDATE: Restarted Qulipta in August. Migraines initially improved, about once every other week.  Lasts less than 1 hour with Ubrelvy.   However, they have been daily headaches for the past month.  Not sure why but possibly related to depression.  However, she hasn't been taking the [place] daily.  She remembers somebody telling her that she cannot take [place] on the same day because they are both gepants.  She underwent neuropsychological evaluation on 04/18/2023 which revealed that her cognitive deficits likely due to depression and not consistent with a neurodegenerative disease.  Current NSAIDS/analgesics:  ASA 81mg  daily, meloxicam, tramadol Current triptans:  none Current ergotamine:  none Current anti-emetic:  none Current muscle relaxants:  none Current Antihypertensive medications:  labetalol, amlodipine, spironolactone Current Antidepressant medications:  sertaline, Wellbutrin Current Anticonvulsant medications:  none Current anti-CGRP:  Qulipta 60mg  daily, Ubrelvy 100mg  (samples) Current Vitamins/Herbal/Supplements:  none Current Antihistamines/Decongestants:  none Other therapy:  none Hormone/birth control:  none   HISTORY:  She began  experiencing persistent headache and visual disturbance in October 2022.  She described a 9/10 throbbing frontal/temporal headache with photophobia and some blurred vision, confusion, slurred speech, and off balance.  No nausea, vomiting, double vision, visual obscurations, pulsatile tinnitus, numbness ane weakness.  Would wake her up out of her sleep.  She was seen in the ED on 3 occasions between 04/04/2021 and 04/14/2021.  Systolic blood pressure had been from 159 to 200s.  CT head on 04/04/2021 showed chronic small vessel ischemic changes.  Follow up MRV of head was negative for dural venous thrombosis or stenosis.  They started subsiding in December after she was started on daily topiramate and Fioricet, decreasing to 3-4/10 lasting 15-20 minutes occurring every 3 to 4 days (treats with ASA or ibuprofen daily).  She started taking Fioricet almost daily.  Still reports memory problems - gets lost driving on familiar routes or forgetting to take her medications.  Again, this started in October 2022.  Stopped topiramate which did not improve the memory.  To further assess memory, B12 and TSH were checked in August 2023, which were 632 and 0.96 respectively. Blood pressure has still been uncontrolled.  No prior history of headaches.  Just prior to onset of headaches, the father of her children passed away which caused a great deal of emotional distress.  MRI of brain with and without contrast on 10/13/2021 showed mild-moderate chronic small vessel ischemic changes including gliosis along the splenium of the corpus callosum.       Past NSAIDS/analgesics:  naproxen, Fioricet Past abortive triptans:  none Past abortive ergotamine:  none Past muscle relaxants:  tizanidine Past anti-emetic:  none Past antihypertensive medications:  propranolol, lisinopril, HCTZ, losartan Past antidepressant  medications:  none Past anticonvulsant medications:  topiramate (thought it may have contributed to cognitive  changes) Past anti-CGRP:  none Past vitamins/Herbal/Supplements:  none Past antihistamines/decongestants:  none Other past therapies:  none  PAST MEDICAL HISTORY: Past Medical History:  Diagnosis Date   Chest pain 10/04/2007   Esophageal reflux 08/02/2006   Essential hypertension, benign 08/02/2006   Generalized anxiety disorder    Hypertensive urgency 07/10/2022   Leukocytosis 05/26/2022   Major depressive disorder 05/31/2021   Migraine with aura and without status migrainosus, not intractable 05/31/2021   Morbid obesity with body mass index (BMI) of 40.0 to 44.9 in adult 12/13/2021   OSA (obstructive sleep apnea) 06/01/2022   NPSG 07/04/22- AHI 63.4/hr, desaturation to 66%/mean 89.7%, body      Pap smear abnormality of cervix/human papillomavirus (HPV) positive 11/06/2019   Perimenopausal 03/06/2019   Polycythemia, secondary 05/26/2022   Prediabetes 04/04/2019   Primary osteoarthritis of right knee 12/13/2021   Psoriasis 03/06/2019   Stage 3b chronic kidney disease 05/03/2022   Trichomonas infection 11/06/2019   Vitamin D deficiency 09/29/2015    MEDICATIONS: Current Outpatient Medications on File Prior to Visit  Medication Sig Dispense Refill   amLODipine (NORVASC) 10 MG tablet Take 1 tablet (10 mg total) by mouth once nightly at bedtime. 90 tablet 1   Atogepant (QULIPTA) 60 MG TABS Take 1 tablet (60 mg total) by mouth daily. 30 tablet 11   buPROPion (WELLBUTRIN SR) 150 MG 12 hr tablet Take 1 tablet (150 mg total) by mouth 2 (two) times daily. 180 tablet 1   conjugated estrogens (PREMARIN) vaginal cream Place 1 Applicatorful vaginally once a week. 42.5 g 12   famotidine (PEPCID) 20 MG tablet Take 1 tablet (20 mg total) by mouth daily as needed for heartburn or indigestion. 90 tablet 1   labetalol (NORMODYNE) 100 MG tablet Take 1 tablet (100 mg total) by mouth 2 (two) times daily. 180 tablet 1   pantoprazole (PROTONIX) 20 MG tablet Take 1 tablet (20 mg total) by mouth daily.  90 tablet 0   polyethylene glycol powder (GLYCOLAX/MIRALAX) 17 GM/SCOOP powder Take 17 g by mouth daily as needed (for constipation). 3350 g 1   sertraline (ZOLOFT) 100 MG tablet Take 1 tablet (100 mg total) by mouth daily. 30 tablet 6   spironolactone (ALDACTONE) 25 MG tablet Take 1 tablet (25 mg total) by mouth daily. 90 tablet 1   traMADol (ULTRAM) 50 MG tablet Take 1 tablet (50 mg total) by mouth every 12 (twelve) hours as needed. 30 tablet 2   Ubrogepant (UBRELVY) 100 MG TABS Take 1 tablet (100 mg total) by mouth as needed. May repeat after 2 hours.  Maximum 2 tablets in 24 hours. 10 tablet 11   No current facility-administered medications on file prior to visit.    ALLERGIES: Allergies  Allergen Reactions   Losartan     Chest pain   Neomycin Sulfate Swelling, Rash and Other (See Comments)    Pain in ear, swelling and redness    FAMILY HISTORY: Family History  Problem Relation Age of Onset   Hypertension Mother    Kidney disease Mother    Pancreatic cancer Father    Alcohol abuse Father    Depression Father    Drug abuse Father    Hypertension Father    Hypertension Maternal Grandmother    Stroke Maternal Grandmother    Dementia Maternal Grandmother    Alzheimer's disease Maternal Grandfather    Hypertension Daughter    Colon  cancer Neg Hx    Esophageal cancer Neg Hx    Stomach cancer Neg Hx    Rectal cancer Neg Hx       Objective:  Blood pressure 130/82, pulse 80, height 5' (1.524 m), weight 221 lb (100.2 kg), last menstrual period 07/20/2016, SpO2 97%. General: No acute distress.  Patient appears well-groomed.   Head:  Normocephalic/atraumatic Neck:  Supple.  No paraspinal tenderness.  Full range of motion. Heart:  Regular rate and rhythm. Neuro:  Alert and oriented.  Speech fluent and not dysarthric.  Language intact.  CN II-XII intact.  Bulk and tone normal.  Muscle strength 5/5 throughout.  Deep tendon reflexes 2+ throughout.  Gait normal.  Romberg  negative.    Shon Millet, DO  CC: Jonah Blue, MD

## 2023-07-18 ENCOUNTER — Other Ambulatory Visit: Payer: Self-pay

## 2023-07-18 ENCOUNTER — Ambulatory Visit (INDEPENDENT_AMBULATORY_CARE_PROVIDER_SITE_OTHER): Payer: Medicaid Other | Admitting: Neurology

## 2023-07-18 ENCOUNTER — Encounter: Payer: Self-pay | Admitting: Neurology

## 2023-07-18 VITALS — BP 130/82 | HR 80 | Ht 60.0 in | Wt 221.0 lb

## 2023-07-18 DIAGNOSIS — F332 Major depressive disorder, recurrent severe without psychotic features: Secondary | ICD-10-CM | POA: Diagnosis not present

## 2023-07-18 DIAGNOSIS — G43009 Migraine without aura, not intractable, without status migrainosus: Secondary | ICD-10-CM

## 2023-07-18 DIAGNOSIS — F09 Unspecified mental disorder due to known physiological condition: Secondary | ICD-10-CM

## 2023-07-18 MED ORDER — QULIPTA 60 MG PO TABS: 60.0000 mg | ORAL_TABLET | Freq: Every day | ORAL | 11 refills | Status: DC

## 2023-07-18 MED ORDER — UBRELVY 100 MG PO TABS
1.0000 | ORAL_TABLET | ORAL | 11 refills | Status: DC | PRN
Start: 2023-07-18 — End: 2023-08-22

## 2023-07-18 NOTE — Patient Instructions (Signed)
Take QULIPTA 60mg  ONCE DAILY EVERYDAY Take UBRELVY at earliest onset of migraine.  May repeat after 2 hours.  Maximum 2 tablets in 24 hours. Follow up 6 months.

## 2023-07-19 ENCOUNTER — Other Ambulatory Visit: Payer: Self-pay

## 2023-07-20 ENCOUNTER — Other Ambulatory Visit: Payer: Self-pay

## 2023-07-24 ENCOUNTER — Other Ambulatory Visit: Payer: Self-pay

## 2023-07-25 ENCOUNTER — Other Ambulatory Visit: Payer: Self-pay

## 2023-07-27 ENCOUNTER — Telehealth: Payer: Self-pay | Admitting: Pharmacist

## 2023-07-27 ENCOUNTER — Telehealth: Payer: Self-pay

## 2023-07-27 NOTE — Telephone Encounter (Signed)
Pharmacy Patient Advocate Encounter   Received notification from Patient Pharmacy that prior authorization for Ubrelvy 100MG  tablets is required/requested.   Insurance verification completed.   The patient is insured through [facility] .   Per test claim: PA required; PA submitted to above mentioned insurance via CoverMyMeds Key/confirmation #/EOC [id number] Status is pending

## 2023-07-27 NOTE — Telephone Encounter (Signed)
 PA needed for [facility]

## 2023-07-31 ENCOUNTER — Other Ambulatory Visit (HOSPITAL_COMMUNITY): Payer: Self-pay

## 2023-08-13 ENCOUNTER — Telehealth: Payer: Self-pay | Admitting: Gastroenterology

## 2023-08-13 NOTE — Telephone Encounter (Signed)
 Noted. Appreciate the follow up.  Dr Adela Lank, just [facility], patient has rescheduled genetic counseling appointment to 10/03/23.

## 2023-08-13 NOTE — Telephone Encounter (Signed)
 Inbound call from [facility] wanting to advised patient had an appointment for tomorrow 3/11. Stated when asking patient to confirm patient did not have transportation and has been rescheduled for 4/30. Please advise, thank you.

## 2023-08-13 NOTE — Telephone Encounter (Signed)
 Okay thank you

## 2023-08-14 ENCOUNTER — Other Ambulatory Visit: Payer: Commercial Managed Care - HMO

## 2023-08-14 ENCOUNTER — Encounter: Payer: Commercial Managed Care - HMO | Admitting: Genetic Counselor

## 2023-08-15 DIAGNOSIS — M898X9 Other specified disorders of bone, unspecified site: Secondary | ICD-10-CM | POA: Insufficient documentation

## 2023-08-16 ENCOUNTER — Other Ambulatory Visit: Payer: Self-pay

## 2023-08-16 ENCOUNTER — Encounter (HOSPITAL_BASED_OUTPATIENT_CLINIC_OR_DEPARTMENT_OTHER): Payer: Self-pay | Admitting: Family

## 2023-08-16 ENCOUNTER — Other Ambulatory Visit (HOSPITAL_BASED_OUTPATIENT_CLINIC_OR_DEPARTMENT_OTHER): Payer: Self-pay

## 2023-08-16 ENCOUNTER — Ambulatory Visit (HOSPITAL_BASED_OUTPATIENT_CLINIC_OR_DEPARTMENT_OTHER): Payer: Commercial Managed Care - HMO | Admitting: Family

## 2023-08-16 VITALS — BP 134/89 | HR 72 | Ht 60.0 in | Wt 222.0 lb

## 2023-08-16 DIAGNOSIS — N1832 Chronic kidney disease, stage 3b: Secondary | ICD-10-CM | POA: Diagnosis not present

## 2023-08-16 DIAGNOSIS — G4733 Obstructive sleep apnea (adult) (pediatric): Secondary | ICD-10-CM

## 2023-08-16 DIAGNOSIS — I10 Essential (primary) hypertension: Secondary | ICD-10-CM | POA: Diagnosis not present

## 2023-08-16 MED ORDER — LABETALOL HCL 100 MG PO TABS
150.0000 mg | ORAL_TABLET | Freq: Two times a day (BID) | ORAL | 3 refills | Status: DC
  Filled 2023-08-16: qty 90, 30d supply, fill #0
  Filled 2023-08-17: qty 270, 90d supply, fill #0
  Filled 2023-10-25 – 2023-12-17 (×2): qty 270, 90d supply, fill #1

## 2023-08-16 NOTE — Patient Instructions (Signed)
 Medication Instructions:  Your physician has recommended you make the following change in your medication:   Change: Labetalol 150mg  twice daily   Testing/Procedures: Your physician has requested that you have a renal artery duplex. During this test, an ultrasound is used to evaluate blood flow to the kidneys. Allow one hour for this exam. Do not eat after midnight the day before and avoid carbonated beverages. Take your medications as you usually do.    Follow-Up: 2-3 months in [facility] with Dr. Duke Salvia, Gillian Shields, NP or Phillips Hay

## 2023-08-16 NOTE — Progress Notes (Signed)
 Advanced Hypertension Clinic Initial Assessment:    Date:  08/18/2023   ID:  Janice [name], DOB Mar 29, 1966, MRN [id number]  PCP:  Marcine Matar, MD  Cardiologist:  None  Nephrologist:  Referring MD: Marcine Matar, MD   CC: Hypertension  History of Present Illness:    Janice [name] is a 58 y.o. female with a hx of HTN, CKDIII, vitamin D deficiency, morbid obesity  here to establish care in the Advanced Hypertension Clinic.   Follows with [facility] Dr. Lequita Halt last seen 08/15/22 with BP 124/85 and average creatinine 1.3-1.4 and more recently GFR 40-50 ml/min. Most recent labs 08/15/23 creatinine 1.7, GFR 35, K 3.8.   Janice [name] was diagnosed with hypertension at age 22 years old - she started antihypertensive regimen after pregnancy. Notes her daughter, granddaughter also have high blood. Blood pressure checked with arm cuff at home. Readings have been 120-130/90s. she reports tobacco use previously having quit 06/2023. Alcohol use sparingly socially. For exercise she enjoys  walking and working in her yard. she eats at home and does follow low sodium diet.   When her blood pressure high notes she gets headaches and short of breath. She does note exertional dyspnea which has been stable over time. Feels her blood pressure has been better recently which she attributes to changes to her current regimen Spironolactone, Norvasc and Metoprolol.   Was referred by her PCP to assist with management. There was concern about using ACE/ARB due to hyperkalemia. On my review isolated elevated potassium 05/01/22 of 5.3. Labs 10 days later K 4.1. Question whether there was hemolysis of initial sample causing falsely elevated potassium. No medication changes around that time (had been on Spironolactone since 06/2021). Since that time potassium has been 3.9-4.1. Prior to that time 2016-2022 had persistent hypokalemia.  Occasional chest pain described as "pressure" or "sharp" when  laying down lasting a couple minutes and then goes away on its own. Reassured atypical for angina. More swelling if she is on her feet a lot which improves with elevation.   Previous antihypertensives: Losartan - chest pain  Past Medical History:  Diagnosis Date   Chest pain 10/04/2007   Esophageal reflux 08/02/2006   Essential hypertension, benign 08/02/2006   Generalized anxiety disorder    Hypertensive urgency 07/10/2022   Leukocytosis 05/26/2022   Major depressive disorder 05/31/2021   Migraine with aura and without status migrainosus, not intractable 05/31/2021   Morbid obesity with body mass index (BMI) of 40.0 to 44.9 in adult 12/13/2021   OSA (obstructive sleep apnea) 06/01/2022   NPSG 07/04/22- AHI 63.4/hr, desaturation to 66%/mean 89.7%, body      Pap smear abnormality of cervix/human papillomavirus (HPV) positive 11/06/2019   Perimenopausal 03/06/2019   Polycythemia, secondary 05/26/2022   Prediabetes 04/04/2019   Primary osteoarthritis of right knee 12/13/2021   Psoriasis 03/06/2019   Stage 3b chronic kidney disease 05/03/2022   Trichomonas infection 11/06/2019   Vitamin D deficiency 09/29/2015    Past Surgical History:  Procedure Laterality Date   CESAREAN SECTION     x2    Current Medications: Current Meds  Medication Sig   amLODipine (NORVASC) 10 MG tablet Take 1 tablet (10 mg total) by mouth once nightly at bedtime.   buPROPion (WELLBUTRIN SR) 150 MG 12 hr tablet Take 1 tablet (150 mg total) by mouth 2 (two) times daily.   conjugated estrogens (PREMARIN) vaginal cream Place 1 Applicatorful vaginally once a week.   famotidine (PEPCID) 20  MG tablet Take 1 tablet (20 mg total) by mouth daily as needed for heartburn or indigestion.   polyethylene glycol powder (GLYCOLAX/MIRALAX) 17 GM/SCOOP powder Take 17 g by mouth daily as needed (for constipation).   sertraline (ZOLOFT) 100 MG tablet Take 1 tablet (100 mg total) by mouth daily.   spironolactone (ALDACTONE) 25  MG tablet Take 1 tablet (25 mg total) by mouth daily.   traMADol (ULTRAM) 50 MG tablet Take 1 tablet (50 mg total) by mouth every 12 (twelve) hours as needed.   [DISCONTINUED] labetalol (NORMODYNE) 100 MG tablet Take 1 tablet (100 mg total) by mouth 2 (two) times daily.     Allergies:   Losartan and Neomycin sulfate   Social History   Socioeconomic History   Marital status: Single    Spouse name: Not on file   Number of children: 3   Years of education: 10   Highest education level: 10th grade  Occupational History   Occupation: Unemployed  Tobacco Use   Smoking status: Former    Types: Cigarettes    Passive exposure: Past   Smokeless tobacco: Never   Tobacco comments:    Pt states she quit smoking 9 months ago. AB, CMA 06-25-2023  Vaping Use   Vaping status: Never Used  Substance and Sexual Activity   Alcohol use: Not Currently    Comment: occ   Drug use: No   Sexual activity: Yes    Partners: Male    Birth control/protection: Condom    Comment: single  Other Topics Concern   Not on file  Social History Narrative   Right Handed    Social Drivers of Health   Financial Resource Strain: Low Risk  (09/29/2022)   Overall Financial Resource Strain (CARDIA)    Difficulty of Paying Living Expenses: Not very hard  Food Insecurity: Food Insecurity Present (04/04/2022)   Hunger Vital Sign    Worried About Running Out of Food in the Last Year: Sometimes true    Ran Out of Food in the Last Year: Sometimes true  Transportation Needs: No Transportation Needs (09/29/2022)   PRAPARE - Administrator, Civil Service (Medical): No    Lack of Transportation (Non-Medical): No  Physical Activity: Insufficiently Active (09/29/2022)   Exercise Vital Sign    Days of Exercise per Week: 2 days    Minutes of Exercise per Session: 40 min  Stress: No Stress Concern Present (09/29/2022)   [employer] of Occupational Health - Occupational Stress Questionnaire    Feeling of  Stress : Only a little  Social Connections: Unknown (09/29/2022)   Social Connection and Isolation Panel [NHANES]    Frequency of Communication with Friends and Family: More than three times a week    Frequency of Social Gatherings with Friends and Family: More than three times a week    Attends Religious Services: 1 to 4 times per year    Active Member of [employer] or Organizations: No    Attends Engineer, structural: Never    Marital Status: Patient declined     Family History: The patient's family history includes Alcohol abuse in her father; Alzheimer's disease in her maternal grandfather; Dementia in her maternal grandmother; Depression in her father; Drug abuse in her father; Hypertension in her daughter, father, maternal grandmother, and mother; Kidney disease in her mother; Pancreatic cancer in her father; Stroke in her maternal grandmother. There is no history of Colon cancer, Esophageal cancer, Stomach cancer, or Rectal cancer.  ROS:  Please see the history of present illness.     All other systems reviewed and are negative.  EKGs/Labs/Other Studies Reviewed:    EKG Interpretation Date/Time:  Thursday August 16 2023 14:24:39 EDT Ventricular Rate:  72 PR Interval:  188 QRS Duration:  84 QT Interval:  424 QTC Calculation: 464 R Axis:   -1  Text Interpretation: Normal sinus rhythm Poor R wave progression No acute changes Confirmed by Gillian Shields ([id number]) on 08/18/2023 11:20:43 AM    Recent Labs: 01/26/2023: ALT 8; Hemoglobin 12.0; Platelet Count 342 06/22/2023: BUN 21; Creatinine, Ser 1.53; Potassium 4.3; Sodium 141   Recent Lipid Panel    Component Value Date/Time   CHOL 181 05/01/2022 1559   TRIG 141 05/01/2022 1559   HDL 50 05/01/2022 1559   CHOLHDL 3.6 05/01/2022 1559   CHOLHDL 3.7 08/24/2016 1634   VLDL 22 08/24/2016 1634   LDLCALC 106 (H) 05/01/2022 1559    Physical Exam:   VS:  BP 134/89   Pulse 72   Ht 5' (1.524 m)   Wt 222 lb (100.7 kg)   LMP  07/20/2016   SpO2 97%   BMI 43.36 kg/m  , BMI Body mass index is 43.36 kg/m.   GENERAL:  Well appearing HEENT: Pupils equal round and reactive, fundi not visualized, oral mucosa unremarkable NECK:  No jugular venous distention, waveform within normal limits, carotid upstroke brisk and symmetric, no bruits, no thyromegaly LYMPHATICS:  No cervical adenopathy LUNGS:  Clear to auscultation bilaterally HEART:  RRR.  PMI not displaced or sustained,S1 and S2 within normal limits, no S3, no S4, no clicks, no rubs, no murmurs ABD:  Flat, positive bowel sounds normal in frequency in pitch, no bruits, no rebound, no guarding, no midline pulsatile mass, no hepatomegaly, no splenomegaly EXT:  2 plus pulses throughout, no edema, no cyanosis no clubbing SKIN:  No rashes no nodules NEURO:  Cranial nerves II through XII grossly intact, motor grossly intact throughout PSYCH:  Cognitively intact, oriented to person place and time   ASSESSMENT/PLAN:    HTN - Diastolic BP routinely >80. DBP at home often 90s, plan to bring BP cuff to next OF to assess accuracy of home cuff.  Continue Amlodipine 10mg  daily, Spironolactone 25mg  daily.  Increase Labetolol to 150mg  BID.  Plan for renal artery duplex to assess for secondary stenosis.  Consider labs at follow up: TSH, renin aldosterone, metanephrines Renin-aldosterone at follow up due to persistent hypokalemia which required Spironolactone. Would have to hold spironolactone prior to testing, as such reconsier at follow up to assess BP control on increased dose Labetolol. Recommend aiming for 150 minutes of moderate intensity activity per week and following a heart healthy diet.    CKD - Dates back to 2013. Follows with Dr. Lequita Halt. Careful titration of antihypertensive.    OSA - CPAP compliance encouraged.   Screening for Secondary Hypertension:     Relevant Labs/Studies:    Latest Ref Rng & Units 06/22/2023    4:51 PM 01/26/2023    3:15 PM 11/14/2022     4:44 PM  Basic Labs  Sodium 134 - 144 mmol/L 141  137  140   Potassium 3.5 - 5.2 mmol/L 4.3  4.0  4.3   Creatinine 0.57 - 1.00 mg/dL 6.21  3.08  6.57        Latest Ref Rng & Units 05/26/2022    1:59 PM 01/03/2022    4:02 PM  Thyroid   TSH 0.350 - 4.500 uIU/mL 1.610  0.96        Latest Ref Rng & Units 06/30/2022    9:55 AM  Renin/Aldosterone   Aldosterone 0.0 - 30.0 ng/dL 29.5           Latest Ref Rng & Units 05/26/2022    1:59 PM  Cortisol  Cortisol  ug/dL 9.1        11/23/3084    3:05 PM  Renovascular   Renal Artery [place] Completed Yes      Disposition:    FU with MD/APP/PharmD in 2-3 months    Medication Adjustments/Labs and Tests Ordered: Current medicines are reviewed at length with the patient today.  Concerns regarding medicines are outlined above.  Orders Placed This Encounter  Procedures   EKG 12-Lead   EKG 12-Lead   VAS US RENAL ARTERY DUPLEX   Meds ordered this encounter  Medications   labetalol (NORMODYNE) 100 MG tablet    Sig: Take 1.5 tablets (150 mg total) by mouth 2 (two) times daily.    Dispense:  270 tablet    Refill:  3     Signed, Alver Sorrow, NP  08/18/2023 11:36 AM    [facility]

## 2023-08-17 ENCOUNTER — Telehealth: Payer: Self-pay | Admitting: Neurology

## 2023-08-17 ENCOUNTER — Other Ambulatory Visit: Payer: Self-pay

## 2023-08-17 ENCOUNTER — Other Ambulatory Visit (HOSPITAL_BASED_OUTPATIENT_CLINIC_OR_DEPARTMENT_OTHER): Payer: Self-pay

## 2023-08-17 MED ORDER — VITAMIN D (ERGOCALCIFEROL) 1.25 MG (50000 UNIT) PO CAPS
50000.0000 [IU] | ORAL_CAPSULE | ORAL | 4 refills | Status: DC
  Filled 2023-08-17: qty 4, 28d supply, fill #0
  Filled 2023-10-25: qty 4, 28d supply, fill #1
  Filled 2023-12-17: qty 4, 28d supply, fill #2

## 2023-08-17 NOTE — Telephone Encounter (Signed)
 Patient calling for a Status of her PA.

## 2023-08-17 NOTE — Telephone Encounter (Signed)
 Patient wanted to know if she can get samples of Ubrelvy and Qulitpa until her PA is done.    Patient advised of PA pending.

## 2023-08-17 NOTE — Telephone Encounter (Signed)
 Patient called to see what the status of her Ubrelvy.

## 2023-08-17 NOTE — Telephone Encounter (Signed)
 Pt called stating she trying to get refills on her two medication from [facility] but is having a hard time. She would like to talk with sheena

## 2023-08-17 NOTE — Telephone Encounter (Signed)
 Patient advised,  OK.  I got the samples

## 2023-08-18 ENCOUNTER — Encounter (HOSPITAL_BASED_OUTPATIENT_CLINIC_OR_DEPARTMENT_OTHER): Payer: Self-pay | Admitting: Family

## 2023-08-20 ENCOUNTER — Other Ambulatory Visit (HOSPITAL_COMMUNITY): Payer: Self-pay

## 2023-08-20 NOTE — Telephone Encounter (Signed)
 Patient has Medicaid and the primary is no longer active and Medicaid still thinks they have a Editor, commissioning. Patient must call their Medicaid Case Worker in order to get the Primary Removed.

## 2023-08-20 NOTE — Telephone Encounter (Signed)
 Patient advised of PA note. Will call back once it is fix.

## 2023-08-21 ENCOUNTER — Encounter (HOSPITAL_COMMUNITY): Payer: Self-pay | Admitting: Emergency Medicine

## 2023-08-21 ENCOUNTER — Other Ambulatory Visit: Payer: Self-pay

## 2023-08-21 ENCOUNTER — Emergency Department (HOSPITAL_COMMUNITY)
Admission: EM | Admit: 2023-08-21 | Discharge: 2023-08-23 | Disposition: A | Discharge status: Other Acute Inpatient Hospital | Attending: Emergency Medicine | Admitting: Emergency Medicine

## 2023-08-21 ENCOUNTER — Ambulatory Visit (HOSPITAL_COMMUNITY)
Admission: EM | Admit: 2023-08-21 | Discharge: 2023-08-21 | Disposition: A | Discharge status: Other Acute Inpatient Hospital

## 2023-08-21 DIAGNOSIS — F32A Depression, unspecified: Secondary | ICD-10-CM | POA: Insufficient documentation

## 2023-08-21 DIAGNOSIS — F329 Major depressive disorder, single episode, unspecified: Secondary | ICD-10-CM | POA: Diagnosis present

## 2023-08-21 DIAGNOSIS — R45851 Suicidal ideations: Secondary | ICD-10-CM | POA: Insufficient documentation

## 2023-08-21 DIAGNOSIS — F332 Major depressive disorder, recurrent severe without psychotic features: Secondary | ICD-10-CM

## 2023-08-21 LAB — BASIC METABOLIC PANEL
Anion gap: 9 (ref 5–15)
BUN: 20 mg/dL (ref 6–20)
CO2: 28 mmol/L (ref 22–32)
Calcium: 9.3 mg/dL (ref 8.9–10.3)
Chloride: 98 mmol/L (ref 98–111)
Creatinine, Ser: 1.56 mg/dL — ABNORMAL HIGH (ref 0.44–1.00)
GFR, Estimated: 38 mL/min — ABNORMAL LOW (ref >60)
Glucose, Bld: 123 mg/dL — ABNORMAL HIGH (ref 70–99)
Potassium: 3.9 mmol/L (ref 3.5–5.1)
Sodium: 135 mmol/L (ref 135–145)

## 2023-08-21 LAB — CBC WITH DIFFERENTIAL/PLATELET
Abs Immature Granulocytes: 0.09 10*3/uL — ABNORMAL HIGH (ref 0.00–0.07)
Basophils Absolute: 0.1 10*3/uL (ref 0.0–0.1)
Basophils Relative: 1 %
Eosinophils Absolute: 0.1 10*3/uL (ref 0.0–0.5)
Eosinophils Relative: 1 %
HCT: 47.6 % — ABNORMAL HIGH (ref 36.0–46.0)
Hemoglobin: 14 g/dL (ref 12.0–15.0)
Immature Granulocytes: 1 %
Lymphocytes Relative: 22 %
Lymphs Abs: 3.4 10*3/uL (ref 0.7–4.0)
MCH: 23.4 pg — ABNORMAL LOW (ref 26.0–34.0)
MCHC: 29.4 g/dL — ABNORMAL LOW (ref 30.0–36.0)
MCV: 79.6 fL — ABNORMAL LOW (ref 80.0–100.0)
Monocytes Absolute: 0.8 10*3/uL (ref 0.1–1.0)
Monocytes Relative: 5 %
Neutro Abs: 11 10*3/uL — ABNORMAL HIGH (ref 1.7–7.7)
Neutrophils Relative %: 70 %
Platelets: 357 10*3/uL (ref 150–400)
RBC: 5.98 MIL/uL — ABNORMAL HIGH (ref 3.87–5.11)
RDW: 15.6 % — ABNORMAL HIGH (ref 11.5–15.5)
WBC: 15.5 10*3/uL — ABNORMAL HIGH (ref 4.0–10.5)
nRBC: 0 % (ref 0.0–0.2)

## 2023-08-21 LAB — SALICYLATE LEVEL: Salicylate Lvl: <7 mg/dL — ABNORMAL LOW (ref 7.0–30.0)

## 2023-08-21 LAB — ACETAMINOPHEN LEVEL: Acetaminophen (Tylenol), Serum: <10 ug/mL — ABNORMAL LOW (ref 10–30)

## 2023-08-21 LAB — RAPID URINE DRUG SCREEN, HOSP PERFORMED
Amphetamines: NOT DETECTED
Barbiturates: NOT DETECTED
Benzodiazepines: NOT DETECTED
Cocaine: NOT DETECTED
Opiates: NOT DETECTED
Tetrahydrocannabinol: POSITIVE — AB

## 2023-08-21 LAB — ETHANOL: Alcohol, Ethyl (B): <10 mg/dL (ref <10)

## 2023-08-21 LAB — HCG, SERUM, QUALITATIVE: Preg, Serum: NEGATIVE

## 2023-08-21 MED ORDER — LABETALOL HCL 100 MG PO TABS
150.0000 mg | ORAL_TABLET | Freq: Two times a day (BID) | ORAL | Status: DC
  Administered 2023-08-22 – 2023-08-23 (×3): 150 mg via ORAL
  Filled 2023-08-21 (×3): qty 2

## 2023-08-21 MED ORDER — SPIRONOLACTONE 25 MG PO TABS
25.0000 mg | ORAL_TABLET | Freq: Once | ORAL | Status: AC
  Administered 2023-08-21: 25 mg via ORAL
  Filled 2023-08-21: qty 1

## 2023-08-21 MED ORDER — SPIRONOLACTONE 25 MG PO TABS
25.0000 mg | ORAL_TABLET | Freq: Every day | ORAL | Status: DC
  Administered 2023-08-22 – 2023-08-23 (×2): 25 mg via ORAL
  Filled 2023-08-21 (×2): qty 1

## 2023-08-21 MED ORDER — AMLODIPINE BESYLATE 5 MG PO TABS
10.0000 mg | ORAL_TABLET | Freq: Every day | ORAL | Status: DC
  Administered 2023-08-22: 10 mg via ORAL
  Filled 2023-08-21: qty 2

## 2023-08-21 MED ORDER — SERTRALINE HCL 50 MG PO TABS
100.0000 mg | ORAL_TABLET | Freq: Every day | ORAL | Status: DC
  Administered 2023-08-22 – 2023-08-23 (×2): 100 mg via ORAL
  Filled 2023-08-21 (×2): qty 2

## 2023-08-21 MED ORDER — FAMOTIDINE 20 MG PO TABS: 20.0000 mg | ORAL_TABLET | Freq: Every day | ORAL | Status: DC | PRN

## 2023-08-21 MED ORDER — TRAMADOL HCL 50 MG PO TABS
50.0000 mg | ORAL_TABLET | Freq: Two times a day (BID) | ORAL | Status: DC | PRN
  Administered 2023-08-21: 50 mg via ORAL
  Filled 2023-08-21: qty 1

## 2023-08-21 MED ORDER — BUPROPION HCL ER (SR) 150 MG PO TB12
150.0000 mg | ORAL_TABLET | Freq: Two times a day (BID) | ORAL | Status: DC
  Administered 2023-08-22 – 2023-08-23 (×3): 150 mg via ORAL
  Filled 2023-08-21 (×3): qty 1

## 2023-08-21 MED ORDER — AMLODIPINE BESYLATE 5 MG PO TABS
10.0000 mg | ORAL_TABLET | Freq: Once | ORAL | Status: AC
  Administered 2023-08-21: 10 mg via ORAL
  Filled 2023-08-21: qty 2

## 2023-08-21 MED ORDER — PANTOPRAZOLE SODIUM 20 MG PO TBEC
20.0000 mg | DELAYED_RELEASE_TABLET | Freq: Every day | ORAL | Status: DC
  Administered 2023-08-21 – 2023-08-23 (×3): 20 mg via ORAL
  Filled 2023-08-21 (×4): qty 1

## 2023-08-21 MED ORDER — ATOGEPANT 60 MG PO TABS: 60.0000 mg | ORAL_TABLET | Freq: Every day | ORAL | Status: DC

## 2023-08-21 NOTE — ED Notes (Addendum)
 Pt states she takes Protonix with food, will give following eating per pt request.

## 2023-08-21 NOTE — ED Notes (Signed)
 Pt has x2 belonging bags, labeled, placed in triage belonging cabinet, BOTTOM RIGHT.

## 2023-08-21 NOTE — ED Notes (Signed)
 Pt provided [place] sandwich, ginger ale, saltines per request.

## 2023-08-21 NOTE — ED Provider Notes (Signed)
 [place] EMERGENCY DEPARTMENT AT [facility] Provider Note   CSN: [id number] Arrival date & time: 08/21/23  2039     History  Chief Complaint  Patient presents with   Suicidal    Janice [name] is a 58 y.o. female.With a history of depression anxiety and suicidal ideation who presents to the ED for suicidal ideation.  Patient was seen earlier today at behavioral health urgent care where she voiced increased depression and suicidality.  There was a plan for inpatient psychiatric hospitalization however she does sleep with a CPAP at night and behavioral health urgent care was unable to accommodate this medical needs so she was transferred to the ED in order to sleep with her CPAP with plan for ongoing bed search.  Patient is here under her own volition and agrees with the plan for inpatient psychiatric hospitalization.  She did not make any attempt to harm herself or overdose today.  Denies drug and alcohol use.  No systemic complaints at this time  HPI     Home Medications Prior to Admission medications   Medication Sig Start Date End Date Taking? Authorizing Provider  PRESCRIPTION MEDICATION CPAP- At bedtime   Yes [provider]  amLODipine (NORVASC) 10 MG tablet Take 1 tablet (10 mg total) by mouth once nightly at bedtime. 06/22/23   Marcine Matar, MD  Atogepant (QULIPTA) 60 MG TABS Take 1 tablet (60 mg total) by mouth daily. Patient not taking: Reported on 08/16/2023 07/18/23   Drema Dallas, DO  buPROPion [facility] SR) 150 MG 12 hr tablet Take 1 tablet (150 mg total) by mouth 2 (two) times daily. 06/22/23   Marcine Matar, MD  conjugated estrogens (PREMARIN) vaginal cream Place 1 Applicatorful vaginally once a week. 05/01/23   Warden Fillers, MD  famotidine (PEPCID) 20 MG tablet Take 1 tablet (20 mg total) by mouth daily as needed for heartburn or indigestion. 06/26/23   Armbruster, Willaim Rayas, MD  labetalol (NORMODYNE) 100 MG tablet Take 1.5 tablets  (150 mg total) by mouth 2 (two) times daily. 08/16/23   Alver Sorrow, NP  pantoprazole (PROTONIX) 20 MG tablet Take 1 tablet (20 mg total) by mouth daily. Patient not taking: Reported on 08/16/2023 06/22/23   Marcine Matar, MD  polyethylene glycol powder (GLYCOLAX/MIRALAX) 17 GM/SCOOP powder Take 17 g by mouth daily as needed (for constipation). 06/22/23   Marcine Matar, MD  sertraline (ZOLOFT) 100 MG tablet Take 1 tablet (100 mg total) by mouth daily. 06/22/23   Marcine Matar, MD  spironolactone (ALDACTONE) 25 MG tablet Take 1 tablet (25 mg total) by mouth daily. 06/22/23   Marcine Matar, MD  traMADol (ULTRAM) 50 MG tablet Take 1 tablet (50 mg total) by mouth every 12 (twelve) hours as needed. 09/13/22   Cristie Hem, PA-C  Ubrogepant (UBRELVY) 100 MG TABS Take 1 tablet (100 mg total) by mouth as needed. May repeat after 2 hours.  Maximum 2 tablets in 24 hours. Patient not taking: Reported on 08/16/2023 07/18/23   Drema Dallas, DO  Vitamin D, Ergocalciferol, (DRISDOL) 1.25 MG (50000 UNIT) CAPS capsule Take 1 capsule (50,000 Units total) by mouth once a week. 08/16/23         Allergies    Losartan and Neomycin sulfate    Review of Systems   Review of Systems  Physical Exam Updated Vital Signs BP (!) 183/127 (BP Location: Right Arm)   Pulse 90   Temp 98.1  F (36.7 C) (Oral)   Resp 16   Ht 5' (1.524 m)   Wt 100.7 kg   LMP 07/20/2016   SpO2 100%   BMI 43.36 kg/m  Physical Exam Vitals and nursing note reviewed.  HENT:     Head: Normocephalic and atraumatic.  Eyes:     Pupils: Pupils are equal, round, and reactive to light.  Cardiovascular:     Rate and Rhythm: Normal rate and regular rhythm.  Pulmonary:     Effort: Pulmonary effort is normal.     Breath sounds: Normal breath sounds.  Abdominal:     Palpations: Abdomen is soft.     Tenderness: There is no abdominal tenderness.  Skin:    General: Skin is warm and dry.  Neurological:     Mental Status: She  is alert.  Psychiatric:        Mood and Affect: Mood normal.     ED Results / Procedures / Treatments   Labs (all labs ordered are listed, but only abnormal results are displayed) Labs Reviewed  BASIC METABOLIC PANEL - Abnormal; Notable for the following components:      Result Value   Glucose, Bld 123 (*)    Creatinine, Ser 1.56 (*)    GFR, Estimated 38 (*)    All other components within normal limits  CBC WITH DIFFERENTIAL/PLATELET - Abnormal; Notable for the following components:   WBC 15.5 (*)    RBC 5.98 (*)    HCT 47.6 (*)    MCV 79.6 (*)    MCH 23.4 (*)    MCHC 29.4 (*)    RDW 15.6 (*)    Neutro Abs 11.0 (*)    Abs Immature Granulocytes 0.09 (*)    All other components within normal limits  RAPID URINE DRUG SCREEN, HOSP PERFORMED - Abnormal; Notable for the following components:   Tetrahydrocannabinol POSITIVE (*)    All other components within normal limits  ACETAMINOPHEN LEVEL - Abnormal; Notable for the following components:   Acetaminophen (Tylenol), Serum <10 (*)    All other components within normal limits  SALICYLATE LEVEL - Abnormal; Notable for the following components:   Salicylate Lvl <7.0 (*)    All other components within normal limits  HCG, SERUM, QUALITATIVE  ETHANOL    EKG EKG Interpretation Date/Time:  Tuesday August 21 2023 22:32:59 EDT Ventricular Rate:  89 PR Interval:  184 QRS Duration:  101 QT Interval:  385 QTC Calculation: 469 R Axis:   34  Text Interpretation: Sinus rhythm LAE, consider biatrial enlargement Low voltage, precordial leads Confirmed by Estelle June [phone]) on 08/21/2023 11:20:10 PM  Radiology No results found.  Procedures Procedures    Medications Ordered in ED Medications  amLODipine (NORVASC) tablet 10 mg (has no administration in time range)  Atogepant TABS 60 mg (has no administration in time range)  buPROPion (WELLBUTRIN SR) 12 hr tablet 150 mg (has no administration in time range)  famotidine (PEPCID)  tablet 20 mg (has no administration in time range)  labetalol (NORMODYNE) tablet 150 mg (has no administration in time range)  pantoprazole (PROTONIX) EC tablet 20 mg (20 mg Oral Given 08/21/23 2317)  sertraline (ZOLOFT) tablet 100 mg (has no administration in time range)  spironolactone (ALDACTONE) tablet 25 mg (has no administration in time range)  traMADol (ULTRAM) tablet 50 mg (50 mg Oral Given 08/21/23 2246)  amLODipine (NORVASC) tablet 10 mg (10 mg Oral Given 08/21/23 2301)  spironolactone (ALDACTONE) tablet 25 mg (25 mg Oral Given  08/21/23 2317)    ED Course/ Medical Decision Making/ A&P Clinical Course as of 08/21/23 2322  Tue Aug 21, 2023  2319 Laboratory screening notable for positive THC.  Pregnancy negative.  Alcohol Tylenol salicylates negative.  Medically cleared for psych placement [MP]    Clinical Course User Index [MP] Royanne Foots, DO                                 Medical Decision Making 58 year old female with history as above sent over from behavioral health urgent care.  Endorses active depression and SI.  No attempts.  Sleeps with a CPAP at night due to sleep apnea but they were unable to accommodate this need every [place] [facility] urgent care.  Will obtain psychiatric screening labs and TTS consult and allow her to sleep with her CPAP here in the emergency department awaiting inpatient psychiatric bed availability.  Amount and/or Complexity of Data Reviewed Labs: ordered.           Final Clinical Impression(s) / ED Diagnoses Final diagnoses:  Suicidal thoughts  Depression, unspecified depression type    Rx / DC Orders ED Discharge Orders     None         Royanne Foots, DO 08/21/23 2322

## 2023-08-21 NOTE — ED Notes (Signed)
Pt wanded by security with no findings. 
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
## 2023-08-21 NOTE — ED Triage Notes (Signed)
 Patient transferred from [facility]. Patient came in with SI and waiting for inpatient psychiatric treatment. Patient need a CPAP at night.
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
## 2023-08-22 ENCOUNTER — Other Ambulatory Visit (HOSPITAL_COMMUNITY): Payer: Self-pay

## 2023-08-22 ENCOUNTER — Telehealth: Payer: Self-pay | Admitting: Pharmacy Technician

## 2023-08-22 DIAGNOSIS — F32A Depression, unspecified: Secondary | ICD-10-CM | POA: Diagnosis not present

## 2023-08-22 LAB — SARS CORONAVIRUS 2 BY RT PCR: SARS Coronavirus 2 by RT PCR: NEGATIVE

## 2023-08-22 NOTE — ED Notes (Signed)
 Pt medically cleared with current BP per Eudelia Bunch, MD

## 2023-08-22 NOTE — ED Notes (Signed)
 EDP [facility] alerted  to manuel BP 158/112 no new orders obtained.

## 2023-08-22 NOTE — ED Notes (Signed)
 Belongings bags taken over to [employer] by Reita Cliche, Charity fundraiser

## 2023-08-22 NOTE — Progress Notes (Signed)
   08/22/23 0117  BiPAP/CPAP/SIPAP  $ Non-Invasive Home Ventilator  Initial  BiPAP/CPAP/SIPAP Pt Type Adult  BiPAP/CPAP/SIPAP DREAMSTATIOND  Mask Type Nasal mask (pt wears nasal pillows at home)  Dentures removed? Not applicable  Mask Size Medium  Respiratory Rate 16 breaths/min  FiO2 (%) 21 %  Patient Home Equipment No  Auto Titrate Yes (automode, min4cm, max20cm h2o)  CPAP/SIPAP surface wiped down Yes  BiPAP/CPAP /SiPAP Vitals  Pulse Rate 88  Resp 16  SpO2 94 %

## 2023-08-22 NOTE — Telephone Encounter (Signed)
 Pharmacy Patient Advocate Encounter   Received notification from Pt Calls Messages that prior authorization for Qulipta 60MG  tablets is required/requested.   Insurance verification completed.   The patient is insured through [facility] .   Per test claim:  Aimovig, Ajovy, Emgality is preferred by the insurance.  If suggested medication is appropriate, Please send in a new RX and discontinue this one. If not, please advise as to why it's not appropriate so that we may request a Prior Authorization. Please note, some preferred medications may still require a PA.  If the suggested medications have not been trialed and there are no contraindications to their use, the PA will not be submitted, as it will not be approved.

## 2023-08-22 NOTE — Telephone Encounter (Signed)
 PA request has been Received. New Encounter has been or will be created for follow up. For additional info see Pharmacy Prior Auth telephone encounter from 08/22/2023.

## 2023-08-22 NOTE — Progress Notes (Signed)
   08/22/23 2214  BiPAP/CPAP/SIPAP  BiPAP/CPAP/SIPAP Pt Type Adult  BiPAP/CPAP/SIPAP DREAMSTATIOND  Mask Type Nasal mask  Dentures removed? Not applicable  Mask Size Medium  Respiratory Rate 16 breaths/min  FiO2 (%) 21 %  Patient Home Equipment No  Auto Titrate Yes (automode, min4cm, max20cm h2o)  CPAP/SIPAP surface wiped down Yes  BiPAP/CPAP /SiPAP Vitals  Pulse Rate 72  Resp 16  SpO2 97 %

## 2023-08-22 NOTE — ED Notes (Signed)
 Patient has been calm, cooperative, and in good spirits during this shift. She has performed her ADL's independently and expresses her needs. She is aware that she will be transferred to a different facility for treatment and has signed the voluntary consent form. Currently, she is visiting with her significant other.

## 2023-08-23 ENCOUNTER — Other Ambulatory Visit: Payer: Self-pay

## 2023-08-23 ENCOUNTER — Inpatient Hospital Stay
Admission: AD | Admit: 2023-08-23 | Discharge: 2023-08-29 | DRG: 885 | Disposition: A | Discharge status: Home / Self Care | Source: Intra-hospital | Attending: Psychiatry | Admitting: Psychiatry

## 2023-08-23 ENCOUNTER — Encounter: Payer: Self-pay | Admitting: Psychiatry

## 2023-08-23 DIAGNOSIS — Z823 Family history of stroke: Secondary | ICD-10-CM | POA: Diagnosis not present

## 2023-08-23 DIAGNOSIS — Z800 Family history of malignant neoplasm of digestive organs: Secondary | ICD-10-CM | POA: Diagnosis not present

## 2023-08-23 DIAGNOSIS — F4310 Post-traumatic stress disorder, unspecified: Secondary | ICD-10-CM | POA: Diagnosis present

## 2023-08-23 DIAGNOSIS — I129 Hypertensive chronic kidney disease with stage 1 through stage 4 chronic kidney disease, or unspecified chronic kidney disease: Secondary | ICD-10-CM | POA: Diagnosis present

## 2023-08-23 DIAGNOSIS — Z62810 Personal history of physical and sexual abuse in childhood: Secondary | ICD-10-CM

## 2023-08-23 DIAGNOSIS — Z818 Family history of other mental and behavioral disorders: Secondary | ICD-10-CM | POA: Diagnosis not present

## 2023-08-23 DIAGNOSIS — Z811 Family history of alcohol abuse and dependence: Secondary | ICD-10-CM

## 2023-08-23 DIAGNOSIS — G4733 Obstructive sleep apnea (adult) (pediatric): Secondary | ICD-10-CM | POA: Diagnosis present

## 2023-08-23 DIAGNOSIS — Z820 Family history of epilepsy and other diseases of the nervous system: Secondary | ICD-10-CM | POA: Diagnosis not present

## 2023-08-23 DIAGNOSIS — Z555 Less than a high school diploma: Secondary | ICD-10-CM

## 2023-08-23 DIAGNOSIS — R45851 Suicidal ideations: Secondary | ICD-10-CM | POA: Diagnosis present

## 2023-08-23 DIAGNOSIS — F331 Major depressive disorder, recurrent, moderate: Principal | ICD-10-CM | POA: Diagnosis present

## 2023-08-23 DIAGNOSIS — Z8249 Family history of ischemic heart disease and other diseases of the circulatory system: Secondary | ICD-10-CM | POA: Diagnosis not present

## 2023-08-23 DIAGNOSIS — N1832 Chronic kidney disease, stage 3b: Secondary | ICD-10-CM | POA: Diagnosis present

## 2023-08-23 DIAGNOSIS — F32A Depression, unspecified: Secondary | ICD-10-CM | POA: Diagnosis not present

## 2023-08-23 DIAGNOSIS — Z888 Allergy status to other drugs, medicaments and biological substances status: Secondary | ICD-10-CM | POA: Diagnosis not present

## 2023-08-23 DIAGNOSIS — R7303 Prediabetes: Secondary | ICD-10-CM | POA: Diagnosis present

## 2023-08-23 DIAGNOSIS — F332 Major depressive disorder, recurrent severe without psychotic features: Secondary | ICD-10-CM | POA: Diagnosis not present

## 2023-08-23 DIAGNOSIS — Z6841 Body Mass Index (BMI) 40.0 and over, adult: Secondary | ICD-10-CM

## 2023-08-23 DIAGNOSIS — Z813 Family history of other psychoactive substance abuse and dependence: Secondary | ICD-10-CM | POA: Diagnosis not present

## 2023-08-23 DIAGNOSIS — Z87891 Personal history of nicotine dependence: Secondary | ICD-10-CM

## 2023-08-23 DIAGNOSIS — F329 Major depressive disorder, single episode, unspecified: Secondary | ICD-10-CM | POA: Diagnosis present

## 2023-08-23 DIAGNOSIS — Z79899 Other long term (current) drug therapy: Secondary | ICD-10-CM

## 2023-08-23 DIAGNOSIS — Z560 Unemployment, unspecified: Secondary | ICD-10-CM | POA: Diagnosis not present

## 2023-08-23 DIAGNOSIS — G43909 Migraine, unspecified, not intractable, without status migrainosus: Secondary | ICD-10-CM | POA: Diagnosis present

## 2023-08-23 DIAGNOSIS — F411 Generalized anxiety disorder: Secondary | ICD-10-CM | POA: Diagnosis present

## 2023-08-23 DIAGNOSIS — Z841 Family history of disorders of kidney and ureter: Secondary | ICD-10-CM

## 2023-08-23 DIAGNOSIS — M1711 Unilateral primary osteoarthritis, right knee: Secondary | ICD-10-CM | POA: Diagnosis present

## 2023-08-23 MED ORDER — OLANZAPINE 10 MG IM SOLR: 5.0000 mg | Freq: Three times a day (TID) | INTRAMUSCULAR | Status: DC | PRN

## 2023-08-23 MED ORDER — TRAMADOL HCL 50 MG PO TABS
50.0000 mg | ORAL_TABLET | Freq: Two times a day (BID) | ORAL | Status: DC | PRN
  Administered 2023-08-24 – 2023-08-28 (×6): 50 mg via ORAL
  Filled 2023-08-23 (×6): qty 1

## 2023-08-23 MED ORDER — ATOGEPANT 60 MG PO TABS: 60.0000 mg | ORAL_TABLET | Freq: Every day | ORAL | Status: DC

## 2023-08-23 MED ORDER — HALOPERIDOL 5 MG PO TABS: 5.0000 mg | ORAL_TABLET | Freq: Three times a day (TID) | ORAL | Status: DC | PRN

## 2023-08-23 MED ORDER — LORAZEPAM 2 MG/ML IJ SOLN: 2.0000 mg | Freq: Three times a day (TID) | INTRAMUSCULAR | Status: DC | PRN

## 2023-08-23 MED ORDER — HALOPERIDOL LACTATE 5 MG/ML IJ SOLN: 10.0000 mg | Freq: Three times a day (TID) | INTRAMUSCULAR | Status: DC | PRN

## 2023-08-23 MED ORDER — TRAZODONE HCL 50 MG PO TABS
50.0000 mg | ORAL_TABLET | Freq: Every evening | ORAL | Status: DC | PRN
  Administered 2023-08-23 – 2023-08-28 (×5): 50 mg via ORAL
  Filled 2023-08-23 (×5): qty 1

## 2023-08-23 MED ORDER — DIPHENHYDRAMINE HCL 25 MG PO CAPS: 50.0000 mg | ORAL_CAPSULE | Freq: Three times a day (TID) | ORAL | Status: DC | PRN

## 2023-08-23 MED ORDER — SERTRALINE HCL 50 MG PO TABS
100.0000 mg | ORAL_TABLET | Freq: Every day | ORAL | Status: DC
  Administered 2023-08-24 – 2023-08-29 (×6): 100 mg via ORAL
  Filled 2023-08-23 (×6): qty 2

## 2023-08-23 MED ORDER — LABETALOL HCL 100 MG PO TABS
150.0000 mg | ORAL_TABLET | Freq: Two times a day (BID) | ORAL | Status: DC
  Administered 2023-08-23 – 2023-08-29 (×12): 150 mg via ORAL
  Filled 2023-08-23 (×12): qty 1.5

## 2023-08-23 MED ORDER — DIPHENHYDRAMINE HCL 50 MG/ML IJ SOLN: 50.0000 mg | Freq: Three times a day (TID) | INTRAMUSCULAR | Status: DC | PRN

## 2023-08-23 MED ORDER — AMLODIPINE BESYLATE 5 MG PO TABS
10.0000 mg | ORAL_TABLET | Freq: Every day | ORAL | Status: DC
  Administered 2023-08-23 – 2023-08-28 (×6): 10 mg via ORAL
  Filled 2023-08-23 (×6): qty 2

## 2023-08-23 MED ORDER — ALUM & MAG HYDROXIDE-SIMETH 200-200-20 MG/5ML PO SUSP: 30.0000 mL | ORAL | Status: DC | PRN

## 2023-08-23 MED ORDER — ACETAMINOPHEN 325 MG PO TABS: 650.0000 mg | ORAL_TABLET | Freq: Four times a day (QID) | ORAL | Status: DC | PRN

## 2023-08-23 MED ORDER — BUPROPION HCL ER (SR) 150 MG PO TB12
150.0000 mg | ORAL_TABLET | Freq: Two times a day (BID) | ORAL | Status: DC
  Administered 2023-08-23 – 2023-08-29 (×12): 150 mg via ORAL
  Filled 2023-08-23 (×12): qty 1

## 2023-08-23 MED ORDER — SPIRONOLACTONE 25 MG PO TABS
25.0000 mg | ORAL_TABLET | Freq: Every day | ORAL | Status: DC
  Administered 2023-08-24 – 2023-08-29 (×6): 25 mg via ORAL
  Filled 2023-08-23 (×6): qty 1

## 2023-08-23 MED ORDER — HYDROXYZINE HCL 25 MG PO TABS
25.0000 mg | ORAL_TABLET | Freq: Three times a day (TID) | ORAL | Status: DC | PRN
  Administered 2023-08-23 – 2023-08-28 (×8): 25 mg via ORAL
  Filled 2023-08-23 (×8): qty 1

## 2023-08-23 MED ORDER — MAGNESIUM HYDROXIDE 400 MG/5ML PO SUSP: 30.0000 mL | Freq: Every day | ORAL | Status: DC | PRN

## 2023-08-23 NOTE — Progress Notes (Signed)
 Pt was accepted to [facility] 08/23/2023  Bed Assignment Pending   Address: [address] [place], [place], [place] [address]  [facility] Fax: [phone]  Pt meets inpatient criteria per: Olin Pia NP  Attending Physician will be Dr. Callie Fielding  Report can be called to: [phone]  Pt can arrive after discharges   Care Team notified: Alona Bene, PMHNP, Jacquelynn Cree NT, [facility] RN   [place] Ka Flammer, MSW, [facility] 08/23/2023 10:47 AM

## 2023-08-23 NOTE — Plan of Care (Signed)
  Problem: Education: Goal: Knowledge of General Education information will improve Description: Including pain rating scale, medication(s)/side effects and non-pharmacologic comfort measures Outcome: Not Progressing   Problem: Health Behavior/Discharge Planning: Goal: Ability to manage health-related needs will improve Outcome: Not Progressing   Problem: Education: Goal: Utilization of techniques to improve thought processes will improve Outcome: Not Progressing Goal: Knowledge of the prescribed therapeutic regimen will improve Outcome: Not Progressing   Problem: Education: Goal: Ability to state activities that reduce stress will improve Outcome: Not Progressing

## 2023-08-23 NOTE — Tx Team (Signed)
 Initial Treatment Plan 08/23/2023 2:52 PM Janice [name] JYN:[id number]    PATIENT STRESSORS: Occupational concerns   Traumatic event     PATIENT STRENGTHS: Ability for insight  Capable of independent living  Motivation for treatment/growth    PATIENT IDENTIFIED PROBLEMS:   "I suffer from depression and anxiety."                   DISCHARGE CRITERIA:  Ability to meet basic life and health needs Adequate post-discharge living arrangements Improved stabilization in mood, thinking, and/or behavior Safe-care adequate arrangements made  PRELIMINARY DISCHARGE PLAN: Return to previous living arrangement  PATIENT/FAMILY INVOLVEMENT: This treatment plan has been presented to and reviewed with the patient, Janice [name], and/or family member. The patient has been given the opportunity to ask questions and make suggestions.  Luane School, RN 08/23/2023, 2:52 PM

## 2023-08-23 NOTE — ED Provider Notes (Signed)
 Emergency Medicine Observation Re-evaluation Note  Janice [name] is a 58 y.o. female, seen on rounds today.  Pt initially presented to the ED for complaints of Suicidal Currently, the patient is resting   Physical Exam  BP 119/82 (BP Location: Right Arm)   Pulse 64   Temp 98.4 F (36.9 C) (Oral)   Resp 16   Ht 5' (1.524 m)   Wt 100.7 kg   LMP 07/20/2016   SpO2 97%   BMI 43.36 kg/m  Physical Exam General: NAD  Cardiac: RR Lungs: non-labored  Psych: calm and cooperative   ED Course / MDM  EKG:EKG Interpretation Date/Time:  Tuesday August 21 2023 22:32:59 EDT Ventricular Rate:  89 PR Interval:  184 QRS Duration:  101 QT Interval:  385 QTC Calculation: 469 R Axis:   34  Text Interpretation: Sinus rhythm LAE, consider biatrial enlargement Low voltage, precordial leads Confirmed by Estelle June [id number]) on 08/21/2023 11:20:10 PM  I have reviewed the labs performed to date as well as medications administered while in observation.  Recent changes in the last 24 hours include none.  Plan  Current plan is for inpatient psych.    Coral Spikes, DO 08/23/23 [phone]

## 2023-08-23 NOTE — ED Notes (Signed)
 [facility] contacted and en route.

## 2023-08-23 NOTE — Group Note (Signed)
 Recreation Therapy Group Note   Group Topic:Stress Management  Group Date: 08/23/2023 Start Time: 1100 End Time: 1140 Facilitators: Rosina Lowenstein, LRT, CTRS Location:  C[facility] Room  Group Description: Meditation. LRT and patients discussed what they know about meditation and mindfulness. LRT played a Deep Breathing Meditation exercise script for patients to follow along to. LRT and patients discussed how meditation and deep breathing can be used as a coping skill post--discharge to help manage symptoms of stress.   Goal Area(s) Addressed: Patient will practice using relaxation technique. Patient will identify a new coping skill.  Patient will follow multistep directions to reduce anxiety and stress.   Affect/Mood: N/A   Participation Level: Did not attend    Clinical Observations/Individualized Feedback: Patient did not attend group due to not being on the unit yet.   Plan: Continue to engage patient in RT group sessions 2-3x/week.   Rosina Lowenstein, LRT, CTRS 08/23/2023 1:50 PM

## 2023-08-23 NOTE — Group Note (Signed)
 Recreation Therapy Group Note   Group Topic:Communication  Group Date: 08/23/2023 Start Time: 1445 End Time: 1535 Facilitators: Rosina Lowenstein, LRT, CTRS Location: Courtyard  Group Description: Emotional Check in. Patient sat and talked with LRT about how they are doing and whatever else is on their mind. LRT provided active listening, reassurance and encouragement. Pts were given the opportunity to listen to music or color mandalas while they talk.    Goal Area(s) Addressed: Patient will engage in conversation with LRT. Patient will communicate their wants, needs, or questions.  Patient will practice a new coping skill of "talking to someone".   Affect/Mood: N/A   Participation Level: Did not attend    Clinical Observations/Individualized Feedback: Patient did not attend group.   Plan: Continue to engage patient in RT group sessions 2-3x/week.   Rosina Lowenstein, LRT, CTRS 08/23/2023 5:07 PM

## 2023-08-23 NOTE — ED Notes (Signed)
 Report called to [facility] at [facility]

## 2023-08-23 NOTE — Progress Notes (Signed)
 Patient is a 58 year old female admitted voluntarily to the [facility] floor from [facility] for suicidal thoughts and complaints of worsening depression and anxiety at approx. 1300. Patient made statements of "I want to leave this world." Patient presents to assessment via a wheelchair but is ambulatory with a walker. She is A+O x 4. She currently denies SI/HI/AVH. "I am very anxious and depressed." Patient rates this 10/10. She does agree to contract for safety on the unit. Patient's affect is sullen and speech is logical and coherent. She states that her main stressors are seeing her daughter go through surgery after having a car accident and dealing with a grandchild who was born prematurely. She currently complains of a migraine headache. Patient denies the use of a mobility aid at home but will use a walker on the unit. Reports the use of reading glasses and has them on her person. Reports last BM today, August 23, 2023. Denies smoking cigs as she has quit 1 year ago. Endorses occasional marijuana use to help with anxiety. Patient says that she drinks alcohol a few times weekly. Patient says that her support system is her daughter. Her goal while she is here is "to get some help."   Skin assessment and body search completed with [employer], MHT. Skin: warm/dry. R leg psoriasis.  No contrabands found.   Emotional support and reassurance provided throughout admission intake. Consents signed. Afterwards, oriented patient to unit, room and call light, reviewed POC with all questions answered and concerns voiced. Patient verbalized understanding. Denies any needs at this time.  Will continue to monitor with ongoing Q 15 minute safety checks.

## 2023-08-23 NOTE — ED Notes (Signed)
 Pt was accepted to [facility] 08/23/2023  Bed Assignment Pending    Address: [address] [place], [place], [place] [address]   [facility] Fax: [phone]   Pt meets inpatient criteria per: Olin Pia NP   Attending Physician will be Dr. Callie Fielding   Report can be called to: [phone]

## 2023-08-24 DIAGNOSIS — F332 Major depressive disorder, recurrent severe without psychotic features: Secondary | ICD-10-CM

## 2023-08-24 MED ORDER — POLYETHYLENE GLYCOL 3350 17 G PO PACK
17.0000 g | PACK | Freq: Every day | ORAL | Status: DC
  Administered 2023-08-24 – 2023-08-29 (×6): 17 g via ORAL
  Filled 2023-08-24 (×6): qty 1

## 2023-08-24 MED ORDER — SUMATRIPTAN SUCCINATE 50 MG PO TABS
25.0000 mg | ORAL_TABLET | Freq: Three times a day (TID) | ORAL | Status: DC | PRN
  Administered 2023-08-25 – 2023-08-27 (×3): 25 mg via ORAL
  Filled 2023-08-24 (×3): qty 1

## 2023-08-24 NOTE — BHH Suicide Risk Assessment (Signed)
 [facility] Admission Suicide Risk Assessment   Nursing information obtained from:  Patient Demographic factors:  Unemployed Loss Factors:  Decrease in vocational status Risk Reduction Factors:  Living with another person, especially a relative   Principal Problem: MDD (major depressive disorder) Diagnosis:  Principal Problem:   MDD (major depressive disorder)  Subjective Data: Janice [name] is a 58 y.o. female.With a history of depression anxiety and suicidal ideation who presents to the ED for suicidal ideation.  Patient was seen at behavioral health urgent care where she voiced increased depression and suicidality.  There was a plan for inpatient psychiatric hospitalization however she does sleep with a CPAP at night and behavioral health urgent care was unable to accommodate this medical needs so she was transferred to the ED in order to sleep with her CPAP with plan for ongoing bed search.  Denies drug and alcohol use.      Alcohol Use Disorder Identification Test Final Score (AUDIT): 4 The "Alcohol Use Disorders Identification Test", Guidelines for Use in Primary Care, Second Edition.  World Science writer [facility]). Score between 0-7:  no or low risk or alcohol related problems. Score between 8-15:  moderate risk of alcohol related problems. Score between 16-19:  high risk of alcohol related problems. Score 20 or above:  warrants further diagnostic evaluation for alcohol dependence and treatment.   CLINICAL FACTORS:   Depression:   Anhedonia Hopelessness Insomnia   Musculoskeletal: Strength & Muscle Tone: within normal limits Gait & Station: normal Patient leans: N/A  Psychiatric Specialty Exam:  Presentation  General Appearance:  Appropriate for Environment  Eye Contact: Fair  Speech: Clear and Coherent  Speech Volume: Decreased  Handedness: Right   Mood and Affect  Mood: Depressed; Anxious  Affect: Congruent   Thought Process  Thought  Processes: Goal Directed  Descriptions of Associations:Intact  Orientation:Full (Time, Place and Person)  Thought Content:Abstract Reasoning  History of Schizophrenia/Schizoaffective disorder:No  Duration of Psychotic Symptoms:Less than 6 months Hallucinations:Hallucinations: Visual; Auditory  Ideas of Reference:Paranoia  Suicidal Thoughts:Suicidal Thoughts: Yes, Active SI Active Intent and/or Plan: Without Intent  Homicidal Thoughts:Homicidal Thoughts: No   Sensorium  Memory: Immediate Fair; Recent Fair; Remote Fair  Judgment: Impaired  Insight: Shallow   Executive Functions  Concentration: Fair  Attention Span: Fair  Recall: [employer] of Knowledge: Fair  Language: Fair   Psychomotor Activity  Psychomotor Activity: Psychomotor Activity: Decreased   Assets  Assets: Communication Skills; Desire for Improvement   Sleep  Sleep: Sleep: Poor    Physical Exam: Physical Exam Constitutional:      Appearance: Normal appearance.  HENT:     Head: Normocephalic and atraumatic.     Nose: No congestion.  Eyes:     Pupils: Pupils are equal, round, and reactive to light.  Cardiovascular:     Rate and Rhythm: Regular rhythm.  Pulmonary:     Effort: Pulmonary effort is normal.  Skin:    General: Skin is warm.  Neurological:     General: No focal deficit present.     Mental Status: She is alert and oriented to person, place, and time.    Review of Systems  Constitutional:  Negative for chills and fever.  HENT:  Negative for hearing loss and sore throat.   Eyes:  Negative for blurred vision and double vision.  Respiratory:  Negative for cough and shortness of breath.   Cardiovascular:  Negative for chest pain and palpitations.  Gastrointestinal:  Negative for nausea and vomiting.  Psychiatric/Behavioral:  Positive for depression and suicidal ideas. Negative for hallucinations. The patient is nervous/anxious and has insomnia.    Blood  pressure 127/69, pulse 84, temperature 97.8 F (36.6 C), resp. rate 18, height 5' (1.524 m), weight 98.4 kg, last menstrual period 07/20/2016, SpO2 (!) 72%. Body mass index is 42.38 kg/m.   COGNITIVE FEATURES THAT CONTRIBUTE TO RISK:  Thought constriction (tunnel vision)    SUICIDE RISK:   Moderate:  Frequent suicidal ideation with limited intensity, and duration, some specificity in terms of plans, no associated intent, good self-control,  some risk factors present, and identifiable protective factors, including available and accessible social support.  PLAN OF CARE: Per H&P  I certify that inpatient services furnished can reasonably be expected to improve the patient's condition.   Lewanda Rife, MD

## 2023-08-24 NOTE — Group Note (Signed)
 Date:  08/24/2023 Time:  8:43 PM  Group Topic/Focus:  Goals Group:   The focus of this group is to help patients establish daily goals to achieve during treatment and discuss how the patient can incorporate goal setting into their daily lives to aide in recovery.    Participation Level:  Active  Participation Quality:  Appropriate  Affect:  Appropriate  Cognitive:  Appropriate  Insight: Appropriate  Engagement in Group:  Engaged  Modes of Intervention:  Activity  Additional Comments:    Otilia Kareem 08/24/2023, 8:43 PM

## 2023-08-24 NOTE — Plan of Care (Signed)
  Problem: Education: Goal: Utilization of techniques to improve thought processes will improve Outcome: Progressing Goal: Knowledge of the prescribed therapeutic regimen will improve Outcome: Progressing   Problem: Education: Goal: Ability to state activities that reduce stress will improve Outcome: Progressing

## 2023-08-24 NOTE — BH IP Treatment Plan (Signed)
 Interdisciplinary Treatment and Diagnostic Plan Update  08/24/2023 Time of Session: 10:41 AM  Janice [name] MRN: [id number]  Principal Diagnosis: MDD (major depressive disorder)  Secondary Diagnoses: Principal Problem:   MDD (major depressive disorder)   Current Medications:  Current Facility-Administered Medications  Medication Dose Route Frequency Provider Last Rate Last Admin   acetaminophen (TYLENOL) tablet 650 mg  650 mg Oral Q6H PRN Motley-Mangrum, Jadeka A, PMHNP       alum & mag hydroxide-simeth (MAALOX/MYLANTA) 200-200-20 MG/5ML suspension 30 mL  30 mL Oral Q4H PRN Motley-Mangrum, Jadeka A, PMHNP       amLODipine (NORVASC) tablet 10 mg  10 mg Oral QHS Motley-Mangrum, Jadeka A, PMHNP   10 mg at 08/23/23 2137   Atogepant TABS 60 mg  60 mg Oral Daily Motley-Mangrum, Jadeka A, PMHNP       buPROPion (WELLBUTRIN SR) 12 hr tablet 150 mg  150 mg Oral BID Motley-Mangrum, Jadeka A, PMHNP   150 mg at 08/24/23 9562   haloperidol (HALDOL) tablet 5 mg  5 mg Oral TID PRN Motley-Mangrum, Jadeka A, PMHNP       And   diphenhydrAMINE (BENADRYL) capsule 50 mg  50 mg Oral TID PRN Motley-Mangrum, Jadeka A, PMHNP       haloperidol lactate (HALDOL) injection 10 mg  10 mg Intramuscular TID PRN Motley-Mangrum, Jadeka A, PMHNP       And   diphenhydrAMINE (BENADRYL) injection 50 mg  50 mg Intramuscular TID PRN Motley-Mangrum, Jadeka A, PMHNP       And   LORazepam (ATIVAN) injection 2 mg  2 mg Intramuscular TID PRN Motley-Mangrum, Jadeka A, PMHNP       hydrOXYzine (ATARAX) tablet 25 mg  25 mg Oral TID PRN Motley-Mangrum, Jadeka A, PMHNP   25 mg at 08/24/23 0924   labetalol (NORMODYNE) tablet 150 mg  150 mg Oral BID Motley-Mangrum, Jadeka A, PMHNP   150 mg at 08/24/23 0904   magnesium hydroxide (MILK OF MAGNESIA) suspension 30 mL  30 mL Oral Daily PRN Motley-Mangrum, Jadeka A, PMHNP       OLANZapine (ZYPREXA) injection 5 mg  5 mg Intramuscular TID PRN Motley-Mangrum, Jadeka A, PMHNP       polyethylene  glycol (MIRALAX / GLYCOLAX) packet 17 g  17 g Oral Daily Lewanda Rife, MD       sertraline (ZOLOFT) tablet 100 mg  100 mg Oral Daily Motley-Mangrum, Jadeka A, PMHNP   100 mg at 08/24/23 1308   spironolactone (ALDACTONE) tablet 25 mg  25 mg Oral Daily Motley-Mangrum, Jadeka A, PMHNP   25 mg at 08/24/23 0904   SUMAtriptan (IMITREX) tablet 25 mg  25 mg Oral Q8H PRN Lewanda Rife, MD       traMADol (ULTRAM) tablet 50 mg  50 mg Oral Q12H PRN Motley-Mangrum, Jadeka A, PMHNP   50 mg at 08/24/23 0924   traZODone (DESYREL) tablet 50 mg  50 mg Oral QHS PRN Motley-Mangrum, Jadeka A, PMHNP   50 mg at 08/23/23 2137   PTA Medications: Medications Prior to Admission  Medication Sig Dispense Refill Last Dose/Taking   amLODipine (NORVASC) 10 MG tablet Take 1 tablet (10 mg total) by mouth once nightly at bedtime. (Patient taking differently: Take 10 mg by mouth daily.) 90 tablet 1    Atogepant (QULIPTA) 60 MG TABS Take 1 tablet (60 mg total) by mouth daily. 30 tablet 11    buPROPion (WELLBUTRIN SR) 150 MG 12 hr tablet Take 1 tablet (150 mg total) by mouth 2 (  two) times daily. 180 tablet 1    conjugated estrogens (PREMARIN) vaginal cream Place 1 Applicatorful vaginally once a week. 42.5 g 12    famotidine (PEPCID) 20 MG tablet Take 1 tablet (20 mg total) by mouth daily as needed for heartburn or indigestion. 90 tablet 1    labetalol (NORMODYNE) 100 MG tablet Take 1.5 tablets (150 mg total) by mouth 2 (two) times daily. (Patient taking differently: Take 100 mg by mouth 2 (two) times daily.) 270 tablet 3    pantoprazole (PROTONIX) 20 MG tablet Take 1 tablet (20 mg total) by mouth daily. (Patient not taking: Reported on 08/16/2023) 90 tablet 0    polyethylene glycol powder (GLYCOLAX/MIRALAX) 17 GM/SCOOP powder Take 17 g by mouth daily as needed (for constipation). (Patient taking differently: Take 17 g by mouth daily.) 3350 g 1    PRESCRIPTION MEDICATION CPAP- At bedtime      sertraline (ZOLOFT) 100 MG tablet  Take 1 tablet (100 mg total) by mouth daily. 30 tablet 6    spironolactone (ALDACTONE) 25 MG tablet Take 1 tablet (25 mg total) by mouth daily. 90 tablet 1    traMADol (ULTRAM) 50 MG tablet Take 1 tablet (50 mg total) by mouth every 12 (twelve) hours as needed. (Patient not taking: Reported on 08/22/2023) 30 tablet 2    Ubrogepant (UBRELVY) 100 MG TABS Take 1 tablet by mouth daily as needed (Migraine).      Vitamin D, Ergocalciferol, (DRISDOL) 1.25 MG (50000 UNIT) CAPS capsule Take 1 capsule (50,000 Units total) by mouth once a week. (Patient not taking: Reported on 08/22/2023) 13 capsule 4     Patient Stressors: Occupational concerns   Traumatic event    Patient Strengths: Ability for insight  Capable of independent living  Motivation for treatment/growth   Treatment Modalities: Medication Management, Group therapy, Case management,  1 to 1 session with clinician, Psychoeducation, Recreational therapy.   Physician Treatment Plan for Primary Diagnosis: MDD (major depressive disorder) Long Term Goal(s):     Short Term Goals:    Medication Management: Evaluate patient's response, side effects, and tolerance of medication regimen.  Therapeutic Interventions: 1 to 1 sessions, Unit Group sessions and Medication administration.  Evaluation of Outcomes: Progressing  Physician Treatment Plan for Secondary Diagnosis: Principal Problem:   MDD (major depressive disorder)  Long Term Goal(s):     Short Term Goals:       Medication Management: Evaluate patient's response, side effects, and tolerance of medication regimen.  Therapeutic Interventions: 1 to 1 sessions, Unit Group sessions and Medication administration.  Evaluation of Outcomes: Progressing   RN Treatment Plan for Primary Diagnosis: MDD (major depressive disorder) Long Term Goal(s): Knowledge of disease and therapeutic regimen to maintain health will improve  Short Term Goals: Ability to remain free from injury will improve,  Ability to verbalize frustration and anger appropriately will improve, Ability to demonstrate self-control, Ability to participate in decision making will improve, Ability to verbalize feelings will improve, Ability to disclose and discuss suicidal ideas, Ability to identify and develop effective coping behaviors will improve, and Compliance with prescribed medications will improve  Medication Management: RN will administer medications as ordered by provider, will assess and evaluate patient's response and provide education to patient for prescribed medication. RN will report any adverse and/or side effects to prescribing provider.  Therapeutic Interventions: 1 on 1 counseling sessions, Psychoeducation, Medication administration, Evaluate responses to treatment, Monitor vital signs and CBGs as ordered, Perform/monitor CIWA, COWS, AIMS and Fall Risk screenings as  ordered, Perform wound care treatments as ordered.  Evaluation of Outcomes: Progressing   LCSW Treatment Plan for Primary Diagnosis: MDD (major depressive disorder) Long Term Goal(s): Safe transition to appropriate next level of care at discharge, Engage patient in therapeutic group addressing interpersonal concerns.  Short Term Goals: Engage patient in aftercare planning with referrals and resources, Increase social support, Increase ability to appropriately verbalize feelings, Increase emotional regulation, Facilitate acceptance of mental health diagnosis and concerns, Facilitate patient progression through stages of change regarding substance use diagnoses and concerns, Identify triggers associated with mental health/substance abuse issues, and Increase skills for wellness and recovery  Therapeutic Interventions: Assess for all discharge needs, 1 to 1 time with Social worker, Explore available resources and support systems, Assess for adequacy in community support network, Educate family and significant other(s) on suicide prevention,  Complete Psychosocial Assessment, Interpersonal group therapy.  Evaluation of Outcomes: Progressing   Progress in Treatment: Attending groups: Yes. and No. Participating in groups: Yes. and No. Taking medication as prescribed: Yes. and No. Toleration medication: Yes. Family/Significant other contact made: No, will contact:  CSW will contact if given permission  Patient understands diagnosis: Yes. Discussing patient identified problems/goals with staff: Yes. Medical problems stabilized or resolved: Yes. Denies suicidal/homicidal ideation: Yes. Issues/concerns per patient self-inventory: No. Other: None   New problem(s) identified: No, Describe:  None identified   New Short Term/Long Term Goal(s):elimination of symptoms of psychosis, medication management for mood stabilization; elimination of SI thoughts; development of comprehensive mental wellness plan.   Patient Goals:  "being here, it's like a little achievement, I finally am getting the help I need"   Discharge Plan or Barriers: CSW will assist with appropriate discharge planning  Reason for Continuation of Hospitalization: Depression Medication stabilization Suicidal ideation  Estimated Length of Stay:  1 to 7 days   Last 3 [place] Suicide Severity Risk Score: Flowsheet Row Admission (Current) from 08/23/2023 in [facility] BE[facility] Most recent reading at 08/23/2023  3:00 PM ED from 08/21/2023 in [facility] Emergency Department at [facility] Most recent reading at 08/22/2023  7:23 PM ED from 08/21/2023 in [facility] Most recent reading at 08/21/2023  6:04 PM  C-SSRS RISK CATEGORY High Risk Error: Q7 should not be populated when Q6 is No Error: Q7 should not be populated when Q6 is No       Last PHQ 2/9 Scores:    08/21/2023    6:04 PM 02/16/2023    2:59 PM 12/29/2022    3:14 PM  Depression screen PHQ 2/9  Decreased Interest 2 2 1   Down, Depressed, Hopeless 2 3 1    PHQ - 2 Score 4 5 2   Altered sleeping 2 2 2   Tired, decreased energy 2 3 2   Change in appetite 2 3 2   Feeling bad or failure about yourself  2 3 1   Trouble concentrating 2 3 2   Moving slowly or fidgety/restless 2 1 0  Suicidal thoughts 2 0 0  PHQ-9 Score 18 20 11   Difficult doing work/chores Extremely dIfficult Somewhat difficult     Scribe for Treatment Team: Elza Rafter, Theresia Majors 08/24/2023 11:24 AM

## 2023-08-24 NOTE — Progress Notes (Signed)
   08/23/23 2000  Psych Admission Type (Psych Patients Only)  Admission Status Voluntary  Psychosocial Assessment  Patient Complaints Anxiety;Depression  Eye Contact Fair  Facial Expression Flat  Affect Sullen  Speech Logical/coherent  Interaction Minimal  Motor Activity Unsteady  Appearance/Hygiene In scrubs  Behavior Characteristics Cooperative  Mood Depressed  Thought Process  Coherency WDL  Content WDL  Delusions None reported or observed  Perception WDL  Hallucination None reported or observed  Judgment Impaired  Confusion None  Danger to Self  Current suicidal ideation? Passive  Agreement Not to Harm Self Yes  Description of Agreement verbal  Danger to Others  Danger to Others None reported or observed   Patient alert and oriented. Flat affect. Mood sad and depression. States she's okay and feeling the same. Rates depression level 8/10. Pt said "I feel suicidal all the time everyday with racing thoughts. Contract for safety. Isolative. Did not attend group. Scheduled medications administered to patient, per MD orders. PRNs given for anxiety and insomnia. Support and encouragement provided.  Routine safety checks conducted every 15 minutes.  Patient informed to notify staff with problems or concerns. CPAP in place. No s/s of resp distress noted. No AV/H. No c/o pain/discomfort noted. Patient interacts well with others on the unit. Patient remains safe at this time.

## 2023-08-24 NOTE — Group Note (Signed)
 Recreation Therapy Group Note   Group Topic:Emotion Expression  Group Date: 08/24/2023 Start Time: 1500 End Time: 1600 Facilitators: Rosina Lowenstein, LRT, CTRS Location: Courtyard  Group Description: [employer]chant [employer]. Patients drew a laminated card out of a bag that had a word or phrase on it. Pt encouraged to speak about a time in their life or fond memory that specifically relates to the word they chose out of the bag. An example would be: "parenthood, meals, siblings, travel, or home".  LRT prompted following questions and encouraged contribution from peers to increase communication.   Goal Area(s) Addressed: Patient will increase verbal communication by conversing with peers. Patient will contribute to group discussion with minimal prompting. Patient will reminisce a positive memory or moment in their life.   Affect/Mood: N/A   Participation Level: Did not attend    Clinical Observations/Individualized Feedback: Patient did not attend group.   Plan: Continue to engage patient in RT group sessions 2-3x/week.   Rosina Lowenstein, LRT, CTRS 08/24/2023 5:36 PM

## 2023-08-24 NOTE — Group Note (Signed)
 Date:  08/24/2023 Time:  6:04 PM  Group Topic/Focus:  Orientation:   The focus of this group is to educate the patient on the purpose and policies of crisis stabilization and provide a format to answer questions about their admission.  The group details unit policies and expectations of patients while admitted. Rediscovering Joy:   The focus of this group is to explore various ways to relieve stress in a positive manner.    Participation Level:  Active  Participation Quality:  Appropriate and Sharing  Affect:  Appropriate  Cognitive:  Oriented  Insight: Appropriate  Engagement in Group:  Engaged and Supportive  Modes of Intervention:  Orientation, Socialization, and Support  Additional Comments:     Alexis Frock 08/24/2023, 6:04 PM

## 2023-08-24 NOTE — Plan of Care (Signed)

## 2023-08-24 NOTE — Plan of Care (Signed)
 D: Pt alert and oriented. Pt reports experiencing anxiety/depression at this time. Pt reports experiencing chronic lower back pain, prn medication given and found helpful. Pt denies experiencing any HI, or AVH at this time however, endorses SI w/o a plan. Pt contracts for safety and will notify staff if anything changes.   A: Scheduled medications administered to pt, per MD orders. Support and encouragement provided. Frequent verbal contact made. Routine safety checks conducted q15 minutes.   R: No adverse drug reactions noted. Pt verbally contracts for safety at this time. Pt compliant with medications. Pt interacts well but minimally with others on the unit. Pt remains safe at this time. Plan of care ongoing.  Problem: Education: Goal: Knowledge of General Education information will improve Description: Including pain rating scale, medication(s)/side effects and non-pharmacologic comfort measures Outcome: Progressing   Problem: Coping: Goal: Level of anxiety will decrease Outcome: Not Progressing

## 2023-08-24 NOTE — Group Note (Signed)
 Therapy Group Note  Group Topic: Self Care Safety with Equipment Group Date: 08/24/2023 Start Time: 1300 End Time: 1335 Facilitators: Lottie Mussel, OT     Group Description: Group educated on safety considerations/modifications with bathing, dressing and ADL routines to maximize independence and minimize fall risk with tasks.  Reviewed and demonstrated use of shower chair and tub transfer bench as appropriate.  Reviewed and demonstrated use of adaptive equipment (sock aide, reacher, long-handled sponge) available for ADL tasks.  Reviewed and demonstrated role of activity pacing and energy conservation with functional activities.  Provided handout with visual reference of available equipment.    Therapeutic Goal(s): Verbalize and demonstrate safe technique for tub/shower transfers with DME/adaptive equipment as needed. Verbalize and demonstrate appropriate use of adaptive equipment with bathing, dressing and ADL routine. Verbalize and demonstrate appropriate use of activity pacing and energy conservation with bathing, dressing and ADL routine.  Individual Participation:  Pt did not attend.    Participation Level: Did not attend   Participation Quality:   Behavior:   Speech/Thought Process:   Affect/Mood:   Insight:   Judgement:   Individualization:   Modes of Intervention:   Patient Response to Interventions:    Plan: Continue to engage patient in OT groups 1-2x/week.   Arman Filter., MPH, MS, OTR/L ascom [phone] 08/24/23, 2:32 PM
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
## 2023-08-25 DIAGNOSIS — F332 Major depressive disorder, recurrent severe without psychotic features: Secondary | ICD-10-CM | POA: Diagnosis not present

## 2023-08-25 NOTE — B[facility] Counselor (Signed)
 CSW attempted to complete assessment with pt, pt reports she wants to make a phone call first.   CSW will attempt to complete assessment at a later time   Reynaldo Minium, MSW, [facility] 08/25/2023 4:23 PM

## 2023-08-25 NOTE — Plan of Care (Signed)
 D: Pt alert and oriented. Pt reports experiencing anxiety/depression at this time. Pt reports experiencing chronic lower back pain and chronic migraines, prn medications given. Pt denies experiencing any SI/HI, or AVH at this time.   A: Scheduled medications administered to pt, per MD orders. Support and encouragement provided. Frequent verbal contact made. Routine safety checks conducted q15 minutes.   R: No adverse drug reactions noted. Pt verbally contracts for safety at this time. Pt compliant with medications and treatment plan. Pt interacts well but minimally with others on the unit. Pt remains safe at this time. Plan of care ongoing.  Problem: Nutrition: Goal: Adequate nutrition will be maintained Outcome: Progressing   Problem: Coping: Goal: Level of anxiety will decrease Outcome: Not Progressing

## 2023-08-25 NOTE — Progress Notes (Signed)
   08/25/23 0610  15 Minute Checks  Location Bedroom  Visual Appearance Calm  Behavior Sleeping  Sleep (Behavioral Health Patients Only)  Calculate sleep? (Click Yes once per 24 hr at 0600 safety check) Yes  Documented sleep last 24 hours 12.5

## 2023-08-25 NOTE — Progress Notes (Signed)
   08/24/23 2000  Psych Admission Type (Psych Patients Only)  Admission Status Voluntary  Psychosocial Assessment  Patient Complaints Anxiety;Depression  Eye Contact Fair  Facial Expression Flat  Affect Appropriate to circumstance  Speech Logical/coherent  Interaction Minimal  Motor Activity Unsteady  Appearance/Hygiene In scrubs  Behavior Characteristics Cooperative  Mood Depressed  Thought Process  Coherency WDL  Content WDL  Delusions None reported or observed  Perception WDL  Hallucination None reported or observed  Judgment Impaired  Confusion None  Danger to Self  Current suicidal ideation? Passive  Agreement Not to Harm Self Yes  Description of Agreement verbal   Pt is alert and oriented. Calm and cooperative. Endorses anxiety and depression 6/10. States she's feeling much better. Isolative. Did not attend group. Po med compliant. PRN given for insomnia. Tol well. Q 15 min checks maintained for safety. Denies SI/HI/AV/H. No c/o pain/discomfort noted.
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
## 2023-08-25 NOTE — Group Note (Signed)
 Date:  08/25/2023 Time:  8:35 PM  Group Topic/Focus:  Goals Group:   The focus of this group is to help patients establish daily goals to achieve during treatment and discuss how the patient can incorporate goal setting into their daily lives to aide in recovery.    Participation Level:  Active  Participation Quality:  Appropriate  Affect:  Appropriate  Cognitive:  Appropriate  Insight: Appropriate  Engagement in Group:  Engaged  Modes of Intervention:  Activity  Additional Comments:    Maximillion Gill 08/25/2023, 8:35 PM

## 2023-08-26 DIAGNOSIS — F332 Major depressive disorder, recurrent severe without psychotic features: Secondary | ICD-10-CM | POA: Diagnosis not present

## 2023-08-26 NOTE — Plan of Care (Signed)
 D: Pt alert and oriented. Pt reports experiencing anxiety/depression at this time. Pt reports experiencing chronic lower back pain, prn medication given. Pt denies experiencing any SI/HI, or AVH at this time.   A: Scheduled medications administered to pt, per MD orders. Support and encouragement provided. Frequent verbal contact made. Routine safety checks conducted q15 minutes.   R: No adverse drug reactions noted. Pt verbally contracts for safety at this time. Pt compliant with medications and treatment plan. Pt interacts well with others on the unit. Pt remains safe at this time. Plan of care ongoing.  Pt has been more visible in the milieu.   Problem: Education: Goal: Knowledge of General Education information will improve Description: Including pain rating scale, medication(s)/side effects and non-pharmacologic comfort measures Outcome: Progressing   Problem: Nutrition: Goal: Adequate nutrition will be maintained Outcome: Progressing
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
## 2023-08-26 NOTE — Progress Notes (Signed)
 Janice [name] is a 58 y.o. female patient. No diagnosis found. Past Medical History:  Diagnosis Date   Chest pain 10/04/2007   Esophageal reflux 08/02/2006   Essential hypertension, benign 08/02/2006   Generalized anxiety disorder    Hypertensive urgency 07/10/2022   Leukocytosis 05/26/2022   Major depressive disorder 05/31/2021   Migraine with aura and without status migrainosus, not intractable 05/31/2021   Morbid obesity with body mass index (BMI) of 40.0 to 44.9 in adult 12/13/2021   OSA (obstructive sleep apnea) 06/01/2022   NPSG 07/04/22- AHI 63.4/hr, desaturation to 66%/mean 89.7%, body      Pap smear abnormality of cervix/human papillomavirus (HPV) positive 11/06/2019   Perimenopausal 03/06/2019   Polycythemia, secondary 05/26/2022   Prediabetes 04/04/2019   Primary osteoarthritis of right knee 12/13/2021   Psoriasis 03/06/2019   Stage 3b chronic kidney disease 05/03/2022   Trichomonas infection 11/06/2019   Vitamin D deficiency 09/29/2015   Current Facility-Administered Medications  Medication Dose Route Frequency Provider Last Rate Last Admin   acetaminophen (TYLENOL) tablet 650 mg  650 mg Oral Q6H PRN Motley-Mangrum, Jadeka A, PMHNP       alum & mag hydroxide-simeth (MAALOX/MYLANTA) 200-200-20 MG/5ML suspension 30 mL  30 mL Oral Q4H PRN Motley-Mangrum, Jadeka A, PMHNP       amLODipine (NORVASC) tablet 10 mg  10 mg Oral QHS Motley-Mangrum, Jadeka A, PMHNP   10 mg at 08/25/23 2130   Atogepant TABS 60 mg  60 mg Oral Daily Motley-Mangrum, Jadeka A, PMHNP       buPROPion (WELLBUTRIN SR) 12 hr tablet 150 mg  150 mg Oral BID Motley-Mangrum, Jadeka A, PMHNP   150 mg at 08/25/23 2131   haloperidol (HALDOL) tablet 5 mg  5 mg Oral TID PRN Motley-Mangrum, Jadeka A, PMHNP       And   diphenhydrAMINE (BENADRYL) capsule 50 mg  50 mg Oral TID PRN Motley-Mangrum, Jadeka A, PMHNP       haloperidol lactate (HALDOL) injection 10 mg  10 mg Intramuscular TID PRN Motley-Mangrum, Jadeka A,  PMHNP       And   diphenhydrAMINE (BENADRYL) injection 50 mg  50 mg Intramuscular TID PRN Motley-Mangrum, Jadeka A, PMHNP       And   LORazepam (ATIVAN) injection 2 mg  2 mg Intramuscular TID PRN Motley-Mangrum, Jadeka A, PMHNP       hydrOXYzine (ATARAX) tablet 25 mg  25 mg Oral TID PRN Motley-Mangrum, Jadeka A, PMHNP   25 mg at 08/25/23 0925   labetalol (NORMODYNE) tablet 150 mg  150 mg Oral BID Motley-Mangrum, Jadeka A, PMHNP   150 mg at 08/25/23 2131   magnesium hydroxide (MILK OF MAGNESIA) suspension 30 mL  30 mL Oral Daily PRN Motley-Mangrum, Jadeka A, PMHNP       OLANZapine (ZYPREXA) injection 5 mg  5 mg Intramuscular TID PRN Motley-Mangrum, Jadeka A, PMHNP       polyethylene glycol (MIRALAX / GLYCOLAX) packet 17 g  17 g Oral Daily Lewanda Rife, MD   17 g at 08/25/23 0926   sertraline (ZOLOFT) tablet 100 mg  100 mg Oral Daily Motley-Mangrum, Jadeka A, PMHNP   100 mg at 08/25/23 1478   spironolactone (ALDACTONE) tablet 25 mg  25 mg Oral Daily Motley-Mangrum, Jadeka A, PMHNP   25 mg at 08/25/23 0926   SUMAtriptan (IMITREX) tablet 25 mg  25 mg Oral Q8H PRN Lewanda Rife, MD   25 mg at 08/25/23 0926   traMADol (ULTRAM) tablet 50 mg  50  mg Oral Q12H PRN Motley-Mangrum, Ezra Sites, PMHNP   50 mg at 08/25/23 2145   traZODone (DESYREL) tablet 50 mg  50 mg Oral QHS PRN Motley-Mangrum, Jadeka A, PMHNP   50 mg at 08/24/23 2130   Allergies  Allergen Reactions   Losartan Other (See Comments)    Chest pain   Neomycin Sulfate Swelling, Rash and Other (See Comments)    Pain in ear, swelling and redness   Principal Problem:   MDD (major depressive disorder)  Blood pressure (!) 136/90, pulse 72, temperature (!) 97.5 F (36.4 C), resp. rate 16, height 5' (1.524 m), weight 98.4 kg, last menstrual period 07/20/2016, SpO2 99%.  Subjective Objective: Vital signs: (most recent): Blood pressure (!) 136/90, pulse 72, temperature (!) 97.5 F (36.4 C), resp. rate 16, height 5' (1.524 m), weight 98.4  kg, last menstrual period 07/20/2016, SpO2 99%.    Assessment & Plan Continue to monitor pt. Pt denies SI/HI, small  brosett risk this shift. PRN pain med given and pt. Slept over night.  Sheleen Conchas B Naria Abbey 08/26/2023
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
## 2023-08-26 NOTE — Plan of Care (Signed)
 Pt. Took medication, communicated . Need pain medication for pain.

## 2023-08-26 NOTE — Group Note (Signed)
 Date:  08/26/2023 Time:  10:00 PM  Group Topic/Focus:  Wrap-Up Group:   The focus of this group is to help patients review their daily goal of treatment and discuss progress on daily workbooks.    Participation Level:  Active  Participation Quality:  Appropriate  Affect:  Appropriate  Cognitive:  Alert  Insight: Appropriate  Engagement in Group:  Engaged  Modes of Intervention:  Discussion  Additional Comments:    Maeola Harman 08/26/2023, 10:00 PM

## 2023-08-27 DIAGNOSIS — F331 Major depressive disorder, recurrent, moderate: Secondary | ICD-10-CM | POA: Diagnosis not present

## 2023-08-27 NOTE — Group Note (Signed)
 Recreation Therapy Group Note   Group Topic:Coping Skills  Group Date: 08/27/2023 Start Time: 1400 End Time: 1450 Facilitators: Rosina Lowenstein, LRT, CTRS Location:  Dayroom  Group Description: Mind Map.  Patient was provided a blank template of a diagram with 32 blank boxes in a tiered system, branching from the center (similar to a bubble chart). LRT directed patients to label the middle of the diagram "Coping Skills". LRT and patients then came up with 8 different coping skills as examples. Pt were directed to record their coping skills in the 2nd tier boxes closest to the center.  Patients would then share their coping skills with the group as LRT wrote them out. LRT gave a handout of 99 different coping skills at the end of group.   Goal Area(s) Addressed: Patients will be able to define "coping skills". Patient will identify new coping skills.  Patient will increase communication.   Affect/Mood: N/A   Participation Level: Did not attend    Clinical Observations/Individualized Feedback: Patient did not attend group.   Plan: Continue to engage patient in RT group sessions 2-3x/week.   Rosina Lowenstein, LRT, CTRS 08/27/2023 4:52 PM

## 2023-08-27 NOTE — Plan of Care (Signed)

## 2023-08-27 NOTE — Progress Notes (Signed)
   08/27/23 0830  Psych Admission Type (Psych Patients Only)  Admission Status Voluntary  Psychosocial Assessment  Patient Complaints Anxiety  Eye Contact Fair  Facial Expression Flat  Affect Appropriate to circumstance  Speech Logical/coherent  Interaction Minimal  Motor Activity Unsteady  Appearance/Hygiene In scrubs  Behavior Characteristics Cooperative  Mood Depressed  Thought Process  Coherency WDL  Content WDL  Delusions None reported or observed  Perception WDL  Hallucination None reported or observed  Judgment Impaired  Danger to Self  Current suicidal ideation? Denies

## 2023-08-27 NOTE — Progress Notes (Signed)
   08/27/23 2000  Psych Admission Type (Psych Patients Only)  Admission Status Voluntary  Psychosocial Assessment  Eye Contact Fair  Facial Expression Flat  Affect Appropriate to circumstance  Speech Logical/coherent  Interaction Minimal  Motor Activity Unsteady  Appearance/Hygiene In scrubs  Behavior Characteristics Cooperative;Appropriate to situation  Mood Depressed  Thought Process  Coherency WDL  Content WDL  Delusions None reported or observed  Perception WDL  Hallucination None reported or observed  Judgment Impaired  Confusion None  Danger to Self  Current suicidal ideation? Denies  Agreement Not to Harm Self Yes  Description of Agreement verbal  Danger to Others  Danger to Others None reported or observed

## 2023-08-27 NOTE — Progress Notes (Signed)
   08/27/23 0000  Psych Admission Type (Psych Patients Only)  Admission Status Voluntary  Psychosocial Assessment  Patient Complaints Anxiety;Depression  Eye Contact Fair  Facial Expression Flat  Affect Appropriate to circumstance  Speech Logical/coherent  Interaction Minimal  Motor Activity Unsteady  Appearance/Hygiene In scrubs  Behavior Characteristics Cooperative;Appropriate to situation  Mood Depressed  Thought Process  Coherency WDL  Content WDL  Delusions None reported or observed  Perception WDL  Hallucination None reported or observed  Judgment Impaired  Confusion None  Danger to Self  Current suicidal ideation? Denies  Agreement Not to Harm Self Yes  Description of Agreement verbal  Danger to Others  Danger to Others None reported or observed

## 2023-08-27 NOTE — Progress Notes (Signed)
 [facility] MD Progress Note  08/27/2023 5:14 PM Janice [name]  MRN:  [id number] Janice [name] is a 58 y.o. female.With a history of depression anxiety and suicidal ideation who presents to the ED for suicidal ideation. Patient was seen at behavioral health urgent care where she voiced increased depression and suicidality. There was a plan for inpatient psychiatric hospitalization however she does sleep with a CPAP at night and behavioral health urgent care was unable to accommodate this medical needs so she was transferred to the ED in order to sleep with her CPAP with plan for ongoing bed search. Denies drug and alcohol use.   Subjective:  Chart reviewed, case discussed in multidisciplinary meeting, patient seen during rounds.  Patient is noted to be resting in her bed.  She offers no complaints.  She reports that she is doing a lot better since her admission and her mood has improved.  She talks about some of her stressors.  She remains future oriented and wants to be there for her family.  She talks about her daughter giving birth to premature baby who is struggling to survive in the hospital and that her daughter was in an accident and currently unable to walk.  She denies SI/HI/intent/plan.  She denies auditory/visual hallucinations.  She is requesting to go home.  Provider called her daughter Ms. Janice [name] [phone] and daughter reports that she has been in touch with her mom and she feels that her mom is doing a lot better and she is ready for her mom to come home.  She wanted to know what time tomorrow she can pick her up mom or arrange transportation for her..  Provider educated the family that social worker will give them a call and work on the transportation with them.   Sleep: Fair  Appetite:  Fair  Past Psychiatric History: see h&P Family History:  Family History  Problem Relation Age of Onset   Hypertension Mother    Kidney disease Mother    Pancreatic cancer Father    Alcohol abuse  Father    Depression Father    Drug abuse Father    Hypertension Father    Hypertension Maternal Grandmother    Stroke Maternal Grandmother    Dementia Maternal Grandmother    Alzheimer's disease Maternal Grandfather    Hypertension Daughter    Colon cancer Neg Hx    Esophageal cancer Neg Hx    Stomach cancer Neg Hx    Rectal cancer Neg Hx    Social History:  Social History   Substance and Sexual Activity  Alcohol Use Not Currently   Comment: occ     Social History   Substance and Sexual Activity  Drug Use No    Social History   Socioeconomic History   Marital status: Single    Spouse name: Not on file   Number of children: 3   Years of education: 10   Highest education level: 10th grade  Occupational History   Occupation: Unemployed  Tobacco Use   Smoking status: Former    Types: Cigarettes    Passive exposure: Past   Smokeless tobacco: Never   Tobacco comments:    Pt states she quit smoking 9 months ago. AB, CMA 06-25-2023  Vaping Use   Vaping status: Never Used  Substance and Sexual Activity   Alcohol use: Not Currently    Comment: occ   Drug use: No   Sexual activity: Yes    Partners: Male  Birth control/protection: Condom    Comment: single  Other Topics Concern   Not on file  Social History Narrative   Right Handed    Social Drivers of Health   Financial Resource Strain: Low Risk  (09/29/2022)   Overall Financial Resource Strain (CARDIA)    Difficulty of Paying Living Expenses: Not very hard  Food Insecurity: No Food Insecurity (08/23/2023)   Hunger Vital Sign    Worried About Running Out of Food in the Last Year: Never true    Ran Out of Food in the Last Year: Never true  Transportation Needs: No Transportation Needs (08/23/2023)   PRAPARE - Administrator, Civil Service (Medical): No    Lack of Transportation (Non-Medical): No  Physical Activity: Insufficiently Active (09/29/2022)   Exercise Vital Sign    Days of Exercise per  Week: 2 days    Minutes of Exercise per Session: 40 min  Stress: No Stress Concern Present (09/29/2022)   [employer] of Occupational Health - Occupational Stress Questionnaire    Feeling of Stress : Only a little  Social Connections: Unknown (09/29/2022)   Social Connection and Isolation Panel [NHANES]    Frequency of Communication with Friends and Family: More than three times a week    Frequency of Social Gatherings with Friends and Family: More than three times a week    Attends Religious Services: 1 to 4 times per year    Active Member of [employer] or Organizations: No    Attends Banker Meetings: Never    Marital Status: Patient declined   Past Medical History:  Past Medical History:  Diagnosis Date   Chest pain 10/04/2007   Esophageal reflux 08/02/2006   Essential hypertension, benign 08/02/2006   Generalized anxiety disorder    Hypertensive urgency 07/10/2022   Leukocytosis 05/26/2022   Major depressive disorder 05/31/2021   Migraine with aura and without status migrainosus, not intractable 05/31/2021   Morbid obesity with body mass index (BMI) of 40.0 to 44.9 in adult 12/13/2021   OSA (obstructive sleep apnea) 06/01/2022   NPSG 07/04/22- AHI 63.4/hr, desaturation to 66%/mean 89.7%, body      Pap smear abnormality of cervix/human papillomavirus (HPV) positive 11/06/2019   Perimenopausal 03/06/2019   Polycythemia, secondary 05/26/2022   Prediabetes 04/04/2019   Primary osteoarthritis of right knee 12/13/2021   Psoriasis 03/06/2019   Stage 3b chronic kidney disease 05/03/2022   Trichomonas infection 11/06/2019   Vitamin D deficiency 09/29/2015    Past Surgical History:  Procedure Laterality Date   CESAREAN SECTION     x2    Current Medications: Current Facility-Administered Medications  Medication Dose Route Frequency Provider Last Rate Last Admin   acetaminophen (TYLENOL) tablet 650 mg  650 mg Oral Q6H PRN Motley-Mangrum, Jadeka A, PMHNP       alum &  mag hydroxide-simeth (MAALOX/MYLANTA) 200-200-20 MG/5ML suspension 30 mL  30 mL Oral Q4H PRN Motley-Mangrum, Jadeka A, PMHNP       amLODipine (NORVASC) tablet 10 mg  10 mg Oral QHS Motley-Mangrum, Jadeka A, PMHNP   10 mg at 08/26/23 2122   Atogepant TABS 60 mg  60 mg Oral Daily Motley-Mangrum, Jadeka A, PMHNP       buPROPion (WELLBUTRIN SR) 12 hr tablet 150 mg  150 mg Oral BID Motley-Mangrum, Jadeka A, PMHNP   150 mg at 08/27/23 1007   haloperidol (HALDOL) tablet 5 mg  5 mg Oral TID PRN Motley-Mangrum, Ezra Sites, PMHNP  And   diphenhydrAMINE (BENADRYL) capsule 50 mg  50 mg Oral TID PRN Motley-Mangrum, Jadeka A, PMHNP       haloperidol lactate (HALDOL) injection 10 mg  10 mg Intramuscular TID PRN Motley-Mangrum, Jadeka A, PMHNP       And   diphenhydrAMINE (BENADRYL) injection 50 mg  50 mg Intramuscular TID PRN Motley-Mangrum, Jadeka A, PMHNP       And   LORazepam (ATIVAN) injection 2 mg  2 mg Intramuscular TID PRN Motley-Mangrum, Geralynn Ochs A, PMHNP       hydrOXYzine (ATARAX) tablet 25 mg  25 mg Oral TID PRN Motley-Mangrum, Jadeka A, PMHNP   25 mg at 08/27/23 1006   labetalol (NORMODYNE) tablet 150 mg  150 mg Oral BID Motley-Mangrum, Jadeka A, PMHNP   150 mg at 08/27/23 1007   magnesium hydroxide (MILK OF MAGNESIA) suspension 30 mL  30 mL Oral Daily PRN Motley-Mangrum, Jadeka A, PMHNP       OLANZapine (ZYPREXA) injection 5 mg  5 mg Intramuscular TID PRN Motley-Mangrum, Jadeka A, PMHNP       polyethylene glycol (MIRALAX / GLYCOLAX) packet 17 g  17 g Oral Daily Lewanda Rife, MD   17 g at 08/27/23 1003   sertraline (ZOLOFT) tablet 100 mg  100 mg Oral Daily Motley-Mangrum, Jadeka A, PMHNP   100 mg at 08/27/23 1006   spironolactone (ALDACTONE) tablet 25 mg  25 mg Oral Daily Motley-Mangrum, Jadeka A, PMHNP   25 mg at 08/27/23 1006   SUMAtriptan (IMITREX) tablet 25 mg  25 mg Oral Q8H PRN Lewanda Rife, MD   25 mg at 08/27/23 1006   traMADol (ULTRAM) tablet 50 mg  50 mg Oral Q12H PRN  Motley-Mangrum, Jadeka A, PMHNP   50 mg at 08/27/23 1006   traZODone (DESYREL) tablet 50 mg  50 mg Oral QHS PRN Motley-Mangrum, Jadeka A, PMHNP   50 mg at 08/26/23 2123    Lab Results: No results found for this or any previous visit (from the past 48 hours).  Blood Alcohol level:  Lab Results  Component Value Date   ETH <10 08/21/2023    Metabolic Disorder Labs: Lab Results  Component Value Date   HGBA1C 5.7 (H) 11/14/2022   MPG 120 08/24/2016   MPG 123 09/27/2015   No results found for: "PROLACTIN" Lab Results  Component Value Date   CHOL 181 05/01/2022   TRIG 141 05/01/2022   HDL 50 05/01/2022   CHOLHDL 3.6 05/01/2022   VLDL 22 08/24/2016   LDLCALC 106 (H) 05/01/2022   LDLCALC 102 (H) 05/31/2021    Physical Findings: AIMS:  , ,  ,  ,    CIWA:    COWS:      Psychiatric Specialty Exam:  Presentation  General Appearance:  Appropriate for Environment; Casual  Eye Contact: Fair  Speech: Clear and Coherent  Speech Volume: Normal    Mood and Affect  Mood: Depressed  Affect: Depressed   Thought Process  Thought Processes: Coherent  Descriptions of Associations:Intact  Orientation:Full (Time, Place and Person)  Thought Content:Logical  Hallucinations:Hallucinations: None  Ideas of Reference:None  Suicidal Thoughts:Suicidal Thoughts: No  Homicidal Thoughts:Homicidal Thoughts: No   Sensorium  Memory: Immediate Fair; Recent Fair; Remote Fair  Judgment: Impaired  Insight: Shallow   Executive Functions  Concentration: Fair  Attention Span: Fair  Recall: [employer] of Knowledge: Fair  Language: Fair   Psychomotor Activity  Psychomotor Activity: Psychomotor Activity: Normal  Musculoskeletal: Strength & Muscle Tone: within normal limits  Gait & Station: normal Assets  Assets: Manufacturing systems engineer; Desire for Improvement; Resilience; Physical Health    Physical Exam: Physical Exam Vitals and nursing note  reviewed.  HENT:     Head: Normocephalic.     Right Ear: Tympanic membrane normal.     Nose: Nose normal.     Mouth/Throat:     Mouth: Mucous membranes are moist.  Eyes:     Pupils: Pupils are equal, round, and reactive to light.  Cardiovascular:     Rate and Rhythm: Normal rate.     Pulses: Normal pulses.  Pulmonary:     Breath sounds: Normal breath sounds.  Abdominal:     General: Bowel sounds are normal.  Musculoskeletal:        General: Normal range of motion.  Skin:    General: Skin is warm.  Neurological:     General: No focal deficit present.     Mental Status: She is alert.    Review of Systems  Constitutional: Negative.   HENT: Negative.    Eyes: Negative.   Cardiovascular: Negative.   Gastrointestinal: Negative.   Skin: Negative.   Neurological: Negative.    Blood pressure 122/83, pulse 75, temperature 98.5 F (36.9 C), temperature source Oral, resp. rate 16, height 5' (1.524 m), weight 98.4 kg, last menstrual period 07/20/2016, SpO2 98%. Body mass index is 42.38 kg/m.  Diagnosis: Principal Problem:   MDD (major depressive disorder)   PLAN: Safety and Monitoring:  -- Voluntary admission to inpatient psychiatric unit for safety, stabilization and treatment  -- Daily contact with patient to assess and evaluate symptoms and progress in treatment  -- Patient's case to be discussed in multi-disciplinary team meeting  -- Observation Level : q15 minute checks  -- Vital signs:  q12 hours  -- Precautions: suicide, elopement, and assault -- Encouraged patient to participate in unit milieu and in scheduled group therapies  2. Psychiatric Diagnoses and Treatment:      Wellbutrin 150 mg by mouth twice daily for depression, Zoloft 100 mg by mouth daily for depression    3. Medical Issues Being Addressed:   labetalol 150 mg by mouth twice daily, spironolactone 25 mg by mouth daily, Atogepant  60 mg by mouth daily, and Norvasc 10 mg by mouth daily   4. Discharge  Planning:   -- Social work and case management to assist with discharge planning and identification of hospital follow-up needs prior to discharge  -- Estimated LOS: 3-4 days  Verner Chol, MD 08/27/2023, 5:14 PM

## 2023-08-27 NOTE — Group Note (Signed)
 Date:  08/27/2023 Time:  9:05 PM  Group Topic/Focus:  Wrap-Up Group:   The focus of this group is to help patients review their daily goal of treatment and discuss progress on daily workbooks.    Participation Level:  Active  Participation Quality:  Appropriate  Affect:  Tearful  Cognitive:  Appropriate  Insight: Appropriate  Engagement in Group:  Engaged  Modes of Intervention:  Discussion  Additional Comments:    Kayah Hecker K Harriet Sutphen 08/27/2023, 9:05 PM

## 2023-08-28 DIAGNOSIS — F331 Major depressive disorder, recurrent, moderate: Secondary | ICD-10-CM | POA: Diagnosis not present

## 2023-08-28 NOTE — Progress Notes (Signed)
   08/28/23 1023  Psych Admission Type (Psych Patients Only)  Admission Status Voluntary  Psychosocial Assessment  Patient Complaints Anxiety;Sadness  Eye Contact Brief  Facial Expression Sad  Affect Sad  Speech Logical/coherent  Interaction Minimal  Motor Activity Slow  Appearance/Hygiene In scrubs  Behavior Characteristics Cooperative  Mood Sad  Thought Process  Coherency WDL  Content WDL  Delusions None reported or observed  Perception WDL  Hallucination None reported or observed  Judgment Impaired  Confusion None  Danger to Self  Current suicidal ideation? Denies  Agreement Not to Harm Self Yes  Description of Agreement verbal  Danger to Others  Danger to Others None reported or observed

## 2023-08-28 NOTE — Progress Notes (Signed)
   08/28/23 2000  Psych Admission Type (Psych Patients Only)  Admission Status Voluntary  Psychosocial Assessment  Patient Complaints Anxiety  Eye Contact Brief  Facial Expression Sad  Affect Sad  Speech Logical/coherent  Interaction Minimal  Motor Activity Slow;Unsteady  Appearance/Hygiene In scrubs  Behavior Characteristics Cooperative  Mood Depressed  Thought Process  Coherency WDL  Content WDL  Delusions None reported or observed  Perception WDL  Hallucination None reported or observed  Judgment Impaired  Confusion None  Danger to Self  Current suicidal ideation? Denies  Agreement Not to Harm Self Yes  Description of Agreement verbal  Danger to Others  Danger to Others None reported or observed

## 2023-08-28 NOTE — Plan of Care (Signed)

## 2023-08-28 NOTE — Progress Notes (Signed)
 [facility] MD Progress Note  08/28/2023 1:56 PM Janice [name]  MRN:  [id number] Janice [name] is a 58 y.o. female.With a history of depression anxiety and suicidal ideation who presents to the ED for suicidal ideation. Patient was seen at behavioral health urgent care where she voiced increased depression and suicidality. There was a plan for inpatient psychiatric hospitalization however she does sleep with a CPAP at night and behavioral health urgent care was unable to accommodate this medical needs so she was transferred to the ED in order to sleep with her CPAP with plan for ongoing bed search. Denies drug and alcohol use.   Subjective:  Chart reviewed, case discussed in multidisciplinary meeting, patient seen during rounds.  Patient is noted to be sitting in her room.  She reports that she got upset yesterday when she heard her grandson's uncle passed away.  She informed the provider that her grandson has lost his mother recently and uncle was taking care of him.  She reports that she is reading Bible and is staying strong for her family.  She denies suicidal/homicidal ideation/plan.  She reports that she will work with her family, go to her outpatient mental health appointments and stay safe.  She denies auditory/visual hallucinations.  She reports religion being her good support system.  She is taking her medications with no reported side effects.   Sleep: Fair  Appetite:  Fair  Past Psychiatric History: see h&P Family History:  Family History  Problem Relation Age of Onset   Hypertension Mother    Kidney disease Mother    Pancreatic cancer Father    Alcohol abuse Father    Depression Father    Drug abuse Father    Hypertension Father    Hypertension Maternal Grandmother    Stroke Maternal Grandmother    Dementia Maternal Grandmother    Alzheimer's disease Maternal Grandfather    Hypertension Daughter    Colon cancer Neg Hx    Esophageal cancer Neg Hx    Stomach cancer Neg Hx     Rectal cancer Neg Hx    Social History:  Social History   Substance and Sexual Activity  Alcohol Use Not Currently   Comment: occ     Social History   Substance and Sexual Activity  Drug Use No    Social History   Socioeconomic History   Marital status: Single    Spouse name: Not on file   Number of children: 3   Years of education: 10   Highest education level: 10th grade  Occupational History   Occupation: Unemployed  Tobacco Use   Smoking status: Former    Types: Cigarettes    Passive exposure: Past   Smokeless tobacco: Never   Tobacco comments:    Pt states she quit smoking 9 months ago. AB, CMA 06-25-2023  Vaping Use   Vaping status: Never Used  Substance and Sexual Activity   Alcohol use: Not Currently    Comment: occ   Drug use: No   Sexual activity: Yes    Partners: Male    Birth control/protection: Condom    Comment: single  Other Topics Concern   Not on file  Social History Narrative   Right Handed    Social Drivers of Health   Financial Resource Strain: Low Risk  (09/29/2022)   Overall Financial Resource Strain (CARDIA)    Difficulty of Paying Living Expenses: Not very hard  Food Insecurity: No Food Insecurity (08/23/2023)   Hunger Vital Sign  Worried About Programme researcher, broadcasting/film/video in the Last Year: Never true    Ran Out of Food in the Last Year: Never true  Transportation Needs: No Transportation Needs (08/23/2023)   PRAPARE - Administrator, Civil Service (Medical): No    Lack of Transportation (Non-Medical): No  Physical Activity: Insufficiently Active (09/29/2022)   Exercise Vital Sign    Days of Exercise per Week: 2 days    Minutes of Exercise per Session: 40 min  Stress: No Stress Concern Present (09/29/2022)   [employer] of Occupational Health - Occupational Stress Questionnaire    Feeling of Stress : Only a little  Social Connections: Unknown (09/29/2022)   Social Connection and Isolation Panel [NHANES]    Frequency of  Communication with Friends and Family: More than three times a week    Frequency of Social Gatherings with Friends and Family: More than three times a week    Attends Religious Services: 1 to 4 times per year    Active Member of [employer] or Organizations: No    Attends Banker Meetings: Never    Marital Status: Patient declined   Past Medical History:  Past Medical History:  Diagnosis Date   Chest pain 10/04/2007   Esophageal reflux 08/02/2006   Essential hypertension, benign 08/02/2006   Generalized anxiety disorder    Hypertensive urgency 07/10/2022   Leukocytosis 05/26/2022   Major depressive disorder 05/31/2021   Migraine with aura and without status migrainosus, not intractable 05/31/2021   Morbid obesity with body mass index (BMI) of 40.0 to 44.9 in adult 12/13/2021   OSA (obstructive sleep apnea) 06/01/2022   NPSG 07/04/22- AHI 63.4/hr, desaturation to 66%/mean 89.7%, body      Pap smear abnormality of cervix/human papillomavirus (HPV) positive 11/06/2019   Perimenopausal 03/06/2019   Polycythemia, secondary 05/26/2022   Prediabetes 04/04/2019   Primary osteoarthritis of right knee 12/13/2021   Psoriasis 03/06/2019   Stage 3b chronic kidney disease 05/03/2022   Trichomonas infection 11/06/2019   Vitamin D deficiency 09/29/2015    Past Surgical History:  Procedure Laterality Date   CESAREAN SECTION     x2    Current Medications: Current Facility-Administered Medications  Medication Dose Route Frequency Provider Last Rate Last Admin   acetaminophen (TYLENOL) tablet 650 mg  650 mg Oral Q6H PRN Motley-Mangrum, Jadeka A, PMHNP       alum & mag hydroxide-simeth (MAALOX/MYLANTA) 200-200-20 MG/5ML suspension 30 mL  30 mL Oral Q4H PRN Motley-Mangrum, Jadeka A, PMHNP       amLODipine (NORVASC) tablet 10 mg  10 mg Oral QHS Motley-Mangrum, Jadeka A, PMHNP   10 mg at 08/27/23 2133   Atogepant TABS 60 mg  60 mg Oral Daily Motley-Mangrum, Jadeka A, PMHNP       buPROPion  (WELLBUTRIN SR) 12 hr tablet 150 mg  150 mg Oral BID Motley-Mangrum, Jadeka A, PMHNP   150 mg at 08/28/23 0933   haloperidol (HALDOL) tablet 5 mg  5 mg Oral TID PRN Motley-Mangrum, Jadeka A, PMHNP       And   diphenhydrAMINE (BENADRYL) capsule 50 mg  50 mg Oral TID PRN Motley-Mangrum, Jadeka A, PMHNP       haloperidol lactate (HALDOL) injection 10 mg  10 mg Intramuscular TID PRN Motley-Mangrum, Jadeka A, PMHNP       And   diphenhydrAMINE (BENADRYL) injection 50 mg  50 mg Intramuscular TID PRN Motley-Mangrum, Geralynn Ochs A, PMHNP       And   LORazepam (  ATIVAN) injection 2 mg  2 mg Intramuscular TID PRN Motley-Mangrum, Geralynn Ochs A, PMHNP       hydrOXYzine (ATARAX) tablet 25 mg  25 mg Oral TID PRN Motley-Mangrum, Jadeka A, PMHNP   25 mg at 08/27/23 2133   labetalol (NORMODYNE) tablet 150 mg  150 mg Oral BID Motley-Mangrum, Jadeka A, PMHNP   150 mg at 08/28/23 0933   magnesium hydroxide (MILK OF MAGNESIA) suspension 30 mL  30 mL Oral Daily PRN Motley-Mangrum, Jadeka A, PMHNP       OLANZapine (ZYPREXA) injection 5 mg  5 mg Intramuscular TID PRN Motley-Mangrum, Jadeka A, PMHNP       polyethylene glycol (MIRALAX / GLYCOLAX) packet 17 g  17 g Oral Daily Lewanda Rife, MD   17 g at 08/27/23 1003   sertraline (ZOLOFT) tablet 100 mg  100 mg Oral Daily Motley-Mangrum, Jadeka A, PMHNP   100 mg at 08/28/23 1610   spironolactone (ALDACTONE) tablet 25 mg  25 mg Oral Daily Motley-Mangrum, Jadeka A, PMHNP   25 mg at 08/28/23 0933   SUMAtriptan (IMITREX) tablet 25 mg  25 mg Oral Q8H PRN Lewanda Rife, MD   25 mg at 08/27/23 1006   traMADol (ULTRAM) tablet 50 mg  50 mg Oral Q12H PRN Motley-Mangrum, Jadeka A, PMHNP   50 mg at 08/27/23 1006   traZODone (DESYREL) tablet 50 mg  50 mg Oral QHS PRN Motley-Mangrum, Jadeka A, PMHNP   50 mg at 08/27/23 2133    Lab Results: No results found for this or any previous visit (from the past 48 hours).  Blood Alcohol level:  Lab Results  Component Value Date   ETH <10  08/21/2023    Metabolic Disorder Labs: Lab Results  Component Value Date   HGBA1C 5.7 (H) 11/14/2022   MPG 120 08/24/2016   MPG 123 09/27/2015   No results found for: "PROLACTIN" Lab Results  Component Value Date   CHOL 181 05/01/2022   TRIG 141 05/01/2022   HDL 50 05/01/2022   CHOLHDL 3.6 05/01/2022   VLDL 22 08/24/2016   LDLCALC 106 (H) 05/01/2022   LDLCALC 102 (H) 05/31/2021    Physical Findings: AIMS:  , ,  ,  ,    CIWA:    COWS:      Psychiatric Specialty Exam:  Presentation  General Appearance:  Appropriate for Environment; Casual  Eye Contact: Fair  Speech: Clear and Coherent  Speech Volume: Decreased    Mood and Affect  Mood: Depressed  Affect: Depressed   Thought Process  Thought Processes: Coherent  Descriptions of Associations:Intact  Orientation:Full (Time, Place and Person)  Thought Content:Logical  Hallucinations:Hallucinations: None  Ideas of Reference:None  Suicidal Thoughts:Suicidal Thoughts: No  Homicidal Thoughts:Homicidal Thoughts: No   Sensorium  Memory: Immediate Fair; Recent Fair; Remote Fair  Judgment: Impaired  Insight: Shallow   Executive Functions  Concentration: Fair  Attention Span: Fair  Recall: [employer] of Knowledge: Fair  Language: Fair   Psychomotor Activity  Psychomotor Activity: Psychomotor Activity: Normal  Musculoskeletal: Strength & Muscle Tone: within normal limits Gait & Station: normal Assets  Assets: Manufacturing systems engineer; Desire for Improvement; Resilience    Physical Exam: Physical Exam Vitals and nursing note reviewed.  HENT:     Head: Normocephalic.     Right Ear: Tympanic membrane normal.     Nose: Nose normal.     Mouth/Throat:     Mouth: Mucous membranes are moist.  Eyes:     Pupils: Pupils are equal, round,  and reactive to light.  Cardiovascular:     Rate and Rhythm: Normal rate.     Pulses: Normal pulses.  Pulmonary:     Breath sounds:  Normal breath sounds.  Abdominal:     General: Bowel sounds are normal.  Musculoskeletal:        General: Normal range of motion.  Skin:    General: Skin is warm.  Neurological:     General: No focal deficit present.     Mental Status: She is alert.    Review of Systems  Constitutional: Negative.   HENT: Negative.    Eyes: Negative.   Cardiovascular: Negative.   Gastrointestinal: Negative.   Skin: Negative.   Neurological: Negative.    Blood pressure 109/79, pulse 78, temperature 98 F (36.7 C), resp. rate 18, height 5' (1.524 m), weight 98.4 kg, last menstrual period 07/20/2016, SpO2 96%. Body mass index is 42.38 kg/m.  Diagnosis: Principal Problem:   MDD (major depressive disorder)   PLAN: Safety and Monitoring:  -- Voluntary admission to inpatient psychiatric unit for safety, stabilization and treatment  -- Daily contact with patient to assess and evaluate symptoms and progress in treatment  -- Patient's case to be discussed in multi-disciplinary team meeting  -- Observation Level : q15 minute checks  -- Vital signs:  q12 hours  -- Precautions: suicide, elopement, and assault -- Encouraged patient to participate in unit milieu and in scheduled group therapies  2. Psychiatric Diagnoses and Treatment:      Wellbutrin 150 mg by mouth twice daily for depression, Zoloft 100 mg by mouth daily for depression    3. Medical Issues Being Addressed:   labetalol 150 mg by mouth twice daily, spironolactone 25 mg by mouth daily, Atogepant  60 mg by mouth daily, and Norvasc 10 mg by mouth daily   4. Discharge Planning: Social work is working on follow-up appointments as patient has no outpatient psychiatrist or therapist established before this admission.  Patient will be discharged tomorrow to the family after we set up aftercare plan.  -- Social work and case management to assist with discharge planning and identification of hospital follow-up needs prior to discharge  --  Estimated LOS: 1-2days  Verner Chol, MD 08/28/2023, 1:56 PM

## 2023-08-28 NOTE — Group Note (Signed)
 Date:  08/28/2023 Time:  11:59 AM  Group Topic/Focus:  Goals Group/Community Unit Expectations:   The focus of this group is to help patients establish daily goals to achieve during treatment and discuss how the patient can incorporate goal setting into their daily lives to aide in recovery. Also going over  The unit expectations and rules    Participation Level:  Active  Participation Quality:  Appropriate  Affect:  Appropriate  Cognitive:  Appropriate  Insight: Appropriate  Engagement in Group:  Engaged  Modes of Intervention:  Activity and Discussion  Additional Comments:    Marta Antu 08/28/2023, 11:59 AM

## 2023-08-28 NOTE — Group Note (Signed)
 Recreation Therapy Group Note   Group Topic:General Recreation  Group Date: 08/28/2023 Start Time: 1330 End Time: 1430 Facilitators: Rosina Lowenstein, LRT, CTRS Location: Courtyard  Group Description: Outdoor Recreation. Patients had the option to play corn hole, ring toss, bowling or listening to music while outside in the courtyard getting fresh air and sunlight. LRT and patients discussed things that they enjoy doing in their free time outside of the hospital. LRT encouraged patients to drink water after being active and getting their heart rate up.   Goal Area(s) Addressed: Patient will identify leisure interests.  Patient will practice healthy decision making. Patient will engage in recreation activity.   Affect/Mood: N/A   Participation Level: Did not attend    Clinical Observations/Individualized Feedback: Patient did not attend group.   Plan: Continue to engage patient in RT group sessions 2-3x/week.   Rosina Lowenstein, LRT, CTRS 08/28/2023 4:28 PM

## 2023-08-29 DIAGNOSIS — F331 Major depressive disorder, recurrent, moderate: Secondary | ICD-10-CM | POA: Diagnosis not present

## 2023-08-29 MED ORDER — QULIPTA 60 MG PO TABS: 60.0000 mg | ORAL_TABLET | Freq: Every day | ORAL | 0 refills | Status: DC

## 2023-08-29 NOTE — Group Note (Signed)
 Date:  08/29/2023 Time:  12:53 AM  Group Topic/Focus:  Wrap-Up Group:   The focus of this group is to help patients review their daily goal of treatment and discuss progress on daily workbooks.    Participation Level:  Active  Participation Quality:  Appropriate  Affect:  Appropriate  Cognitive:  Appropriate  Insight: Appropriate  Engagement in Group:  Engaged  Modes of Intervention:  Discussion  Additional Comments:    Maeola Harman 08/29/2023, 12:53 AM

## 2023-08-29 NOTE — Discharge Summary (Addendum)
 Physician Discharge Summary Note  Patient:  Janice [name] is an 58 y.o., female MRN:  [id number] DOB:  April 16, 1966 Patient phone:  [phone] (home)  Patient address:   [address] [place] [address],    Date of Admission:  08/23/2023 Date of Discharge: 08/29/23  Reason for Admission:  Janice [name] is a 58 y.o. female.With a history of depression anxiety and suicidal ideation who presents to the ED for suicidal ideation. Patient was seen at behavioral health urgent care where she voiced increased depression and suicidality. There was a plan for inpatient psychiatric hospitalization however she does sleep with a CPAP at night and behavioral health urgent care was unable to accommodate this medical needs so she was transferred to the ED in order to sleep with her CPAP with plan for ongoing bed search. Denies drug and alcohol use.   Principal Problem: MDD (major depressive disorder) Discharge Diagnoses: Principal Problem:   MDD (major depressive disorder) MDD recurrent moderate with out psychosis  Past Psychiatric History: see h&p  Family Psychiatric  History: see h&p Social History:  Social History   Substance and Sexual Activity  Alcohol Use Not Currently   Comment: occ     Social History   Substance and Sexual Activity  Drug Use No    Social History   Socioeconomic History   Marital status: Single    Spouse name: Not on file   Number of children: 3   Years of education: 10   Highest education level: 10th grade  Occupational History   Occupation: Unemployed  Tobacco Use   Smoking status: Former    Types: Cigarettes    Passive exposure: Past   Smokeless tobacco: Never   Tobacco comments:    Pt states she quit smoking 9 months ago. AB, CMA 06-25-2023  Vaping Use   Vaping status: Never Used  Substance and Sexual Activity   Alcohol use: Not Currently    Comment: occ   Drug use: No   Sexual activity: Yes    Partners: Male    Birth control/protection:  Condom    Comment: single  Other Topics Concern   Not on file  Social History Narrative   Right Handed    Social Drivers of Health   Financial Resource Strain: Low Risk  (09/29/2022)   Overall Financial Resource Strain (CARDIA)    Difficulty of Paying Living Expenses: Not very hard  Food Insecurity: No Food Insecurity (08/23/2023)   Hunger Vital Sign    Worried About Running Out of Food in the Last Year: Never true    Ran Out of Food in the Last Year: Never true  Transportation Needs: No Transportation Needs (08/23/2023)   PRAPARE - Administrator, Civil Service (Medical): No    Lack of Transportation (Non-Medical): No  Physical Activity: Insufficiently Active (09/29/2022)   Exercise Vital Sign    Days of Exercise per Week: 2 days    Minutes of Exercise per Session: 40 min  Stress: No Stress Concern Present (09/29/2022)   [employer] of Occupational Health - Occupational Stress Questionnaire    Feeling of Stress : Only a little  Social Connections: Unknown (09/29/2022)   Social Connection and Isolation Panel [NHANES]    Frequency of Communication with Friends and Family: More than three times a week    Frequency of Social Gatherings with Friends and Family: More than three times a week    Attends Religious Services: 1 to 4 times per year  Active Member of Clubs or Organizations: No    Attends Banker Meetings: Never    Marital Status: Patient declined   Past Medical History:  Past Medical History:  Diagnosis Date   Chest pain 10/04/2007   Esophageal reflux 08/02/2006   Essential hypertension, benign 08/02/2006   Generalized anxiety disorder    Hypertensive urgency 07/10/2022   Leukocytosis 05/26/2022   Major depressive disorder 05/31/2021   Migraine with aura and without status migrainosus, not intractable 05/31/2021   Morbid obesity with body mass index (BMI) of 40.0 to 44.9 in adult 12/13/2021   OSA (obstructive sleep apnea) 06/01/2022    NPSG 07/04/22- AHI 63.4/hr, desaturation to 66%/mean 89.7%, body      Pap smear abnormality of cervix/human papillomavirus (HPV) positive 11/06/2019   Perimenopausal 03/06/2019   Polycythemia, secondary 05/26/2022   Prediabetes 04/04/2019   Primary osteoarthritis of right knee 12/13/2021   Psoriasis 03/06/2019   Stage 3b chronic kidney disease 05/03/2022   Trichomonas infection 11/06/2019   Vitamin D deficiency 09/29/2015    Past Surgical History:  Procedure Laterality Date   CESAREAN SECTION     x2   Family History:  Family History  Problem Relation Age of Onset   Hypertension Mother    Kidney disease Mother    Pancreatic cancer Father    Alcohol abuse Father    Depression Father    Drug abuse Father    Hypertension Father    Hypertension Maternal Grandmother    Stroke Maternal Grandmother    Dementia Maternal Grandmother    Alzheimer's disease Maternal Grandfather    Hypertension Daughter    Colon cancer Neg Hx    Esophageal cancer Neg Hx    Stomach cancer Neg Hx    Rectal cancer Neg Hx     Hospital Course:  Janice [name] is a 58 y.o. female.With a history of depression anxiety and suicidal ideation who presents to the ED for suicidal ideation. Patient was seen at behavioral health urgent care where she voiced increased depression and suicidality. There was a plan for inpatient psychiatric hospitalization however she does sleep with a CPAP at night and behavioral health urgent care was unable to accommodate this medical needs so she was transferred to the ED in order to sleep with her CPAP with plan for ongoing bed search. Denies drug and alcohol use.  Patient was admitted to the geropsych unit for further stabilization.  Multidisciplinary team approach was offered.  Medication management, group/milieu therapy was offered.  Since admission patient has participated in the groups and has taken her medications.  She was restarted on her home medications Wellbutrin 150 mg twice  daily and Zoloft 100 mg.Patient has tolerated medications very well.  On the day of discharge she consistently denied SI/HI/intent/plan.  No access to guns in the house.  Provider reached out to the daughter who did state that her mom is doing a lot better since her admission and are ready to have her back home.  Patient denies auditory/visual hallucinations and is looking forward to go home and be with her family.  Patient remains future oriented and is willing to engage in outpatient resources.  Patient does not meet IVC criteria and inpatient unit is highly restrictive setting at this point.  Physical Findings: AIMS:  , ,  ,  ,    CIWA:    COWS:   P     Psychiatric Specialty Exam:  Presentation  General Appearance:  Appropriate for Environment; Casual  Eye Contact: Fair  Speech: Clear and Coherent  Speech Volume: Normal    Mood and Affect  Mood: Euthymic  Affect: Appropriate   Thought Process  Thought Processes: Coherent  Descriptions of Associations:Intact  Orientation:Full (Time, Place and Person)  Thought Content:Logical  Hallucinations:Hallucinations: None  Ideas of Reference:None  Suicidal Thoughts:Suicidal Thoughts: No  Homicidal Thoughts:Homicidal Thoughts: No   Sensorium  Memory: Immediate Fair; Recent Fair; Remote Fair  Judgment: Fair  Insight: Fair   Art therapist  Concentration: Fair  Attention Span: Fair  Recall: [employer] of Knowledge: Fair  Language: Fair   Psychomotor Activity  Psychomotor Activity: Psychomotor Activity: Normal  Musculoskeletal: Strength & Muscle Tone: within normal limits Gait & Station: normal Assets  Assets: Manufacturing systems engineer; Desire for Improvement; Physical Health   Sleep  Sleep: Sleep: Fair    Physical Exam: Physical Exam ROS Blood pressure 102/64, pulse 71, temperature 98.2 F (36.8 C), resp. rate 18, height 5' (1.524 m), weight 98.4 kg, last menstrual period  07/20/2016, SpO2 96%. Body mass index is 42.38 kg/m.   Social History   Tobacco Use  Smoking Status Former   Types: Cigarettes   Passive exposure: Past  Smokeless Tobacco Never  Tobacco Comments   Pt states she quit smoking 9 months ago. AB, CMA 06-25-2023   Tobacco Cessation:  N/A, patient does not currently use tobacco products   Blood Alcohol level:  Lab Results  Component Value Date   ETH <10 08/21/2023    Metabolic Disorder Labs:  Lab Results  Component Value Date   HGBA1C 5.7 (H) 11/14/2022   MPG 120 08/24/2016   MPG 123 09/27/2015   No results found for: "PROLACTIN" Lab Results  Component Value Date   CHOL 181 05/01/2022   TRIG 141 05/01/2022   HDL 50 05/01/2022   CHOLHDL 3.6 05/01/2022   VLDL 22 08/24/2016   LDLCALC 106 (H) 05/01/2022   LDLCALC 102 (H) 05/31/2021    See Psychiatric Specialty Exam and Suicide Risk Assessment completed by Attending Physician prior to discharge.  Discharge destination:  Home  Is patient on multiple antipsychotic therapies at discharge:  No   Has Patient had three or more failed trials of antipsychotic monotherapy by history:  No  Recommended Plan for Multiple Antipsychotic Therapies: NA  Discharge Instructions     Diet - low sodium heart healthy   Complete by: As directed    Increase activity slowly   Complete by: As directed       Allergies as of 08/29/2023       Reactions   Losartan Other (See Comments)   Chest pain   Neomycin Sulfate Swelling, Rash, Other (See Comments)   Pain in ear, swelling and redness        Medication List     STOP taking these medications    PRESCRIPTION MEDICATION       TAKE these medications      Indication  amLODipine 10 MG tablet Commonly known as: NORVASC Take 1 tablet (10 mg total) by mouth once nightly at bedtime. What changed: when to take this    buPROPion 150 MG 12 hr tablet Commonly known as: WELLBUTRIN SR Take 1 tablet (150 mg total) by mouth 2 (two)  times daily.    famotidine 20 MG tablet Commonly known as: Pepcid Take 1 tablet (20 mg total) by mouth daily as needed for heartburn or indigestion.    labetalol 100 MG tablet Commonly known as: NORMODYNE Take 1.5 tablets (150 mg total)  by mouth 2 (two) times daily. What changed: how much to take    pantoprazole 20 MG tablet Commonly known as: PROTONIX Take 1 tablet (20 mg total) by mouth daily.    polyethylene glycol powder 17 GM/SCOOP powder Commonly known as: GLYCOLAX/MIRALAX Take 17 g by mouth daily as needed (for constipation). What changed: when to take this    Premarin vaginal cream Generic drug: conjugated estrogens Place 1 Applicatorful vaginally once a week.    Qulipta 60 MG Tabs Generic drug: Atogepant Take 1 tablet (60 mg total) by mouth daily. What changed: Another medication with the same name was added. Make sure you understand how and when to take each.    Qulipta 60 MG Tabs Generic drug: Atogepant Take 1 tablet (60 mg total) by mouth daily. What changed: You were already taking a medication with the same name, and this prescription was added. Make sure you understand how and when to take each.    sertraline 100 MG tablet Commonly known as: Zoloft Take 1 tablet (100 mg total) by mouth daily.    spironolactone 25 MG tablet Commonly known as: ALDACTONE Take 1 tablet (25 mg total) by mouth daily.    traMADol 50 MG tablet Commonly known as: ULTRAM Take 1 tablet (50 mg total) by mouth every 12 (twelve) hours as needed.    Ubrelvy 100 MG Tabs Generic drug: Ubrogepant Take 1 tablet by mouth daily as needed (Migraine).    Vitamin D (Ergocalciferol) 1.25 MG (50000 UNIT) Caps capsule Commonly known as: DRISDOL Take 1 capsule (50,000 Units total) by mouth once a week.         Follow-up Information     [facility] Follow up on 10/01/2023.   Why: Your appointment is scheduled for 1:00  PM in-person. Please remember to bring your insurance  card. Contact information: [address] [place] [address] [phone]                 Follow-up recommendations:  Activity:  As Tolerated    Signed: Verner Chol, MD 08/29/2023, 8:59 AM

## 2023-08-29 NOTE — [facility] Suicide Risk Assessment (Signed)
 [facility] INPATIENT:  Family/Significant Other Suicide Prevention Education  Suicide Prevention Education:  Patient Refusal for Family/Significant Other Suicide Prevention Education: The patient Janice [name] has refused to provide written consent for family/significant other to be provided Family/Significant Other Suicide Prevention Education during admission and/or prior to discharge.  Physician notified.  Elza Rafter 08/29/2023, 9:41 AM

## 2023-08-29 NOTE — Progress Notes (Signed)
  [facility] Adult Case Management Discharge Plan :  Will you be returning to the same living situation after discharge:  Yes,  pt will return home  At discharge, do you have transportation home?: Yes,  pt reports her significant other will pick her up  Do you have the ability to pay for your medications: Yes,  [employer] / [employer] H[employer]  Release of information consent forms completed and in the chart;  Patient's signature needed at discharge.  Patient to Follow up at:  Follow-up Information     [facility] Follow up on 10/01/2023.   Why: Your appointment is scheduled for 1:00  PM in-person. Please remember to bring your insurance card. Contact information: [address] [place] [address] [phone]                 Next level of care provider has access to [facility] Link:no  Safety Planning and Suicide Prevention discussed: Yes,  although pt declined with family member CSW went over SPE with pt      Has patient been referred to the Quitline?: Patient refused referral for treatment  Patient has been referred for addiction treatment: No known substance use disorder.  [address], LCSWA 08/29/2023, 9:33 AM

## 2023-08-29 NOTE — [facility] Suicide Risk Assessment (Signed)
 [facility] Discharge Suicide Risk Assessment   Principal Problem: MDD (major depressive disorder) Discharge Diagnoses: Principal Problem:   MDD (major depressive disorder)   Total Time spent with patient: 30 minutes  Musculoskeletal: Strength & Muscle Tone: within normal limits Gait & Station: normal Patient leans: N/A  Psychiatric Specialty Exam  Presentation  General Appearance:  Appropriate for Environment; Casual  Eye Contact: Fair  Speech: Clear and Coherent  Speech Volume: Normal  Handedness: Right   Mood and Affect  Mood: Anxious  Duration of Depression Symptoms: Greater than two weeks  Affect: Appropriate   Thought Process  Thought Processes: Coherent  Descriptions of Associations:Intact  Orientation:Full (Time, Place and Person)  Thought Content:Logical  History of Schizophrenia/Schizoaffective disorder:No  Duration of Psychotic Symptoms:No data recorded Hallucinations:Hallucinations: None  Ideas of Reference:None  Suicidal Thoughts:Suicidal Thoughts: No  Homicidal Thoughts:Homicidal Thoughts: No   Sensorium  Memory: Immediate Fair; Recent Fair; Remote Fair  Judgment: Impaired  Insight: Shallow   Executive Functions  Concentration: Fair  Attention Span: Fair  Recall: [employer] of Knowledge: Fair  Language: Fair   Psychomotor Activity  Psychomotor Activity: Psychomotor Activity: Normal   Assets  Assets: Communication Skills; Desire for Improvement; Resilience   Sleep  Sleep: Sleep: Fair   Physical Exam: Physical Exam ROS Blood pressure 102/64, pulse 71, temperature 98.2 F (36.8 C), resp. rate 18, height 5' (1.524 m), weight 98.4 kg, last menstrual period 07/20/2016, SpO2 96%. Body mass index is 42.38 kg/m.  Mental Status Per Nursing Assessment::   On Admission:  NA  Demographic Factors:  Low socioeconomic status  Loss Factors: Decrease in vocational status  Historical Factors: NA  Risk  Reduction Factors:   Sense of responsibility to family, Religious beliefs about death, Living with another person, especially a relative, Positive social support, Positive therapeutic relationship, and Positive coping skills or problem solving skills  Continued Clinical Symptoms:  Previous Psychiatric Diagnoses and Treatments  Cognitive Features That Contribute To Risk:  None    Suicide Risk:  Minimal: No identifiable suicidal ideation.  Patients presenting with no risk factors but with morbid ruminations; may be classified as minimal risk based on the severity of the depressive symptoms   Follow-up Information     [facility] Follow up on 10/01/2023.   Why: Your appointment is scheduled for 1:00  PM in-person. Please remember to bring your insurance card. Contact information: [address] [place] [address] [phone]                 Plan Of Care/Follow-up recommendations:  Activity:  As tolerated  Verner Chol, MD 08/29/2023, 8:57 AM

## 2023-08-29 NOTE — Progress Notes (Signed)
   08/29/23 1000  Psychosocial Assessment  Eye Contact Brief  Facial Expression Sad  Affect Sad  Speech Logical/coherent  Interaction Minimal  Motor Activity Slow  Appearance/Hygiene In scrubs  Thought Process  Coherency WDL  Content WDL  Delusions None reported or observed  Perception WDL  Hallucination None reported or observed  Judgment Impaired  Confusion None  Danger to Self  Current suicidal ideation? Denies  Agreement Not to Harm Self Yes  Description of Agreement verbal  Danger to Others  Danger to Others None reported or observed   Discharged at this time to home via taxi. Denies SI/HI/AVH. Tolerated all medications. All discharge instructions given to patient with understanding.

## 2023-08-29 NOTE — Progress Notes (Signed)
   08/29/23 0530  15 Minute Checks  Location Bedroom  Visual Appearance Calm  Behavior Sleeping  Sleep (Behavioral Health Patients Only)  Calculate sleep? (Click Yes once per 24 hr at 0600 safety check) Yes  Documented sleep last 24 hours 9.25

## 2023-08-30 ENCOUNTER — Encounter (HOSPITAL_BASED_OUTPATIENT_CLINIC_OR_DEPARTMENT_OTHER): Payer: Self-pay

## 2023-08-30 ENCOUNTER — Other Ambulatory Visit: Payer: Self-pay

## 2023-09-04 ENCOUNTER — Other Ambulatory Visit: Payer: Self-pay

## 2023-09-13 ENCOUNTER — Other Ambulatory Visit: Payer: Self-pay

## 2023-09-14 ENCOUNTER — Ambulatory Visit (HOSPITAL_BASED_OUTPATIENT_CLINIC_OR_DEPARTMENT_OTHER)

## 2023-09-14 DIAGNOSIS — I10 Essential (primary) hypertension: Secondary | ICD-10-CM

## 2023-09-17 ENCOUNTER — Encounter (HOSPITAL_BASED_OUTPATIENT_CLINIC_OR_DEPARTMENT_OTHER): Payer: Self-pay

## 2023-10-02 ENCOUNTER — Other Ambulatory Visit: Payer: Self-pay

## 2023-10-03 ENCOUNTER — Inpatient Hospital Stay

## 2023-10-03 ENCOUNTER — Inpatient Hospital Stay: Admitting: Genetic Counselor

## 2023-10-11 ENCOUNTER — Other Ambulatory Visit: Payer: Self-pay

## 2023-10-17 ENCOUNTER — Other Ambulatory Visit: Payer: Self-pay

## 2023-10-17 MED ORDER — BUPROPION HCL ER (XL) 150 MG PO TB24
150.0000 mg | ORAL_TABLET | Freq: Every day | ORAL | 0 refills | Status: DC
Start: 2023-10-17 — End: 2023-11-15
  Filled 2023-10-17: qty 30, 30d supply, fill #0

## 2023-10-17 MED ORDER — TRAZODONE HCL 50 MG PO TABS
50.0000 mg | ORAL_TABLET | Freq: Every day | ORAL | 0 refills | Status: DC
  Filled 2023-10-17: qty 30, 30d supply, fill #0

## 2023-10-17 MED ORDER — SERTRALINE HCL 50 MG PO TABS
ORAL_TABLET | ORAL | 0 refills | Status: DC
Start: 2023-10-17 — End: 2024-02-21
  Filled 2023-10-17: qty 30, 33d supply, fill #0

## 2023-10-18 ENCOUNTER — Ambulatory Visit (INDEPENDENT_AMBULATORY_CARE_PROVIDER_SITE_OTHER): Admitting: Family

## 2023-10-18 ENCOUNTER — Encounter (HOSPITAL_BASED_OUTPATIENT_CLINIC_OR_DEPARTMENT_OTHER): Payer: Self-pay | Admitting: Family

## 2023-10-18 VITALS — BP 130/84 | HR 84 | Ht 60.0 in | Wt 220.0 lb

## 2023-10-18 DIAGNOSIS — I10 Essential (primary) hypertension: Secondary | ICD-10-CM

## 2023-10-18 DIAGNOSIS — G4733 Obstructive sleep apnea (adult) (pediatric): Secondary | ICD-10-CM | POA: Diagnosis not present

## 2023-10-18 NOTE — Progress Notes (Signed)
 [facility] Initial Assessment:    Date:  10/18/2023   ID:  Janice [name], DOB Feb 20, 1966, MRN [id number]  PCP:  Lawrance Presume, MD  Cardiologist:  None  Nephrologist: Dr. Nance Aw ([facility])  Referring MD: Lawrance Presume, MD   CC: Hypertension  History of Present Illness:    Janice [name] is a 58 y.o. female with a hx of HTN, CKDIII, vitamin D  deficiency, morbid obesity  here to establish care in the [facility].   Established with [facility] 08/16/23 after referral by her PCP.  There was concern about a/ARB due to hyperkalemia though on my review isolated elevated potassium 05/01/2022 of 5.3 with labs 10 days later K4.1 raising question of hemolysis of initial sample causing phosphate elevated potassium.  Since that time potassium 3.9-4.1.  Prior to that 2016-2022 persistent hypokalemia..  Initial diagnosis hypertension at 58 year old and started antihypertensive regimen after pregnancy.  Family history of hypertension in her daughter, granddaughter.  She was using arm cuff at home with BP 120-130s over 90s.  She previously quit smoking tobacco 06/2023.  Alcohol use only socially sparingly.  She was walking working in yard for exercise and following a low-sodium diet.  Her labetalol  was increased to 150 mg twice daily for diastolic BP lowering.  Renal artery duplex 09/14/2023 with no evidence of renal artery stenosis.  Since last seen she did have an admission in March to [facility].   Presents today for follow up independently. Notes her grandson is now 48 months old and home from NICU which has significantly lowered her stress. Has upcoming appointment to establish with a counselor. Notes her exercise routine was understandably interrupted with her mental health issues. Also notes not having much of an appetite and sometimes going days without eating. Discussed focusing on three snacks per day with protein/fiber to  gradually return to nutritional intake. Discussed focusing on one small goal at a time - I.e. adding back nutrition then working towards exercise in the future. Reports occasional chest pain when she is feeling anxious, discussed pursed lip breathing. Reassurance provided atypical for angina. Reports BP at home has been "good" most often 130s.   Previous antihypertensives: Losartan  - chest pain  Past Medical History:  Diagnosis Date   Chest pain 10/04/2007   Esophageal reflux 08/02/2006   Essential hypertension, benign 08/02/2006   Generalized anxiety disorder    Hypertensive urgency 07/10/2022   Leukocytosis 05/26/2022   Major depressive disorder 05/31/2021   Migraine with aura and without status migrainosus, not intractable 05/31/2021   Morbid obesity with body mass index (BMI) of 40.0 to 44.9 in adult 12/13/2021   OSA (obstructive sleep apnea) 06/01/2022   NPSG 07/04/22- AHI 63.4/hr, desaturation to 66%/mean 89.7%, body      Pap smear abnormality of cervix/human papillomavirus (HPV) positive 11/06/2019   Perimenopausal 03/06/2019   Polycythemia, secondary 05/26/2022   Prediabetes 04/04/2019   Primary osteoarthritis of right knee 12/13/2021   Psoriasis 03/06/2019   Stage 3b chronic kidney disease 05/03/2022   Trichomonas infection 11/06/2019   Vitamin D  deficiency 09/29/2015    Past Surgical History:  Procedure Laterality Date   CESAREAN SECTION     x2    Current Medications: Current Meds  Medication Sig   amLODipine  (NORVASC ) 10 MG tablet Take 1 tablet (10 mg total) by mouth once nightly at bedtime. (Patient taking differently: Take 10 mg by mouth daily.)   buPROPion  (WELLBUTRIN  XL) 150 MG 24 hr tablet  Take 1 tablet (150 mg total) by mouth daily.   conjugated estrogens  (PREMARIN ) vaginal cream Place 1 Applicatorful vaginally once a week.   famotidine  (PEPCID ) 20 MG tablet Take 1 tablet (20 mg total) by mouth daily as needed for heartburn or indigestion.   labetalol   (NORMODYNE ) 100 MG tablet Take 1.5 tablets (150 mg total) by mouth 2 (two) times daily. (Patient taking differently: Take 100 mg by mouth 2 (two) times daily.)   polyethylene glycol powder (GLYCOLAX /MIRALAX ) 17 GM/SCOOP powder Mix 1 capful (17 g) in 4-8 ounces in water/juice by mouth daily as needed (for constipation).   spironolactone  (ALDACTONE ) 25 MG tablet Take 1 tablet (25 mg total) by mouth daily.   traZODone  (DESYREL ) 50 MG tablet Take 1 tablet (50 mg total) by mouth at bedtime.   Vitamin D , Ergocalciferol , (DRISDOL ) 1.25 MG (50000 UNIT) CAPS capsule Take 1 capsule (50,000 Units total) by mouth once a week.     Allergies:   Losartan  and Neomycin sulfate   Social History   Socioeconomic History   Marital status: Single    Spouse name: Not on file   Number of children: 3   Years of education: 10   Highest education level: 10th grade  Occupational History   Occupation: Unemployed  Tobacco Use   Smoking status: Former    Types: Cigarettes    Passive exposure: Past   Smokeless tobacco: Never   Tobacco comments:    Pt states she quit smoking 9 months ago. AB, CMA 06-25-2023  Vaping Use   Vaping status: Never Used  Substance and Sexual Activity   Alcohol use: Not Currently    Comment: occ   Drug use: No   Sexual activity: Yes    Partners: Male    Birth control/protection: Condom    Comment: single  Other Topics Concern   Not on file  Social History Narrative   Right Handed    Social Drivers of Health   Financial Resource Strain: Low Risk  (09/29/2022)   Overall Financial Resource Strain (CARDIA)    Difficulty of Paying Living Expenses: Not very hard  Food Insecurity: No Food Insecurity (08/23/2023)   Hunger Vital Sign    Worried About Running Out of Food in the Last Year: Never true    Ran Out of Food in the Last Year: Never true  Transportation Needs: No Transportation Needs (08/23/2023)   PRAPARE - Administrator, Civil Service (Medical): No    Lack of  Transportation (Non-Medical): No  Physical Activity: Insufficiently Active (09/29/2022)   Exercise Vital Sign    Days of Exercise per Week: 2 days    Minutes of Exercise per Session: 40 min  Stress: No Stress Concern Present (09/29/2022)   [employer] of Occupational Health - Occupational Stress Questionnaire    Feeling of Stress : Only a little  Social Connections: Unknown (09/29/2022)   Social Connection and Isolation Panel [NHANES]    Frequency of Communication with Friends and Family: More than three times a week    Frequency of Social Gatherings with Friends and Family: More than three times a week    Attends Religious Services: 1 to 4 times per year    Active Member of [employer] or Organizations: No    Attends Banker Meetings: Never    Marital Status: Patient declined     Family History: The patient's family history includes Alcohol abuse in her father; Alzheimer's disease in her maternal grandfather; Dementia in her maternal grandmother;  Depression in her father; Drug abuse in her father; Hypertension in her daughter, father, maternal grandmother, and mother; Kidney disease in her mother; Pancreatic cancer in her father; Stroke in her maternal grandmother. There is no history of Colon cancer, Esophageal cancer, Stomach cancer, or Rectal cancer.  ROS:   Please see the history of present illness.     All other systems reviewed and are negative.  EKGs/Labs/Other Studies Reviewed:         Recent Labs: 01/26/2023: ALT 8 08/21/2023: BUN 20; Creatinine, Ser 1.56; Hemoglobin 14.0; Platelets 357; Potassium 3.9; Sodium 135   Recent Lipid Panel    Component Value Date/Time   CHOL 181 05/01/2022 1559   TRIG 141 05/01/2022 1559   HDL 50 05/01/2022 1559   CHOLHDL 3.6 05/01/2022 1559   CHOLHDL 3.7 08/24/2016 1634   VLDL 22 08/24/2016 1634   LDLCALC 106 (H) 05/01/2022 1559    Physical Exam:   VS:  BP (!) 135/90 (BP Location: Right Arm)   Pulse 84   Ht 5' (1.524 m)    Wt 220 lb (99.8 kg)   LMP 07/20/2016   SpO2 98%   BMI 42.97 kg/m  , BMI Body mass index is 42.97 kg/m.  Vitals:   10/18/23 1003 10/18/23 1024  BP: (!) 135/90 130/84  Pulse: 84   Height: 5' (1.524 m)   Weight: 220 lb (99.8 kg)   SpO2: 98%   BMI (Calculated): 42.97     GENERAL:  Well appearing HEENT: Pupils equal round and reactive, fundi not visualized, oral mucosa unremarkable NECK:  No jugular venous distention, waveform within normal limits, carotid upstroke brisk and symmetric, no bruits, no thyromegaly LYMPHATICS:  No cervical adenopathy LUNGS:  Clear to auscultation bilaterally HEART:  RRR.  PMI not displaced or sustained,S1 and S2 within normal limits, no S3, no S4, no clicks, no rubs, no murmurs ABD:  Flat, positive bowel sounds normal in frequency in pitch, no bruits, no rebound, no guarding, no midline pulsatile mass, no hepatomegaly, no splenomegaly EXT:  2 plus pulses throughout, no edema, no cyanosis no clubbing SKIN:  No rashes no nodules NEURO:  Cranial nerves II through XII grossly intact, motor grossly intact throughout PSYCH:  Cognitively intact, oriented to person place and time  ASSESSMENT/PLAN:    HTN - BP in clinic 135/90 ? 130/84 without intervention. BP goal <130/80. Planning to increase nutrition and gradually get back to exercise routine. Anticipate BP will continue to improve with lifestyle changes.  Continue present antihypertensive regimen amlodipine  10mg  daily, Spironolactone  25mg  daily, Labetolol to 150mg  BID.  Renal artery duplex 09/2023 with no stenosis.  As BP controlled, will defer further workup such as TSH, renin aldosterone, metanephrines If renin-aldosterone ordered in future will require to hold spironolactone  prior to testing Recommend aiming for 150 minutes of moderate intensity activity per week and following a heart healthy diet.    CKD - Dates back to 2013. Follows with Dr. Nance Aw. Careful titration of antihypertensive.     Depression - Upcoming appointment to establish with counseling. Reviewed pursed lip breathing technique to help during times of anxiety.   OSA - CPAP compliance encouraged.   Screening for Secondary Hypertension:     Relevant Labs/Studies:    Latest Ref Rng & Units 08/21/2023   10:22 PM 06/22/2023    4:51 PM 01/26/2023    3:15 PM  Basic Labs  Sodium 135 - 145 mmol/L 135  141  137   Potassium 3.5 - 5.1 mmol/L 3.9  4.3  4.0   Creatinine 0.44 - 1.00 mg/dL 1.61  0.96  0.45        Latest Ref Rng & Units 05/26/2022    1:59 PM 01/03/2022    4:02 PM  Thyroid    TSH 0.350 - 4.500 uIU/mL 1.610  0.96        Latest Ref Rng & Units 06/30/2022    9:55 AM  Renin/Aldosterone   Aldosterone 0.0 - 30.0 ng/dL 40.9           Latest Ref Rng & Units 05/26/2022    1:59 PM  Cortisol  Cortisol  ug/dL 9.1        01/13/9146   10:26 AM  Renovascular   Renal Artery US  Completed Yes      Disposition:    FU with MD/APP/PharmD in 3-4 months    Medication Adjustments/Labs and Tests Ordered: Current medicines are reviewed at length with the patient today.  Concerns regarding medicines are outlined above.  No orders of the defined types were placed in this encounter.  No orders of the defined types were placed in this encounter.    Signed, Clearnce Curia, NP  10/18/2023 10:09 AM    [facility]

## 2023-10-18 NOTE — Patient Instructions (Addendum)
 Medication Instructions:  Your physician recommends that you continue on your current medications as directed. Please refer to the Current Medication list given to you today.    Follow-Up: Follow up in 3-4 months in [facility] with Dr. Theodis Fiscal, Neomi Banks, NP or PharmD   Special Instructions:   Pursed Lip Breathing Pursed lip breathing is a technique to relieve the feeling of being short of breath.  Being short of breath can make you tense and anxious. Before you start this breathing exercise, take a minute to relax your shoulders and close your eyes. Then: Start the exercise by closing your mouth. Breathe in through your nose, taking a normal breath. You can do this at your normal rate of breathing. If you feel you are not getting enough air, breathe in while slowly counting to 2 or 3. Pucker (purse) your lips as if you were going to whistle. Gently tighten the muscles of your abdomenor press on your abdomen to help push the air out. Breathe out slowly through your pursed lips. Take at least twice as long to breathe out as it takes you to breathe in. Make sure that you breathe out all of the air, but do not force air out.

## 2023-10-22 ENCOUNTER — Encounter: Payer: Self-pay | Admitting: Internal Medicine

## 2023-10-22 ENCOUNTER — Other Ambulatory Visit: Payer: Self-pay

## 2023-10-22 ENCOUNTER — Ambulatory Visit: Payer: Commercial Managed Care - HMO | Attending: Internal Medicine | Admitting: Internal Medicine

## 2023-10-22 VITALS — BP 131/89 | HR 73 | Temp 97.8°F | Ht 60.0 in | Wt 220.0 lb

## 2023-10-22 DIAGNOSIS — Z2821 Immunization not carried out because of patient refusal: Secondary | ICD-10-CM

## 2023-10-22 DIAGNOSIS — F322 Major depressive disorder, single episode, severe without psychotic features: Secondary | ICD-10-CM

## 2023-10-22 DIAGNOSIS — I129 Hypertensive chronic kidney disease with stage 1 through stage 4 chronic kidney disease, or unspecified chronic kidney disease: Secondary | ICD-10-CM

## 2023-10-22 DIAGNOSIS — G4733 Obstructive sleep apnea (adult) (pediatric): Secondary | ICD-10-CM

## 2023-10-22 DIAGNOSIS — L408 Other psoriasis: Secondary | ICD-10-CM | POA: Diagnosis not present

## 2023-10-22 DIAGNOSIS — N1832 Chronic kidney disease, stage 3b: Secondary | ICD-10-CM | POA: Diagnosis not present

## 2023-10-22 DIAGNOSIS — Z599 Problem related to housing and economic circumstances, unspecified: Secondary | ICD-10-CM

## 2023-10-22 DIAGNOSIS — L308 Other specified dermatitis: Secondary | ICD-10-CM

## 2023-10-22 DIAGNOSIS — I10 Essential (primary) hypertension: Secondary | ICD-10-CM

## 2023-10-22 NOTE — Progress Notes (Signed)
 Patient ID: Janice [name], female    DOB: November 28, 1965  MRN: [id number]  CC: Hypertension (HTN f/u. [facility] not taking amlodipine  consistently )   Subjective: [name] is a 58 y.o. female who presents for chronic ds management. Her concerns today include:  Pt with hx of HTN,  CKD 3b, former tob dep, HL, Vit D def, preDM/morbid obesity, MDD, +HPV on pap, GERD, chronic hypokalemia, psoriasis right leg, OA knee (Dr. Christiane Cowing), migraines (Dr. Festus Hubert), OSA (06/2022),   Discussed the use of AI scribe software for clinical note transcription with the patient, who gave verbal consent to proceed.  History of Present Illness Janice [name] is a 58 year old female who presents for follow-up after a recent behavioral health admission.  She was admitted in March for depression and self-harm thoughts, exacerbated by stressors including her daughter's accident and granddaughter's premature birth. Financial constraints have prevented her from obtaining medications. She was previously on Zoloft  100 mg daily but ran out in March. On May 15th, a psychiatrist with [facility] restarted her on Zoloft  50 mg 1/2 tab with a titration plan. She is also prescribed trazodone  for sleep and bupropion  150 mg daily.  She is currently not on any of these medications due to financial limitations.  Still feels depressed but states it is a little better.  Denies any active plans of self-harm.  HTN CKD: She has hypertension and should be on amlodipine , spironolactone , and labetalol , which she stopped during her period of severe depression due to thoughts of overdosing on her medications.  She has not restarted them as yet but feels that she is stable now to restart.   For her CKD, she is followed by nephrology out of [facility].  She saw him recently.  GFR has remained in the 30s with most recent 1 being 35.  She has chronic dermatitis on her right leg, diagnosed as psoriatiform spongiotic dermatitis back in 2020 via skin bx,  with severe itching and bleeding from scratching. She missed a dermatology appointment in January and has tried various creams, including triamcinolone , without relief.  OSA: She has sleep apnea and recently restarted using her CPAP machine after hospitalization.   Ongoing financial stress is present as she awaits disability benefits and has been out of work for about two years.    Patient Active Problem List   Diagnosis Date Noted   MDD (major depressive disorder) 08/23/2023   Low grade squamous intraepithelial lesion (LGSIL) on biopsy of cervix 06/23/2023   Generalized anxiety disorder    OSA (obstructive sleep apnea) 06/01/2022   Polycythemia, secondary 05/26/2022   Leukocytosis 05/26/2022   Stage 3b chronic kidney disease 05/03/2022   Primary osteoarthritis of right knee 12/13/2021   Morbid obesity with body mass index (BMI) of 40.0 to 44.9 in adult 12/13/2021   Migraine with aura and without status migrainosus, not intractable 05/31/2021   Major depressive disorder 05/31/2021   Prediabetes 04/04/2019   Perimenopausal 03/06/2019   Psoriasis 03/06/2019   Vitamin D  deficiency 09/29/2015   Essential hypertension, benign 08/02/2006   Esophageal reflux 08/02/2006     Current Outpatient Medications on File Prior to Visit  Medication Sig Dispense Refill   amLODipine  (NORVASC ) 10 MG tablet Take 1 tablet (10 mg total) by mouth once nightly at bedtime. 90 tablet 1   buPROPion  (WELLBUTRIN  XL) 150 MG 24 hr tablet Take 1 tablet (150 mg total) by mouth daily. 30 tablet 0   conjugated estrogens  (PREMARIN ) vaginal cream Place  1 Applicatorful vaginally once a week. 42.5 g 12   famotidine  (PEPCID ) 20 MG tablet Take 1 tablet (20 mg total) by mouth daily as needed for heartburn or indigestion. 90 tablet 1   polyethylene glycol powder (GLYCOLAX /MIRALAX ) 17 GM/SCOOP powder Mix 1 capful (17 g) in 4-8 ounces in water/juice by mouth daily as needed (for constipation). 3350 g 1   sertraline  (ZOLOFT ) 50  MG tablet Take 0.5 tablets (25 mg total) by mouth daily for 6 days, THEN 1 tablet (50 mg total) daily for 27 days. 30 tablet 0   spironolactone  (ALDACTONE ) 25 MG tablet Take 1 tablet (25 mg total) by mouth daily. 90 tablet 1   traZODone  (DESYREL ) 50 MG tablet Take 1 tablet (50 mg total) by mouth at bedtime. 30 tablet 0   Vitamin D , Ergocalciferol , (DRISDOL ) 1.25 MG (50000 UNIT) CAPS capsule Take 1 capsule (50,000 Units total) by mouth once a week. 13 capsule 4   Atogepant  (QULIPTA ) 60 MG TABS Take 1 tablet (60 mg total) by mouth daily. (Patient not taking: Reported on 10/22/2023) 30 tablet 11   labetalol  (NORMODYNE ) 100 MG tablet Take 1.5 tablets (150 mg total) by mouth 2 (two) times daily. (Patient not taking: Reported on 10/22/2023) 270 tablet 3   pantoprazole  (PROTONIX ) 20 MG tablet Take 1 tablet (20 mg total) by mouth daily. (Patient not taking: Reported on 08/16/2023) 90 tablet 0   Ubrogepant  (UBRELVY ) 100 MG TABS Take 1 tablet by mouth daily as needed (Migraine). (Patient not taking: Reported on 10/22/2023)     No current facility-administered medications on file prior to visit.    Allergies  Allergen Reactions   Losartan  Other (See Comments)    Chest pain   Neomycin Sulfate Swelling, Rash and Other (See Comments)    Pain in ear, swelling and redness    Social History   Socioeconomic History   Marital status: Single    Spouse name: Not on file   Number of children: 3   Years of education: 10   Highest education level: 10th grade  Occupational History   Occupation: Unemployed  Tobacco Use   Smoking status: Former    Types: Cigarettes    Passive exposure: Past   Smokeless tobacco: Never   Tobacco comments:    Pt states she quit smoking 9 months ago. AB, CMA 06-25-2023  Vaping Use   Vaping status: Never Used  Substance and Sexual Activity   Alcohol use: Not Currently    Comment: occ   Drug use: No   Sexual activity: Yes    Partners: Male    Birth control/protection: Condom     Comment: single  Other Topics Concern   Not on file  Social History Narrative   Right Handed    Social Drivers of Health   Financial Resource Strain: Low Risk  (09/29/2022)   Overall Financial Resource Strain (CARDIA)    Difficulty of Paying Living Expenses: Not very hard  Food Insecurity: No Food Insecurity (08/23/2023)   Hunger Vital Sign    Worried About Running Out of Food in the Last Year: Never true    Ran Out of Food in the Last Year: Never true  Transportation Needs: No Transportation Needs (08/23/2023)   PRAPARE - Administrator, Civil Service (Medical): No    Lack of Transportation (Non-Medical): No  Physical Activity: Insufficiently Active (09/29/2022)   Exercise Vital Sign    Days of Exercise per Week: 2 days    Minutes of Exercise per  Session: 40 min  Stress: No Stress Concern Present (09/29/2022)   [employer] of Occupational Health - Occupational Stress Questionnaire    Feeling of Stress : Only a little  Social Connections: Unknown (09/29/2022)   Social Connection and Isolation Panel [NHANES]    Frequency of Communication with Friends and Family: More than three times a week    Frequency of Social Gatherings with Friends and Family: More than three times a week    Attends Religious Services: 1 to 4 times per year    Active Member of [employer] or Organizations: No    Attends Banker Meetings: Never    Marital Status: Patient declined  Catering manager Violence: Not At Risk (08/23/2023)   Humiliation, Afraid, Rape, and Kick questionnaire    Fear of Current or Ex-Partner: No    Emotionally Abused: No    Physically Abused: No    Sexually Abused: No    Family History  Problem Relation Age of Onset   Hypertension Mother    Kidney disease Mother    Pancreatic cancer Father    Alcohol abuse Father    Depression Father    Drug abuse Father    Hypertension Father    Hypertension Maternal Grandmother    Stroke Maternal Grandmother     Dementia Maternal Grandmother    Alzheimer's disease Maternal Grandfather    Hypertension Daughter    Colon cancer Neg Hx    Esophageal cancer Neg Hx    Stomach cancer Neg Hx    Rectal cancer Neg Hx     Past Surgical History:  Procedure Laterality Date   CESAREAN SECTION     x2    ROS: Review of Systems Negative except as stated above  PHYSICAL EXAM: BP 131/89 (BP Location: Left Arm, Patient Position: Sitting, Cuff Size: Large)   Pulse 73   Temp 97.8 F (36.6 C) (Oral)   Ht 5' (1.524 m)   Wt 220 lb (99.8 kg)   LMP 07/20/2016   SpO2 98%   BMI 42.97 kg/m   Physical Exam  General appearance - alert, well appearing, and in no distress Mental status -patient with flat affect.  She answers questions appropriately.   Chest - clear to auscultation, no wheezes, rales or rhonchi, symmetric air entry Heart - normal rate, regular rhythm, normal S1, S2, no murmurs, rubs, clicks or gallops Extremities - peripheral pulses normal, no pedal edema, no clubbing or cyanosis Skin -hyperpigmented rough appearing dermatitis on the lateral aspect of the right leg.     10/22/2023    3:56 PM 08/21/2023    6:04 PM 02/16/2023    2:59 PM  Depression screen PHQ 2/9  Decreased Interest 2 2 2   Down, Depressed, Hopeless 3 2 3   PHQ - 2 Score 5 4 5   Altered sleeping 3 2 2   Tired, decreased energy 3 2 3   Change in appetite 3 2 3   Feeling bad or failure about yourself  3 2 3   Trouble concentrating 3 2 3   Moving slowly or fidgety/restless 2 2 1   Suicidal thoughts 3 2 0  PHQ-9 Score 25 18 20   Difficult doing work/chores Very difficult Extremely dIfficult Somewhat difficult      10/22/2023    3:57 PM 02/16/2023    3:00 PM 12/29/2022    3:14 PM 08/29/2022    2:55 PM  GAD 7 : Generalized Anxiety Score  Nervous, Anxious, on Edge 2 2 0 0  Control/stop worrying 3 3 2  2  Worry too much - different things 3 3 2 2   Trouble relaxing 3 2 2 2   Restless 2 2 0 1  Easily annoyed or irritable 2 2 1 2    Afraid - awful might happen 3 2 1 1   Total GAD 7 Score 18 16 8 10   Anxiety Difficulty Very difficult Somewhat difficult          Latest Ref Rng & Units 08/21/2023   10:22 PM 06/22/2023    4:51 PM 01/26/2023    3:15 PM  CMP  Glucose 70 - 99 mg/dL 161  92  096   BUN 6 - 20 mg/dL 20  21  22    Creatinine 0.44 - 1.00 mg/dL 0.45  4.09  8.11   Sodium 135 - 145 mmol/L 135  141  137   Potassium 3.5 - 5.1 mmol/L 3.9  4.3  4.0   Chloride 98 - 111 mmol/L 98  100  102   CO2 22 - 32 mmol/L 28  26  28    Calcium  8.9 - 10.3 mg/dL 9.3  9.5  9.3   Total Protein 6.5 - 8.1 g/dL   8.1   Total Bilirubin 0.3 - 1.2 mg/dL   0.6   Alkaline Phos 38 - 126 U/L   113   AST 15 - 41 U/L   10   ALT 0 - 44 U/L   8    Lipid Panel     Component Value Date/Time   CHOL 181 05/01/2022 1559   TRIG 141 05/01/2022 1559   HDL 50 05/01/2022 1559   CHOLHDL 3.6 05/01/2022 1559   CHOLHDL 3.7 08/24/2016 1634   VLDL 22 08/24/2016 1634   LDLCALC 106 (H) 05/01/2022 1559    CBC    Component Value Date/Time   WBC 15.5 (H) 08/21/2023 2222   RBC 5.98 (H) 08/21/2023 2222   HGB 14.0 08/21/2023 2222   HGB 12.0 01/26/2023 1515   HGB 14.9 05/01/2022 1559   HCT 47.6 (H) 08/21/2023 2222   HCT 47.3 (H) 05/01/2022 1559   PLT 357 08/21/2023 2222   PLT 342 01/26/2023 1515   PLT 299 05/01/2022 1559   MCV 79.6 (L) 08/21/2023 2222   MCV 78 (L) 05/01/2022 1559   MCH 23.4 (L) 08/21/2023 2222   MCHC 29.4 (L) 08/21/2023 2222   RDW 15.6 (H) 08/21/2023 2222   RDW 13.4 05/01/2022 1559   LYMPHSABS 3.4 08/21/2023 2222   LYMPHSABS 2.2 03/06/2019 1344   MONOABS 0.8 08/21/2023 2222   EOSABS 0.1 08/21/2023 2222   EOSABS 0.2 03/06/2019 1344   BASOSABS 0.1 08/21/2023 2222   BASOSABS 0.1 03/06/2019 1344    ASSESSMENT AND PLAN: 1. Essential hypertension (Primary) Not at goal.  Not taking antihypertensives due to depression and financial constraints. Willing to resume medications.  Advised to check with pharmacy to see if they would  allow her to credit her medications or waive her co-pay since she has Medicaid. She should restart spironolactone  25 mg daily, labetalol  150 mg twice a day and amlodipine  10 mg daily  2. CKD stage 3b, GFR 30-44 ml/min (HCC) Stable.  Continue to monitor  3. Severe major depression, single episode, without psychotic features (HCC) She is plugged in with behavioral health with [facility]. She denies active plans of self-harm.  If she develops these thoughts, she was advised to be seen in behavioral health emergency room.  Advised to speak with the pharmacy about allowing her to get her medications on credit or waving her  co-pay.  She should be on Wellbutrin  XL 150 mg daily, Zoloft  and trazodone .  4. Psoriasiform dermatitis I have given her the information for [facility] dermatology where she was referred in January.  Advised to call to get an appointment.  5. Herpes zoster vaccination declined Recommended.  Patient declined.  6. OSA on CPAP Encouraged her to continue using CPAP consistently  7. Financial difficulties    Patient was given the opportunity to ask questions.  Patient verbalized understanding of the plan and was able to repeat key elements of the plan.   This documentation was completed using Paediatric nurse.  Any transcriptional errors are unintentional.  No orders of the defined types were placed in this encounter.    Requested Prescriptions    No prescriptions requested or ordered in this encounter    No follow-ups on file.  Concetta Dee, MD, FACP

## 2023-10-22 NOTE — Patient Instructions (Addendum)
 [facility] General Dermatology - Medical [facility] [phone] [address], [place] [address] Ph# [phone] Fax [phone]  VISIT SUMMARY:  Today, we discussed your recent behavioral health admission and several ongoing health issues. We reviewed your medications, including those for depression, hypertension, and sleep apnea, and addressed your chronic dermatitis. We also talked about the importance of resuming your medications and using your CPAP machine regularly.  YOUR PLAN:  -DEPRESSION: Depression is a mental health condition characterized by persistent feelings of sadness and loss of interest. You were previously on Zoloft , which was stopped due to financial issues. Your psychiatrist has restarted you on Zoloft  50 mg with a plan to increase the dose. Please check with your pharmacy for medication credit options and resume Zoloft  as instructed. If you experience any self-harm thoughts, seek immediate help at a behavioral health emergency room.  -HYPERTENSION: Hypertension, or high blood pressure, is a condition where the force of the blood against your artery walls is too high. Your blood pressure is slightly above the target range, and you have not been taking your medications due to depression and financial constraints. Please resume taking your antihypertensive medications (amlodipine , spironolactone , labetalol ) and check with your pharmacy for medication credit options.  -CHRONIC KIDNEY DISEASE, UNSPECIFIED: Chronic kidney disease is a condition where your kidneys are not functioning optimally. Good blood pressure control is essential to preserve kidney function. Please resume taking your antihypertensive medications to help maintain your kidney function.  -OBSTRUCTIVE SLEEP APNEA: Obstructive sleep apnea is a sleep disorder where your breathing repeatedly stops and starts during sleep. You have recently restarted using your CPAP machine after your  hospitalization. Please continue using your CPAP machine every night.  -PSORIASIS: Psoriasis is a skin condition that causes red, itchy, and scaly patches. You have chronic dermatitis on your right leg, diagnosed as psoriatiform spongiotic dermatitis. Previous treatments have not been effective. Please contact a dermatologist to schedule an appointment, and a biopsy may be needed if the dermatologist recommends it.  INSTRUCTIONS:  Please follow up with your psychiatrist as scheduled and check with your pharmacy for medication credit options. Resume taking your antihypertensive medications and continue using your CPAP machine nightly. Contact a dermatologist to schedule an appointment for your chronic dermatitis. If you experience any self-harm thoughts, seek immediate help at a behavioral health emergency room.

## 2023-10-22 NOTE — Progress Notes (Signed)
 03/20/23- 80 yoF former smoker for sleep evaluation courtesy of Dr Concetta Dee with concern of OSA on CPAP for [facility]. Medical problem list includes Migraine, HTN, GERD, Psoriasis, Osteoarthritis, CKD3b, Depression, Polycythemia secondary, Leukocytosis, Obesity,  NPSG 07/04/22- AHI 63.4/hr, desaturation to 66%/mean 89.7%, body weight 232 lbs CPAP titration to19 on 4/22 CPAP/ [employer] Epworth score- 17 Body weight today-222 lbs Sleep study was done because of history of loud snoring and polycythemia raising concern of nocturnal hypoxemia.Janice [name]  CPAP successfully started this year, does reduce her snoring.  She is having some mask discomfort and needs help with refit.  She understands weight loss would help her.  She is satisfied to continue working with CPAP.  No history of ENT surgery.  Not aware of lung disease.  Stopped smoking in April.  Once we have her settled with CPAP use we will recheck overnight oximetry and she should probably have PFT because of her smoking history. We will contact [employer] to establish download capability and set at auto 5-20.                      //will need PFT, ONOX on CPAP next visit//  06/25/23- 57 yoF former smoker followed for OSA on CPAP , polycythemia,  Medical problem list includes Migraine, HTN, GERD, Psoriasis, Osteoarthritis, CKD3b, Depression, Polycythemia secondary, Leukocytosis, Obesity,  CPAP [employer] auto 5-20 Download compliance-70%, AHI 2/hr Body weight today-220 lbs Breathing ok. Remains off tobacco for 9 months. Pending colonoscopy tomorrow. To wear CPAP in Recovery. Discussed evaluation for possible hypoxia now that she is on CPAP. Discussed compliance goals.  Discussed the use of AI scribe software for clinical note transcription with the patient, who gave verbal consent to proceed.  History of Present Illness   The patient, with a history of high blood pressure and sleep apnea, presents for a routine follow-up. She expresses concern about an  upcoming colonoscopy due to her high blood pressure. She is adherent to her blood pressure medications and is hopeful that her blood pressure will stabilize.  Regarding her sleep apnea, she is using a CPAP machine nightly but is interested in trying a mask instead of the current pillows. She has been mostly compliant with the CPAP use, achieving the minimum goal of four hours per night about seventy percent of the time. The machine is set to range between five and twenty centimeters of water pressure, spending most of its time between eight and thirteen centimeters. With this setting, she is only experiencing two breakthrough apneas an hour, which is considered quite good.  The patient quit smoking nine months ago. There was a previous concern about a high red blood cell count, or polycythemia, potentially triggered by low oxygen levels. This is being monitored to see if it improves with CPAP use.    Assessment and Plan:    Obstructive Sleep Apnea Patient is using CPAP machine regularly with good control of apneas (AHI <5). Patient expressed interest in trying a mask instead of nasal pillows. -Continue CPAP use. -Consider mask trial. -Order overnight oximetry to assess oxygen levels during CPAP use.  COPD mixed type - albuterol  inhaler for trial  Hypertension Patient reports taking antihypertensive medications regularly. Concerns about high blood pressure readings prior to colonoscopy. -Continue current antihypertensive regimen. -Encourage regular blood pressure monitoring.  Polycythemia Previous concern about high red blood cell count potentially due to low oxygen levels. Patient has quit smoking and is using CPAP regularly. -Monitor red blood cell count. -Consider pulmonary function  test to assess lung function.  Smoking Cessation Patient quit smoking 9 months ago. -Encourage continued abstinence from smoking.  Colonoscopy Patient has concerns about upcoming colonoscopy due to high  blood pressure. -Reassure patient about safety of colonoscopy. -Encourage communication with GI doctor regarding concerns.   10/23/23-57 yoF former smoker followed for OSA on CPAP , polycythemia,  Medical problem list includes Migraine, HTN, GERD, Psoriasis, Osteoarthritis, CKD3b, Depression, Polycythemia secondary, Leukocytosis, Obesity,  CPAP [employer] auto 5-20 Download compliance-50%, AHI 3.3/hr Body weight today- 222 lbs Hosp in March Behavioral Health-Depression Overnight oximetry never done Last Hgb WNL 14 in March. PFT- 10/23/23- moderate obstruction, insignif to BD, Lung volume wnl, DLCO-wnl Download reviewed.  Discussed compliance goals and CPAP cleaning.  Needs refill rescue inhaler. Not smoking. Some DOE, not much and no worse.  ROS-see HPI   + = positive Constitutional:    weight loss, night sweats, fevers, chills, fatigue, lassitude. HEENT:    headaches, difficulty swallowing, +tooth/dental problems, sore throat,       sneezing, itching, +ear ache, nasal congestion, post nasal drip, snoring CV:    chest pain, orthopnea, PND, +swelling in lower extremities, anasarca, dizziness, palpitations Resp:   +shortness of breath with exertion or at rest.                productive cough,   non-productive cough, coughing up of blood.              change in color of mucus.  wheezing.   Skin:    rash or lesions. GI:   +heartburn, indigestion, abdominal pain, nausea, vomiting, diarrhea,                 change in bowel habits, loss of appetite GU: dysuria, change in color of urine, no urgency or frequency.   flank pain. MS:   +joint pain, stiffness, decreased range of motion, back pain. Neuro-     nothing unusual Psych:  change in mood or affect.  depression or +anxiety.   memory loss.  OBJ- Physical Exam General- Alert, Oriented, Affect-appropriate, Distress- none acute, +morbid obesity Skin- rash-none, lesions- none, excoriation- none Lymphadenopathy- none Head- atraumatic             Eyes- Gross vision intact, PERRLA, conjunctivae and secretions clear            Ears- Hearing, canals-normal            Nose- Clear, no-Septal dev, mucus, polyps, erosion, perforation             Throat- Mallampati IV , mucosa clear , drainage- none, tonsils- atrophic, +teeth Neck- flexible , trachea midline, no stridor , thyroid  nl, carotid no bruit Chest - symmetrical excursion , unlabored           Heart/CV- RRR , no murmur , no gallop  , no rub, nl s1 s2                           - JVD- none , edema- none, stasis changes- none, varices- none           Lung- clear to P&A, wheeze- none, cough- none , dullness-none, rub- none           Chest wall-  Abd-  Br/ Gen/ Rectal- Not done, not indicated Extrem- cyanosis- none, clubbing, none, atrophy- none, strength- nl Neuro- grossly intact to observation

## 2023-10-23 ENCOUNTER — Other Ambulatory Visit: Payer: Self-pay

## 2023-10-23 ENCOUNTER — Ambulatory Visit (INDEPENDENT_AMBULATORY_CARE_PROVIDER_SITE_OTHER): Payer: Commercial Managed Care - HMO | Admitting: Internal Medicine

## 2023-10-23 ENCOUNTER — Encounter: Payer: Self-pay | Admitting: Internal Medicine

## 2023-10-23 ENCOUNTER — Ambulatory Visit: Payer: Commercial Managed Care - HMO | Admitting: Internal Medicine

## 2023-10-23 VITALS — BP 126/92 | HR 70 | Ht 60.0 in | Wt 222.2 lb

## 2023-10-23 DIAGNOSIS — J4489 Other specified chronic obstructive pulmonary disease: Secondary | ICD-10-CM

## 2023-10-23 DIAGNOSIS — G4733 Obstructive sleep apnea (adult) (pediatric): Secondary | ICD-10-CM

## 2023-10-23 DIAGNOSIS — J449 Chronic obstructive pulmonary disease, unspecified: Secondary | ICD-10-CM

## 2023-10-23 DIAGNOSIS — Z87891 Personal history of nicotine dependence: Secondary | ICD-10-CM

## 2023-10-23 DIAGNOSIS — J439 Emphysema, unspecified: Secondary | ICD-10-CM

## 2023-10-23 LAB — PULMONARY FUNCTION TEST
DL/VA % pred: 137 %
DL/VA: 6 ml/min/mmHg/L
DLCO unc % pred: 101 %
DLCO unc: 18 ml/min/mmHg
FEF 25-75 Post: 2.47 L/s
FEF 25-75 Pre: 1.83 L/s
FEF2575-%Change-Post: 35 %
FEF2575-%Pred-Post: 111 %
FEF2575-%Pred-Pre: 82 %
FEV1-%Change-Post: 5 %
FEV1-%Pred-Post: 72 %
FEV1-%Pred-Pre: 68 %
FEV1-Post: 1.63 L
FEV1-Pre: 1.54 L
FEV1FVC-%Change-Post: 6 %
FEV1FVC-%Pred-Pre: 106 %
FEV6-%Change-Post: -1 %
FEV6-%Pred-Post: 65 %
FEV6-%Pred-Pre: 66 %
FEV6-Post: 1.83 L
FEV6-Pre: 1.85 L
FEV6FVC-%Pred-Post: 103 %
FEV6FVC-%Pred-Pre: 103 %
FVC-%Change-Post: -1 %
FVC-%Pred-Post: 63 %
FVC-%Pred-Pre: 64 %
FVC-Post: 1.83 L
FVC-Pre: 1.85 L
Post FEV1/FVC ratio: 89 %
Post FEV6/FVC ratio: 100 %
Pre FEV1/FVC ratio: 83 %
Pre FEV6/FVC Ratio: 100 %
RV % pred: 97 %
RV: 1.7 L
TLC % pred: 81 %
TLC: 3.62 L

## 2023-10-23 MED ORDER — ALBUTEROL SULFATE HFA 108 (90 BASE) MCG/ACT IN AERS
2.0000 | INHALATION_SPRAY | Freq: Four times a day (QID) | RESPIRATORY_TRACT | 6 refills | Status: AC | PRN
  Filled 2023-10-23: qty 18, 25d supply, fill #0
  Filled 2023-12-17: qty 18, 25d supply, fill #1

## 2023-10-23 NOTE — Progress Notes (Signed)
 Full pft performed today.

## 2023-10-23 NOTE — Patient Instructions (Addendum)
 We can continue CPAP auto 5-20  Script sent for albuterol rescue inhaler     inhale 2 puffs every 6 hours If Needed, for shortness of breath, cough, wheeze  Please call if we can help

## 2023-10-23 NOTE — Patient Instructions (Signed)
 Full pft performed today.

## 2023-10-25 ENCOUNTER — Other Ambulatory Visit: Payer: Self-pay

## 2023-10-25 ENCOUNTER — Other Ambulatory Visit (HOSPITAL_BASED_OUTPATIENT_CLINIC_OR_DEPARTMENT_OTHER): Payer: Self-pay

## 2023-10-26 ENCOUNTER — Telehealth: Payer: Self-pay | Admitting: Internal Medicine

## 2023-10-26 ENCOUNTER — Other Ambulatory Visit: Payer: Self-pay

## 2023-10-26 ENCOUNTER — Other Ambulatory Visit: Payer: Self-pay | Admitting: Internal Medicine

## 2023-10-26 NOTE — Telephone Encounter (Signed)
 Copied from CRM [id number]. Topic: Referral - Question >> Oct 26, 2023  1:09 PM Chrystal Crape R wrote:  Pt needs assistance finding a dermatologist that accepts medicaid, near the [place].

## 2023-10-30 NOTE — Telephone Encounter (Signed)
 Called & spoke to the patient. Verified name & DOB. Informed that [facility] referral was sent in 06/12/2023. She was most likely scheduled in [facility] due to a sooner availability than [place] location. Advised patient to call back and inquire if she is able to schedule appointment in [place]. Gave office contact information. Patient expressed verbal understanding of all discussed.

## 2023-10-31 ENCOUNTER — Other Ambulatory Visit: Payer: Self-pay

## 2023-11-14 ENCOUNTER — Encounter: Payer: Self-pay | Admitting: Internal Medicine

## 2023-11-14 DIAGNOSIS — J449 Chronic obstructive pulmonary disease, unspecified: Secondary | ICD-10-CM | POA: Insufficient documentation

## 2023-11-14 NOTE — Assessment & Plan Note (Signed)
 Mild initial complaints Plan- trial sample rescue inhaler with instruction

## 2023-11-14 NOTE — Assessment & Plan Note (Signed)
 Benefits from CPAP with good control. Discussed compliance goals.

## 2023-11-15 ENCOUNTER — Other Ambulatory Visit (HOSPITAL_COMMUNITY): Payer: Self-pay

## 2023-11-15 ENCOUNTER — Other Ambulatory Visit: Payer: Self-pay

## 2023-11-15 MED ORDER — BUPROPION HCL ER (XL) 150 MG PO TB24
150.0000 mg | ORAL_TABLET | Freq: Every day | ORAL | 0 refills | Status: DC
  Filled 2023-11-15: qty 90, 90d supply, fill #0

## 2023-11-15 MED ORDER — TRAZODONE HCL 50 MG PO TABS
50.0000 mg | ORAL_TABLET | Freq: Every day | ORAL | 0 refills | Status: DC
  Filled 2023-11-15: qty 90, 90d supply, fill #0

## 2023-11-15 MED ORDER — SERTRALINE HCL 100 MG PO TABS
100.0000 mg | ORAL_TABLET | Freq: Every day | ORAL | 0 refills | Status: DC
  Filled 2023-11-15: qty 90, 90d supply, fill #0

## 2023-11-20 ENCOUNTER — Other Ambulatory Visit: Payer: Self-pay

## 2023-11-23 ENCOUNTER — Other Ambulatory Visit: Payer: Self-pay

## 2023-11-23 MED ORDER — CLOBETASOL PROPIONATE 0.05 % EX OINT
TOPICAL_OINTMENT | CUTANEOUS | 3 refills | Status: DC
  Filled 2023-11-23: qty 60, 30d supply, fill #0

## 2023-11-27 ENCOUNTER — Inpatient Hospital Stay

## 2023-11-27 ENCOUNTER — Other Ambulatory Visit: Payer: Self-pay

## 2023-11-27 ENCOUNTER — Inpatient Hospital Stay: Admitting: Genetic Counselor

## 2023-11-27 NOTE — Progress Notes (Deleted)
 REFERRING PROVIDER: Vicci Barnie NOVAK, MD [address] [place],  [place] [address]  PRIMARY PROVIDER:  Vicci Barnie NOVAK, MD  PRIMARY REASON FOR VISIT:  ***  HISTORY OF PRESENT ILLNESS:   Ms. Cruickshank, a 58 y.o. female, was seen for a [facility] cancer genetics consultation at the request of Dr. Vicci due to a personal history of colon polyps.  Ms. Frey presents to clinic today to discuss the possibility of a hereditary predisposition to cancer, to discuss genetic testing, and to further clarify her future cancer risks, as well as potential cancer risks for family members.    *** Ms. Meinhart is a 58 y.o. female with no personal history of cancer. She had a colonoscopy in January 2025 that identified >10 tubular adenomas.   CANCER HISTORY:  Oncology History   No history exists.    RISK FACTORS:  Menarche was at age ***.  First live birth at age ***.  OCP use for approximately {Numbers 1-12 multi-select:20307} years.  Ovaries intact: {Yes/No-Ex:120004}.  Uterus intact: {Yes/No-Ex:120004}.  Menopausal status: {Menopause:31378}.  HRT use: {Numbers 1-12 multi-select:20307} years. Colonoscopy: yes; 2025 colonoscopy identified 9 tubular adenomas plus 5 additional polyps that were a mixture of tubular adenomas and hyperplastic polyps (>10 tubular adenomas). Mammogram within the last year: {Yes/No-Ex:120004}. Number of breast biopsies: {Numbers 1-12 multi-select:20307}. Up to date with pelvic exams: {Yes/No-Ex:120004}. Any excessive radiation exposure in the past: {Yes/No-Ex:120004}  Past Medical History:  Diagnosis Date   Chest pain 10/04/2007   Esophageal reflux 08/02/2006   Essential hypertension, benign 08/02/2006   Generalized anxiety disorder    Hypertensive urgency 07/10/2022   Leukocytosis 05/26/2022   Major depressive disorder 05/31/2021   Migraine with aura and without status migrainosus, not intractable 05/31/2021   Morbid obesity with body mass index (BMI) of  40.0 to 44.9 in adult 12/13/2021   OSA (obstructive sleep apnea) 06/01/2022   NPSG 07/04/22- AHI 63.4/hr, desaturation to 66%/mean 89.7%, body      Pap smear abnormality of cervix/human papillomavirus (HPV) positive 11/06/2019   Perimenopausal 03/06/2019   Polycythemia, secondary 05/26/2022   Prediabetes 04/04/2019   Primary osteoarthritis of right knee 12/13/2021   Psoriasis 03/06/2019   Stage 3b chronic kidney disease 05/03/2022   Trichomonas infection 11/06/2019   Vitamin D  deficiency 09/29/2015    Past Surgical History:  Procedure Laterality Date   CESAREAN SECTION     x2    Social History   Socioeconomic History   Marital status: Single    Spouse name: Not on file   Number of children: 3   Years of education: 10   Highest education level: 10th grade  Occupational History   Occupation: Unemployed  Tobacco Use   Smoking status: Former    Types: Cigarettes    Passive exposure: Past   Smokeless tobacco: Never   Tobacco comments:    Pt states she quit smoking 9 months ago. AB, CMA 06-25-2023  Vaping Use   Vaping status: Never Used  Substance and Sexual Activity   Alcohol use: Not Currently    Comment: occ   Drug use: No   Sexual activity: Yes    Partners: Male    Birth control/protection: Condom    Comment: single  Other Topics Concern   Not on file  Social History Narrative   Right Handed    Social Drivers of Health   Financial Resource Strain: Low Risk  (09/29/2022)   Overall Financial Resource Strain (CARDIA)    Difficulty of Paying  Living Expenses: Not very hard  Food Insecurity: No Food Insecurity (08/23/2023)   Hunger Vital Sign    Worried About Running Out of Food in the Last Year: Never true    Ran Out of Food in the Last Year: Never true  Transportation Needs: No Transportation Needs (08/23/2023)   PRAPARE - Administrator, Civil Service (Medical): No    Lack of Transportation (Non-Medical): No  Physical Activity: Insufficiently Active  (09/29/2022)   Exercise Vital Sign    Days of Exercise per Week: 2 days    Minutes of Exercise per Session: 40 min  Stress: No Stress Concern Present (09/29/2022)   [employer] of Occupational Health - Occupational Stress Questionnaire    Feeling of Stress : Only a little  Social Connections: Unknown (09/29/2022)   Social Connection and Isolation Panel    Frequency of Communication with Friends and Family: More than three times a week    Frequency of Social Gatherings with Friends and Family: More than three times a week    Attends Religious Services: 1 to 4 times per year    Active Member of [employer] or Organizations: No    Attends Banker Meetings: Never    Marital Status: Patient declined     FAMILY HISTORY:  We obtained a detailed, 4-generation family history.  Significant diagnoses are listed below: ***   Ms. Lehn is {aware/unaware} of previous family history of genetic testing for hereditary cancer risks. There {IS NO:12509} reported Ashkenazi Jewish ancestry. There {IS NO:12509} known consanguinity.  GENETIC COUNSELING ASSESSMENT: Ms. Tahir is a 58 y.o. female with a personal history of colon polyps which is somewhat suggestive of a hereditary predisposition to cancer given >10 tubular adenomas identified on her colonoscopy. We, therefore, discussed and recommended the following at today's visit.   DISCUSSION: We discussed that 5 - 10% of cancer is hereditary, with most cases of adenomatous polyposis associated with APC and MUTYH.  There are other genes that can be associated with hereditary polyposis/cancer syndromes.  We discussed that testing is beneficial for several reasons including knowing how to follow individuals and understanding if other family members could be at risk for cancer and allowing them to undergo genetic testing.   We reviewed the characteristics, features and inheritance patterns of hereditary cancer syndromes. We also discussed genetic  testing, including the appropriate family members to test, the process of testing, insurance coverage and turn-around-time for results. We discussed the implications of a negative, positive, carrier and/or variant of uncertain significant result. We recommended Ms. Chacko pursue genetic testing for a panel that includes genes associated with *** cancer.   Ms. Mccarroll  was offered a common hereditary cancer panel (40 genes) and an expanded pan-cancer panel (77 genes). Ms. Moon was informed of the benefits and limitations of each panel, including that expanded pan-cancer panels contain genes that do not have clear management guidelines at this point in time.  We also discussed that as the number of genes included on a panel increases, the chances of variants of uncertain significance increases. After considering the benefits and limitations of each gene panel, Ms. Christine elected to have ***.  Based on Ms. Jaffe's personal history of >10 tubular adenomas, she meets medical criteria for genetic testing. Despite that she meets criteria, she may still have an out of pocket cost. We discussed that if her out of pocket cost for testing is over $100, the laboratory should contact them to discuss self-pay prices, patient  pay assistance programs, if applicable, and other billing options.  PLAN: After considering the risks, benefits, and limitations, Ms. Ehler provided informed consent to pursue genetic testing and the blood sample was sent to {Lab} Laboratories for analysis of the {test}. Results should be available within approximately {TAT TIME} weeks' time, at which point they will be disclosed by telephone to Ms. Devinney, as will any additional recommendations warranted by these results. Ms. Yanes will receive a summary of her genetic counseling visit and a copy of her results once available. This information will also be available in Epic.   *** Despite our recommendation, Ms. Buhl did not wish to pursue genetic  testing at today's visit. We understand this decision and remain available to coordinate genetic testing at any time in the future. We, therefore, recommend Ms. Cupp continue to follow the cancer screening guidelines given by her primary healthcare provider.  ***Based on Ms. Matheny's family history, we recommended her ***, who was diagnosed with *** at age ***, have genetic counseling and testing. Ms. Thomure will let us  know if we can be of any assistance in coordinating genetic counseling and/or testing for this family member.   Lastly, we encouraged Ms. Doi to remain in contact with cancer genetics annually so that we can continuously update the family history and inform her of any changes in cancer genetics and testing that may be of benefit for this family.   Ms. Engert questions were answered to her satisfaction today. Our contact information was provided should additional questions or concerns arise. Thank you for the referral and allowing us  to share in the care of your patient.   Anber Mckiver, MS, [facility] Genetic Counselor [email or web address] (P) [phone]  *** minutes were spent on the date of the encounter in service to the patient including preparation, face-to-face consultation, documentation and care coordination.  ***The patient brought ***.  ***The patient was seen alone.  Drs. Gudena and/or Lanny were available to discuss this case as needed.   _______________________________________________________________________ For Office Staff:  Number of people involved in session: *** Was an Intern/ student involved with case: {YES/NO:63}

## 2023-11-28 ENCOUNTER — Other Ambulatory Visit: Payer: Self-pay

## 2023-11-28 ENCOUNTER — Encounter: Payer: Self-pay | Admitting: Genetic Counselor

## 2023-11-28 ENCOUNTER — Inpatient Hospital Stay

## 2023-11-28 ENCOUNTER — Other Ambulatory Visit: Payer: Self-pay | Admitting: Genetic Counselor

## 2023-11-28 ENCOUNTER — Inpatient Hospital Stay: Attending: Genetic Counselor | Admitting: Genetic Counselor

## 2023-11-28 DIAGNOSIS — Z800 Family history of malignant neoplasm of digestive organs: Secondary | ICD-10-CM | POA: Insufficient documentation

## 2023-11-28 DIAGNOSIS — Z860100 Personal history of colon polyps, unspecified: Secondary | ICD-10-CM | POA: Insufficient documentation

## 2023-11-28 DIAGNOSIS — Z1379 Encounter for other screening for genetic and chromosomal anomalies: Secondary | ICD-10-CM

## 2023-11-28 LAB — GENETIC SCREENING ORDER

## 2023-11-28 NOTE — Progress Notes (Signed)
 REFERRING PROVIDER: Vicci Barnie NOVAK, MD [address] [place],  [place] [address]  PRIMARY PROVIDER:  Vicci Barnie NOVAK, MD  PRIMARY REASON FOR VISIT:  Encounter Diagnoses  Name Primary?   History of colonic polyps Yes   Family history of pancreatic cancer    HISTORY OF PRESENT ILLNESS:   Janice [name], a 58 y.o. female, was seen for a [facility] cancer genetics consultation at the request of Dr. Vicci due to a personal history of colon polyps.  Janice [name] presents to clinic today to discuss the possibility of a hereditary predisposition to cancer, to discuss genetic testing, and to further clarify her future cancer risks, as well as potential cancer risks for family members.   Janice [name] is a 58 y.o. female with no personal history of cancer. She had her first colonoscopy in January 2025 that identified >10 tubular adenomas.   RISK FACTORS:  Menarche was at age 74.  First live birth at age 61.  OCP use for approximately 5 years.  Ovaries intact: yes.  Uterus intact: yes.  Menopausal status: postmenopausal.  HRT use: currently using estrogen cream  Colonoscopy: yes; 2025 colonoscopy identified 9 tubular adenomas plus 5 additional polyps that were a mixture of tubular adenomas and hyperplastic polyps (>10 tubular adenomas total). Mammogram within the last year: no. Number of breast biopsies: 0. Any excessive radiation exposure in the past: no  Past Medical History:  Diagnosis Date   Chest pain 10/04/2007   Esophageal reflux 08/02/2006   Essential hypertension, benign 08/02/2006   Generalized anxiety disorder    Hypertensive urgency 07/10/2022   Leukocytosis 05/26/2022   Major depressive disorder 05/31/2021   Migraine with aura and without status migrainosus, not intractable 05/31/2021   Morbid obesity with body mass index (BMI) of 40.0 to 44.9 in adult 12/13/2021   OSA (obstructive sleep apnea) 06/01/2022   NPSG 07/04/22- AHI 63.4/hr, desaturation to 66%/mean 89.7%,  body      Pap smear abnormality of cervix/human papillomavirus (HPV) positive 11/06/2019   Perimenopausal 03/06/2019   Polycythemia, secondary 05/26/2022   Prediabetes 04/04/2019   Primary osteoarthritis of right knee 12/13/2021   Psoriasis 03/06/2019   Stage 3b chronic kidney disease 05/03/2022   Trichomonas infection 11/06/2019   Vitamin D  deficiency 09/29/2015    Past Surgical History:  Procedure Laterality Date   CESAREAN SECTION     x2    Social History   Socioeconomic History   Marital status: Single    Spouse name: Not on file   Number of children: 3   Years of education: 10   Highest education level: 10th grade  Occupational History   Occupation: Unemployed  Tobacco Use   Smoking status: Former    Types: Cigarettes    Passive exposure: Past   Smokeless tobacco: Never   Tobacco comments:    Pt states she quit smoking 9 months ago. AB, CMA 06-25-2023  Vaping Use   Vaping status: Never Used  Substance and Sexual Activity   Alcohol use: Not Currently    Comment: occ   Drug use: No   Sexual activity: Yes    Partners: Male    Birth control/protection: Condom    Comment: single  Other Topics Concern   Not on file  Social History Narrative   Right Handed    Social Drivers of Health   Financial Resource Strain: Low Risk  (09/29/2022)   Overall Financial Resource Strain (CARDIA)    Difficulty of Paying Living Expenses: Not  very hard  Food Insecurity: No Food Insecurity (08/23/2023)   Hunger Vital Sign    Worried About Running Out of Food in the Last Year: Never true    Ran Out of Food in the Last Year: Never true  Transportation Needs: No Transportation Needs (08/23/2023)   PRAPARE - Administrator, Civil Service (Medical): No    Lack of Transportation (Non-Medical): No  Physical Activity: Insufficiently Active (09/29/2022)   Exercise Vital Sign    Days of Exercise per Week: 2 days    Minutes of Exercise per Session: 40 min  Stress: No Stress  Concern Present (09/29/2022)   [employer] of Occupational Health - Occupational Stress Questionnaire    Feeling of Stress : Only a little  Social Connections: Unknown (09/29/2022)   Social Connection and Isolation Panel    Frequency of Communication with Friends and Family: More than three times a week    Frequency of Social Gatherings with Friends and Family: More than three times a week    Attends Religious Services: 1 to 4 times per year    Active Member of [employer] or Organizations: No    Attends Banker Meetings: Never    Marital Status: Patient declined     FAMILY HISTORY:  We obtained a detailed, 4-generation family history.  Significant diagnoses are listed below: Family History  Problem Relation Age of Onset   Pancreatic cancer Father 24 - 63   Cancer Paternal Aunt        unknown type      Janice [name] is unaware of previous family history of genetic testing for hereditary cancer risks. There is no reported Ashkenazi Jewish ancestry.   GENETIC COUNSELING ASSESSMENT: Janice [name] is a 58 y.o. female with a personal history of colon polyps which is somewhat suggestive of a hereditary predisposition to cancer given >10 tubular adenomas identified on her colonoscopy. We, therefore, discussed and recommended the following at today's visit.   DISCUSSION: We discussed that 5 - 10% of cancer is hereditary, with most cases of adenomatous polyposis associated with APC and MUTYH.  There are other genes that can be associated with hereditary polyposis/cancer syndromes.  We discussed that testing is beneficial for several reasons including knowing how to follow individuals and understanding if other family members could be at risk for cancer and allowing them to undergo genetic testing.   We reviewed the characteristics, features and inheritance patterns of hereditary cancer syndromes. We also discussed genetic testing, including the appropriate family members to test, the process  of testing, insurance coverage and turn-around-time for results. We discussed the implications of a negative, positive, carrier and/or variant of uncertain significant result. We recommended Janice [name] pursue genetic testing for a panel that includes genes associated with polyposis and cancer.   Janice [name]  was offered a common hereditary cancer panel (40 genes) and an expanded pan-cancer panel (77 genes). Janice [name] was informed of the benefits and limitations of each panel, including that expanded pan-cancer panels contain genes that do not have clear management guidelines at this point in time.  We also discussed that as the number of genes included on a panel increases, the chances of variants of uncertain significance increases. After considering the benefits and limitations of each gene panel, Janice [name] elected to have [facility] CancerNext-Expanded Panel.  The CancerNext-Expanded gene panel offered by [facility] and includes sequencing, rearrangement, and RNA analysis for the following 77 genes: AIP, ALK, APC, ATM, AXIN2, BAP1, BARD1,  BMPR1A, BRCA1, BRCA2, BRIP1, CDC73, CDH1, CDK4, CDKN1B, CDKN2A, CEBPA, CHEK2, CTNNA1, DDX41, DICER1, ETV6, FH, FLCN, GATA2, LZTR1, MAX, MBD4, MEN1, MET, MLH1, MSH2, MSH3, MSH6, MUTYH, NF1, NF2, NTHL1, PALB2, PHOX2B, PMS2, POT1, PRKAR1A, PTCH1, PTEN, RAD51C, RAD51D, RB1, RET, RPS20, RUNX1, SDHA, SDHAF2, SDHB, SDHC, SDHD, SMAD4, SMARCA4, SMARCB1, SMARCE1, STK11, SUFU, TMEM127, TP53, TSC1, TSC2, VHL, and WT1 (sequencing and deletion/duplication); EGFR, HOXB13, KIT, MITF, PDGFRA, POLD1, and POLE (sequencing only); EPCAM and GREM1 (deletion/duplication only).   Based on Janice [name]'s personal history of >10 tubular adenomas, she meets medical criteria for genetic testing. Despite that she meets criteria, she may still have an out of pocket cost. We discussed that if her out of pocket cost for testing is over $100, the laboratory should contact them to discuss self-pay prices, patient  pay assistance programs, if applicable, and other billing options.  PLAN: After considering the risks, benefits, and limitations, Janice [name] provided informed consent to pursue genetic testing and the blood sample was sent to [facility] for analysis of the CancerNext-Expanded Panel. Results should be available within approximately 2-3 weeks' time, at which point they will be disclosed by telephone to Janice [name], as will any additional recommendations warranted by these results. Janice [name] will receive a summary of her genetic counseling visit and a copy of her results once available. This information will also be available in Epic.   Janice [name] questions were answered to her satisfaction today. Our contact information was provided should additional questions or concerns arise. Thank you for the referral and allowing us  to share in the care of your patient.   Dalvin Clipper, MS, [facility] Genetic Counselor [email or web address] (P) [phone]  60 minutes were spent on the date of the encounter in service to the patient including preparation, face-to-face consultation, documentation and care coordination. The patient was seen alone.  Drs. Gudena and/or Lanny were available to discuss this case as needed.   _______________________________________________________________________ For Office Staff:  Number of people involved in session: 1 Was an Intern/ student involved with case: no

## 2023-12-10 ENCOUNTER — Telehealth: Payer: Self-pay | Admitting: Genetic Counselor

## 2023-12-10 ENCOUNTER — Encounter: Payer: Self-pay | Admitting: Genetic Counselor

## 2023-12-10 DIAGNOSIS — Z1379 Encounter for other screening for genetic and chromosomal anomalies: Secondary | ICD-10-CM | POA: Insufficient documentation

## 2023-12-10 NOTE — Telephone Encounter (Signed)
 I contacted Ms. Copeman to discuss her genetic testing results. No pathogenic variants were identified in the 77 genes analyzed. Detailed clinic note to follow.  The test report has been scanned into EPIC and is located under the Molecular Pathology section of the Results Review tab.  A portion of the result report is included below for reference.   Tatsuo Musial, MS, [facility] Genetic Counselor [email or web address] (P) [phone]

## 2023-12-14 ENCOUNTER — Ambulatory Visit: Payer: Self-pay | Admitting: Genetic Counselor

## 2023-12-14 NOTE — Progress Notes (Signed)
 HPI:   Janice [name] was previously seen in the [facility] clinic due to a personal history of colon polyps and concerns regarding a hereditary predisposition to cancer. Please refer to our prior cancer genetics clinic note for more information regarding our discussion, assessment and recommendations, at the time. Janice [name] recent genetic test results were disclosed to her, as were recommendations warranted by these results. These results and recommendations are discussed in more detail below.  CANCER HISTORY:  Oncology History   No history exists.    FAMILY HISTORY:  We obtained a detailed, 4-generation family history.  Significant diagnoses are listed below:      Family History  Problem Relation Age of Onset   Pancreatic cancer Father 58 - 67   Cancer Paternal Aunt          unknown type         [name] is unaware of previous family history of genetic testing for hereditary cancer risks. There is no reported Ashkenazi Jewish ancestry.   GENETIC TEST RESULTS:  The [facility] CancerNext-Expanded Panel found no pathogenic mutations.   The CancerNext-Expanded gene panel offered by [facility] and includes sequencing, rearrangement, and RNA analysis for the following 77 genes: AIP, ALK, APC, ATM, AXIN2, BAP1, BARD1, BMPR1A, BRCA1, BRCA2, BRIP1, CDC73, CDH1, CDK4, CDKN1B, CDKN2A, CEBPA, CHEK2, CTNNA1, DDX41, DICER1, ETV6, FH, FLCN, GATA2, LZTR1, MAX, MBD4, MEN1, MET, MLH1, MSH2, MSH3, MSH6, MUTYH, NF1, NF2, NTHL1, PALB2, PHOX2B, PMS2, POT1, PRKAR1A, PTCH1, PTEN, RAD51C, RAD51D, RB1, RET, RPS20, RUNX1, SDHA, SDHAF2, SDHB, SDHC, SDHD, SMAD4, SMARCA4, SMARCB1, SMARCE1, STK11, SUFU, TMEM127, TP53, TSC1, TSC2, VHL, and WT1 (sequencing and deletion/duplication); EGFR, HOXB13, KIT, MITF, PDGFRA, POLD1, and POLE (sequencing only); EPCAM and GREM1 (deletion/duplication only).   The test report has been scanned into EPIC and is located under the Molecular Pathology section of the Results  Review tab.  A portion of the result report is included below for reference. Genetic testing reported out on 12/09/2023.       Even though a pathogenic variant was not identified, possible explanations for her personal history of polyps/the cancer in the family may include: There may be no hereditary risk for polyposis/cancer in the family. The polyps/cancers in Janice [name] and/or her family may be due to other genetic or environmental factors. There may be a gene mutation in one of these genes that current testing methods cannot detect, but that chance is small. There could be another gene that has not yet been discovered, or that we have not yet tested, that is responsible for the polyposis/cancer diagnoses in the family.  It is also possible there is a hereditary cause for the cancer in the family that Janice [name] did not inherit.  Therefore, it is important to remain in touch with cancer genetics in the future so that we can continue to offer Janice [name] the most up to date genetic testing.   ADDITIONAL GENETIC TESTING:  We discussed with [name] that her genetic testing was fairly extensive.  If there are genes identified to increase polyposis/cancer risk that can be analyzed in the future, we would be happy to discuss and coordinate this testing at that time.    CANCER SCREENING RECOMMENDATIONS:  Janice [name] test result is considered negative (normal).  This means that we have not identified a hereditary cause for her personal history of colon polyps/family history of cancer at this time.   An individual's cancer risk and medical management are not determined by  genetic test results alone. Overall cancer risk assessment incorporates additional factors, including personal medical history, family history, and any available genetic information that may result in a personalized plan for cancer prevention and surveillance. Therefore, it is recommended she continue to follow the cancer management and  screening guidelines provided by her healthcare providers.  RECOMMENDATIONS FOR FAMILY MEMBERS:   Since she did not inherit a mutation in a cancer predisposition gene included on this panel, her children could not have inherited a mutation from her in one of these genes. Other members of the family may still carry a pathogenic variant in one of these genes that Janice [name] did not inherit. Based on the family history, we recommend her brother have genetic counseling and testing.   FOLLOW-UP:  Cancer genetics is a rapidly advancing field and it is possible that new genetic tests will be appropriate for her and/or her family members in the future. We encouraged her to remain in contact with cancer genetics on an annual basis so we can update her personal and family histories and let her know of advances in cancer genetics that may benefit this family.   Our contact number was provided. [name] questions were answered to her satisfaction, and she knows she is welcome to call us  at anytime with additional questions or concerns.   Halsey Hammen, MS, [facility] Genetic Counselor [email or web address] [phone]) [phone]

## 2023-12-17 ENCOUNTER — Other Ambulatory Visit: Payer: Self-pay

## 2023-12-17 ENCOUNTER — Other Ambulatory Visit: Payer: Self-pay | Admitting: Internal Medicine

## 2023-12-17 MED ORDER — PANTOPRAZOLE SODIUM 20 MG PO TBEC
20.0000 mg | DELAYED_RELEASE_TABLET | Freq: Every day | ORAL | 0 refills | Status: DC
  Filled 2023-12-17: qty 30, 30d supply, fill #0

## 2023-12-18 ENCOUNTER — Other Ambulatory Visit: Payer: Self-pay

## 2023-12-27 ENCOUNTER — Telehealth: Payer: Self-pay | Admitting: Internal Medicine

## 2023-12-27 NOTE — Telephone Encounter (Signed)
 Copied from CRM [id number]. Topic: General - Other >> Dec 27, 2023  3:34 PM Marissa P wrote:  Reason for CRM: Patient needs some paperwork filled out to get her food stamps, she will stopping by to get it dropped off

## 2024-01-03 NOTE — Telephone Encounter (Signed)
 Noted

## 2024-01-08 ENCOUNTER — Other Ambulatory Visit (HOSPITAL_COMMUNITY): Payer: Self-pay

## 2024-01-16 ENCOUNTER — Ambulatory Visit: Payer: Medicaid Other | Admitting: Neurology

## 2024-01-18 NOTE — Progress Notes (Unsigned)
 NEUROLOGY FOLLOW UP OFFICE NOTE  Janice [name] [id number]  Assessment/Plan:   Migraine without aura, without status migrainosus, not intractable Cognitive dysfunction secondary to psychiatric distress Depression Obstructive sleep apnea      1  Migraine prevention:  Restart Qulipta  60mg  once every day. 2  Migraine rescue:  Ubrelvy  100mg . Given cerebrovascular disease, would avoid triptans. 3  Limit use of pain relievers to no more than 9 days out of the month to prevent risk of rebound or medication-overuse headache. 4.  Continue CPAP 5  Follow up 8 months       Subjective:  Janice [name] is a 58 year old female with polycythemia, COPD, OSA, CKD stage 3b, morbid obesity, major depressive disorder and HTN who follows up for headache.   UPDATE: She was doing well on Qulipta  and Ubrelvy .  She has been off of Ubrelvy  and Qulipta  for 4 months because she changed insurance from [place] to [place].  They have been daily and lasting several hours with Tylenol   Current NSAIDS/analgesics:  Tylenol , ASA 81mg  daily, Current triptans:  none Current ergotamine:  none Current anti-emetic:  none Current muscle relaxants:  none Current Antihypertensive medications:  labetalol , amlodipine , spironolactone  Current Antidepressant medications:  sertaline, Wellbutrin  Current Anticonvulsant medications:  none Current anti-CGRP:  none Current Vitamins/Herbal/Supplements:  none Current Antihistamines/Decongestants:  none Other therapy:  none Hormone/birth control:  none   HISTORY:  Migraines: She began experiencing persistent headache and visual disturbance in October 2022.  She described a 9/10 throbbing frontal/temporal headache with photophobia and some blurred vision, confusion, slurred speech, and off balance.  No nausea, vomiting, double vision, visual obscurations, pulsatile tinnitus, numbness ane weakness.  Would wake her up out of her sleep.  She was seen in the ED on 3 occasions  between 04/04/2021 and 04/14/2021.  Systolic blood pressure had been from 159 to 200s.  CT head on 04/04/2021 showed chronic small vessel ischemic changes.  Follow up MRV of head was negative for dural venous thrombosis or stenosis.  They started subsiding in December after she was started on daily topiramate  and Fioricet , decreasing to 3-4/10 lasting 15-20 minutes occurring every 3 to 4 days (treats with ASA or ibuprofen  daily).  She started taking Fioricet  almost daily.    Subjective memory problems: She reported memory problems - gets lost driving on familiar routes or forgetting to take her medications.  Again, this started in October 2022.  Stopped topiramate  which did not improve the memory.  To further assess memory, B12 and TSH were checked in August 2023, which were 632 and 0.96 respectively. Blood pressure has still been uncontrolled.  No prior history of headaches.  Just prior to onset of headaches, the father of her children passed away which caused a great deal of emotional distress.  MRI of brain with and without contrast on 10/13/2021 showed mild-moderate chronic small vessel ischemic changes including gliosis along the splenium of the corpus callosum.  She underwent neuropsychological evaluation on 04/18/2023 which revealed that her cognitive deficits likely due to depression and not consistent with a neurodegenerative disease.   Past medications: Past NSAIDS/analgesics:  naproxen , Fioricet , tramadol  Past abortive triptans:  none Past abortive ergotamine:  none Past muscle relaxants:  tizanidine  Past anti-emetic:  none Past antihypertensive medications:  propranolol , lisinopril , HCTZ, losartan  Past antidepressant medications:  none Past anticonvulsant medications:  topiramate  (thought it may have contributed to cognitive changes) Past anti-CGRP:  Qulipta  60mg  (effective), Ubrelvy  100mg  (effective) Past vitamins/Herbal/Supplements:  none Past antihistamines/decongestants:  none  Other  past therapies:  none  PAST MEDICAL HISTORY: Past Medical History:  Diagnosis Date   Chest pain 10/04/2007   Esophageal reflux 08/02/2006   Essential hypertension, benign 08/02/2006   Generalized anxiety disorder    Hypertensive urgency 07/10/2022   Leukocytosis 05/26/2022   Major depressive disorder 05/31/2021   Migraine with aura and without status migrainosus, not intractable 05/31/2021   Morbid obesity with body mass index (BMI) of 40.0 to 44.9 in adult 12/13/2021   OSA (obstructive sleep apnea) 06/01/2022   NPSG 07/04/22- AHI 63.4/hr, desaturation to 66%/mean 89.7%, body      Pap smear abnormality of cervix/human papillomavirus (HPV) positive 11/06/2019   Perimenopausal 03/06/2019   Polycythemia, secondary 05/26/2022   Prediabetes 04/04/2019   Primary osteoarthritis of right knee 12/13/2021   Psoriasis 03/06/2019   Stage 3b chronic kidney disease 05/03/2022   Trichomonas infection 11/06/2019   Vitamin D  deficiency 09/29/2015    MEDICATIONS: Current Outpatient Medications on File Prior to Visit  Medication Sig Dispense Refill   albuterol  (VENTOLIN  HFA) 108 (90 Base) MCG/ACT inhaler Inhale 2 puffs into the lungs every 6 (six) hours as needed for wheezing or shortness of breath. 18 g 6   amLODipine  (NORVASC ) 10 MG tablet Take 1 tablet (10 mg total) by mouth once nightly at bedtime. 90 tablet 1   Atogepant  (QULIPTA ) 60 MG TABS Take 1 tablet (60 mg total) by mouth daily. 30 tablet 11   buPROPion  (WELLBUTRIN  XL) 150 MG 24 hr tablet Take 1 tablet (150 mg total) by mouth daily. 90 tablet 0   clobetasol  ointment (TEMOVATE ) 0.05 % Apply to the affected areas on the lower extremities and elbows 1-2 times daily as needed. 60 g 3   conjugated estrogens  (PREMARIN ) vaginal cream Place 1 Applicatorful vaginally once a week. 42.5 g 12   famotidine  (PEPCID ) 20 MG tablet Take 1 tablet (20 mg total) by mouth daily as needed for heartburn or indigestion. 90 tablet 1   labetalol  (NORMODYNE ) 100 MG  tablet Take 1.5 tablets (150 mg total) by mouth 2 (two) times daily. 270 tablet 3   pantoprazole  (PROTONIX ) 20 MG tablet Take 1 tablet (20 mg total) by mouth daily. 90 tablet 0   polyethylene glycol powder (GLYCOLAX /MIRALAX ) 17 GM/SCOOP powder Mix 1 capful (17 g) in 4-8 ounces in water/juice by mouth daily as needed (for constipation). 3350 g 1   sertraline  (ZOLOFT ) 100 MG tablet Take 1 tablet (100 mg total) by mouth daily. 90 tablet 0   sertraline  (ZOLOFT ) 50 MG tablet Take 0.5 tablets (25 mg total) by mouth daily for 6 days, THEN 1 tablet (50 mg total) daily for 27 days. 30 tablet 0   spironolactone  (ALDACTONE ) 25 MG tablet Take 1 tablet (25 mg total) by mouth daily. 90 tablet 1   traZODone  (DESYREL ) 50 MG tablet Take 1 tablet (50 mg total) by mouth at bedtime. 90 tablet 0   Ubrogepant  (UBRELVY ) 100 MG TABS Take 1 tablet by mouth daily as needed (Migraine).     Vitamin D , Ergocalciferol , (DRISDOL ) 1.25 MG (50000 UNIT) CAPS capsule Take 1 capsule (50,000 Units total) by mouth once a week. 13 capsule 4   No current facility-administered medications on file prior to visit.    ALLERGIES: Allergies  Allergen Reactions   Losartan  Other (See Comments)    Chest pain   Neomycin Sulfate Swelling, Rash and Other (See Comments)    Pain in ear, swelling and redness    FAMILY HISTORY: Family History  Problem Relation  Age of Onset   Hypertension Mother    Kidney disease Mother    Pancreatic cancer Father 3 - 30   Alcohol abuse Father    Depression Father    Drug abuse Father    Hypertension Father    Cancer Paternal Aunt        unknown type   Hypertension Maternal Grandmother    Stroke Maternal Grandmother    Dementia Maternal Grandmother    Alzheimer's disease Maternal Grandfather    Hypertension Daughter    Colon cancer Neg Hx    Esophageal cancer Neg Hx    Stomach cancer Neg Hx    Rectal cancer Neg Hx       Objective:  Blood pressure 122/82, pulse 73, height 5' (1.524 m), weight  223 lb (101.2 kg), last menstrual period 07/20/2016, SpO2 97%. General: No acute distress.  Patient appears well-groomed.      Juliene Dunnings, DO  CC: Barnie Louder, MD

## 2024-01-21 ENCOUNTER — Ambulatory Visit (INDEPENDENT_AMBULATORY_CARE_PROVIDER_SITE_OTHER): Payer: Medicaid Other | Admitting: Neurology

## 2024-01-21 ENCOUNTER — Encounter: Payer: Self-pay | Admitting: Neurology

## 2024-01-21 ENCOUNTER — Other Ambulatory Visit: Payer: Self-pay

## 2024-01-21 VITALS — BP 122/82 | HR 73 | Ht 60.0 in | Wt 223.0 lb

## 2024-01-21 DIAGNOSIS — R4189 Other symptoms and signs involving cognitive functions and awareness: Secondary | ICD-10-CM | POA: Diagnosis not present

## 2024-01-21 DIAGNOSIS — G43009 Migraine without aura, not intractable, without status migrainosus: Secondary | ICD-10-CM | POA: Diagnosis not present

## 2024-01-21 DIAGNOSIS — G4733 Obstructive sleep apnea (adult) (pediatric): Secondary | ICD-10-CM | POA: Diagnosis not present

## 2024-01-21 MED ORDER — QULIPTA 60 MG PO TABS
60.0000 mg | ORAL_TABLET | Freq: Every day | ORAL | 11 refills | Status: DC
  Filled 2024-01-21: qty 30, 30d supply, fill #0

## 2024-01-21 MED ORDER — UBRELVY 100 MG PO TABS
1.0000 | ORAL_TABLET | ORAL | 5 refills | Status: DC | PRN
Start: 2024-01-21 — End: 2024-01-21

## 2024-01-21 MED ORDER — UBRELVY 100 MG PO TABS: ORAL_TABLET | ORAL | Status: DC

## 2024-01-21 MED ORDER — QULIPTA 60 MG PO TABS: 60.0000 mg | ORAL_TABLET | Freq: Every day | ORAL | 11 refills | Status: DC

## 2024-01-21 MED ORDER — UBRELVY 100 MG PO TABS
1.0000 | ORAL_TABLET | ORAL | 5 refills | Status: DC | PRN
  Filled 2024-01-21: qty 10, 30d supply, fill #0

## 2024-01-21 NOTE — Patient Instructions (Signed)
  Start Qulipta  60mg  daily.  Take Ubrelvy  100mg  at earliest onset of headache.  May repeat dose once in 2 hours if needed.  Maximum 2 tablets in 24 hours. Limit use of pain relievers to no more than 9 days out of the month.  These medications include acetaminophen , NSAIDs (ibuprofen /Advil /Motrin , naproxen /Aleve , triptans (Imitrex /sumatriptan ), Excedrin, and narcotics.  This will help reduce risk of rebound headaches. Be aware of common food triggers Routine exercise Stay adequately hydrated (aim for 64 oz water daily) Keep headache diary Maintain proper stress management Maintain proper sleep hygiene Do not skip meals Consider supplements:  magnesium  citrate 400mg  daily, riboflavin 400mg  daily, coenzyme Q10 100mg  three times daily.

## 2024-01-22 ENCOUNTER — Other Ambulatory Visit: Payer: Self-pay

## 2024-01-23 ENCOUNTER — Other Ambulatory Visit: Payer: Self-pay

## 2024-01-24 ENCOUNTER — Encounter (HOSPITAL_BASED_OUTPATIENT_CLINIC_OR_DEPARTMENT_OTHER): Admitting: Family

## 2024-01-28 ENCOUNTER — Telehealth: Payer: Self-pay

## 2024-01-28 ENCOUNTER — Other Ambulatory Visit (HOSPITAL_COMMUNITY): Payer: Self-pay

## 2024-01-28 NOTE — Telephone Encounter (Signed)
 Pharmacy Patient Advocate Encounter   Received notification from CoverMyMeds that prior authorization for Ubrelvy  100MG  tablets is required/requested.   Insurance verification completed.   The patient is insured through [facility] .   Per test claim: PA required; PA submitted to above mentioned insurance via Latent Key/confirmation #/EOC [id number] Status is pending

## 2024-01-29 ENCOUNTER — Other Ambulatory Visit: Payer: Self-pay

## 2024-01-30 ENCOUNTER — Other Ambulatory Visit: Payer: Self-pay

## 2024-01-30 NOTE — Telephone Encounter (Signed)
 Pharmacy Patient Advocate Encounter  Received notification from [facility] MEDICAID that Prior Authorization for Ubrelvy  100MG  tablets has been DENIED.  Full denial letter will be uploaded to the media tab. See denial reason below.  Here are the policy requirements your request did not meet:  Your doctor must tell us  the following:  You didn't have more than 15 migraine days per month in the last 6 months You will not be taking this drug with a strong CYP3A4 inhibitory ( a type of drug that could increase the amount of the drug asked for in your body to an unsafe level) You do not have end-stage kidney disease  (This is when your creatinine clearance (CrCl) less than 39mL/min, this is a measurement which tells your doctor how well your kidneys are working)  PA #/Case ID/Reference #: [id number]

## 2024-02-07 ENCOUNTER — Other Ambulatory Visit: Payer: Self-pay

## 2024-02-07 ENCOUNTER — Other Ambulatory Visit (HOSPITAL_COMMUNITY): Payer: Self-pay

## 2024-02-08 ENCOUNTER — Other Ambulatory Visit: Payer: Self-pay

## 2024-02-08 NOTE — Telephone Encounter (Signed)
 Unfortunately the insurance doesn't look at the future of what the patient could be experiencing and they look at the here and now. And because she has been experiencing headache days the way she has been, that is what they look at.

## 2024-02-10 ENCOUNTER — Other Ambulatory Visit: Payer: Self-pay

## 2024-02-12 ENCOUNTER — Other Ambulatory Visit (HOSPITAL_COMMUNITY): Payer: Self-pay

## 2024-02-12 ENCOUNTER — Telehealth: Payer: Self-pay

## 2024-02-12 NOTE — Telephone Encounter (Signed)
 Pharmacy Patient Advocate Encounter   Received notification from CoverMyMeds that prior authorization for Qulipta  60MG  tablets is required/requested.   Insurance verification completed.   The patient is insured through [facility] MEDICAID .   Per test claim: PA required; PA submitted to above mentioned insurance via Latent Key/confirmation #/EOC [id number] Status is pending

## 2024-02-13 NOTE — Telephone Encounter (Signed)
 Pharmacy Patient Advocate Encounter  Received notification from [facility] MEDICAID that Prior Authorization for QULIPTA  60MG  has been DENIED.  Full denial letter will be uploaded to the media tab. See denial reason below.    PA #/Case ID/Reference #: [id number]

## 2024-02-13 NOTE — Telephone Encounter (Signed)
 Telephone call to patient as well mychart message, Per D.Jaffe, Patient needs to try 2 of the monthly injections before insurance will consider approving Qulipta . If she is agreeable, please send in prescription for Ajovy  every 28 days with 5 refills.

## 2024-02-15 ENCOUNTER — Other Ambulatory Visit: Payer: Self-pay

## 2024-02-15 ENCOUNTER — Telehealth: Payer: Self-pay | Admitting: Neurology

## 2024-02-15 DIAGNOSIS — R7989 Other specified abnormal findings of blood chemistry: Secondary | ICD-10-CM | POA: Insufficient documentation

## 2024-02-15 MED ORDER — AJOVY 225 MG/1.5ML ~~LOC~~ SOAJ
225.0000 mg | SUBCUTANEOUS | 5 refills | Status: AC
  Filled 2024-02-15 – 2024-03-19 (×2): qty 1.5, 28d supply, fill #0
  Filled 2024-04-28 – 2024-04-29 (×3): qty 1.5, 28d supply, fill #1

## 2024-02-15 NOTE — Telephone Encounter (Signed)
 See PA note.

## 2024-02-15 NOTE — Telephone Encounter (Signed)
 Patient advised medication sent to the pharmacy.

## 2024-02-15 NOTE — Telephone Encounter (Signed)
 Patient Janice [name] stating she was returning a call to someone

## 2024-02-15 NOTE — Addendum Note (Signed)
 Addended by: OZELL JESUSA PARAS on: 02/15/2024 03:15 PM   Modules accepted: Orders

## 2024-02-17 ENCOUNTER — Observation Stay (HOSPITAL_COMMUNITY)
Admission: EM | Admit: 2024-02-17 | Discharge: 2024-02-21 | Disposition: A | Discharge status: Home / Self Care | Attending: Internal Medicine | Admitting: Internal Medicine

## 2024-02-17 ENCOUNTER — Other Ambulatory Visit: Payer: Self-pay

## 2024-02-17 ENCOUNTER — Emergency Department (HOSPITAL_COMMUNITY)

## 2024-02-17 DIAGNOSIS — R079 Chest pain, unspecified: Secondary | ICD-10-CM | POA: Diagnosis present

## 2024-02-17 DIAGNOSIS — R109 Unspecified abdominal pain: Principal | ICD-10-CM

## 2024-02-17 DIAGNOSIS — N1832 Chronic kidney disease, stage 3b: Secondary | ICD-10-CM | POA: Insufficient documentation

## 2024-02-17 DIAGNOSIS — R1084 Generalized abdominal pain: Secondary | ICD-10-CM | POA: Diagnosis not present

## 2024-02-17 DIAGNOSIS — I7121 Aneurysm of the ascending aorta, without rupture: Secondary | ICD-10-CM | POA: Insufficient documentation

## 2024-02-17 DIAGNOSIS — I129 Hypertensive chronic kidney disease with stage 1 through stage 4 chronic kidney disease, or unspecified chronic kidney disease: Secondary | ICD-10-CM | POA: Diagnosis not present

## 2024-02-17 DIAGNOSIS — R072 Precordial pain: Secondary | ICD-10-CM | POA: Diagnosis not present

## 2024-02-17 DIAGNOSIS — Z7982 Long term (current) use of aspirin: Secondary | ICD-10-CM | POA: Insufficient documentation

## 2024-02-17 DIAGNOSIS — Z7901 Long term (current) use of anticoagulants: Secondary | ICD-10-CM | POA: Diagnosis not present

## 2024-02-17 DIAGNOSIS — K8020 Calculus of gallbladder without cholecystitis without obstruction: Secondary | ICD-10-CM | POA: Diagnosis not present

## 2024-02-17 DIAGNOSIS — I10 Essential (primary) hypertension: Secondary | ICD-10-CM | POA: Diagnosis present

## 2024-02-17 DIAGNOSIS — G43109 Migraine with aura, not intractable, without status migrainosus: Secondary | ICD-10-CM | POA: Diagnosis not present

## 2024-02-17 DIAGNOSIS — F32A Depression, unspecified: Secondary | ICD-10-CM | POA: Insufficient documentation

## 2024-02-17 DIAGNOSIS — J4489 Other specified chronic obstructive pulmonary disease: Secondary | ICD-10-CM | POA: Diagnosis not present

## 2024-02-17 DIAGNOSIS — Z79899 Other long term (current) drug therapy: Secondary | ICD-10-CM | POA: Diagnosis not present

## 2024-02-17 DIAGNOSIS — Z1152 Encounter for screening for COVID-19: Secondary | ICD-10-CM | POA: Diagnosis not present

## 2024-02-17 DIAGNOSIS — R45851 Suicidal ideations: Secondary | ICD-10-CM | POA: Diagnosis not present

## 2024-02-17 DIAGNOSIS — R0902 Hypoxemia: Secondary | ICD-10-CM

## 2024-02-17 DIAGNOSIS — J449 Chronic obstructive pulmonary disease, unspecified: Secondary | ICD-10-CM | POA: Diagnosis present

## 2024-02-17 DIAGNOSIS — R0789 Other chest pain: Secondary | ICD-10-CM | POA: Diagnosis not present

## 2024-02-17 DIAGNOSIS — G4733 Obstructive sleep apnea (adult) (pediatric): Secondary | ICD-10-CM | POA: Insufficient documentation

## 2024-02-17 DIAGNOSIS — I2510 Atherosclerotic heart disease of native coronary artery without angina pectoris: Secondary | ICD-10-CM | POA: Diagnosis not present

## 2024-02-17 DIAGNOSIS — R0602 Shortness of breath: Secondary | ICD-10-CM | POA: Diagnosis present

## 2024-02-17 DIAGNOSIS — Z87891 Personal history of nicotine dependence: Secondary | ICD-10-CM | POA: Diagnosis not present

## 2024-02-17 DIAGNOSIS — F329 Major depressive disorder, single episode, unspecified: Secondary | ICD-10-CM | POA: Insufficient documentation

## 2024-02-17 DIAGNOSIS — I700 Atherosclerosis of aorta: Secondary | ICD-10-CM | POA: Insufficient documentation

## 2024-02-17 DIAGNOSIS — R0600 Dyspnea, unspecified: Secondary | ICD-10-CM

## 2024-02-17 DIAGNOSIS — F4323 Adjustment disorder with mixed anxiety and depressed mood: Secondary | ICD-10-CM

## 2024-02-17 LAB — CBC
HCT: 44.5 % (ref 36.0–46.0)
Hemoglobin: 12.9 g/dL (ref 12.0–15.0)
MCH: 22.6 pg — ABNORMAL LOW (ref 26.0–34.0)
MCHC: 29 g/dL — ABNORMAL LOW (ref 30.0–36.0)
MCV: 78.1 fL — ABNORMAL LOW (ref 80.0–100.0)
Platelets: 345 K/uL (ref 150–400)
RBC: 5.7 MIL/uL — ABNORMAL HIGH (ref 3.87–5.11)
RDW: 14.4 % (ref 11.5–15.5)
WBC: 12.7 K/uL — ABNORMAL HIGH (ref 4.0–10.5)
nRBC: 0 % (ref 0.0–0.2)

## 2024-02-17 LAB — BASIC METABOLIC PANEL WITH GFR
Anion gap: 12 (ref 5–15)
BUN: 22 mg/dL — ABNORMAL HIGH (ref 6–20)
CO2: 26 mmol/L (ref 22–32)
Calcium: 9.5 mg/dL (ref 8.9–10.3)
Chloride: 101 mmol/L (ref 98–111)
Creatinine, Ser: 1.77 mg/dL — ABNORMAL HIGH (ref 0.44–1.00)
GFR, Estimated: 33 mL/min — ABNORMAL LOW (ref >60)
Glucose, Bld: 101 mg/dL — ABNORMAL HIGH (ref 70–99)
Potassium: 3.5 mmol/L (ref 3.5–5.1)
Sodium: 138 mmol/L (ref 135–145)

## 2024-02-17 LAB — URINALYSIS, ROUTINE W REFLEX MICROSCOPIC
Bilirubin Urine: NEGATIVE
Glucose, UA: NEGATIVE mg/dL
Hgb urine dipstick: NEGATIVE
Ketones, ur: NEGATIVE mg/dL
Leukocytes,Ua: NEGATIVE
Nitrite: NEGATIVE
Protein, ur: 30 mg/dL — AB
Specific Gravity, Urine: 1.027 (ref 1.005–1.030)
pH: 5 (ref 5.0–8.0)

## 2024-02-17 LAB — HEPATIC FUNCTION PANEL
ALT: <5 U/L (ref 0–44)
AST: 14 U/L — ABNORMAL LOW (ref 15–41)
Albumin: 3.9 g/dL (ref 3.5–5.0)
Alkaline Phosphatase: 113 U/L (ref 38–126)
Bilirubin, Direct: 0.2 mg/dL (ref 0.0–0.2)
Indirect Bilirubin: 0.5 mg/dL (ref 0.3–0.9)
Total Bilirubin: 0.7 mg/dL (ref 0.0–1.2)
Total Protein: 7.8 g/dL (ref 6.5–8.1)

## 2024-02-17 LAB — RESP PANEL BY RT-PCR (RSV, FLU A&B, COVID)  RVPGX2
Influenza A by PCR: NEGATIVE
Influenza B by PCR: NEGATIVE
Resp Syncytial Virus by PCR: NEGATIVE
SARS Coronavirus 2 by RT PCR: NEGATIVE

## 2024-02-17 MED ORDER — FENTANYL CITRATE PF 50 MCG/ML IJ SOSY
50.0000 ug | PREFILLED_SYRINGE | Freq: Once | INTRAMUSCULAR | Status: AC
  Administered 2024-02-17: 50 ug via INTRAVENOUS
  Filled 2024-02-17: qty 1

## 2024-02-17 MED ORDER — IPRATROPIUM-ALBUTEROL 0.5-2.5 (3) MG/3ML IN SOLN
3.0000 mL | Freq: Once | RESPIRATORY_TRACT | Status: AC
  Administered 2024-02-17: 3 mL via RESPIRATORY_TRACT
  Filled 2024-02-17: qty 3

## 2024-02-17 MED ORDER — ONDANSETRON HCL 4 MG/2ML IJ SOLN
4.0000 mg | Freq: Once | INTRAMUSCULAR | Status: AC
  Administered 2024-02-17: 4 mg via INTRAVENOUS
  Filled 2024-02-17: qty 2

## 2024-02-17 NOTE — ED Provider Notes (Signed)
 [place] [facility]MENT AT [facility] Provider Note   CSN: [id number] Arrival date & time: 02/17/24  8176     Patient presents with: Shortness of Breath   Janice [name] is a 58 y.o. female.    Shortness of Breath Presents with shortness of breath.  Right flank pain.  Has had a cough.  States she coughs and then vomits up phlegm.  No fevers.  No dysuria.  History of COPD.  States she has an inhaler but did not bring it with her.  Has been feeling bad for last couple days.  No diarrhea.  No fevers.    Past Medical History:  Diagnosis Date   Chest pain 10/04/2007   Esophageal reflux 08/02/2006   Essential hypertension, benign 08/02/2006   Generalized anxiety disorder    Hypertensive urgency 07/10/2022   Leukocytosis 05/26/2022   Major depressive disorder 05/31/2021   Migraine with aura and without status migrainosus, not intractable 05/31/2021   Morbid obesity with body mass index (BMI) of 40.0 to 44.9 in adult 12/13/2021   OSA (obstructive sleep apnea) 06/01/2022   NPSG 07/04/22- AHI 63.4/hr, desaturation to 66%/mean 89.7%, body      Pap smear abnormality of cervix/human papillomavirus (HPV) positive 11/06/2019   Perimenopausal 03/06/2019   Polycythemia, secondary 05/26/2022   Prediabetes 04/04/2019   Primary osteoarthritis of right knee 12/13/2021   Psoriasis 03/06/2019   Stage 3b chronic kidney disease 05/03/2022   Trichomonas infection 11/06/2019   Vitamin D  deficiency 09/29/2015    Prior to Admission medications   Medication Sig Start Date End Date Taking? Authorizing Provider  albuterol  (VENTOLIN  HFA) 108 (90 Base) MCG/ACT inhaler Inhale 2 puffs into the lungs every 6 (six) hours as needed for wheezing or shortness of breath. 10/23/23  Yes Young, Clinton D, MD  amLODipine  (NORVASC ) 10 MG tablet Take 1 tablet (10 mg total) by mouth once nightly at bedtime. 06/22/23  Yes Vicci Barnie NOVAK, MD  buPROPion  (WELLBUTRIN  XL) 150 MG 24 hr tablet Take 1  tablet (150 mg total) by mouth daily. 11/14/23  Yes   labetalol  (NORMODYNE ) 100 MG tablet Take 1.5 tablets (150 mg total) by mouth 2 (two) times daily. 08/16/23  Yes Vannie Reche RAMAN, NP  pantoprazole  (PROTONIX ) 20 MG tablet Take 1 tablet (20 mg total) by mouth daily. 12/17/23  Yes Vicci Barnie NOVAK, MD  polyethylene glycol powder (GLYCOLAX /MIRALAX ) 17 GM/SCOOP powder Mix 1 capful (17 g) in 4-8 ounces in water/juice by mouth daily as needed (for constipation). 06/22/23  Yes Vicci Barnie NOVAK, MD  sertraline  (ZOLOFT ) 100 MG tablet Take 1 tablet (100 mg total) by mouth daily. 11/14/23  Yes   spironolactone  (ALDACTONE ) 25 MG tablet Take 1 tablet (25 mg total) by mouth daily. 06/22/23  Yes Vicci Barnie NOVAK, MD  traMADol  (ULTRAM ) 50 MG tablet Take 50 mg by mouth every 12 (twelve) hours as needed.   Yes [provider]  traZODone  (DESYREL ) 50 MG tablet Take 1 tablet (50 mg total) by mouth at bedtime. 11/14/23  Yes   Vitamin D , Ergocalciferol , (DRISDOL ) 1.25 MG (50000 UNIT) CAPS capsule Take 1 capsule (50,000 Units total) by mouth once a week. 08/16/23  Yes   clobetasol  ointment (TEMOVATE ) 0.05 % Apply to the affected areas on the lower extremities and elbows 1-2 times daily as needed. Patient not taking: Reported on 02/17/2024 11/23/23     conjugated estrogens  (PREMARIN ) vaginal cream Place 1 Applicatorful vaginally once a week. Patient not taking: Reported on 02/17/2024 05/01/23  Zina Jerilynn LABOR, MD  famotidine  (PEPCID ) 20 MG tablet Take 1 tablet (20 mg total) by mouth daily as needed for heartburn or indigestion. Patient not taking: Reported on 02/17/2024 06/26/23   Leigh Elspeth SQUIBB, MD  Fremanezumab -vfrm (AJOVY ) 225 MG/1.5ML SOAJ Inject 225 mg into the skin every 28 (twenty-eight) days. 02/15/24   Skeet Juliene SAUNDERS, DO  sertraline  (ZOLOFT ) 50 MG tablet Take 0.5 tablets (25 mg total) by mouth daily for 6 days, THEN 1 tablet (50 mg total) daily for 27 days. Patient not taking: No sig reported 10/17/23  11/24/23    Ubrogepant  (UBRELVY ) 100 MG TABS Take 1 tablet (100 mg total) by mouth as needed. May repeat after 2 hours.  Maximum 2 tablets in 24 hours. Patient not taking: Reported on 02/17/2024 01/21/24   Skeet Juliene SAUNDERS, DO  Ubrogepant  (UBRELVY ) 100 MG TABS Medication Samples have been provided to the patient.  Drug name: Ubrelvy        Strength: 100mg         Qty: 4 boxes  LOT: [id number]  Exp.Date: 05/2025  Dosing instructions:   The patient has been instructed regarding the correct time, dose, and frequency of taking this medication, including desired effects and most common side effects.   [name] 2:43 PM 01/21/2024 Patient not taking: Reported on 02/17/2024 01/21/24   Skeet Juliene SAUNDERS, DO    Allergies: Losartan  and Neomycin sulfate    Review of Systems  Respiratory:  Positive for shortness of breath.     Updated Vital Signs BP (!) 157/109 (BP Location: Right Arm)   Pulse 99   Temp 98.3 F (36.8 C)   Resp (!) 24   Ht 5' (1.524 m)   Wt 101.6 kg   LMP 07/20/2016   SpO2 95%   BMI 43.75 kg/m   Physical Exam Vitals reviewed.  HENT:     Head: Atraumatic.  Pulmonary:     Breath sounds: No wheezing, rhonchi or rales.  Chest:     Chest wall: Tenderness present.     Comments: Some tenderness to right lateral lower chest wall. Abdominal:     Tenderness: There is no abdominal tenderness.  Musculoskeletal:     Cervical back: Neck supple.     Right lower leg: No edema.     Left lower leg: No edema.  Skin:    Capillary Refill: Capillary refill takes less than 2 seconds.  Neurological:     Mental Status: She is alert.     (all labs ordered are listed, but only abnormal results are displayed) Labs Reviewed  BASIC METABOLIC PANEL WITH GFR - Abnormal; Notable for the following components:      Result Value   Glucose, Bld 101 (*)    BUN 22 (*)    Creatinine, Ser 1.77 (*)    GFR, Estimated 33 (*)    All other components within normal limits  CBC - Abnormal; Notable for the  following components:   WBC 12.7 (*)    RBC 5.70 (*)    MCV 78.1 (*)    MCH 22.6 (*)    MCHC 29.0 (*)    All other components within normal limits  HEPATIC FUNCTION PANEL - Abnormal; Notable for the following components:   AST 14 (*)    All other components within normal limits  RESP PANEL BY RT-PCR (RSV, FLU A&B, COVID)  [id number]  URINALYSIS, ROUTINE W REFLEX MICROSCOPIC    EKG: None  Radiology: DG Chest 2 View Result Date: 02/17/2024 CLINICAL  DATA:  Shortness of breath EXAM: CHEST - 2 VIEW COMPARISON:  Chest x-ray 11/11/2014 FINDINGS: Heart is enlarged. The lungs are clear. There is no pleural effusion or pneumothorax. No acute fractures are seen. IMPRESSION: Cardiomegaly. No acute cardiopulmonary process. Electronically Signed   By: Greig Pique M.D.   On: 02/17/2024 19:50     Procedures   Medications Ordered in the ED  fentaNYL  (SUBLIMAZE ) injection 50 mcg (has no administration in time range)  ondansetron  (ZOFRAN ) injection 4 mg (4 mg Intravenous Given 02/17/24 2025)  ipratropium-albuterol  (DUONEB) 0.5-2.5 (3) MG/3ML nebulizer solution 3 mL (3 mLs Nebulization Given 02/17/24 2022)                                    Medical Decision Making Amount and/or Complexity of Data Reviewed Labs: ordered. Radiology: ordered.  Risk Prescription drug management.   Patient with URI symptoms.  Cough.  However does have right flank pain 2.  States has been coughing up sputum.  Differential diagnose includes pneumonia, bronchitis.  Also with flank pain causes such as UTI.  Will get x-ray blood work and give breathing treatment.  Will give antiemetic.  Breathing has improved.  Will give some pain medicine and pending urine at this time.  Care turned over to oncoming provider.       Final diagnoses:  Flank pain  Dyspnea, unspecified type    ED Discharge Orders     None          Patsey Lot, MD 02/17/24 2220

## 2024-02-17 NOTE — ED Triage Notes (Signed)
 Patient to ED by POV with c/o SOB, headaches and vomiting x3 days. Reports HX of COPD.

## 2024-02-18 ENCOUNTER — Other Ambulatory Visit: Payer: Self-pay

## 2024-02-18 ENCOUNTER — Emergency Department (HOSPITAL_COMMUNITY)

## 2024-02-18 ENCOUNTER — Observation Stay (HOSPITAL_BASED_OUTPATIENT_CLINIC_OR_DEPARTMENT_OTHER)

## 2024-02-18 ENCOUNTER — Encounter (HOSPITAL_COMMUNITY): Payer: Self-pay | Admitting: Internal Medicine

## 2024-02-18 DIAGNOSIS — R072 Precordial pain: Secondary | ICD-10-CM

## 2024-02-18 DIAGNOSIS — I10 Essential (primary) hypertension: Secondary | ICD-10-CM

## 2024-02-18 DIAGNOSIS — R079 Chest pain, unspecified: Secondary | ICD-10-CM

## 2024-02-18 DIAGNOSIS — G4733 Obstructive sleep apnea (adult) (pediatric): Secondary | ICD-10-CM | POA: Diagnosis not present

## 2024-02-18 DIAGNOSIS — J449 Chronic obstructive pulmonary disease, unspecified: Secondary | ICD-10-CM

## 2024-02-18 LAB — CBC
HCT: 39.6 % (ref 36.0–46.0)
Hemoglobin: 11.4 g/dL — ABNORMAL LOW (ref 12.0–15.0)
MCH: 22.8 pg — ABNORMAL LOW (ref 26.0–34.0)
MCHC: 28.8 g/dL — ABNORMAL LOW (ref 30.0–36.0)
MCV: 79.2 fL — ABNORMAL LOW (ref 80.0–100.0)
Platelets: 315 K/uL (ref 150–400)
RBC: 5 MIL/uL (ref 3.87–5.11)
RDW: 14.6 % (ref 11.5–15.5)
WBC: 12.1 K/uL — ABNORMAL HIGH (ref 4.0–10.5)
nRBC: 0 % (ref 0.0–0.2)

## 2024-02-18 LAB — D-DIMER, QUANTITATIVE: D-Dimer, Quant: 0.61 ug{FEU}/mL — ABNORMAL HIGH (ref 0.00–0.50)

## 2024-02-18 LAB — TROPONIN T, HIGH SENSITIVITY
Troponin T High Sensitivity: <15 ng/L (ref 0–19)
Troponin T High Sensitivity: <15 ng/L (ref 0–19)

## 2024-02-18 LAB — ECHOCARDIOGRAM COMPLETE
Area-P 1/2: 2.5 cm2
Height: 60 in
S' Lateral: 2.9 cm
Weight: 3499.14 [oz_av]

## 2024-02-18 LAB — HIV ANTIBODY (ROUTINE TESTING W REFLEX): HIV Screen 4th Generation wRfx: NONREACTIVE

## 2024-02-18 LAB — CREATININE, SERUM
Creatinine, Ser: 1.46 mg/dL — ABNORMAL HIGH (ref 0.44–1.00)
GFR, Estimated: 41 mL/min — ABNORMAL LOW (ref >60)

## 2024-02-18 MED ORDER — ASPIRIN 81 MG PO TBEC
81.0000 mg | DELAYED_RELEASE_TABLET | Freq: Every day | ORAL | Status: DC
  Administered 2024-02-18 – 2024-02-21 (×4): 81 mg via ORAL
  Filled 2024-02-18 (×4): qty 1

## 2024-02-18 MED ORDER — IPRATROPIUM-ALBUTEROL 0.5-2.5 (3) MG/3ML IN SOLN
3.0000 mL | Freq: Once | RESPIRATORY_TRACT | Status: AC
  Administered 2024-02-18: 3 mL via RESPIRATORY_TRACT
  Filled 2024-02-18: qty 3

## 2024-02-18 MED ORDER — SPIRONOLACTONE 25 MG PO TABS
25.0000 mg | ORAL_TABLET | Freq: Every day | ORAL | Status: DC
  Administered 2024-02-18 – 2024-02-21 (×4): 25 mg via ORAL
  Filled 2024-02-18 (×4): qty 1

## 2024-02-18 MED ORDER — SERTRALINE HCL 100 MG PO TABS
100.0000 mg | ORAL_TABLET | Freq: Every day | ORAL | 0 refills | Status: DC
  Filled 2024-02-18: qty 90, 90d supply, fill #0

## 2024-02-18 MED ORDER — PANTOPRAZOLE SODIUM 40 MG PO TBEC
40.0000 mg | DELAYED_RELEASE_TABLET | Freq: Every day | ORAL | Status: DC
  Administered 2024-02-19 – 2024-02-21 (×3): 40 mg via ORAL
  Filled 2024-02-18 (×3): qty 1

## 2024-02-18 MED ORDER — DOCUSATE SODIUM 100 MG PO CAPS
100.0000 mg | ORAL_CAPSULE | Freq: Two times a day (BID) | ORAL | Status: DC
  Administered 2024-02-18 – 2024-02-21 (×6): 100 mg via ORAL
  Filled 2024-02-18 (×6): qty 1

## 2024-02-18 MED ORDER — AMLODIPINE BESYLATE 10 MG PO TABS
10.0000 mg | ORAL_TABLET | Freq: Every day | ORAL | Status: DC
  Administered 2024-02-18 – 2024-02-20 (×3): 10 mg via ORAL
  Filled 2024-02-18 (×3): qty 1

## 2024-02-18 MED ORDER — TRAZODONE HCL 50 MG PO TABS
50.0000 mg | ORAL_TABLET | Freq: Every day | ORAL | 0 refills | Status: DC
  Filled 2024-02-18: qty 90, 90d supply, fill #0

## 2024-02-18 MED ORDER — IOHEXOL 350 MG/ML SOLN
75.0000 mL | Freq: Once | INTRAVENOUS | Status: AC | PRN
  Administered 2024-02-18: 75 mL via INTRAVENOUS

## 2024-02-18 MED ORDER — PANTOPRAZOLE SODIUM 20 MG PO TBEC
20.0000 mg | DELAYED_RELEASE_TABLET | Freq: Every day | ORAL | Status: DC
  Administered 2024-02-18: 20 mg via ORAL
  Filled 2024-02-18: qty 1

## 2024-02-18 MED ORDER — GUAIFENESIN-DM 100-10 MG/5ML PO SYRP
5.0000 mL | ORAL_SOLUTION | ORAL | Status: DC | PRN
  Administered 2024-02-18 – 2024-02-19 (×2): 5 mL via ORAL
  Filled 2024-02-18 (×2): qty 10

## 2024-02-18 MED ORDER — BUPROPION HCL ER (XL) 150 MG PO TB24
150.0000 mg | ORAL_TABLET | Freq: Every day | ORAL | Status: DC
  Administered 2024-02-18 – 2024-02-21 (×4): 150 mg via ORAL
  Filled 2024-02-18 (×4): qty 1

## 2024-02-18 MED ORDER — ALBUTEROL SULFATE HFA 108 (90 BASE) MCG/ACT IN AERS: 2.0000 | INHALATION_SPRAY | Freq: Four times a day (QID) | RESPIRATORY_TRACT | Status: DC | PRN

## 2024-02-18 MED ORDER — OXYCODONE HCL 5 MG PO TABS
5.0000 mg | ORAL_TABLET | ORAL | Status: DC | PRN
  Administered 2024-02-18 – 2024-02-21 (×7): 5 mg via ORAL
  Filled 2024-02-18 (×8): qty 1

## 2024-02-18 MED ORDER — ALBUTEROL SULFATE (2.5 MG/3ML) 0.083% IN NEBU: 2.5000 mg | INHALATION_SOLUTION | Freq: Four times a day (QID) | RESPIRATORY_TRACT | Status: DC | PRN

## 2024-02-18 MED ORDER — ENOXAPARIN SODIUM 40 MG/0.4ML IJ SOSY
40.0000 mg | PREFILLED_SYRINGE | INTRAMUSCULAR | Status: DC
  Administered 2024-02-18 – 2024-02-21 (×4): 40 mg via SUBCUTANEOUS
  Filled 2024-02-18 (×4): qty 0.4

## 2024-02-18 MED ORDER — FENTANYL CITRATE PF 50 MCG/ML IJ SOSY
25.0000 ug | PREFILLED_SYRINGE | Freq: Once | INTRAMUSCULAR | Status: AC
  Administered 2024-02-18: 25 ug via INTRAVENOUS
  Filled 2024-02-18: qty 1

## 2024-02-18 MED ORDER — LABETALOL HCL 100 MG PO TABS
150.0000 mg | ORAL_TABLET | Freq: Two times a day (BID) | ORAL | Status: DC
Start: 2024-02-18 — End: 2024-02-19
  Administered 2024-02-18 (×2): 150 mg via ORAL
  Filled 2024-02-18 (×2): qty 2

## 2024-02-18 MED ORDER — SERTRALINE HCL 100 MG PO TABS
100.0000 mg | ORAL_TABLET | Freq: Every day | ORAL | Status: DC
  Administered 2024-02-18 – 2024-02-21 (×4): 100 mg via ORAL
  Filled 2024-02-18 (×4): qty 1

## 2024-02-18 MED ORDER — METHYLPREDNISOLONE SODIUM SUCC 125 MG IJ SOLR
125.0000 mg | Freq: Once | INTRAMUSCULAR | Status: AC
Start: 2024-02-18 — End: 2024-02-18
  Administered 2024-02-18: 125 mg via INTRAVENOUS
  Filled 2024-02-18: qty 2

## 2024-02-18 MED ORDER — BUPROPION HCL ER (XL) 150 MG PO TB24
150.0000 mg | ORAL_TABLET | Freq: Every day | ORAL | 0 refills | Status: DC
  Filled 2024-02-18: qty 90, 90d supply, fill #0

## 2024-02-18 MED ORDER — TRAZODONE HCL 50 MG PO TABS
50.0000 mg | ORAL_TABLET | Freq: Every day | ORAL | Status: DC
  Administered 2024-02-18 – 2024-02-20 (×3): 50 mg via ORAL
  Filled 2024-02-18 (×3): qty 1

## 2024-02-18 MED ORDER — POLYETHYLENE GLYCOL 3350 17 G PO PACK
17.0000 g | PACK | Freq: Every day | ORAL | Status: DC
  Administered 2024-02-18 – 2024-02-20 (×2): 17 g via ORAL
  Filled 2024-02-18 (×4): qty 1

## 2024-02-18 MED ORDER — FENTANYL CITRATE PF 50 MCG/ML IJ SOSY
50.0000 ug | PREFILLED_SYRINGE | Freq: Once | INTRAMUSCULAR | Status: AC
  Administered 2024-02-18: 50 ug via INTRAVENOUS
  Filled 2024-02-18: qty 1

## 2024-02-18 MED ORDER — ACETAMINOPHEN 325 MG PO TABS
650.0000 mg | ORAL_TABLET | Freq: Four times a day (QID) | ORAL | Status: DC | PRN
  Administered 2024-02-18 – 2024-02-19 (×2): 650 mg via ORAL
  Filled 2024-02-18 (×2): qty 2

## 2024-02-18 MED ORDER — ACETAMINOPHEN 650 MG RE SUPP: 650.0000 mg | Freq: Four times a day (QID) | RECTAL | Status: DC | PRN

## 2024-02-18 MED ORDER — KETOROLAC TROMETHAMINE 30 MG/ML IJ SOLN
15.0000 mg | Freq: Once | INTRAMUSCULAR | Status: AC
  Administered 2024-02-18: 15 mg via INTRAVENOUS
  Filled 2024-02-18: qty 1

## 2024-02-18 NOTE — H&P (Signed)
 History and Physical    Janice [name] FMW:[id number] DOB: 1965/10/11 DOA: 02/17/2024  Patient coming from: Home.  Chief Complaint: Chest pain.  HPI: Janice [name] is a 58 y.o. female with history of hypertension, COPD, sleep apnea, chronic kidney disease stage III, migraine headaches presents to the ER with complaint of chest pain.  Patient states she has been having chest pain off and on for last 1 month which is present both on exertion and also at rest.  Pain is retrosternal and nonradiating.  In addition patient also has been experiencing some right flank pain last few days with no associated nausea vomiting or diarrhea or any burning micturition.  Denies any shortness of breath productive cough fever chills.  ED Course: In the ER EKG showed normal sinus rhythm troponins were negative.  CT angiogram of the chest shows features concerning for pulmonary hypertension and also aortic aneurysm and coronary calcifications.  CT abdomen was showing cholelithiasis.  Patient admitted for further management of chest pain.  Review of Systems: As per HPI, rest all negative.   Past Medical History:  Diagnosis Date   Chest pain 10/04/2007   Esophageal reflux 08/02/2006   Essential hypertension, benign 08/02/2006   Generalized anxiety disorder    Hypertensive urgency 07/10/2022   Leukocytosis 05/26/2022   Major depressive disorder 05/31/2021   Migraine with aura and without status migrainosus, not intractable 05/31/2021   Morbid obesity with body mass index (BMI) of 40.0 to 44.9 in adult 12/13/2021   OSA (obstructive sleep apnea) 06/01/2022   NPSG 07/04/22- AHI 63.4/hr, desaturation to 66%/mean 89.7%, body      Pap smear abnormality of cervix/human papillomavirus (HPV) positive 11/06/2019   Perimenopausal 03/06/2019   Polycythemia, secondary 05/26/2022   Prediabetes 04/04/2019   Primary osteoarthritis of right knee 12/13/2021   Psoriasis 03/06/2019   Stage 3b chronic kidney disease  05/03/2022   Trichomonas infection 11/06/2019   Vitamin D  deficiency 09/29/2015    Past Surgical History:  Procedure Laterality Date   CESAREAN SECTION     x2     reports that she has quit smoking. Her smoking use included cigarettes. She has been exposed to tobacco smoke. She has never used smokeless tobacco. She reports that she does not currently use alcohol. She reports that she does not use drugs.  Allergies  Allergen Reactions   Losartan  Other (See Comments)    Chest pain   Neomycin Sulfate Swelling, Rash and Other (See Comments)    Pain in ear, swelling and redness    Family History  Problem Relation Age of Onset   Hypertension Mother    Kidney disease Mother    Pancreatic cancer Father 29 - 32   Alcohol abuse Father    Depression Father    Drug abuse Father    Hypertension Father    Cancer Paternal Aunt        unknown type   Hypertension Maternal Grandmother    Stroke Maternal Grandmother    Dementia Maternal Grandmother    Alzheimer's disease Maternal Grandfather    Hypertension Daughter    Colon cancer Neg Hx    Esophageal cancer Neg Hx    Stomach cancer Neg Hx    Rectal cancer Neg Hx     Prior to Admission medications   Medication Sig Start Date End Date Taking? Authorizing Provider  albuterol  (VENTOLIN  HFA) 108 (90 Base) MCG/ACT inhaler Inhale 2 puffs into the lungs every 6 (six) hours as needed for wheezing or shortness of  breath. 10/23/23  Yes Young, Reggy D, MD  amLODipine  (NORVASC ) 10 MG tablet Take 1 tablet (10 mg total) by mouth once nightly at bedtime. 06/22/23  Yes Vicci Barnie NOVAK, MD  buPROPion  (WELLBUTRIN  XL) 150 MG 24 hr tablet Take 1 tablet (150 mg total) by mouth daily. 11/14/23  Yes   labetalol  (NORMODYNE ) 100 MG tablet Take 1.5 tablets (150 mg total) by mouth 2 (two) times daily. 08/16/23  Yes Vannie Reche RAMAN, NP  pantoprazole  (PROTONIX ) 20 MG tablet Take 1 tablet (20 mg total) by mouth daily. 12/17/23  Yes Vicci Barnie NOVAK, MD   polyethylene glycol powder (GLYCOLAX /MIRALAX ) 17 GM/SCOOP powder Mix 1 capful (17 g) in 4-8 ounces in water/juice by mouth daily as needed (for constipation). 06/22/23  Yes Vicci Barnie NOVAK, MD  sertraline  (ZOLOFT ) 100 MG tablet Take 1 tablet (100 mg total) by mouth daily. 11/14/23  Yes   spironolactone  (ALDACTONE ) 25 MG tablet Take 1 tablet (25 mg total) by mouth daily. 06/22/23  Yes Vicci Barnie NOVAK, MD  traMADol  (ULTRAM ) 50 MG tablet Take 50 mg by mouth every 12 (twelve) hours as needed.   Yes [provider]  traZODone  (DESYREL ) 50 MG tablet Take 1 tablet (50 mg total) by mouth at bedtime. 11/14/23  Yes   Vitamin D , Ergocalciferol , (DRISDOL ) 1.25 MG (50000 UNIT) CAPS capsule Take 1 capsule (50,000 Units total) by mouth once a week. 08/16/23  Yes   clobetasol  ointment (TEMOVATE ) 0.05 % Apply to the affected areas on the lower extremities and elbows 1-2 times daily as needed. Patient not taking: Reported on 02/17/2024 11/23/23     conjugated estrogens  (PREMARIN ) vaginal cream Place 1 Applicatorful vaginally once a week. Patient not taking: Reported on 02/17/2024 05/01/23   Zina Jerilynn LABOR, MD  famotidine  (PEPCID ) 20 MG tablet Take 1 tablet (20 mg total) by mouth daily as needed for heartburn or indigestion. Patient not taking: Reported on 02/17/2024 06/26/23   Leigh Elspeth SQUIBB, MD  Fremanezumab -vfrm (AJOVY ) 225 MG/1.5ML SOAJ Inject 225 mg into the skin every 28 (twenty-eight) days. 02/15/24   Skeet Juliene SAUNDERS, DO  sertraline  (ZOLOFT ) 50 MG tablet Take 0.5 tablets (25 mg total) by mouth daily for 6 days, THEN 1 tablet (50 mg total) daily for 27 days. Patient not taking: No sig reported 10/17/23 11/24/23    Ubrogepant  (UBRELVY ) 100 MG TABS Take 1 tablet (100 mg total) by mouth as needed. May repeat after 2 hours.  Maximum 2 tablets in 24 hours. Patient not taking: Reported on 02/17/2024 01/21/24   Skeet Juliene SAUNDERS, DO  Ubrogepant  (UBRELVY ) 100 MG TABS Medication Samples have been provided to the  patient.  Drug name: Ubrelvy        Strength: 100mg         Qty: 4 boxes  LOT: [id number]  Exp.Date: 05/2025  Dosing instructions:   The patient has been instructed regarding the correct time, dose, and frequency of taking this medication, including desired effects and most common side effects.   [name] 2:43 PM 01/21/2024 Patient not taking: Reported on 02/17/2024 01/21/24   Skeet Juliene SAUNDERS, DO    Physical Exam: Constitutional: Moderately built and nourished. Vitals:   02/18/24 0145 02/18/24 0245 02/18/24 0353 02/18/24 0400  BP:  (!) 139/95 (!) 153/103 (!) 167/99  Pulse: 69 64 64 64  Resp: 14 14 18 15   Temp:   98 F (36.7 C)   TempSrc:   Oral   SpO2: 94% 98% 98% 100%  Weight:  Height:       Eyes: Anicteric no pallor. ENMT: No discharge from the ears eyes nose or mouth. Neck: No mass felt.  No neck rigidity. Respiratory: No rhonchi or crepitations. Cardiovascular: S1-S2 heard. Abdomen: Soft nontender bowel sound present. Musculoskeletal: No edema. Skin: No rash. Neurologic: Alert awake oriented to time place and person.  Moves all extremities. Psychiatric: Appears normal.  Normal affect.   Labs on Admission: I have personally reviewed following labs and imaging studies  CBC: Recent Labs  Lab 02/17/24 2017  WBC 12.7*  HGB 12.9  HCT 44.5  MCV 78.1*  PLT 345   Basic Metabolic Panel: Recent Labs  Lab 02/17/24 2017  NA 138  K 3.5  CL 101  CO2 26  GLUCOSE 101*  BUN 22*  CREATININE 1.77*  CALCIUM  9.5   GFR: Estimated Creatinine Clearance: 36.7 mL/min (A) (by C-G formula based on SCr of 1.77 mg/dL (H)). Liver Function Tests: Recent Labs  Lab 02/17/24 2017  AST 14*  ALT <5  ALKPHOS 113  BILITOT 0.7  PROT 7.8  ALBUMIN 3.9   No results for input(s): LIPASE, AMYLASE in the last 168 hours. No results for input(s): AMMONIA in the last 168 hours. Coagulation Profile: No results for input(s): INR, PROTIME in the last 168 hours. Cardiac  Enzymes: No results for input(s): CKTOTAL, CKMB, CKMBINDEX, TROPONINI in the last 168 hours. BNP (last 3 results) No results for input(s): PROBNP in the last 8760 hours. HbA1C: No results for input(s): HGBA1C in the last 72 hours. CBG: No results for input(s): GLUCAP in the last 168 hours. Lipid Profile: No results for input(s): CHOL, HDL, LDLCALC, TRIG, CHOLHDL, LDLDIRECT in the last 72 hours. Thyroid  Function Tests: No results for input(s): TSH, T4TOTAL, FREET4, T3FREE, THYROIDAB in the last 72 hours. Anemia Panel: No results for input(s): VITAMINB12, FOLATE, FERRITIN, TIBC, IRON, RETICCTPCT in the last 72 hours. Urine analysis:    Component Value Date/Time   COLORURINE YELLOW 02/17/2024 2334   APPEARANCEUR CLOUDY (A) 02/17/2024 2334   LABSPEC 1.027 02/17/2024 2334   PHURINE 5.0 02/17/2024 2334   GLUCOSEU NEGATIVE 02/17/2024 2334   HGBUR NEGATIVE 02/17/2024 2334   BILIRUBINUR NEGATIVE 02/17/2024 2334   BILIRUBINUR small (A) 02/16/2023 1720   KETONESUR NEGATIVE 02/17/2024 2334   PROTEINUR 30 (A) 02/17/2024 2334   UROBILINOGEN 0.2 02/16/2023 1720   UROBILINOGEN 0.2 09/27/2015 1129   NITRITE NEGATIVE 02/17/2024 2334   LEUKOCYTESUR NEGATIVE 02/17/2024 2334   Sepsis Labs: @LABRCNTIP (procalcitonin:4,lacticidven:4) ) Recent Results (from the past 240 hours)  Resp panel by RT-PCR (RSV, Flu A&B, Covid) Anterior Nasal Swab     Status: None   Collection Time: 02/17/24  8:17 PM   Specimen: Anterior Nasal Swab  Result Value Ref Range Status   SARS Coronavirus 2 by RT PCR NEGATIVE NEGATIVE Final    Comment: (NOTE) SARS-CoV-2 target nucleic acids are NOT DETECTED.  The SARS-CoV-2 RNA is generally detectable in upper respiratory specimens during the acute phase of infection. The lowest concentration of SARS-CoV-2 viral copies this assay can detect is 138 copies/mL. A negative result does not preclude SARS-Cov-2 infection and should not  be used as the sole basis for treatment or other patient management decisions. A negative result may occur with  improper specimen collection/handling, submission of specimen other than nasopharyngeal swab, presence of viral mutation(s) within the areas targeted by this assay, and inadequate number of viral copies(<138 copies/mL). A negative result must be combined with clinical observations, patient history, and epidemiological information. The  expected result is Negative.  Fact Sheet for Patients:  BloggerCourse.com  Fact Sheet for Healthcare Providers:  SeriousBroker.it  This test is no t yet approved or cleared by the [place]  FDA and  has been authorized for detection and/or diagnosis of SARS-CoV-2 by FDA under an Emergency Use Authorization (EUA). This EUA will remain  in effect (meaning this test can be used) for the duration of the COVID-19 declaration under Section 564(b)(1) of the Act, 21 U.S.C.section 360bbb-3(b)(1), unless the authorization is terminated  or revoked sooner.       Influenza A by PCR NEGATIVE NEGATIVE Final   Influenza B by PCR NEGATIVE NEGATIVE Final    Comment: (NOTE) The Xpert Xpress SARS-CoV-2/FLU/RSV plus assay is intended as an aid in the diagnosis of influenza from Nasopharyngeal swab specimens and should not be used as a sole basis for treatment. Nasal washings and aspirates are unacceptable for Xpert Xpress SARS-CoV-2/FLU/RSV testing.  Fact Sheet for Patients: BloggerCourse.com  Fact Sheet for Healthcare Providers: SeriousBroker.it  This test is not yet approved or cleared by the [place]  FDA and has been authorized for detection and/or diagnosis of SARS-CoV-2 by FDA under an Emergency Use Authorization (EUA). This EUA will remain in effect (meaning this test can be used) for the duration of the COVID-19 declaration under Section  564(b)(1) of the Act, 21 U.S.C. section 360bbb-3(b)(1), unless the authorization is terminated or revoked.     Resp Syncytial Virus by PCR NEGATIVE NEGATIVE Final    Comment: (NOTE) Fact Sheet for Patients: BloggerCourse.com  Fact Sheet for Healthcare Providers: SeriousBroker.it  This test is not yet approved or cleared by the [place]  FDA and has been authorized for detection and/or diagnosis of SARS-CoV-2 by FDA under an Emergency Use Authorization (EUA). This EUA will remain in effect (meaning this test can be used) for the duration of the COVID-19 declaration under Section 564(b)(1) of the Act, 21 U.S.C. section 360bbb-3(b)(1), unless the authorization is terminated or revoked.  Performed at [facility], [address] [place], [place] [address]      Radiological Exams on Admission: CT Angio Chest PE W and/or Wo Contrast Result Date: 02/18/2024 EXAM: CTA of the Chest with contrast for PE 02/18/2024 02:22:21 AM TECHNIQUE: CTA of the chest was performed after the administration of intravenous contrast. Multiplanar reformatted images are provided for review. MIP images are provided for review. Automated exposure control, iterative reconstruction, and/or weight based adjustment of the mA/kV was utilized to reduce the radiation dose to as low as reasonably achievable. COMPARISON: None available. CLINICAL HISTORY: Pulmonary embolism (PE) suspected, low to intermediate prob, positive D-dimer. FINDINGS: PULMONARY ARTERIES: The central pulmonary arteries are enlarged in keeping with changes of pulmonary arterial hypertension. No pulmonary embolism. MEDIASTINUM: Moderate cardiomegaly. No pericardial effusion. Fusiform dilation of the ascending aorta measuring 4.0 cm in diameter. Descending thoracic aorta is of normal caliber. Recommend annual imaging followup by CTA or MRA. This recommendation follows 2010  ACCF/AHA/AATS/ACR/ASA/SCA/SCAI/SIR/STS/SVM guidelines for the diagnosis and management of patients with thoracic aortic disease. Circulation. 2010; 121: z733-z630. LYMPH NODES: No mediastinal, hilar or axillary lymphadenopathy. LUNGS AND PLEURA: Bibasilar atelectasis. No confluent pulmonary infiltrate. No pneumothorax or pleural effusion. UPPER ABDOMEN: Cholelithiasis without superimposed pericholecystic inflammatory change. No intra or extrahepatic biliary ductal dilation. No acute abnormality within the visualized upper abdomen. SOFT TISSUES AND BONES: Osseous structures are age appropriate. No acute bone abnormality. No lytic or blastic bone lesion. VASCULATURE: Moderate multivessel coronary artery calcification. Mild atherosclerotic calcification within the  thoracic aorta. IMPRESSION: 1. No pulmonary embolism. 2. Moderate cardiomegaly and central pulmonary artery enlargement, consistent with pulmonary arterial hypertension. 3. Fusiform dilation of the ascending aorta measuring 4.0 cm in diameter. Recommend annual imaging follow-up by CTA or MRA. 4. Moderate multivessel coronary artery calcification. raf score: Aortic atherosclerosis (ICD10-I70.0), Aortic aneurysm (ICD10-I71.9) Electronically signed by: Dorethia Molt MD 02/18/2024 02:46 AM EDT RP Workstation: [id number]   CT Renal Stone Study Result Date: 02/18/2024 CLINICAL DATA:  Flank pain EXAM: CT ABDOMEN AND PELVIS WITHOUT CONTRAST TECHNIQUE: Multidetector CT imaging of the abdomen and pelvis was performed following the standard protocol without IV contrast. RADIATION DOSE REDUCTION: This exam was performed according to the departmental dose-optimization program which includes automated exposure control, adjustment of the mA and/or kV according to patient size and/or use of iterative reconstruction technique. COMPARISON:  None Available. FINDINGS: Hepatobiliary: The gallbladder is well distended with multiple gallstones within. Liver is unremarkable with  the exception of a calcified cyst with medial aspect of the right lobe of the liver. Pancreas: Unremarkable. No pancreatic ductal dilatation or surrounding inflammatory changes. Spleen: Normal in size without focal abnormality. Adrenals/Urinary Tract: Adrenal glands are within normal limits. Kidneys demonstrate a normal appearance without renal calculi or obstructive change bladder is decompressed. Stomach/Bowel: Scattered diverticular change of the colon is noted without evidence of diverticulitis. The appendix is within normal limits. No obstructive or inflammatory changes of the small bowel or stomach are noted. Vascular/Lymphatic: Aortic atherosclerosis. No enlarged abdominal or pelvic lymph nodes. Reproductive: Uterus and bilateral adnexa are unremarkable. Other: No abdominal wall hernia or abnormality. No abdominopelvic ascites. Musculoskeletal: Degenerative changes of lumbar spine are noted. IMPRESSION: Cholelithiasis without complicating factors. Diverticulosis without diverticulitis. Electronically Signed   By: Oneil Devonshire M.D.   On: 02/18/2024 02:43   DG Chest 2 View Result Date: 02/17/2024 CLINICAL DATA:  Shortness of breath EXAM: CHEST - 2 VIEW COMPARISON:  Chest x-ray 11/11/2014 FINDINGS: Heart is enlarged. The lungs are clear. There is no pleural effusion or pneumothorax. No acute fractures are seen. IMPRESSION: Cardiomegaly. No acute cardiopulmonary process. Electronically Signed   By: Greig Pique M.D.   On: 02/17/2024 19:50    EKG: Independently reviewed.  Normal sinus rhythm.  Assessment/Plan Principal Problem:   Chest pain Active Problems:   Essential hypertension, benign   Migraine with aura and without status migrainosus, not intractable   Major depressive disorder   Stage 3b chronic kidney disease   OSA (obstructive sleep apnea)   COPD mixed type (HCC)    Chest pain -     patient has been having exertional and also nonexertional chest pain.  Given the coronary  calcification seen on the CAT scan with also patient having risk factors including uncontrolled hypertension and prior tobacco abuse will consult cardiology for further recommendations. Ascending aortic aneurysm will need follow-up as outpatient. Cholelithiasis appears to be asymptomatic.  Abdomen appears benign.  Will check ultrasound right upper quadrant. Hypertension on labetalol  amlodipine  and spironolactone . COPD not wheezing.  As needed albuterol . Depression on Wellbutrin  and Zoloft . Chronic kidney disease stage III creatinine at around baseline. History of migraine on Ubrelvy . Sleep apnea on CPAP at bedtime.  Since patient has chest pain will need close monitoring further workup and more than 2 midnight stay.   DVT prophylaxis: Lovenox . Code Status: Full code. Family Communication: Discussed with patient. Disposition Plan: Monitored bed. Consults called: Cardiology. Admission status: Observation.

## 2024-02-18 NOTE — TOC Initial Note (Signed)
 Transition of Care [facility]) - Initial/Assessment Note    Patient Details  Name: Janice [name] MRN: [id number] Date of Birth: July 15, 1965  [facility] of [facility]) CM/SW Contact:    Bascom Service, RN Phone Number: 02/18/2024, 3:02 PM  Clinical Narrative: d/c plan home.                  Expected Discharge Plan: Home/Self Care Barriers to Discharge: Continued Medical Work up   Patient Goals and CMS Choice Patient states their goals for this hospitalization and ongoing recovery are:: Home CMS Medicare.gov Compare Post Acute Care list provided to:: Patient Choice offered to / list presented to : Patient [place] ownership interest in [facility].provided to:: Patient    Expected Discharge Plan and Services                                              Prior Living Arrangements/Services                       Activities of Daily Living      Permission Sought/Granted                  Emotional Assessment              Admission diagnosis:  Hypoxia [R09.02] Flank pain [R10.9] Chest pain [R07.9] Dyspnea, unspecified type [R06.00] Patient Active Problem List   Diagnosis Date Noted   Chest pain 02/18/2024   Genetic testing 12/10/2023   COPD mixed type (HCC) 11/14/2023   MDD (major depressive disorder) 08/23/2023   Low grade squamous intraepithelial lesion (LGSIL) on biopsy of cervix 06/23/2023   Generalized anxiety disorder    OSA (obstructive sleep apnea) 06/01/2022   Polycythemia, secondary 05/26/2022   Leukocytosis 05/26/2022   Stage 3b chronic kidney disease 05/03/2022   Primary osteoarthritis of right knee 12/13/2021   Morbid obesity with body mass index (BMI) of 40.0 to 44.9 in adult 12/13/2021   Migraine with aura and without status migrainosus, not intractable 05/31/2021   Major depressive disorder 05/31/2021   Prediabetes 04/04/2019   Perimenopausal 03/06/2019   Psoriasis 03/06/2019   Vitamin D  deficiency 09/29/2015    Essential hypertension, benign 08/02/2006   Esophageal reflux 08/02/2006   PCP:  Vicci Barnie NOVAK, MD Pharmacy:   [facility] [address] ([place]), [place] - [address] [place] ([place]) [place] [address] Phone: [phone] Fax: [phone]  [facility] [address] [place] - [address] [place] [address] Phone: [phone] Fax: [phone]  [facility] - [facility] [address] [place] [address] Phone: [phone] Fax: [phone]     Social Drivers of Health (SDOH) Social History: SDOH Screenings   Food Insecurity: No Food Insecurity (08/23/2023)  Housing: Low Risk  (08/23/2023)  Transportation Needs: No Transportation Needs (08/23/2023)  Utilities: Not At Risk (08/23/2023)  Alcohol Screen: Low Risk  (08/23/2023)  Depression (PHQ2-9): High Risk (10/22/2023)  Financial Resource Strain: Low Risk  (09/29/2022)  Physical Activity: Insufficiently Active (09/29/2022)  Social Connections: Unknown (09/29/2022)  Stress: No Stress Concern Present (09/29/2022)  Tobacco Use: Medium Risk (02/18/2024)   SDOH Interventions:     Readmission Risk Interventions     No data to display

## 2024-02-18 NOTE — Progress Notes (Signed)
   02/18/24 2309  BiPAP/CPAP/SIPAP  BiPAP/CPAP/SIPAP Pt Type Adult  Reason BIPAP/CPAP not in use Other(comment) (PT refused for the night)

## 2024-02-18 NOTE — ED Notes (Signed)
 Patient's Spo2 dropped in 85-88%, places patient on 2L [place] oxygen

## 2024-02-18 NOTE — Progress Notes (Signed)
 Echocardiogram 2D Echocardiogram has been performed.  Tinnie FORBES Gosling RDCS 02/18/2024, 2:00 PM

## 2024-02-18 NOTE — ED Provider Notes (Signed)
 Patient care assumed from Dr. Patsey.  Here with shortness of breath, flank pain and cough.  Does have a history of COPD.  Chest x-ray without infiltrate. Urinalysis does have hematuria without infection.  Will proceed with imaging to rule out kidney stone.  CT shows evidence of cholelithiasis without cholecystitis.  Does show diverticulosis. No pulmonary embolism.  Does show cardiomegaly and central pulmonary enlargement with aortic aneurysm needing annual follow-up.  Patient developed chest pain since has been waiting.  Repeat EKG is normal sinus rhythm. Troponin is negative.  Aspirin  given.  Did become hypoxic on room air to the mid 80s.  Does wear oxygen at night but not during the day.  She is not wheezing on exam  Patient given bronchodilators and steroids given her hypoxia.  She does have OSA but does not wear oxygen during the day.  Given her cardiomegaly on CT scan there is some concern for CHF though BNP is not elevated.  Does have coronary calcifications.  Troponin is negative.  Will treat as COPD exacerbation for now.  Will also need chest pain rule out.  Admission discussed with Dr. Franky.   Carita Senior, MD 02/18/24 [phone]

## 2024-02-18 NOTE — Progress Notes (Signed)
 Patient seen and examined.  Admitted early morning hours by nighttime hospitalist.  On my exam she had no symptoms, however she is anxious about mediastinal pain coming back.  This is mostly related to exertion, mobility and posture and she also has history of GERD, and currently not on any scheduled treatment.  Tells me she cannot lay flat mostly because of the pain and also shortness of breath on lying flat.  Denies any leg edema or swelling.  In brief, 58 year old with history of hypertension, COPD, sleep apnea on CPAP, CKD stage IIIa, migraine headaches presented with retrosternal chest pain both typical atypical features, exertional pain.  Negative troponins and EKG.  CT angiogram with evidence of pulmonary hypertension and coronary calcifications.  Admitted with cardiology consultation.  Chest pain: Both typical and atypical features.  High risk of coronary artery disease.  Echocardiogram pending.  Patient has CKD stage IIIa.  Also received contrast last night.  Cardiology following.  Anticipate Lexiscan/ischemic evaluation or cardiac cath. Will increase dose of Protonix  to 40 mg daily.   Total time spent: 30 minutes.  Same-day admit.  No charge visit.

## 2024-02-18 NOTE — Consult Note (Addendum)
 Cardiology Consultation   Patient ID: Janice [name] MRN: [id number]; DOB: April 28, 1966  Admit date: 02/17/2024 Date of Consult: 02/18/2024  PCP:  Vicci Barnie NOVAK, MD   [facility] Providers Cardiologist:  None   Reche Finder, NP     Patient Profile: Janice [name] is a 58 y.o. female with a hx of HTN, CKD III, Vit D deficiency, morbid obesity, GERD, GAD, OSA on CPAP, who is being seen 02/18/2024 for the evaluation of chest pain at the request of Dr Charlene.  History of Present Illness: Janice [name] was seen by Ms Finder in March and May 2025 for HTN. No med changes at last visit, no c/o chest pain.   Pt was admitted overnight with chest pain on/off x > 3 months, moderate multi-vessel coronary calcification on CT, Cards asked to see.   Ms. Passe has been having chest pain for the last several months.  She does not remember exactly when it started, but it has been continuous at between a 5-8/10 since it started.  Things that make it worse include exercise or exertion of any type.  She will get severe pain in the grocery store to the point that she has to sit down and rest.  Things that make it better include taking deep breaths, sitting up and leaning forward, and having her head elevated.  She cannot lie flat, because then the pain is too severe.  She also gets a little relief from using her inhaler and using her CPAP.   Yesterday, the pain just got so severe that she came into the hospital.  Since being in the hospital, she has gotten something through her IV that helped her pain. Based on the records, this was fentanyl  IV.  She does not have chest wall tenderness.  There is no relation to meals.  She has not had nitro.   Past Medical History:  Diagnosis Date   Chest pain 10/04/2007   Esophageal reflux 08/02/2006   Essential hypertension, benign 08/02/2006   Generalized anxiety disorder    Hypertensive urgency 07/10/2022   Leukocytosis 05/26/2022   Major  depressive disorder 05/31/2021   Migraine with aura and without status migrainosus, not intractable 05/31/2021   Morbid obesity with body mass index (BMI) of 40.0 to 44.9 in adult 12/13/2021   OSA (obstructive sleep apnea) 06/01/2022   NPSG 07/04/22- AHI 63.4/hr, desaturation to 66%/mean 89.7%, body      Pap smear abnormality of cervix/human papillomavirus (HPV) positive 11/06/2019   Perimenopausal 03/06/2019   Polycythemia, secondary 05/26/2022   Prediabetes 04/04/2019   Primary osteoarthritis of right knee 12/13/2021   Psoriasis 03/06/2019   Stage 3b chronic kidney disease 05/03/2022   Trichomonas infection 11/06/2019   Vitamin D  deficiency 09/29/2015    Past Surgical History:  Procedure Laterality Date   CESAREAN SECTION     x2     Home Medications:  Prior to Admission medications   Medication Sig Start Date End Date Taking? Authorizing Provider  albuterol  (VENTOLIN  HFA) 108 (90 Base) MCG/ACT inhaler Inhale 2 puffs into the lungs every 6 (six) hours as needed for wheezing or shortness of breath. 10/23/23  Yes Young, Clinton D, MD  amLODipine  (NORVASC ) 10 MG tablet Take 1 tablet (10 mg total) by mouth once nightly at bedtime. 06/22/23  Yes Vicci Barnie NOVAK, MD  buPROPion  (WELLBUTRIN  XL) 150 MG 24 hr tablet Take 1 tablet (150 mg total) by mouth daily. 11/14/23  Yes   labetalol  (NORMODYNE ) 100 MG tablet Take 1.5  tablets (150 mg total) by mouth 2 (two) times daily. 08/16/23  Yes Vannie Reche RAMAN, NP  pantoprazole  (PROTONIX ) 20 MG tablet Take 1 tablet (20 mg total) by mouth daily. 12/17/23  Yes Vicci Barnie NOVAK, MD  polyethylene glycol powder (GLYCOLAX /MIRALAX ) 17 GM/SCOOP powder Mix 1 capful (17 g) in 4-8 ounces in water/juice by mouth daily as needed (for constipation). 06/22/23  Yes Vicci Barnie NOVAK, MD  sertraline  (ZOLOFT ) 100 MG tablet Take 1 tablet (100 mg total) by mouth daily. 11/14/23  Yes   spironolactone  (ALDACTONE ) 25 MG tablet Take 1 tablet (25 mg total) by mouth daily.  06/22/23  Yes Vicci Barnie NOVAK, MD  traMADol  (ULTRAM ) 50 MG tablet Take 50 mg by mouth every 12 (twelve) hours as needed.   Yes [provider]  traZODone  (DESYREL ) 50 MG tablet Take 1 tablet (50 mg total) by mouth at bedtime. 11/14/23  Yes   Vitamin D , Ergocalciferol , (DRISDOL ) 1.25 MG (50000 UNIT) CAPS capsule Take 1 capsule (50,000 Units total) by mouth once a week. 08/16/23  Yes   clobetasol  ointment (TEMOVATE ) 0.05 % Apply to the affected areas on the lower extremities and elbows 1-2 times daily as needed. Patient not taking: Reported on 02/17/2024 11/23/23     conjugated estrogens  (PREMARIN ) vaginal cream Place 1 Applicatorful vaginally once a week. Patient not taking: Reported on 02/17/2024 05/01/23   Zina Jerilynn LABOR, MD  famotidine  (PEPCID ) 20 MG tablet Take 1 tablet (20 mg total) by mouth daily as needed for heartburn or indigestion. Patient not taking: Reported on 02/17/2024 06/26/23   Leigh Elspeth SQUIBB, MD  Fremanezumab -vfrm (AJOVY ) 225 MG/1.5ML SOAJ Inject 225 mg into the skin every 28 (twenty-eight) days. 02/15/24   Skeet Juliene SAUNDERS, DO  sertraline  (ZOLOFT ) 50 MG tablet Take 0.5 tablets (25 mg total) by mouth daily for 6 days, THEN 1 tablet (50 mg total) daily for 27 days. Patient not taking: No sig reported 10/17/23 11/24/23    Ubrogepant  (UBRELVY ) 100 MG TABS Take 1 tablet (100 mg total) by mouth as needed. May repeat after 2 hours.  Maximum 2 tablets in 24 hours. Patient not taking: Reported on 02/17/2024 01/21/24   Skeet Juliene SAUNDERS, DO  Ubrogepant  (UBRELVY ) 100 MG TABS Medication Samples have been provided to the patient.  Drug name: Ubrelvy        Strength: 100mg         Qty: 4 boxes  LOT: [id number]  Exp.Date: 05/2025  Dosing instructions:   The patient has been instructed regarding the correct time, dose, and frequency of taking this medication, including desired effects and most common side effects.   [name] 2:43 PM 01/21/2024 Patient not taking: Reported on 02/17/2024  01/21/24   Skeet Juliene SAUNDERS, DO    Scheduled Meds:  amLODipine   10 mg Oral QHS   aspirin  EC  81 mg Oral Daily   buPROPion   150 mg Oral Daily   enoxaparin  (LOVENOX ) injection  40 mg Subcutaneous Q24H   labetalol   150 mg Oral BID   pantoprazole   20 mg Oral Daily   sertraline   100 mg Oral Daily   spironolactone   25 mg Oral Daily   traZODone   50 mg Oral QHS   Continuous Infusions:  PRN Meds: acetaminophen  **OR** acetaminophen , albuterol   Allergies:    Allergies  Allergen Reactions   Losartan  Other (See Comments)    Chest pain   Neomycin Sulfate Swelling, Rash and Other (See Comments)    Pain in ear, swelling and redness  Social History:   Social History   Socioeconomic History   Marital status: Single    Spouse name: Not on file   Number of children: 3   Years of education: 10   Highest education level: 10th grade  Occupational History   Occupation: Unemployed  Tobacco Use   Smoking status: Former    Types: Cigarettes    Passive exposure: Past   Smokeless tobacco: Never   Tobacco comments:    Pt states she quit smoking 9 months ago. AB, CMA 06-25-2023  Vaping Use   Vaping status: Never Used  Substance and Sexual Activity   Alcohol use: Not Currently    Comment: occ   Drug use: No   Sexual activity: Yes    Partners: Male    Birth control/protection: Condom    Comment: single  Other Topics Concern   Not on file  Social History Narrative   Right Handed    Social Drivers of Health   Financial Resource Strain: Low Risk  (09/29/2022)   Overall Financial Resource Strain (CARDIA)    Difficulty of Paying Living Expenses: Not very hard  Food Insecurity: No Food Insecurity (08/23/2023)   Hunger Vital Sign    Worried About Running Out of Food in the Last Year: Never true    Ran Out of Food in the Last Year: Never true  Transportation Needs: No Transportation Needs (08/23/2023)   PRAPARE - Administrator, Civil Service (Medical): No    Lack of  Transportation (Non-Medical): No  Physical Activity: Insufficiently Active (09/29/2022)   Exercise Vital Sign    Days of Exercise per Week: 2 days    Minutes of Exercise per Session: 40 min  Stress: No Stress Concern Present (09/29/2022)   [employer] of Occupational Health - Occupational Stress Questionnaire    Feeling of Stress : Only a little  Social Connections: Unknown (09/29/2022)   Social Connection and Isolation Panel    Frequency of Communication with Friends and Family: More than three times a week    Frequency of Social Gatherings with Friends and Family: More than three times a week    Attends Religious Services: 1 to 4 times per year    Active Member of [employer] or Organizations: No    Attends Banker Meetings: Never    Marital Status: Patient declined  Catering manager Violence: Not At Risk (08/23/2023)   Humiliation, Afraid, Rape, and Kick questionnaire    Fear of Current or Ex-Partner: No    Emotionally Abused: No    Physically Abused: No    Sexually Abused: No    Family History:   Family History  Problem Relation Age of Onset   Hypertension Mother    Kidney disease Mother    Pancreatic cancer Father 3 - 45   Alcohol abuse Father    Depression Father    Drug abuse Father    Hypertension Father    Cancer Paternal Aunt        unknown type   Hypertension Maternal Grandmother    Stroke Maternal Grandmother    Dementia Maternal Grandmother    Alzheimer's disease Maternal Grandfather    Hypertension Daughter    Colon cancer Neg Hx    Esophageal cancer Neg Hx    Stomach cancer Neg Hx    Rectal cancer Neg Hx      ROS:  Please see the history of present illness.  All other ROS reviewed and negative.     Physical  Exam/Data: Vitals:   02/18/24 0353 02/18/24 0400 02/18/24 0556 02/18/24 0623  BP: (!) 153/103 (!) 167/99 (!) 149/92 (!) 158/98  Pulse: 64 64 65 70  Resp: 18 15 19 20   Temp: 98 F (36.7 C)  97.6 F (36.4 C) 98.4 F (36.9 C)   TempSrc: Oral  Oral Oral  SpO2: 98% 100% 99% 97%  Weight:    99.2 kg  Height:    5' (1.524 m)   No intake or output data in the 24 hours ending 02/18/24 1010    02/18/2024    6:23 AM 02/17/2024    6:49 PM 01/21/2024    2:19 PM  Last 3 Weights  Weight (lbs) 218 lb 11.1 oz 224 lb 223 lb  Weight (kg) 99.2 kg 101.606 kg 101.152 kg     Body mass index is 42.71 kg/m.  General:  Well nourished, well developed female, in no acute distress HEENT: normal Neck: no JVD seen, but difficult to assess secondary to body habitus Vascular: No carotid bruits; Distal pulses 2+ bilaterally Cardiac:  normal S1, S2; RRR; no murmur, no rub Lungs: Few rales bases bilaterally, no wheezing, rhonchi, no chest wall tenderness Abd: soft, nontender, no hepatomegaly  Ext: no edema Musculoskeletal:  No deformities, BUE and BLE strength normal and equal Skin: warm and dry, psoriasis on right leg seen Neuro:  CNs 2-12 intact, no focal abnormalities noted Psych:  Normal affect   EKG:  The EKG was personally reviewed and demonstrates:  SR, HR 69, precordial leads w/ decreased amplitude and diffuse T wave flattening Telemetry:  Telemetry was personally reviewed and demonstrates: Sinus rhythm  Relevant CV Studies: ECHO: ordered   Laboratory Data: Troponin T: < 15 x 2   Chemistry Recent Labs  Lab 02/17/24 2017 02/18/24 0553  NA 138  --   K 3.5  --   CL 101  --   CO2 26  --   GLUCOSE 101*  --   BUN 22*  --   CREATININE 1.77* 1.46*  CALCIUM  9.5  --   GFRNONAA 33* 41*  ANIONGAP 12  --     Recent Labs  Lab 02/17/24 2017  PROT 7.8  ALBUMIN 3.9  AST 14*  ALT <5  ALKPHOS 113  BILITOT 0.7   Lipids No results for input(s): CHOL, TRIG, HDL, LABVLDL, LDLCALC, CHOLHDL in the last 168 hours.  Hematology Recent Labs  Lab 02/17/24 2017 02/18/24 0256  WBC 12.7* 12.1*  RBC 5.70* 5.00  HGB 12.9 11.4*  HCT 44.5 39.6  MCV 78.1* 79.2*  MCH 22.6* 22.8*  MCHC 29.0* 28.8*  RDW 14.4 14.6   PLT 345 315   Thyroid  No results for input(s): TSH, FREET4 in the last 168 hours.  BNPNo results for input(s): BNP, PROBNP in the last 168 hours.  DDimer  Recent Labs  Lab 02/17/24 2340  DDIMER 0.61*   Lab Results  Component Value Date   CHOL 181 05/01/2022   HDL 50 05/01/2022   LDLCALC 106 (H) 05/01/2022   TRIG 141 05/01/2022   CHOLHDL 3.6 05/01/2022     Radiology/Studies:  CT Angio Chest PE W and/or Wo Contrast Result Date: 02/18/2024 EXAM: CTA of the Chest with contrast for PE 02/18/2024 02:22:21 AM TECHNIQUE: CTA of the chest was performed after the administration of intravenous contrast. Multiplanar reformatted images are provided for review. MIP images are provided for review. Automated exposure control, iterative reconstruction, and/or weight based adjustment of the mA/kV was utilized to reduce the radiation dose  to as low as reasonably achievable. COMPARISON: None available. CLINICAL HISTORY: Pulmonary embolism (PE) suspected, low to intermediate prob, positive D-dimer. FINDINGS: PULMONARY ARTERIES: The central pulmonary arteries are enlarged in keeping with changes of pulmonary arterial hypertension. No pulmonary embolism. MEDIASTINUM: Moderate cardiomegaly. No pericardial effusion. Fusiform dilation of the ascending aorta measuring 4.0 cm in diameter. Descending thoracic aorta is of normal caliber. Recommend annual imaging followup by CTA or MRA. This recommendation follows 2010 ACCF/AHA/AATS/ACR/ASA/SCA/SCAI/SIR/STS/SVM guidelines for the diagnosis and management of patients with thoracic aortic disease. Circulation. 2010; 121: z733-z630. LYMPH NODES: No mediastinal, hilar or axillary lymphadenopathy. LUNGS AND PLEURA: Bibasilar atelectasis. No confluent pulmonary infiltrate. No pneumothorax or pleural effusion. UPPER ABDOMEN: Cholelithiasis without superimposed pericholecystic inflammatory change. No intra or extrahepatic biliary ductal dilation. No acute abnormality  within the visualized upper abdomen. SOFT TISSUES AND BONES: Osseous structures are age appropriate. No acute bone abnormality. No lytic or blastic bone lesion. VASCULATURE: Moderate multivessel coronary artery calcification. Mild atherosclerotic calcification within the thoracic aorta. IMPRESSION: 1. No pulmonary embolism. 2. Moderate cardiomegaly and central pulmonary artery enlargement, consistent with pulmonary arterial hypertension. 3. Fusiform dilation of the ascending aorta measuring 4.0 cm in diameter. Recommend annual imaging follow-up by CTA or MRA. 4. Moderate multivessel coronary artery calcification. raf score: Aortic atherosclerosis (ICD10-I70.0), Aortic aneurysm (ICD10-I71.9) Electronically signed by: Dorethia Molt MD 02/18/2024 02:46 AM EDT RP Workstation: [id number]   CT Renal Stone Study Result Date: 02/18/2024 CLINICAL DATA:  Flank pain EXAM: CT ABDOMEN AND PELVIS WITHOUT CONTRAST TECHNIQUE: Multidetector CT imaging of the abdomen and pelvis was performed following the standard protocol without IV contrast. RADIATION DOSE REDUCTION: This exam was performed according to the departmental dose-optimization program which includes automated exposure control, adjustment of the mA and/or kV according to patient size and/or use of iterative reconstruction technique. COMPARISON:  None Available. FINDINGS: Hepatobiliary: The gallbladder is well distended with multiple gallstones within. Liver is unremarkable with the exception of a calcified cyst with medial aspect of the right lobe of the liver. Pancreas: Unremarkable. No pancreatic ductal dilatation or surrounding inflammatory changes. Spleen: Normal in size without focal abnormality. Adrenals/Urinary Tract: Adrenal glands are within normal limits. Kidneys demonstrate a normal appearance without renal calculi or obstructive change bladder is decompressed. Stomach/Bowel: Scattered diverticular change of the colon is noted without evidence of  diverticulitis. The appendix is within normal limits. No obstructive or inflammatory changes of the small bowel or stomach are noted. Vascular/Lymphatic: Aortic atherosclerosis. No enlarged abdominal or pelvic lymph nodes. Reproductive: Uterus and bilateral adnexa are unremarkable. Other: No abdominal wall hernia or abnormality. No abdominopelvic ascites. Musculoskeletal: Degenerative changes of lumbar spine are noted. IMPRESSION: Cholelithiasis without complicating factors. Diverticulosis without diverticulitis. Electronically Signed   By: Oneil Devonshire M.D.   On: 02/18/2024 02:43   DG Chest 2 View Result Date: 02/17/2024 CLINICAL DATA:  Shortness of breath EXAM: CHEST - 2 VIEW COMPARISON:  Chest x-ray 11/11/2014 FINDINGS: Heart is enlarged. The lungs are clear. There is no pleural effusion or pneumothorax. No acute fractures are seen. IMPRESSION: Cardiomegaly. No acute cardiopulmonary process. Electronically Signed   By: Greig Pique M.D.   On: 02/17/2024 19:50     Assessment and Plan: Chest pain - She has typical features and that it gets worse with exertion - ECG is nonacute and cardiac enzymes are negative for MI - Most of her symptoms are atypical with 5/10 chest pain at a minimum for the last 3 months and symptoms improved with deep inspiration, sitting up and leaning  forward. - No rub or chest wall tenderness on exam - Moderate coronary calcifications on CT done with contrast - Discussed best test options with MD, would like to limit contrast with her decreased renal function and a contrast study yesterday - She is not a candidate for any kind of a treadmill test because of her chronic respiratory issues - MD advise if Lexi scan is the best option  2.  COPD - Patient only has a as needed inhaler, no ongoing medications - She hears herself wheeze regularly, but symptoms improve with albuterol  MDH - no wheezing now, no acute process on CXR or CT but has bibasilar ATX  3.  OSA on CPAP -  Since the pain got better 3 days prior to admission, she has not used her CPAP because she says it was making symptoms worse. - Restart when able  4. HTN - SBP 130s-150s on her home medications - Diastolic BP is elevated 95-109 - Home BP meds are amlodipine  10 mg daily, Aldactone  25 mg daily, and labetalol  150 mg twice daily - Discuss options with MD  Otherwise, per IM  Risk Assessment/Risk Scores:    TIMI Risk Score for Unstable Angina or Non-ST Elevation MI:   The patient's TIMI risk score is 2, which indicates a 8% risk of all cause mortality, new or recurrent myocardial infarction or need for urgent revascularization in the next 14 days.  For questions or updates, please contact [facility] Please consult [email or web address] for contact info under   Signed, Shona Shad, PA-C  02/18/2024 10:10 AM  History and all data above reviewed.  I personally took the history today, performed the physical exam and formulated the substantive portion of the assessment and plan.  I reviewed all relevant tests and studies. Patient examined.  I agree with the findings as above. The patient has had chest discomfort off and on.  Some of this discomfort has been mid chest and reproducible with palpation but she also describes various other discomforts some of them substernal.  She also had some pain around her kidney or lower right back.  She complains of hot flashes.  She has had some episodic palpitations and shortness of breath and nausea.  She came in and had no evidence of acute EKG changes and troponins have been unremarkable.  However, she does have coronary calcium  and cardiovascular risk factors.  She does not have previous cardiac workup.    The patient exam reveals COR:RRR, no rubs no murmurs  ,  Lungs: Clear  ,  Abd: Positive bowel sounds, no rebound no guarding, Ext No edema   .  All available labs, radiology testing, previous records reviewed. Agree with documented assessment and plan.  Chest pain:   The patient has anginal and nonanginal features.  she does have coronary calcium .  She needs evaluation to rule out obstructive coronary disease.  I like to avoid further contrast that she had some with her PE protocol CT.   I will order PET testing which could be done as an outpatient if we cannot arrange to have it done in the hospital.     Lynwood Nykeem Citro  1:15 PM  02/18/2024

## 2024-02-19 ENCOUNTER — Other Ambulatory Visit: Payer: Self-pay

## 2024-02-19 ENCOUNTER — Observation Stay (HOSPITAL_COMMUNITY): Admit: 2024-02-19

## 2024-02-19 ENCOUNTER — Other Ambulatory Visit (HOSPITAL_COMMUNITY)

## 2024-02-19 DIAGNOSIS — I10 Essential (primary) hypertension: Secondary | ICD-10-CM | POA: Diagnosis not present

## 2024-02-19 DIAGNOSIS — J449 Chronic obstructive pulmonary disease, unspecified: Secondary | ICD-10-CM | POA: Diagnosis not present

## 2024-02-19 DIAGNOSIS — R072 Precordial pain: Secondary | ICD-10-CM

## 2024-02-19 DIAGNOSIS — G4733 Obstructive sleep apnea (adult) (pediatric): Secondary | ICD-10-CM | POA: Diagnosis not present

## 2024-02-19 DIAGNOSIS — R079 Chest pain, unspecified: Secondary | ICD-10-CM | POA: Diagnosis not present

## 2024-02-19 LAB — LIPID PANEL
Cholesterol: 184 mg/dL (ref 0–200)
HDL: 34 mg/dL — ABNORMAL LOW (ref >40)
LDL Cholesterol: 120 mg/dL — ABNORMAL HIGH (ref 0–99)
Total CHOL/HDL Ratio: 5.4 ratio
Triglycerides: 148 mg/dL (ref <150)
VLDL: 30 mg/dL (ref 0–40)

## 2024-02-19 LAB — COMPREHENSIVE METABOLIC PANEL WITH GFR
ALT: 7 U/L (ref 0–44)
AST: 20 U/L (ref 15–41)
Albumin: 3.8 g/dL (ref 3.5–5.0)
Alkaline Phosphatase: 103 U/L (ref 38–126)
Anion gap: 19 — ABNORMAL HIGH (ref 5–15)
BUN: 30 mg/dL — ABNORMAL HIGH (ref 6–20)
CO2: 20 mmol/L — ABNORMAL LOW (ref 22–32)
Calcium: 9.6 mg/dL (ref 8.9–10.3)
Chloride: 97 mmol/L — ABNORMAL LOW (ref 98–111)
Creatinine, Ser: 1.56 mg/dL — ABNORMAL HIGH (ref 0.44–1.00)
GFR, Estimated: 38 mL/min — ABNORMAL LOW (ref >60)
Glucose, Bld: 120 mg/dL — ABNORMAL HIGH (ref 70–99)
Potassium: 4.1 mmol/L (ref 3.5–5.1)
Sodium: 136 mmol/L (ref 135–145)
Total Bilirubin: 0.3 mg/dL (ref 0.0–1.2)
Total Protein: 7.5 g/dL (ref 6.5–8.1)

## 2024-02-19 LAB — CBC
HCT: 38.9 % (ref 36.0–46.0)
Hemoglobin: 11.3 g/dL — ABNORMAL LOW (ref 12.0–15.0)
MCH: 23.1 pg — ABNORMAL LOW (ref 26.0–34.0)
MCHC: 29 g/dL — ABNORMAL LOW (ref 30.0–36.0)
MCV: 79.4 fL — ABNORMAL LOW (ref 80.0–100.0)
Platelets: 300 K/uL (ref 150–400)
RBC: 4.9 MIL/uL (ref 3.87–5.11)
RDW: 14.3 % (ref 11.5–15.5)
WBC: 15.7 K/uL — ABNORMAL HIGH (ref 4.0–10.5)
nRBC: 0 % (ref 0.0–0.2)

## 2024-02-19 MED ORDER — ATORVASTATIN CALCIUM 40 MG PO TABS
40.0000 mg | ORAL_TABLET | Freq: Every day | ORAL | Status: DC
  Administered 2024-02-19 – 2024-02-21 (×3): 40 mg via ORAL
  Filled 2024-02-19 (×3): qty 1

## 2024-02-19 MED ORDER — CALCIUM CARBONATE ANTACID 500 MG PO CHEW
1.0000 | CHEWABLE_TABLET | Freq: Three times a day (TID) | ORAL | Status: DC
  Administered 2024-02-19 – 2024-02-20 (×2): 200 mg via ORAL
  Filled 2024-02-19 (×5): qty 1

## 2024-02-19 MED ORDER — LABETALOL HCL 100 MG PO TABS
150.0000 mg | ORAL_TABLET | Freq: Two times a day (BID) | ORAL | Status: DC
  Administered 2024-02-19 – 2024-02-21 (×4): 150 mg via ORAL
  Filled 2024-02-19 (×4): qty 2

## 2024-02-19 NOTE — Progress Notes (Signed)
 PROGRESS NOTE    Janice [name]  FMW:[id number] DOB: [birth year] DOA: 02/17/2024 PCP: Vicci Barnie NOVAK, MD    Brief Narrative:  58 year old with history of hypertension, COPD, sleep apnea on CPAP, CKD stage IIIa, migraine headaches presented with retrosternal chest pain both typical atypical features, exertional pain. Negative troponins and EKG. CT angiogram with evidence of pulmonary hypertension and coronary calcifications. Admitted with cardiology consultation.   Subjective: Patient seen and examined along with cardiology team in the morning.  She had episodic pain but was comfortable on my exam.  Still needing oxygen.  Not wearing CPAP at night. Initially Lexiscan was planned and canceled. Later in the daytime, patient is very tearful.  She has suicidal thoughts and is scared to go home.  Assessment & Plan:   Chest pain: Both typical and atypical features.  High risk of coronary artery disease.  EKG subsequent EKG.  Troponins and subsequent troponins were nonischemic.  Echocardiogram without regional wall motion abnormality.  Followed by cardiology.  They recommend outpatient PET scan.  Patient cannot use NSAIDs because of CKD.  Will try some oxycodone  to relieve her pain. Has significant history of reflux. Will increase dose of Protonix  to 40 mg daily.  Essential hypertension: Blood pressure is stable on labetalol , amlodipine , aspirin  lactone  COPD: Without exacerbation.  As needed albuterol .  Depression with suicidal thoughts: Currently denies any suicidal homicidal ideations.  She is on Wellbutrin  and Zoloft  that is continued.  With her significant symptoms, will consult psychiatry.  CKD stage IIIb: At about baseline.  Monitor closely.  History of migraine: On Ubrelvy .  Sleep apnea: Supposed to be on CPAP at night.  Not wearing.    DVT prophylaxis: enoxaparin  (LOVENOX ) injection 40 mg Start: 02/18/24 1000   Code Status: Full code Family Communication: None at the  bedside Disposition Plan: Status is: Observation The patient will require care spanning > 2 midnights and should be moved to inpatient because: Persistently symptomatic     Consultants:  Cardiology  Procedures:  None  Antimicrobials:  None     Objective: Vitals:   02/18/24 1738 02/18/24 2151 02/19/24 0414 02/19/24 1305  BP: 134/84 (!) 148/95 (!) 142/82 (!) 170/107  Pulse: 71 73 65 83  Resp:  18 18 (!) 22  Temp: 98.2 F (36.8 C) 98.3 F (36.8 C) 97.6 F (36.4 C) 98.3 F (36.8 C)  TempSrc: Oral     SpO2: 97% 100% 100% 97%  Weight:      Height:       No intake or output data in the 24 hours ending 02/19/24 1415 Filed Weights   02/17/24 1849 02/18/24 0623  Weight: 101.6 kg 99.2 kg    Examination:  General exam: Appears calm but anxious. Respiratory system: Clear to auscultation. Respiratory effort normal. Cardiovascular system: S1 & S2 heard, RRR. No JVD, murmurs, rubs, gallops or clicks. No pedal edema. Gastrointestinal system: Abdomen is nondistended, soft and nontender. No organomegaly or masses felt. Normal bowel sounds heard. Central nervous system: Alert and oriented. No focal neurological deficits. Extremities: Symmetric 5 x 5 power. Skin: No rashes, lesions or ulcers Psychiatry: Judgement and insight appear normal.  Anxious mood.  Tearful.    Data Reviewed: I have personally reviewed following labs and imaging studies  CBC: Recent Labs  Lab 02/17/24 2017 02/18/24 0256 02/19/24 0444  WBC 12.7* 12.1* 15.7*  HGB 12.9 11.4* 11.3*  HCT 44.5 39.6 38.9  MCV 78.1* 79.2* 79.4*  PLT 345 315 300   Basic Metabolic Panel:  Recent Labs  Lab 02/17/24 2017 02/18/24 0553 02/19/24 0444  NA 138  --  136  K 3.5  --  4.1  CL 101  --  97*  CO2 26  --  20*  GLUCOSE 101*  --  120*  BUN 22*  --  30*  CREATININE 1.77* 1.46* 1.56*  CALCIUM  9.5  --  9.6   GFR: Estimated Creatinine Clearance: 41.1 mL/min (A) (by C-G formula based on SCr of 1.56 mg/dL  (H)). Liver Function Tests: Recent Labs  Lab 02/17/24 2017 02/19/24 0444  AST 14* 20  ALT <5 7  ALKPHOS 113 103  BILITOT 0.7 0.3  PROT 7.8 7.5  ALBUMIN 3.9 3.8   No results for input(s): LIPASE, AMYLASE in the last 168 hours. No results for input(s): AMMONIA in the last 168 hours. Coagulation Profile: No results for input(s): INR, PROTIME in the last 168 hours. Cardiac Enzymes: No results for input(s): CKTOTAL, CKMB, CKMBINDEX, TROPONINI in the last 168 hours. BNP (last 3 results) No results for input(s): PROBNP in the last 8760 hours. HbA1C: No results for input(s): HGBA1C in the last 72 hours. CBG: No results for input(s): GLUCAP in the last 168 hours. Lipid Profile: Recent Labs    02/19/24 0444  CHOL 184  HDL 34*  LDLCALC 120*  TRIG 148  CHOLHDL 5.4   Thyroid  Function Tests: No results for input(s): TSH, T4TOTAL, FREET4, T3FREE, THYROIDAB in the last 72 hours. Anemia Panel: No results for input(s): VITAMINB12, FOLATE, FERRITIN, TIBC, IRON, RETICCTPCT in the last 72 hours. Sepsis Labs: No results for input(s): PROCALCITON, LATICACIDVEN in the last 168 hours.  Recent Results (from the past 240 hours)  Resp panel by RT-PCR (RSV, Flu A&B, Covid) Anterior Nasal Swab     Status: None   Collection Time: 02/17/24  8:17 PM   Specimen: Anterior Nasal Swab  Result Value Ref Range Status   SARS Coronavirus 2 by RT PCR NEGATIVE NEGATIVE Final    Comment: (NOTE) SARS-CoV-2 target nucleic acids are NOT DETECTED.  The SARS-CoV-2 RNA is generally detectable in upper respiratory specimens during the acute phase of infection. The lowest concentration of SARS-CoV-2 viral copies this assay can detect is 138 copies/mL. A negative result does not preclude SARS-Cov-2 infection and should not be used as the sole basis for treatment or other patient management decisions. A negative result may occur with  improper specimen  collection/handling, submission of specimen other than nasopharyngeal swab, presence of viral mutation(s) within the areas targeted by this assay, and inadequate number of viral copies(<138 copies/mL). A negative result must be combined with clinical observations, patient history, and epidemiological information. The expected result is Negative.  Fact Sheet for Patients:  BloggerCourse.com  Fact Sheet for Healthcare Providers:  SeriousBroker.it  This test is no t yet approved or cleared by the [place]  FDA and  has been authorized for detection and/or diagnosis of SARS-CoV-2 by FDA under an Emergency Use Authorization (EUA). This EUA will remain  in effect (meaning this test can be used) for the duration of the COVID-19 declaration under Section 564(b)(1) of the Act, 21 U.S.C.section 360bbb-3(b)(1), unless the authorization is terminated  or revoked sooner.       Influenza A by PCR NEGATIVE NEGATIVE Final   Influenza B by PCR NEGATIVE NEGATIVE Final    Comment: (NOTE) The Xpert Xpress SARS-CoV-2/FLU/RSV plus assay is intended as an aid in the diagnosis of influenza from Nasopharyngeal swab specimens and should not be used as a sole  basis for treatment. Nasal washings and aspirates are unacceptable for Xpert Xpress SARS-CoV-2/FLU/RSV testing.  Fact Sheet for Patients: BloggerCourse.com  Fact Sheet for Healthcare Providers: SeriousBroker.it  This test is not yet approved or cleared by the [place]  FDA and has been authorized for detection and/or diagnosis of SARS-CoV-2 by FDA under an Emergency Use Authorization (EUA). This EUA will remain in effect (meaning this test can be used) for the duration of the COVID-19 declaration under Section 564(b)(1) of the Act, 21 U.S.C. section 360bbb-3(b)(1), unless the authorization is terminated or revoked.     Resp Syncytial  Virus by PCR NEGATIVE NEGATIVE Final    Comment: (NOTE) Fact Sheet for Patients: BloggerCourse.com  Fact Sheet for Healthcare Providers: SeriousBroker.it  This test is not yet approved or cleared by the [place]  FDA and has been authorized for detection and/or diagnosis of SARS-CoV-2 by FDA under an Emergency Use Authorization (EUA). This EUA will remain in effect (meaning this test can be used) for the duration of the COVID-19 declaration under Section 564(b)(1) of the Act, 21 U.S.C. section 360bbb-3(b)(1), unless the authorization is terminated or revoked.  Performed at [facility], [address] [place], [place] [address]          Radiology Studies: ECHOCARDIOGRAM COMPLETE Result Date: 02/18/2024    ECHOCARDIOGRAM REPORT   Patient Name:   Janice [name] Overfelt Date of Exam: 02/18/2024 Medical Rec #:  [id number]        Height:       60.0 in Accession #:    [id number]       Weight:       218.7 lb Date of Birth:  05-02-1966         BSA:          1.939 m Patient Age:    58 years         BP:           158/98 mmHg Patient Gender: F                HR:           71 bpm. Exam Location:  Inpatient Procedure: 2D Echo, Color Doppler and Cardiac Doppler (Both Spectral and Color            Flow Doppler were utilized during procedure). Indications:    Chest Pain R07.9  History:        Patient has no prior history of Echocardiogram examinations.  Sonographer:    Tinnie Gosling RDCS Referring Phys: [id number] ARSHAD N KAKRAKANDY IMPRESSIONS  1. Left ventricular ejection fraction, by estimation, is 60 to 65%. The left ventricle has normal function. Left ventricular endocardial border not optimally defined to evaluate regional wall motion. Left ventricular diastolic parameters are consistent with Grade I diastolic dysfunction (impaired relaxation).  2. Right ventricular systolic function is normal. The right ventricular size is normal.  3. The  mitral valve is normal in structure. No evidence of mitral valve regurgitation. No evidence of mitral stenosis.  4. The aortic valve is normal in structure. Aortic valve regurgitation is not visualized. No aortic stenosis is present.  5. The inferior vena cava is normal in size with greater than 50% respiratory variability, suggesting right atrial pressure of 3 mmHg. FINDINGS  Left Ventricle: Left ventricular ejection fraction, by estimation, is 60 to 65%. The left ventricle has normal function. Left ventricular endocardial border not optimally defined to evaluate regional wall motion. The left ventricular internal cavity size was normal  in size. There is no left ventricular hypertrophy. Left ventricular diastolic parameters are consistent with Grade I diastolic dysfunction (impaired relaxation). Right Ventricle: The right ventricular size is normal. No increase in right ventricular wall thickness. Right ventricular systolic function is normal. Left Atrium: Left atrial size was normal in size. Right Atrium: Right atrial size was normal in size. Pericardium: There is no evidence of pericardial effusion. Mitral Valve: The mitral valve is normal in structure. No evidence of mitral valve regurgitation. No evidence of mitral valve stenosis. Tricuspid Valve: The tricuspid valve is normal in structure. Tricuspid valve regurgitation is not demonstrated. No evidence of tricuspid stenosis. Aortic Valve: The aortic valve is normal in structure. Aortic valve regurgitation is not visualized. No aortic stenosis is present. Pulmonic Valve: The pulmonic valve was normal in structure. Pulmonic valve regurgitation is not visualized. No evidence of pulmonic stenosis. Aorta: The aortic root is normal in size and structure. Venous: The inferior vena cava is normal in size with greater than 50% respiratory variability, suggesting right atrial pressure of 3 mmHg. IAS/Shunts: No atrial level shunt detected by color flow Doppler.  LEFT  VENTRICLE PLAX 2D LVIDd:         4.60 cm   Diastology LVIDs:         2.90 cm   LV e' medial:    5.87 cm/s LV PW:         1.10 cm   LV E/e' medial:  10.2 LV IVS:        1.10 cm   LV e' lateral:   6.85 cm/s LVOT diam:     2.20 cm   LV E/e' lateral: 8.8 LV SV:         60 LV SV Index:   31 LVOT Area:     3.80 cm  RIGHT VENTRICLE RV S prime:     15.60 cm/s TAPSE (M-mode): 1.5 cm LEFT ATRIUM         Index LA diam:    3.70 cm 1.91 cm/m  AORTIC VALVE LVOT Vmax:   69.80 cm/s LVOT Vmean:  44.800 cm/s LVOT VTI:    0.158 m  AORTA Ao Root diam: 3.30 cm Ao Asc diam:  3.60 cm MITRAL VALVE MV Area (PHT): 2.50 cm    SHUNTS MV Decel Time: 304 msec    Systemic VTI:  0.16 m MV E velocity: 60.00 cm/s  Systemic Diam: 2.20 cm MV A velocity: 84.40 cm/s MV E/A ratio:  0.71 Aditya Sabharwal Electronically signed by Ria Commander Signature Date/Time: 02/18/2024/3:44:34 PM    Final    CT Angio Chest PE W and/or Wo Contrast Result Date: 02/18/2024 EXAM: CTA of the Chest with contrast for PE 02/18/2024 02:22:21 AM TECHNIQUE: CTA of the chest was performed after the administration of intravenous contrast. Multiplanar reformatted images are provided for review. MIP images are provided for review. Automated exposure control, iterative reconstruction, and/or weight based adjustment of the mA/kV was utilized to reduce the radiation dose to as low as reasonably achievable. COMPARISON: None available. CLINICAL HISTORY: Pulmonary embolism (PE) suspected, low to intermediate prob, positive D-dimer. FINDINGS: PULMONARY ARTERIES: The central pulmonary arteries are enlarged in keeping with changes of pulmonary arterial hypertension. No pulmonary embolism. MEDIASTINUM: Moderate cardiomegaly. No pericardial effusion. Fusiform dilation of the ascending aorta measuring 4.0 cm in diameter. Descending thoracic aorta is of normal caliber. Recommend annual imaging followup by CTA or MRA. This recommendation follows 2010  ACCF/AHA/AATS/ACR/ASA/SCA/SCAI/SIR/STS/SVM guidelines for the diagnosis and management of patients with thoracic aortic  disease. Circulation. 2010; 121: z733-z630. LYMPH NODES: No mediastinal, hilar or axillary lymphadenopathy. LUNGS AND PLEURA: Bibasilar atelectasis. No confluent pulmonary infiltrate. No pneumothorax or pleural effusion. UPPER ABDOMEN: Cholelithiasis without superimposed pericholecystic inflammatory change. No intra or extrahepatic biliary ductal dilation. No acute abnormality within the visualized upper abdomen. SOFT TISSUES AND BONES: Osseous structures are age appropriate. No acute bone abnormality. No lytic or blastic bone lesion. VASCULATURE: Moderate multivessel coronary artery calcification. Mild atherosclerotic calcification within the thoracic aorta. IMPRESSION: 1. No pulmonary embolism. 2. Moderate cardiomegaly and central pulmonary artery enlargement, consistent with pulmonary arterial hypertension. 3. Fusiform dilation of the ascending aorta measuring 4.0 cm in diameter. Recommend annual imaging follow-up by CTA or MRA. 4. Moderate multivessel coronary artery calcification. raf score: Aortic atherosclerosis (ICD10-I70.0), Aortic aneurysm (ICD10-I71.9) Electronically signed by: Dorethia Molt MD 02/18/2024 02:46 AM EDT RP Workstation: [id number]   CT Renal Stone Study Result Date: 02/18/2024 CLINICAL DATA:  Flank pain EXAM: CT ABDOMEN AND PELVIS WITHOUT CONTRAST TECHNIQUE: Multidetector CT imaging of the abdomen and pelvis was performed following the standard protocol without IV contrast. RADIATION DOSE REDUCTION: This exam was performed according to the departmental dose-optimization program which includes automated exposure control, adjustment of the mA and/or kV according to patient size and/or use of iterative reconstruction technique. COMPARISON:  None Available. FINDINGS: Hepatobiliary: The gallbladder is well distended with multiple gallstones within. Liver is unremarkable with  the exception of a calcified cyst with medial aspect of the right lobe of the liver. Pancreas: Unremarkable. No pancreatic ductal dilatation or surrounding inflammatory changes. Spleen: Normal in size without focal abnormality. Adrenals/Urinary Tract: Adrenal glands are within normal limits. Kidneys demonstrate a normal appearance without renal calculi or obstructive change bladder is decompressed. Stomach/Bowel: Scattered diverticular change of the colon is noted without evidence of diverticulitis. The appendix is within normal limits. No obstructive or inflammatory changes of the small bowel or stomach are noted. Vascular/Lymphatic: Aortic atherosclerosis. No enlarged abdominal or pelvic lymph nodes. Reproductive: Uterus and bilateral adnexa are unremarkable. Other: No abdominal wall hernia or abnormality. No abdominopelvic ascites. Musculoskeletal: Degenerative changes of lumbar spine are noted. IMPRESSION: Cholelithiasis without complicating factors. Diverticulosis without diverticulitis. Electronically Signed   By: Oneil Devonshire M.D.   On: 02/18/2024 02:43   DG Chest 2 View Result Date: 02/17/2024 CLINICAL DATA:  Shortness of breath EXAM: CHEST - 2 VIEW COMPARISON:  Chest x-ray 11/11/2014 FINDINGS: Heart is enlarged. The lungs are clear. There is no pleural effusion or pneumothorax. No acute fractures are seen. IMPRESSION: Cardiomegaly. No acute cardiopulmonary process. Electronically Signed   By: Greig Pique M.D.   On: 02/17/2024 19:50        Scheduled Meds:  amLODipine   10 mg Oral QHS   aspirin  EC  81 mg Oral Daily   atorvastatin   40 mg Oral Daily   buPROPion   150 mg Oral Daily   docusate sodium   100 mg Oral BID   enoxaparin  (LOVENOX ) injection  40 mg Subcutaneous Q24H   labetalol   150 mg Oral BID   pantoprazole   40 mg Oral Daily   polyethylene glycol  17 g Oral Daily   sertraline   100 mg Oral Daily   spironolactone   25 mg Oral Daily   traZODone   50 mg Oral QHS   Continuous  Infusions:   LOS: 0 days    Time spent: 40 minutes    Renato Applebaum, MD [facility]

## 2024-02-19 NOTE — Plan of Care (Signed)

## 2024-02-19 NOTE — Progress Notes (Addendum)
 Progress Note  Patient Name: Janice [name] Date of Encounter: 02/19/2024 [facility] Cardiologist: Lynwood Schilling, MD   Interval Summary   Reports she is feeling better today as she has been on O2, her chest pain is still ongoing 5/10 and worse with exertion.   Vital Signs Vitals:   02/18/24 1424 02/18/24 1738 02/18/24 2151 02/19/24 0414  BP: 129/80 134/84 (!) 148/95 (!) 142/82  Pulse: 73 71 73 65  Resp: 20  18 18   Temp: 98.1 F (36.7 C) 98.2 F (36.8 C) 98.3 F (36.8 C) 97.6 F (36.4 C)  TempSrc:  Oral    SpO2: 99% 97% 100% 100%  Weight:      Height:       No intake or output data in the 24 hours ending 02/19/24 0757    02/18/2024    6:23 AM 02/17/2024    6:49 PM 01/21/2024    2:19 PM  Last 3 Weights  Weight (lbs) 218 lb 11.1 oz 224 lb 223 lb  Weight (kg) 99.2 kg 101.606 kg 101.152 kg      Telemetry/ECG  Sinus rhythm HR 59 - Personally Reviewed  Physical Exam  GEN: No acute distress.   Cardiac: RRR, no murmurs, rubs, or gallops. Tender to chest wall with palpation.  Respiratory: Clear to auscultation bilaterally. GI: Soft, nontender, non-distended  MS: No edema  Assessment & Plan  Janice [name] is a 58 year old female with a past medical history of hypertension, COPD, CKD, GERD, GAD, morbid obesity, OSA on CPAP who was presented to the ED on 9/14 for shortness of breath with productive cough. While in the ED patient developed chest pain. ECG without signs of ischemia. Troponin negative. Cardiology was consulted.   Chest pain Reporting chest pain with exertion and palpation. Pain improves with pain medication, no nitroglycerin administered.  Moderate coronary calcifications on CT.  Echo this admission shows LVEF 60-65% with G1 DD.    Lipid panel this admission LDL 120   HDL 34 Will start lipitor 40 mg  COPD OSA on intermittent CPAP Patient reported on-compliance with CPAP leading into admission as it was making her symptoms worse. Patient did  not wear CPAP overnight. Per primary  Hypertension BP: 142/82 Continue amlodipine  10 mg Continue labetalol  150 mg Continue spironolactone  25 mg   Per primary GERD Morbid obesity CKD CAD    For questions or updates, please contact  [facility] Please consult [email or web address] for contact info under       Signed, Leontine LOISE Salen, PA-C    Spoke with Dr. Lindi who would recommend PET imaging outpatient. NM study was cancelled and all parties involved were notified.   Leontine Salen PA-C at 1100  History and all data above reviewed.  I personally took the history today, performed the physical exam and formulated the assessment and plan.   She is tearful.  She tells her pain has let up.  I reviewed all relevant tests and studies. Patient examined.  I agree with the findings as above.  The patient exam reveals COR:RRR  ,  Lungs: Clear  ,  Abd: Positive bowel sounds, no rebound no guarding, Ext Mild leg edema,  Chest:  Point tenderness lower sternum reproducible with palpation.    .  All available labs, radiology testing, previous records reviewed. Agree with documented assessment and plan.  Chest pain:  No objective evidence of ischemia.   Her pain is reproducible with palpation although there is a deep component as well.  Enzymes and EKG have been unremarkable.  Echo with NL wall motion.  I think the pretest probability of obstructive CAD as the etiology is low but I will exclude electively obstructive CAD.  She could not walk on a treadmill.  I want to avoid further contrast with her CKD.  Lexiscan Myoview would be prone to imaging artifact with her body habitus.  I would prefer a PET scan which we need to schedule.  Dyslipidemia:  Statin started.   Lynwood Antwyne Pingree  1:53 PM  02/19/2024

## 2024-02-19 NOTE — Progress Notes (Signed)
   02/19/24 2215  BiPAP/CPAP/SIPAP  BiPAP/CPAP/SIPAP Pt Type Adult  Reason BIPAP/CPAP not in use Non-compliant (Patient states she is only able to tolerate nasal pillows for her CPAP.  Discussed with the patient that she could bring her nasal pillows from home since we do not carry them but she prefers to sleep with her nasal cannula in.)

## 2024-02-20 ENCOUNTER — Other Ambulatory Visit: Payer: Self-pay

## 2024-02-20 DIAGNOSIS — I10 Essential (primary) hypertension: Secondary | ICD-10-CM | POA: Diagnosis not present

## 2024-02-20 DIAGNOSIS — R072 Precordial pain: Secondary | ICD-10-CM | POA: Diagnosis not present

## 2024-02-20 DIAGNOSIS — F4323 Adjustment disorder with mixed anxiety and depressed mood: Secondary | ICD-10-CM

## 2024-02-20 DIAGNOSIS — R45851 Suicidal ideations: Secondary | ICD-10-CM

## 2024-02-20 NOTE — Progress Notes (Signed)
   02/20/24 2139  BiPAP/CPAP/SIPAP  BiPAP/CPAP/SIPAP Pt Type Adult  Reason BIPAP/CPAP not in use Non-compliant (Patient continues to refuse CPAP qhs.)

## 2024-02-20 NOTE — Progress Notes (Signed)
  Progress Note  Patient Name: Elliona Doddridge [place] Date of Encounter: 02/20/2024 [facility] Cardiologist: Lynwood Schilling, MD   Interval Summary   Reported her breathing and chest pain do feel better, though still bothersome. She shared that she is worried about going home due to the pain she is in from her right flank and her depression. Notified hospital team.  Vital Signs Vitals:   02/19/24 0414 02/19/24 1305 02/19/24 2146 02/20/24 0542  BP: (!) 142/82 (!) 170/107 127/71 95/71  Pulse: 65 83 66 63  Resp: 18 (!) 22 16 17   Temp: 97.6 F (36.4 C) 98.3 F (36.8 C) 98.1 F (36.7 C) 97.7 F (36.5 C)  TempSrc:      SpO2: 100% 97% 100% 97%  Weight:      Height:       No intake or output data in the 24 hours ending 02/20/24 0723    02/18/2024    6:23 AM 02/17/2024    6:49 PM 01/21/2024    2:19 PM  Last 3 Weights  Weight (lbs) 218 lb 11.1 oz 224 lb 223 lb  Weight (kg) 99.2 kg 101.606 kg 101.152 kg      Telemetry/ECG  Sinus rhythm HR 65 - Personally Reviewed  Physical Exam  GEN: No acute distress.   Cardiac: RRR, no murmurs, rubs, or gallops. Tender to palpation of the central chest Respiratory: Clear to auscultation bilaterally. MS: No edema  Assessment & Plan  Meghen Magadan is a 58 year old female with a past medical history of hypertension, COPD, CKD, GERD, GAD, morbid obesity, OSA on CPAP who was presented to the ED on 9/14 for shortness of breath with productive cough. While in the ED patient developed chest pain. ECG without signs of ischemia. Troponin negative. Cardiology was consulted.    Chest pain Reporting chest pain with exertion and palpation. Pain improves with pain medication.  Moderate coronary calcifications on CT.  Echo this admission shows LVEF 60-65% with G1 DD.   Given intermediate risk and renal function, planning to obtain outpatient PET scheduled for 9/24. Patient is aware of scheduled time.   Continue ASA 81 mg   Lipid panel this  admission LDL 120   HDL 34 Continue lipitor 40 mg  Hypertension BP: 95/71 Continue amlodipine  10 mg Continue labetalol  150 mg Continue spironolactone  25 mg   Per primary COPD OSA on intermittent CPAP Depression GERD Morbid obesity CKD  Cardiology follow-up scheduled for 03/19/24   For questions or updates, please contact [facility] Please consult [email or web address] for contact info under       Signed, Leontine LOISE Salen, PA-C

## 2024-02-20 NOTE — Progress Notes (Signed)
 PROGRESS NOTE    Janice [name]  FMW:[id number] DOB: 24-May-1966 DOA: 02/17/2024 PCP: Vicci Barnie NOVAK, MD    Brief Narrative:  58 year old with history of hypertension, COPD, sleep apnea on CPAP, CKD stage IIIa, migraine headaches presented with retrosternal chest pain both typical atypical features, exertional pain. Negative troponins and EKG. CT angiogram with evidence of pulmonary hypertension and coronary calcifications. Admitted with cardiology consultation.  Acute coronary syndrome ruled out.  Patient was planned for outpatient PET scan.  However, she complained of ongoing pain, depression symptoms and suicidal thoughts.  Psychiatry consulted.  Subjective: Seen and examined.  Xiphisternal pain responded to oxycodone .  She has some right lower chest wall pain which is positional.  We discussed that this is musculoskeletal pain.  Her xiphisternal pain could also be musculoskeletal pain. Patient desired to talk to psychiatry as she is having suicidal thoughts thinking about going home.  Consulted psychiatry and discussed the case.  Assessment & Plan:   Chest pain: Both typical and atypical features.  High risk of coronary artery disease.  Acute coronary syndrome ruled out. EKG subsequent EKG.  Troponins and subsequent troponins were nonischemic.  Echocardiogram without regional wall motion abnormality.  Followed by cardiology.  They recommend outpatient PET scan.  Patient cannot use NSAIDs because of CKD.  Will try some oxycodone  to relieve her pain. Has significant history of reflux. increase dose of Protonix  to 40 mg daily.  Essential hypertension: Blood pressure is stable on labetalol , amlodipine , aspirin  lactone  COPD: Without exacerbation.  As needed albuterol .  Depression with suicidal thoughts: Currently denies any suicidal homicidal ideations.  She is on Wellbutrin  and Zoloft  that is continued.  With her significant symptoms, will consult psychiatry.  CKD stage IIIb: At  about baseline.  Monitor closely.  History of migraine: On Ubrelvy .  Sleep apnea: Supposed to be on CPAP at night.  Not wearing.    DVT prophylaxis: enoxaparin  (LOVENOX ) injection 40 mg Start: 02/18/24 1000   Code Status: Full code Family Communication: None at the bedside Disposition Plan: Status is: Observation The patient will require care spanning > 2 midnights and should be moved to inpatient because: Persistently symptomatic     Consultants:  Cardiology Psychiatry  Procedures:  None  Antimicrobials:  None     Objective: Vitals:   02/19/24 1305 02/19/24 2146 02/20/24 0542 02/20/24 0949  BP: (!) 170/107 127/71 95/71 (!) 103/59  Pulse: 83 66 63 70  Resp: (!) 22 16 17    Temp: 98.3 F (36.8 C) 98.1 F (36.7 C) 97.7 F (36.5 C)   TempSrc:      SpO2: 97% 100% 97%   Weight:      Height:        Intake/Output Summary (Last 24 hours) at 02/20/2024 1138 Last data filed at 02/20/2024 0900 Gross per 24 hour  Intake 120 ml  Output --  Net 120 ml   Filed Weights   02/17/24 1849 02/18/24 0623  Weight: 101.6 kg 99.2 kg    Examination:  General exam: Calm and comfortable.  Pleasant to interaction but intermittently anxious. Respiratory system: Clear to auscultation. Respiratory effort normal. Cardiovascular system: S1 & S2 heard, RRR. No JVD, murmurs, rubs, gallops or clicks. No pedal edema. She has slight palpable tenderness on the xiphisternal area.  No palpable tenderness along the rib cage. Gastrointestinal system: Abdomen is nondistended, soft and nontender. No organomegaly or masses felt. Normal bowel sounds heard. Central nervous system: Alert and oriented. No focal neurological deficits. Extremities: Symmetric 5  x 5 power. Skin: No rashes, lesions or ulcers Psychiatry: Judgement and insight appear normal.  Anxious mood.  Tearful and emotional at times.    Data Reviewed: I have personally reviewed following labs and imaging studies  CBC: Recent Labs   Lab 02/17/24 2017 02/18/24 0256 02/19/24 0444  WBC 12.7* 12.1* 15.7*  HGB 12.9 11.4* 11.3*  HCT 44.5 39.6 38.9  MCV 78.1* 79.2* 79.4*  PLT 345 315 300   Basic Metabolic Panel: Recent Labs  Lab 02/17/24 2017 02/18/24 0553 02/19/24 0444  NA 138  --  136  K 3.5  --  4.1  CL 101  --  97*  CO2 26  --  20*  GLUCOSE 101*  --  120*  BUN 22*  --  30*  CREATININE 1.77* 1.46* 1.56*  CALCIUM  9.5  --  9.6   GFR: Estimated Creatinine Clearance: 41.1 mL/min (A) (by C-G formula based on SCr of 1.56 mg/dL (H)). Liver Function Tests: Recent Labs  Lab 02/17/24 2017 02/19/24 0444  AST 14* 20  ALT <5 7  ALKPHOS 113 103  BILITOT 0.7 0.3  PROT 7.8 7.5  ALBUMIN 3.9 3.8   No results for input(s): LIPASE, AMYLASE in the last 168 hours. No results for input(s): AMMONIA in the last 168 hours. Coagulation Profile: No results for input(s): INR, PROTIME in the last 168 hours. Cardiac Enzymes: No results for input(s): CKTOTAL, CKMB, CKMBINDEX, TROPONINI in the last 168 hours. BNP (last 3 results) No results for input(s): PROBNP in the last 8760 hours. HbA1C: No results for input(s): HGBA1C in the last 72 hours. CBG: No results for input(s): GLUCAP in the last 168 hours. Lipid Profile: Recent Labs    02/19/24 0444  CHOL 184  HDL 34*  LDLCALC 120*  TRIG 148  CHOLHDL 5.4   Thyroid  Function Tests: No results for input(s): TSH, T4TOTAL, FREET4, T3FREE, THYROIDAB in the last 72 hours. Anemia Panel: No results for input(s): VITAMINB12, FOLATE, FERRITIN, TIBC, IRON, RETICCTPCT in the last 72 hours. Sepsis Labs: No results for input(s): PROCALCITON, LATICACIDVEN in the last 168 hours.  Recent Results (from the past 240 hours)  Resp panel by RT-PCR (RSV, Flu A&B, Covid) Anterior Nasal Swab     Status: None   Collection Time: 02/17/24  8:17 PM   Specimen: Anterior Nasal Swab  Result Value Ref Range Status   SARS Coronavirus 2 by RT PCR  NEGATIVE NEGATIVE Final    Comment: (NOTE) SARS-CoV-2 target nucleic acids are NOT DETECTED.  The SARS-CoV-2 RNA is generally detectable in upper respiratory specimens during the acute phase of infection. The lowest concentration of SARS-CoV-2 viral copies this assay can detect is 138 copies/mL. A negative result does not preclude SARS-Cov-2 infection and should not be used as the sole basis for treatment or other patient management decisions. A negative result may occur with  improper specimen collection/handling, submission of specimen other than nasopharyngeal swab, presence of viral mutation(s) within the areas targeted by this assay, and inadequate number of viral copies(<138 copies/mL). A negative result must be combined with clinical observations, patient history, and epidemiological information. The expected result is Negative.  Fact Sheet for Patients:  BloggerCourse.com  Fact Sheet for Healthcare Providers:  SeriousBroker.it  This test is no t yet approved or cleared by the [place]  FDA and  has been authorized for detection and/or diagnosis of SARS-CoV-2 by FDA under an Emergency Use Authorization (EUA). This EUA will remain  in effect (meaning this test can be used)  for the duration of the COVID-19 declaration under Section 564(b)(1) of the Act, 21 U.S.C.section 360bbb-3(b)(1), unless the authorization is terminated  or revoked sooner.       Influenza A by PCR NEGATIVE NEGATIVE Final   Influenza B by PCR NEGATIVE NEGATIVE Final    Comment: (NOTE) The Xpert Xpress SARS-CoV-2/FLU/RSV plus assay is intended as an aid in the diagnosis of influenza from Nasopharyngeal swab specimens and should not be used as a sole basis for treatment. Nasal washings and aspirates are unacceptable for Xpert Xpress SARS-CoV-2/FLU/RSV testing.  Fact Sheet for Patients: BloggerCourse.com  Fact Sheet for  Healthcare Providers: SeriousBroker.it  This test is not yet approved or cleared by the [place]  FDA and has been authorized for detection and/or diagnosis of SARS-CoV-2 by FDA under an Emergency Use Authorization (EUA). This EUA will remain in effect (meaning this test can be used) for the duration of the COVID-19 declaration under Section 564(b)(1) of the Act, 21 U.S.C. section 360bbb-3(b)(1), unless the authorization is terminated or revoked.     Resp Syncytial Virus by PCR NEGATIVE NEGATIVE Final    Comment: (NOTE) Fact Sheet for Patients: BloggerCourse.com  Fact Sheet for Healthcare Providers: SeriousBroker.it  This test is not yet approved or cleared by the [place]  FDA and has been authorized for detection and/or diagnosis of SARS-CoV-2 by FDA under an Emergency Use Authorization (EUA). This EUA will remain in effect (meaning this test can be used) for the duration of the COVID-19 declaration under Section 564(b)(1) of the Act, 21 U.S.C. section 360bbb-3(b)(1), unless the authorization is terminated or revoked.  Performed at [facility], [address] [place], [place] [address]          Radiology Studies: ECHOCARDIOGRAM COMPLETE Result Date: 02/18/2024    ECHOCARDIOGRAM REPORT   Patient Name:   Janice [name] Date of Exam: 02/18/2024 Medical Rec #:  [id number]        Height:       60.0 in Accession #:    [id number]       Weight:       218.7 lb Date of Birth:  Oct 21, 1965         BSA:          1.939 m Patient Age:    58 years         BP:           158/98 mmHg Patient Gender: F                HR:           71 bpm. Exam Location:  Inpatient Procedure: 2D Echo, Color Doppler and Cardiac Doppler (Both Spectral and Color            Flow Doppler were utilized during procedure). Indications:    Chest Pain R07.9  History:        Patient has no prior history of Echocardiogram  examinations.  Sonographer:    Tinnie Gosling RDCS Referring Phys: [id number] ARSHAD N KAKRAKANDY IMPRESSIONS  1. Left ventricular ejection fraction, by estimation, is 60 to 65%. The left ventricle has normal function. Left ventricular endocardial border not optimally defined to evaluate regional wall motion. Left ventricular diastolic parameters are consistent with Grade I diastolic dysfunction (impaired relaxation).  2. Right ventricular systolic function is normal. The right ventricular size is normal.  3. The mitral valve is normal in structure. No evidence of mitral valve regurgitation. No evidence of mitral stenosis.  4. The  aortic valve is normal in structure. Aortic valve regurgitation is not visualized. No aortic stenosis is present.  5. The inferior vena cava is normal in size with greater than 50% respiratory variability, suggesting right atrial pressure of 3 mmHg. FINDINGS  Left Ventricle: Left ventricular ejection fraction, by estimation, is 60 to 65%. The left ventricle has normal function. Left ventricular endocardial border not optimally defined to evaluate regional wall motion. The left ventricular internal cavity size was normal in size. There is no left ventricular hypertrophy. Left ventricular diastolic parameters are consistent with Grade I diastolic dysfunction (impaired relaxation). Right Ventricle: The right ventricular size is normal. No increase in right ventricular wall thickness. Right ventricular systolic function is normal. Left Atrium: Left atrial size was normal in size. Right Atrium: Right atrial size was normal in size. Pericardium: There is no evidence of pericardial effusion. Mitral Valve: The mitral valve is normal in structure. No evidence of mitral valve regurgitation. No evidence of mitral valve stenosis. Tricuspid Valve: The tricuspid valve is normal in structure. Tricuspid valve regurgitation is not demonstrated. No evidence of tricuspid stenosis. Aortic Valve: The aortic valve is  normal in structure. Aortic valve regurgitation is not visualized. No aortic stenosis is present. Pulmonic Valve: The pulmonic valve was normal in structure. Pulmonic valve regurgitation is not visualized. No evidence of pulmonic stenosis. Aorta: The aortic root is normal in size and structure. Venous: The inferior vena cava is normal in size with greater than 50% respiratory variability, suggesting right atrial pressure of 3 mmHg. IAS/Shunts: No atrial level shunt detected by color flow Doppler.  LEFT VENTRICLE PLAX 2D LVIDd:         4.60 cm   Diastology LVIDs:         2.90 cm   LV e' medial:    5.87 cm/s LV PW:         1.10 cm   LV E/e' medial:  10.2 LV IVS:        1.10 cm   LV e' lateral:   6.85 cm/s LVOT diam:     2.20 cm   LV E/e' lateral: 8.8 LV SV:         60 LV SV Index:   31 LVOT Area:     3.80 cm  RIGHT VENTRICLE RV S prime:     15.60 cm/s TAPSE (M-mode): 1.5 cm LEFT ATRIUM         Index LA diam:    3.70 cm 1.91 cm/m  AORTIC VALVE LVOT Vmax:   69.80 cm/s LVOT Vmean:  44.800 cm/s LVOT VTI:    0.158 m  AORTA Ao Root diam: 3.30 cm Ao Asc diam:  3.60 cm MITRAL VALVE MV Area (PHT): 2.50 cm    SHUNTS MV Decel Time: 304 msec    Systemic VTI:  0.16 m MV E velocity: 60.00 cm/s  Systemic Diam: 2.20 cm MV A velocity: 84.40 cm/s MV E/A ratio:  0.71 Aditya Sabharwal Electronically signed by Ria Commander Signature Date/Time: 02/18/2024/3:44:34 PM    Final         Scheduled Meds:  amLODipine   10 mg Oral QHS   aspirin  EC  81 mg Oral Daily   atorvastatin   40 mg Oral Daily   buPROPion   150 mg Oral Daily   calcium  carbonate  1 tablet Oral TID WC   docusate sodium   100 mg Oral BID   enoxaparin  (LOVENOX ) injection  40 mg Subcutaneous Q24H   labetalol   150 mg Oral BID  pantoprazole   40 mg Oral Daily   polyethylene glycol  17 g Oral Daily   sertraline   100 mg Oral Daily   spironolactone   25 mg Oral Daily   traZODone   50 mg Oral QHS   Continuous Infusions:   LOS: 0 days    Time spent: 40  minutes    Renato Applebaum, MD [facility]

## 2024-02-20 NOTE — Progress Notes (Signed)
 I was notified by the nurse tech that patient was upset and crying in her room. I went in to check on patient and asked her what was wrong. She stated that she deals w/ depression and has been in a mental institution before. She expressed that she was uncomfortable going home because she was still having chest pain and still didn't have any answers as to why she was having this pain. I asked her if she had any thoughts of hurting herself, she stated that she has before and that's why she's worried about going home, because she's worried about what she might do. I asked if she had a plan, she said no. I notified the MD and charge RN. MD consulted psych and came up to see patient.

## 2024-02-20 NOTE — Consult Note (Addendum)
 [facility] Psychiatric Consult Initial  Patient Name: .Janice [name]  MRN: [id number]  DOB: 01-24-1966  Consult Order details:  Orders (From admission, onward)     Start     Ordered   02/19/24 1336  IP CONSULT TO PSYCHIATRY       Ordering Provider: Raenelle Coria, MD  Provider:  (Not yet assigned)  Question Answer Comment  Location [facility]   Reason for Consult? depressive symptoms , suicidal thoughts . currently no active suicidal ideation      02/19/24 1336             Mode of Visit: In person    Psychiatry Consult Evaluation  Service Date: February 20, 2024 LOS:  LOS: 0 days  Chief Complaint Chest pain  Primary Psychiatric Diagnoses  Major depressive disorder   Assessment  Janice [name] is a 58 y.o. female admitted: Presented to the [facility] 02/17/2024  6:40 PM for Chest pain. She carries the psychiatric diagnoses of Major depressive disorder and has a past medical history of COPD mixed type.   Her current presentation of of low motivation and energy, lack of sleep, depressed mood, and suicide ideations is most consistent with MDD. She does not meet criteria for inpatient hospitalization based on currently denying suicidal ideations.  Current outpatient psychotropic medications include Wellbutrin  and Zoloft  and historically she has had a good response to these medications. She was noncompliant with medications prior to admission as evidenced by patient report. On initial examination, patient reports worsening anxiety/depression/suicidal ideations in the context of chest pain. Please see plan below for detailed recommendations.    Janice [name] is a 58 y.o.  female with a past psychiatric history of major depressive disorder who presents to the ED endorsing suicidal ideation with a reported plan to "walk into traffic" that she attributes to severe, uncontrolled chest pain. Medical workup for the chest pain in the ED has been negative to date.  The patient states that her pain is a major driver of worsening depressive symptoms and suicidal thinking; she reports a primary care appointment scheduled for Friday and was encouraged to engage in outpatient therapy. During the psychiatric evaluation she appeared easily distracted and at times provided vague or non-specific answers when asked about suicidality and risk factors. On mental status exam she demonstrated normal speech, mood, and affect and at the time of assessment she denied current suicidal ideation. Given the temporal relationship between pain and suicidal intent, the patient's difficulty providing consistent, detailed responses regarding plan/intent, and ongoing need to address the chest pain medically and psychosocially, inpatient admission for further medical workup, close monitoring of safety, and coordinated psychiatric pain-focused management is indicated.  Diagnoses:  Active Hospital problems: Principal Problem:   Chest pain Active Problems:   Essential hypertension, benign   Migraine with aura and without status migrainosus, not intractable   Major depressive disorder   Stage 3b chronic kidney disease   OSA (obstructive sleep apnea)   COPD mixed type (HCC)    Plan   ## Psychiatric Medication Recommendations:  Continue home medications Refer patient to an outpatient therapist  -Optimize acute pain management in coordination with primary team/pain service; consider nonopioid and multimodal approaches and involve cardiology/medicine as indicated.  -Provide brief safety planning (identify coping strategies, remove means as appropriate) and give immediate crisis resources (988, local crisis lines).  -Encourage outpatient follow-up with primary care (appointment Friday) and referral to psychotherapy; provide list of community behavioral health/substance-free resources.  ##  Medical Decision Making Capacity: Not specifically addressed in this encounter  ## Further  Work-up:  TSH, B12, folate or TOC consult for substance abuse resources -- most recent EKG on 02/18/24 had QtC of 462 -- Pertinent labwork reviewed earlier this admission includes: CBC, CMP,    ## Disposition:-- There are no psychiatric contraindications to discharge at this time  ## Behavioral / Environmental: - No specific recommendations at this time.     ## Safety and Observation Level:  - Based on my clinical evaluation, I estimate the patient to be at low risk of self harm in the current setting. - At this time, we recommend  routine. This decision is based on my review of the chart including patient's history and current presentation, interview of the patient, mental status examination, and consideration of suicide risk including evaluating suicidal ideation, plan, intent, suicidal or self-harm behaviors, risk factors, and protective factors. This judgment is based on our ability to directly address suicide risk, implement suicide prevention strategies, and develop a safety plan while the patient is in the clinical setting. Please contact our team if there is a concern that risk level has changed.  CSSR Risk Category:C-SSRS RISK CATEGORY: No Risk  Suicide Risk Assessment: Patient has following modifiable risk factors for suicide: social isolation, medication noncompliance, current symptoms: anxiety/panic, insomnia, impulsivity, anhedonia, hopelessness, triggering events, recent psychiatric hospitalization, pain, medical illness (ie new dx of cancer), and recent loss (death, isolation, vocation), which we are addressing by outpatient psychiatric management, therapy and pain management by primary team. Patient has following non-modifiable or demographic risk factors for suicide: history of suicide attempt, history of self harm behavior, and psychiatric hospitalization Patient has the following protective factors against suicide: Access to outpatient mental health care and Supportive  family  Thank you for this consult request. Recommendations have been communicated to the primary team.  We will sign off at this time.   Majel GORMAN Ramp, FNP       History of Present Illness  Relevant Aspects of Hospital ED Course:  Admitted on 02/17/2024 for Chest pain. They presented for chest pain. Troponin and EKG were normal; no evidence of of pulmonary hypertension.   Patient Report:  The patient reports ongoing chest pain, which has significantly contributed to her feelings of depression and recent suicidal ideation. Although she has received medical treatment during her hospital stay, she remains uncertain about the cause of her symptoms, which has led to increased emotional distress. She expressed a desire to remain on the medical unit for an additional day, stating she does not yet feel well enough to be discharged. The patient reports feeling fearful about going home while her pain remains unresolved and continues to worry that something bad might happen to her or a loved one. While she denied any current thoughts of self-harm, she shared that she experienced such thoughts yesterday, prompted by the possibility of being discharged without answers regarding her condition.  Psych ROS:  Depression: endorses Anxiety:  endorses Mania (lifetime and current): none noted in chart Psychosis: (lifetime and current): none noted in chart  Collateral information:    Review of Systems  Cardiovascular:  Positive for chest pain.  Psychiatric/Behavioral:  Positive for depression.   All other systems reviewed and are negative.    Psychiatric and Social History  Psychiatric History:  Information collected from Patient  Prev Dx/Sx: MDD Current Psych Provider: [facility] Home Meds (current): Zoloft , Wellbutrin  Previous Med Trials: unknown Therapy: none  Prior Psych Hospitalization:  [facility] for 1 week  Prior Self Harm: yes Prior Violence:  none  Family Psych History: Maternal grandmother diagnosed with Depression, Father diagnosed with depression Family Hx suicide: Oldest son had a previous unsuccessful suicide attempt  Social History:  Developmental Hx: WNL  Educational Hx: did not complete highschool Occupational Hx: retired Armed forces operational officer Hx: none Living Situation: Lives with boyfriend Spiritual Hx: none reported Access to weapons/lethal means: Denies   Substance History Alcohol: yes  Type of alcohol: Wine coolers Last Drink: patient unsure Number of drinks per day: drinks occasionally History of alcohol withdrawal seizures: none noted in chart History of DT's: none noted in chart Tobacco: Tobacco free for 2 years Illicit drugs: THC Prescription drug abuse: denies Rehab hx: none noted in chart  Exam Findings  Physical Exam: Morbidly obese female appears age stated, observed lying in bed.  Vital Signs:  Temp:  [97.7 F (36.5 C)-98.3 F (36.8 C)] 97.7 F (36.5 C) (09/17 0542) Pulse Rate:  [63-83] 70 (09/17 0949) Resp:  [16-22] 17 (09/17 0542) BP: (95-170)/(59-107) 103/59 (09/17 0949) SpO2:  [97 %-100 %] 97 % (09/17 0542) Blood pressure (!) 103/59, pulse 70, temperature 97.7 F (36.5 C), resp. rate 17, height 5' (1.524 m), weight 99.2 kg, last menstrual period 07/20/2016, SpO2 97%. Body mass index is 42.71 kg/m.  Physical Exam Vitals and nursing note reviewed.  Constitutional:      Appearance: She is well-developed. She is obese.  Skin:    General: Skin is warm and dry.  Neurological:     General: No focal deficit present.     Mental Status: She is alert and oriented to person, place, and time.  Psychiatric:        Mood and Affect: Mood normal.        Behavior: Behavior normal.     Mental Status Exam: General Appearance: Fairly Groomed  Orientation:  Full (Time, Place, and Person)  Memory:  Immediate;   Fair Recent;   Fair Remote;   Fair  Concentration:  Concentration: Good and Attention Span: Good   Recall:  Fair  Attention  Good  Eye Contact:  Fair  Speech:  Clear and Coherent  Language:  Good  Volume:  Normal  Mood: Depressed  Affect:  Appropriate and Congruent  Thought Process:  Coherent and Goal Directed  Thought Content:  WDL  Suicidal Thoughts:  No  Homicidal Thoughts:  No  Judgement:  Fair  Insight:  Fair  Psychomotor Activity:  Negative  Akathisia:  Negative  Fund of Knowledge:  Fair      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  Family History:  The patient's family history includes Alcohol abuse in her father; Alzheimer's disease in her maternal grandfather; Cancer in her paternal aunt; Dementia in her maternal grandmother; Depression in her father; Drug abuse in her father; Hypertension in her daughter, father, maternal grandmother, and mother; Kidney disease in her mother; Pancreatic cancer (age of onset: 74 - 11) in her father; Stroke in her maternal grandmother.  Medical History: Past Medical History:  Diagnosis Date   Esophageal reflux 08/02/2006   Essential hypertension, benign 08/02/2006   Generalized anxiety disorder    Hypertensive urgency 07/10/2022   Leukocytosis 05/26/2022   Major depressive disorder 05/31/2021  Migraine with aura and without status migrainosus, not intractable 05/31/2021   Morbid obesity with body mass index (BMI) of 40.0 to 44.9 in adult 12/13/2021   OSA (obstructive sleep apnea) 06/01/2022   NPSG 07/04/22- AHI 63.4/hr, desaturation to 66%/mean 89.7%, body      Pap smear abnormality of cervix/human papillomavirus (HPV) positive 11/06/2019   Perimenopausal 03/06/2019   Polycythemia, secondary 05/26/2022   Prediabetes 04/04/2019   Primary osteoarthritis of right knee 12/13/2021   Psoriasis 03/06/2019   Stage 3b  chronic kidney disease 05/03/2022   Trichomonas infection 11/06/2019   Vitamin D  deficiency 09/29/2015    Surgical History: Past Surgical History:  Procedure Laterality Date   CESAREAN SECTION     x2     Medications:   Current Facility-Administered Medications:    acetaminophen  (TYLENOL ) tablet 650 mg, 650 mg, Oral, Q6H PRN, 650 mg at 02/19/24 1841 **OR** acetaminophen  (TYLENOL ) suppository 650 mg, 650 mg, Rectal, Q6H PRN, Franky Redia SAILOR, MD   albuterol  (PROVENTIL ) (2.5 MG/3ML) 0.083% nebulizer solution 2.5 mg, 2.5 mg, Nebulization, Q6H PRN, Franky Redia SAILOR, MD   amLODipine  (NORVASC ) tablet 10 mg, 10 mg, Oral, QHS, Franky Redia SAILOR, MD, 10 mg at 02/19/24 2147   aspirin  EC tablet 81 mg, 81 mg, Oral, Daily, Franky Redia SAILOR, MD, 81 mg at 02/20/24 0948   atorvastatin  (LIPITOR) tablet 40 mg, 40 mg, Oral, Daily, Garrick Leontine SAILOR, PA-C, 40 mg at 02/20/24 9051   buPROPion  (WELLBUTRIN  XL) 24 hr tablet 150 mg, 150 mg, Oral, Daily, Franky Redia SAILOR, MD, 150 mg at 02/20/24 0948   calcium  carbonate (TUMS - dosed in mg elemental calcium ) chewable tablet 200 mg of elemental calcium , 1 tablet, Oral, TID WC, Ghimire, Kuber, MD, 200 mg of elemental calcium  at 02/20/24 1217   docusate sodium  (COLACE) capsule 100 mg, 100 mg, Oral, BID, Ghimire, Kuber, MD, 100 mg at 02/20/24 0949   enoxaparin  (LOVENOX ) injection 40 mg, 40 mg, Subcutaneous, Q24H, Franky Redia SAILOR, MD, 40 mg at 02/20/24 0947   guaiFENesin -dextromethorphan  (ROBITUSSIN DM) 100-10 MG/5ML syrup 5 mL, 5 mL, Oral, Q4H PRN, Ghimire, Kuber, MD, 5 mL at 02/19/24 2145   labetalol  (NORMODYNE ) tablet 150 mg, 150 mg, Oral, BID, Garrick Leontine N, PA-C, 150 mg at 02/20/24 9050   oxyCODONE  (Oxy IR/ROXICODONE ) immediate release tablet 5 mg, 5 mg, Oral, Q4H PRN, Ghimire, Kuber, MD, 5 mg at 02/20/24 9371   pantoprazole  (PROTONIX ) EC tablet 40 mg, 40 mg, Oral, Daily, Ghimire, Kuber, MD, 40 mg at 02/20/24 0948   polyethylene glycol  (MIRALAX  / GLYCOLAX ) packet 17 g, 17 g, Oral, Daily, Ghimire, Kuber, MD, 17 g at 02/20/24 0948   sertraline  (ZOLOFT ) tablet 100 mg, 100 mg, Oral, Daily, Franky Redia SAILOR, MD, 100 mg at 02/20/24 0948   spironolactone  (ALDACTONE ) tablet 25 mg, 25 mg, Oral, Daily, Franky Redia SAILOR, MD, 25 mg at 02/20/24 0948   traZODone  (DESYREL ) tablet 50 mg, 50 mg, Oral, QHS, Franky Redia SAILOR, MD, 50 mg at 02/19/24 2147  Allergies: Allergies  Allergen Reactions   Losartan  Other (See Comments)    Chest pain   Neomycin Sulfate Swelling, Rash and Other (See Comments)    Pain in ear, swelling and redness    Majel GORMAN Ramp, FNP

## 2024-02-20 NOTE — Plan of Care (Signed)

## 2024-02-20 NOTE — Consult Note (Incomplete)
 [place] [facility] {CHL [facility] INITIAL OR FOLLOW LE:68184}  Patient Name: .Janice [name]  MRN: [id number]  DOB: 07/02/1965  Consult Order details:  Orders (From admission, onward)     Start     Ordered   02/19/24 1336  IP CONSULT TO PSYCHIATRY       Ordering Provider: Raenelle Coria, MD  Provider:  (Not yet assigned)  Question Answer Comment  Location [facility]   Reason for Consult? depressive symptoms , suicidal thoughts . currently no active suicidal ideation      02/19/24 1336             Mode of Visit: {Type of visit:31911}    Psychiatry Consult Evaluation  Service Date: February 20, 2024 LOS:  LOS: 0 days  Chief Complaint ***  Primary Psychiatric Diagnoses  *** 2.  *** 3.  ***  Assessment  Janice [name] is a 58 y.o. female admitted: {CHL [facility] or Presented to ZI:68182}qnm 02/17/2024  6:40 PM for ***. She carries the psychiatric diagnoses of *** and has a past medical history of  ***.   Her current presentation of *** is most consistent with ***. She meets criteria for *** based on ***.  Current outpatient psychotropic medications include *** and historically she has had a *** response to these medications. She was *** compliant with medications prior to admission as evidenced by ***. On initial examination, patient ***. Please see plan below for detailed recommendations.   Diagnoses:  Active Hospital problems: Principal Problem:   Chest pain Active Problems:   Essential hypertension, benign   Migraine with aura and without status migrainosus, not intractable   Major depressive disorder   Stage 3b chronic kidney disease   OSA (obstructive sleep apnea)   COPD mixed type (HCC)    Plan   ## Psychiatric Medication Recommendations:  ***  ## Medical Decision Making Capacity: {CHL BH MEDICAL DECISION MAKING CAPACITY:31818}  ## Further Work-up:  -- *** {CHLmacgeneralandspecificworkuprecs:31821} -- most recent EKG  on *** had QtC of *** -- Pertinent labwork reviewed earlier this admission includes: ***   ## Disposition:-- {CHLmaccldispo:31820}  ## Behavioral / Environmental: -{CHLmacbehavioralenvironmental2:31847}    ## Safety and Observation Level:  - Based on my clinical evaluation, I estimate the patient to be at *** risk of self harm in the current setting. - At this time, we recommend  {CHL BH SUICIDE OBSERVATION LEVEL:31850}. This decision is based on my review of the chart including patient's history and current presentation, interview of the patient, mental status examination, and consideration of suicide risk including evaluating suicidal ideation, plan, intent, suicidal or self-harm behaviors, risk factors, and protective factors. This judgment is based on our ability to directly address suicide risk, implement suicide prevention strategies, and develop a safety plan while the patient is in the clinical setting. Please contact our team if there is a concern that risk level has changed.  CSSR Risk Category:C-SSRS RISK CATEGORY: No Risk  Suicide Risk Assessment: Patient has following modifiable risk factors for suicide: {CHLmacmodifiablesuicideriskfactors:31822}, which we are addressing by ***. Patient has following non-modifiable or demographic risk factors for suicide: {CHLmacnonmodifiablesuicideriskfactors:31823} Patient has the following protective factors against suicide: {CHLmacprotectivefactors:31824}  Thank you for this consult request. Recommendations have been communicated to the primary team.  We will *** at this time.   Janice GORMAN Ramp, FNP       History of Present Illness  Relevant Aspects of [facility] [place] or ED course:31819} Course:  Admitted on 02/17/2024 for ***. They ***.   Patient Report:  ***  Psych ROS:  Depression: *** Anxiety:  *** Mania (lifetime and current): *** Psychosis: (lifetime and current): ***  Collateral information:  Contacted ***  at *** on ***  ROS   Psychiatric and Social History  Psychiatric History:  Information collected from ***  Prev Dx/Sx: *** Current Psych Provider: *** Home Meds (current): *** Previous Med Trials: *** Therapy: ***  Prior Psych Hospitalization: ***  Prior Self Harm: *** Prior Violence: ***  Family Psych History: *** Family Hx suicide: ***  Social History:  Developmental Hx: *** Educational Hx: *** Occupational Hx: *** Legal Hx: *** Living Situation: *** Spiritual Hx: *** Access to weapons/lethal means: ***   Substance History Alcohol: ***  Type of alcohol *** Last Drink *** Number of drinks per day *** History of alcohol withdrawal seizures *** History of DT's *** Tobacco: *** Illicit drugs: *** Prescription drug abuse: *** Rehab hx: ***  Exam Findings  Physical Exam: *** Vital Signs:  Temp:  [97.7 F (36.5 C)-98.3 F (36.8 C)] 97.7 F (36.5 C) (09/17 0542) Pulse Rate:  [63-83] 70 (09/17 0949) Resp:  [16-22] 17 (09/17 0542) BP: (95-170)/(59-107) 103/59 (09/17 0949) SpO2:  [97 %-100 %] 97 % (09/17 0542) Blood pressure (!) 103/59, pulse 70, temperature 97.7 F (36.5 C), resp. rate 17, height 5' (1.524 m), weight 99.2 kg, last menstrual period 07/20/2016, SpO2 97%. Body mass index is 42.71 kg/m.  Physical Exam  Mental Status Exam: General Appearance: {Appearance:22683}  Orientation:  {BHH ORIENTATION (PAA):22689}  Memory:  {BHH MEMORY:22881}  Concentration:  {Concentration:21399}  Recall:  {BHH GOOD/FAIR/POOR:22877}  Attention  {BH Attention Span:31825}  Eye Contact:  {BHH EYE CONTACT:22684}  Speech:  {Speech:22685}  Language:  {BHH GOOD/FAIR/POOR:22877}  Volume:  {Volume (PAA):22686}  Mood: ***  Affect:  {Affect (PAA):22687}  Thought Process:  {Thought Process (PAA):22688}  Thought Content:  {Thought Content:22690}  Suicidal Thoughts:  {ST/HT (PAA):22692}  Homicidal Thoughts:  {ST/HT (PAA):22692}  Judgement:  {Judgement (PAA):22694}  Insight:   {Insight (PAA):22695}  Psychomotor Activity:  {Psychomotor (PAA):22696}  Akathisia:  {BHH YES OR NO:22294}  Fund of Knowledge:  {BHH GOOD/FAIR/POOR:22877}      Assets:  {Assets (PAA):22698}  Cognition:  {chl bhh cognition:304700322}  ADL's:  {BHH JIO'D:77709}  AIMS (if indicated):        Other History   These have been pulled in through the EMR, reviewed, and updated if appropriate.  Family History:  The patient's family history includes Alcohol abuse in her father; Alzheimer's disease in her maternal grandfather; Cancer in her paternal aunt; Dementia in her maternal grandmother; Depression in her father; Drug abuse in her father; Hypertension in her daughter, father, maternal grandmother, and mother; Kidney disease in her mother; Pancreatic cancer (age of onset: 56 - 24) in her father; Stroke in her maternal grandmother.  Medical History: Past Medical History:  Diagnosis Date   Esophageal reflux 08/02/2006   Essential hypertension, benign 08/02/2006   Generalized anxiety disorder    Hypertensive urgency 07/10/2022   Leukocytosis 05/26/2022   Major depressive disorder 05/31/2021   Migraine with aura and without status migrainosus, not intractable 05/31/2021   Morbid obesity with body mass index (BMI) of 40.0 to 44.9 in adult 12/13/2021   OSA (obstructive sleep apnea) 06/01/2022   NPSG 07/04/22- AHI 63.4/hr, desaturation to 66%/mean 89.7%, body      Pap smear abnormality of cervix/human papillomavirus (HPV) positive 11/06/2019   Perimenopausal 03/06/2019   Polycythemia, secondary  05/26/2022   Prediabetes 04/04/2019   Primary osteoarthritis of right knee 12/13/2021   Psoriasis 03/06/2019   Stage 3b chronic kidney disease 05/03/2022   Trichomonas infection 11/06/2019   Vitamin D  deficiency 09/29/2015    Surgical History: Past Surgical History:  Procedure Laterality Date   CESAREAN SECTION     x2     Medications:   Current Facility-Administered Medications:     acetaminophen  (TYLENOL ) tablet 650 mg, 650 mg, Oral, Q6H PRN, 650 mg at 02/19/24 1841 **OR** acetaminophen  (TYLENOL ) suppository 650 mg, 650 mg, Rectal, Q6H PRN, Franky Redia SAILOR, MD   albuterol  (PROVENTIL ) (2.5 MG/3ML) 0.083% nebulizer solution 2.5 mg, 2.5 mg, Nebulization, Q6H PRN, Franky Redia SAILOR, MD   amLODipine  (NORVASC ) tablet 10 mg, 10 mg, Oral, QHS, Franky Redia SAILOR, MD, 10 mg at 02/19/24 2147   aspirin  EC tablet 81 mg, 81 mg, Oral, Daily, Franky Redia SAILOR, MD, 81 mg at 02/20/24 0948   atorvastatin  (LIPITOR) tablet 40 mg, 40 mg, Oral, Daily, Garrick Leontine SAILOR, PA-C, 40 mg at 02/20/24 9051   buPROPion  (WELLBUTRIN  XL) 24 hr tablet 150 mg, 150 mg, Oral, Daily, Franky Redia SAILOR, MD, 150 mg at 02/20/24 0948   calcium  carbonate (TUMS - dosed in mg elemental calcium ) chewable tablet 200 mg of elemental calcium , 1 tablet, Oral, TID WC, Ghimire, Kuber, MD, 200 mg of elemental calcium  at 02/20/24 0815   docusate sodium  (COLACE) capsule 100 mg, 100 mg, Oral, BID, Ghimire, Kuber, MD, 100 mg at 02/20/24 0949   enoxaparin  (LOVENOX ) injection 40 mg, 40 mg, Subcutaneous, Q24H, Franky Redia SAILOR, MD, 40 mg at 02/20/24 0947   guaiFENesin -dextromethorphan  (ROBITUSSIN DM) 100-10 MG/5ML syrup 5 mL, 5 mL, Oral, Q4H PRN, Ghimire, Kuber, MD, 5 mL at 02/19/24 2145   labetalol  (NORMODYNE ) tablet 150 mg, 150 mg, Oral, BID, Garrick Leontine SAILOR, PA-C, 150 mg at 02/20/24 0949   oxyCODONE  (Oxy IR/ROXICODONE ) immediate release tablet 5 mg, 5 mg, Oral, Q4H PRN, Ghimire, Kuber, MD, 5 mg at 02/20/24 9371   pantoprazole  (PROTONIX ) EC tablet 40 mg, 40 mg, Oral, Daily, Ghimire, Kuber, MD, 40 mg at 02/20/24 0948   polyethylene glycol (MIRALAX  / GLYCOLAX ) packet 17 g, 17 g, Oral, Daily, Ghimire, Kuber, MD, 17 g at 02/20/24 0948   sertraline  (ZOLOFT ) tablet 100 mg, 100 mg, Oral, Daily, Franky Redia SAILOR, MD, 100 mg at 02/20/24 0948   spironolactone  (ALDACTONE ) tablet 25 mg, 25 mg, Oral, Daily, Franky Redia SAILOR, MD, 25 mg at 02/20/24 0948   traZODone  (DESYREL ) tablet 50 mg, 50 mg, Oral, QHS, Franky Redia SAILOR, MD, 50 mg at 02/19/24 2147  Allergies: Allergies  Allergen Reactions   Losartan  Other (See Comments)    Chest pain   Neomycin Sulfate Swelling, Rash and Other (See Comments)    Pain in ear, swelling and redness    Janice GORMAN Ramp, FNP

## 2024-02-21 ENCOUNTER — Other Ambulatory Visit: Payer: Self-pay

## 2024-02-21 ENCOUNTER — Telehealth: Payer: Self-pay | Admitting: Internal Medicine

## 2024-02-21 ENCOUNTER — Ambulatory Visit: Admitting: Internal Medicine

## 2024-02-21 DIAGNOSIS — F4323 Adjustment disorder with mixed anxiety and depressed mood: Secondary | ICD-10-CM | POA: Diagnosis not present

## 2024-02-21 DIAGNOSIS — R072 Precordial pain: Secondary | ICD-10-CM | POA: Diagnosis not present

## 2024-02-21 MED ORDER — ATORVASTATIN CALCIUM 40 MG PO TABS: 40.0000 mg | ORAL_TABLET | Freq: Every day | ORAL | 0 refills | Status: DC

## 2024-02-21 MED ORDER — ASPIRIN 81 MG PO TBEC
81.0000 mg | DELAYED_RELEASE_TABLET | Freq: Every day | ORAL | 12 refills | Status: AC
  Filled 2024-02-21: qty 30, 30d supply, fill #0
  Filled 2024-04-10: qty 30, 30d supply, fill #1

## 2024-02-21 MED ORDER — CALCIUM CARBONATE ANTACID 500 MG PO CHEW
1.0000 | CHEWABLE_TABLET | Freq: Three times a day (TID) | ORAL | 0 refills | Status: DC
  Filled 2024-02-21: qty 90, 30d supply, fill #0

## 2024-02-21 MED ORDER — PANTOPRAZOLE SODIUM 40 MG PO TBEC: 40.0000 mg | DELAYED_RELEASE_TABLET | Freq: Every day | ORAL | 0 refills | Status: DC

## 2024-02-21 MED ORDER — ASPIRIN 81 MG PO TBEC: 81.0000 mg | DELAYED_RELEASE_TABLET | Freq: Every day | ORAL | 12 refills | Status: DC

## 2024-02-21 MED ORDER — CALCIUM CARBONATE ANTACID 500 MG PO CHEW: 1.0000 | CHEWABLE_TABLET | Freq: Three times a day (TID) | ORAL | 0 refills | Status: DC

## 2024-02-21 MED ORDER — ORAL CARE MOUTH RINSE: 15.0000 mL | OROMUCOSAL | Status: DC | PRN

## 2024-02-21 MED ORDER — ATORVASTATIN CALCIUM 40 MG PO TABS
40.0000 mg | ORAL_TABLET | Freq: Every day | ORAL | 0 refills | Status: DC
  Filled 2024-02-21: qty 90, 90d supply, fill #0

## 2024-02-21 MED ORDER — DOCUSATE SODIUM 100 MG PO CAPS: 100.0000 mg | ORAL_CAPSULE | Freq: Two times a day (BID) | ORAL | 0 refills | Status: DC

## 2024-02-21 MED ORDER — OXYCODONE HCL 5 MG PO TABS: 5.0000 mg | ORAL_TABLET | Freq: Four times a day (QID) | ORAL | 0 refills | Status: DC | PRN

## 2024-02-21 MED ORDER — PANTOPRAZOLE SODIUM 40 MG PO TBEC
40.0000 mg | DELAYED_RELEASE_TABLET | Freq: Every day | ORAL | 0 refills | Status: DC
  Filled 2024-02-21: qty 90, 90d supply, fill #0

## 2024-02-21 MED ORDER — DOCUSATE SODIUM 100 MG PO CAPS
100.0000 mg | ORAL_CAPSULE | Freq: Two times a day (BID) | ORAL | 0 refills | Status: DC
  Filled 2024-02-21: qty 60, 30d supply, fill #0

## 2024-02-21 MED ORDER — OXYCODONE HCL 5 MG PO TABS
5.0000 mg | ORAL_TABLET | Freq: Four times a day (QID) | ORAL | 0 refills | Status: DC | PRN
  Filled 2024-02-21: qty 20, 5d supply, fill #0

## 2024-02-21 NOTE — Discharge Summary (Signed)
 Physician Discharge Summary  Janice [name] FMW:[id number] DOB: 1965/12/07 DOA: 02/17/2024  PCP: Vicci Barnie NOVAK, MD  Admit date: 02/17/2024 Discharge date: 02/21/2024  Admitted From: Home Disposition: Home  Recommendations for Outpatient Follow-up:  Follow up with PCP in 1-2 weeks Please obtain BMP/CBC in one week Cardiology to schedule follow-up Follow-up with psychiatry as you have been doing  Home Health: N/A Equipment/Devices: N/A  Discharge Condition: Stable CODE STATUS: Full code Diet recommendation: Low-salt and low-carb diet  Discharge summary: 58 year old with history of hypertension, COPD, sleep apnea on CPAP, CKD stage IIIa, migraine headaches, depression with previous suicidal ideations presented with retrosternal chest pain both typical atypical features, exertional pain. Negative troponins and EKG. CT angiogram with evidence of pulmonary hypertension and coronary calcifications. Admitted with cardiology consultation.  Acute coronary syndrome ruled out.  Patient was planned for outpatient PET scan.  However, she complained of ongoing pain, depression symptoms and suicidal thoughts.  Remained in the hospital.  Seen by psychiatry.  Clinically improved.  Going home today.  # Chest pain: Both typical and atypical features.  High risk of coronary artery disease.  Acute coronary syndrome ruled out. EKG subsequent EKG.  Troponins and subsequent troponins were nonischemic.  Echocardiogram without regional wall motion abnormality.  Followed by cardiology.  They recommend outpatient PET scan.  Patient cannot use NSAIDs because of CKD.  Will try some oxycodone  to relieve her pain. Has significant history of reflux. increase dose of Protonix  to 40 mg daily. The way she responded to opiates, her pain is most likely musculoskeletal.  She will have outpatient cardiology follow-up for ambulatory ischemia evaluation.   Essential hypertension: Blood pressure is stable on labetalol ,  amlodipine , Aldactone .   COPD: Without exacerbation.  As needed albuterol .   Depression with suicidal thoughts: Currently denies any suicidal homicidal ideations.  She is on Wellbutrin  and Zoloft  that is continued.  With her significant symptoms, held by psychiatry.  Today clinically improved.  She is going to follow-up with her psychiatrist, she will benefit with mental health therapies.   CKD stage IIIb: At about baseline.  Outpatient follow-up.   History of migraine: On Ubrelvy .   Sleep apnea: Using CPAP at night.  Medically stable to discharge with outpatient follow-up.   Discharge Diagnoses:  Principal Problem:   Chest pain Active Problems:   Essential hypertension, benign   Migraine with aura and without status migrainosus, not intractable   Major depressive disorder   Stage 3b chronic kidney disease   OSA (obstructive sleep apnea)   COPD mixed type (HCC)   Adjustment disorder with mixed anxiety and depressed mood   Suicidal ideation   Precordial chest pain    Discharge Instructions  Discharge Instructions     Diet - low sodium heart healthy   Complete by: As directed    Increase activity slowly   Complete by: As directed       Allergies as of 02/21/2024       Reactions   Losartan  Other (See Comments)   Chest pain   Neomycin Sulfate Swelling, Rash, Other (See Comments)   Pain in ear, swelling and redness        Medication List     STOP taking these medications    clobetasol  ointment 0.05 % Commonly known as: TEMOVATE    famotidine  20 MG tablet Commonly known as: Pepcid    Premarin  vaginal cream Generic drug: conjugated estrogens    traMADol  50 MG tablet Commonly known as: ULTRAM    Ubrelvy  100 MG Tabs Generic  drug: Ubrogepant        TAKE these medications    Ajovy  225 MG/1.5ML Soaj Generic drug: Fremanezumab -vfrm Inject 225 mg into the skin every 28 (twenty-eight) days.   amLODipine  10 MG tablet Commonly known as: NORVASC  Take 1  tablet (10 mg total) by mouth once nightly at bedtime.   aspirin  EC 81 MG tablet Take 1 tablet (81 mg total) by mouth daily. Swallow whole. Start taking on: February 22, 2024   atorvastatin  40 MG tablet Commonly known as: LIPITOR Take 1 tablet (40 mg total) by mouth daily. Start taking on: February 22, 2024   buPROPion  150 MG 24 hr tablet Commonly known as: WELLBUTRIN  XL Take 1 tablet (150 mg total) by mouth daily.   calcium  carbonate 500 MG chewable tablet Commonly known as: TUMS - dosed in mg elemental calcium  Chew 1 tablet (200 mg of elemental calcium  total) by mouth 3 (three) times daily with meals.   docusate sodium  100 MG capsule Commonly known as: COLACE Take 1 capsule (100 mg total) by mouth 2 (two) times daily.   labetalol  100 MG tablet Commonly known as: NORMODYNE  Take 1.5 tablets (150 mg total) by mouth 2 (two) times daily.   oxyCODONE  5 MG immediate release tablet Commonly known as: Oxy IR/ROXICODONE  Take 1 tablet (5 mg total) by mouth every 6 (six) hours as needed for moderate pain (pain score 4-6) or severe pain (pain score 7-10).   pantoprazole  40 MG tablet Commonly known as: PROTONIX  Take 1 tablet (40 mg total) by mouth daily. What changed:  medication strength how much to take   PEG 3350  17 GM/SCOOP Powd Mix 1 capful (17 g) in 4-8 ounces in water/juice by mouth daily as needed (for constipation).   sertraline  100 MG tablet Commonly known as: ZOLOFT  Take 1 tablet (100 mg total) by mouth daily. What changed: Another medication with the same name was removed. Continue taking this medication, and follow the directions you see here.   spironolactone  25 MG tablet Commonly known as: ALDACTONE  Take 1 tablet (25 mg total) by mouth daily.   traZODone  50 MG tablet Commonly known as: DESYREL  Take 1 tablet (50 mg total) by mouth at bedtime.   Ventolin  HFA 108 (90 Base) MCG/ACT inhaler Generic drug: albuterol  Inhale 2 puffs into the lungs every 6 (six)  hours as needed for wheezing or shortness of breath.   Vitamin D  (Ergocalciferol ) 1.25 MG (50000 UNIT) Caps capsule Commonly known as: DRISDOL  Take 1 capsule (50,000 Units total) by mouth once a week.        Allergies  Allergen Reactions   Losartan  Other (See Comments)    Chest pain   Neomycin Sulfate Swelling, Rash and Other (See Comments)    Pain in ear, swelling and redness    Consultations: Cardiology Psychiatry   Procedures/Studies: ECHOCARDIOGRAM COMPLETE Result Date: 02/18/2024    ECHOCARDIOGRAM REPORT   Patient Name:   JOYDAN GRETZINGER Burch Date of Exam: 02/18/2024 Medical Rec #:  [id number]        Height:       60.0 in Accession #:    [id number]       Weight:       218.7 lb Date of Birth:  26-Aug-1965         BSA:          1.939 m Patient Age:    58 years         BP:           158/98 mmHg  Patient Gender: F                HR:           71 bpm. Exam Location:  Inpatient Procedure: 2D Echo, Color Doppler and Cardiac Doppler (Both Spectral and Color            Flow Doppler were utilized during procedure). Indications:    Chest Pain R07.9  History:        Patient has no prior history of Echocardiogram examinations.  Sonographer:    Tinnie Gosling RDCS Referring Phys: [id number] ARSHAD N KAKRAKANDY IMPRESSIONS  1. Left ventricular ejection fraction, by estimation, is 60 to 65%. The left ventricle has normal function. Left ventricular endocardial border not optimally defined to evaluate regional wall motion. Left ventricular diastolic parameters are consistent with Grade I diastolic dysfunction (impaired relaxation).  2. Right ventricular systolic function is normal. The right ventricular size is normal.  3. The mitral valve is normal in structure. No evidence of mitral valve regurgitation. No evidence of mitral stenosis.  4. The aortic valve is normal in structure. Aortic valve regurgitation is not visualized. No aortic stenosis is present.  5. The inferior vena cava is normal in size with greater than  50% respiratory variability, suggesting right atrial pressure of 3 mmHg. FINDINGS  Left Ventricle: Left ventricular ejection fraction, by estimation, is 60 to 65%. The left ventricle has normal function. Left ventricular endocardial border not optimally defined to evaluate regional wall motion. The left ventricular internal cavity size was normal in size. There is no left ventricular hypertrophy. Left ventricular diastolic parameters are consistent with Grade I diastolic dysfunction (impaired relaxation). Right Ventricle: The right ventricular size is normal. No increase in right ventricular wall thickness. Right ventricular systolic function is normal. Left Atrium: Left atrial size was normal in size. Right Atrium: Right atrial size was normal in size. Pericardium: There is no evidence of pericardial effusion. Mitral Valve: The mitral valve is normal in structure. No evidence of mitral valve regurgitation. No evidence of mitral valve stenosis. Tricuspid Valve: The tricuspid valve is normal in structure. Tricuspid valve regurgitation is not demonstrated. No evidence of tricuspid stenosis. Aortic Valve: The aortic valve is normal in structure. Aortic valve regurgitation is not visualized. No aortic stenosis is present. Pulmonic Valve: The pulmonic valve was normal in structure. Pulmonic valve regurgitation is not visualized. No evidence of pulmonic stenosis. Aorta: The aortic root is normal in size and structure. Venous: The inferior vena cava is normal in size with greater than 50% respiratory variability, suggesting right atrial pressure of 3 mmHg. IAS/Shunts: No atrial level shunt detected by color flow Doppler.  LEFT VENTRICLE PLAX 2D LVIDd:         4.60 cm   Diastology LVIDs:         2.90 cm   LV e' medial:    5.87 cm/s LV PW:         1.10 cm   LV E/e' medial:  10.2 LV IVS:        1.10 cm   LV e' lateral:   6.85 cm/s LVOT diam:     2.20 cm   LV E/e' lateral: 8.8 LV SV:         60 LV SV Index:   31 LVOT Area:      3.80 cm  RIGHT VENTRICLE RV S prime:     15.60 cm/s TAPSE (M-mode): 1.5 cm LEFT ATRIUM  Index LA diam:    3.70 cm 1.91 cm/m  AORTIC VALVE LVOT Vmax:   69.80 cm/s LVOT Vmean:  44.800 cm/s LVOT VTI:    0.158 m  AORTA Ao Root diam: 3.30 cm Ao Asc diam:  3.60 cm MITRAL VALVE MV Area (PHT): 2.50 cm    SHUNTS MV Decel Time: 304 msec    Systemic VTI:  0.16 m MV E velocity: 60.00 cm/s  Systemic Diam: 2.20 cm MV A velocity: 84.40 cm/s MV E/A ratio:  0.71 Aditya Sabharwal Electronically signed by Ria Commander Signature Date/Time: 02/18/2024/3:44:34 PM    Final    CT Angio Chest PE W and/or Wo Contrast Result Date: 02/18/2024 EXAM: CTA of the Chest with contrast for PE 02/18/2024 02:22:21 AM TECHNIQUE: CTA of the chest was performed after the administration of intravenous contrast. Multiplanar reformatted images are provided for review. MIP images are provided for review. Automated exposure control, iterative reconstruction, and/or weight based adjustment of the mA/kV was utilized to reduce the radiation dose to as low as reasonably achievable. COMPARISON: None available. CLINICAL HISTORY: Pulmonary embolism (PE) suspected, low to intermediate prob, positive D-dimer. FINDINGS: PULMONARY ARTERIES: The central pulmonary arteries are enlarged in keeping with changes of pulmonary arterial hypertension. No pulmonary embolism. MEDIASTINUM: Moderate cardiomegaly. No pericardial effusion. Fusiform dilation of the ascending aorta measuring 4.0 cm in diameter. Descending thoracic aorta is of normal caliber. Recommend annual imaging followup by CTA or MRA. This recommendation follows 2010 ACCF/AHA/AATS/ACR/ASA/SCA/SCAI/SIR/STS/SVM guidelines for the diagnosis and management of patients with thoracic aortic disease. Circulation. 2010; 121: z733-z630. LYMPH NODES: No mediastinal, hilar or axillary lymphadenopathy. LUNGS AND PLEURA: Bibasilar atelectasis. No confluent pulmonary infiltrate. No pneumothorax or pleural  effusion. UPPER ABDOMEN: Cholelithiasis without superimposed pericholecystic inflammatory change. No intra or extrahepatic biliary ductal dilation. No acute abnormality within the visualized upper abdomen. SOFT TISSUES AND BONES: Osseous structures are age appropriate. No acute bone abnormality. No lytic or blastic bone lesion. VASCULATURE: Moderate multivessel coronary artery calcification. Mild atherosclerotic calcification within the thoracic aorta. IMPRESSION: 1. No pulmonary embolism. 2. Moderate cardiomegaly and central pulmonary artery enlargement, consistent with pulmonary arterial hypertension. 3. Fusiform dilation of the ascending aorta measuring 4.0 cm in diameter. Recommend annual imaging follow-up by CTA or MRA. 4. Moderate multivessel coronary artery calcification. raf score: Aortic atherosclerosis (ICD10-I70.0), Aortic aneurysm (ICD10-I71.9) Electronically signed by: Dorethia Molt MD 02/18/2024 02:46 AM EDT RP Workstation: [id number]   CT Renal Stone Study Result Date: 02/18/2024 CLINICAL DATA:  Flank pain EXAM: CT ABDOMEN AND PELVIS WITHOUT CONTRAST TECHNIQUE: Multidetector CT imaging of the abdomen and pelvis was performed following the standard protocol without IV contrast. RADIATION DOSE REDUCTION: This exam was performed according to the departmental dose-optimization program which includes automated exposure control, adjustment of the mA and/or kV according to patient size and/or use of iterative reconstruction technique. COMPARISON:  None Available. FINDINGS: Hepatobiliary: The gallbladder is well distended with multiple gallstones within. Liver is unremarkable with the exception of a calcified cyst with medial aspect of the right lobe of the liver. Pancreas: Unremarkable. No pancreatic ductal dilatation or surrounding inflammatory changes. Spleen: Normal in size without focal abnormality. Adrenals/Urinary Tract: Adrenal glands are within normal limits. Kidneys demonstrate a normal  appearance without renal calculi or obstructive change bladder is decompressed. Stomach/Bowel: Scattered diverticular change of the colon is noted without evidence of diverticulitis. The appendix is within normal limits. No obstructive or inflammatory changes of the small bowel or stomach are noted. Vascular/Lymphatic: Aortic atherosclerosis. No enlarged abdominal or pelvic lymph  nodes. Reproductive: Uterus and bilateral adnexa are unremarkable. Other: No abdominal wall hernia or abnormality. No abdominopelvic ascites. Musculoskeletal: Degenerative changes of lumbar spine are noted. IMPRESSION: Cholelithiasis without complicating factors. Diverticulosis without diverticulitis. Electronically Signed   By: Oneil Devonshire M.D.   On: 02/18/2024 02:43   DG Chest 2 View Result Date: 02/17/2024 CLINICAL DATA:  Shortness of breath EXAM: CHEST - 2 VIEW COMPARISON:  Chest x-ray 11/11/2014 FINDINGS: Heart is enlarged. The lungs are clear. There is no pleural effusion or pneumothorax. No acute fractures are seen. IMPRESSION: Cardiomegaly. No acute cardiopulmonary process. Electronically Signed   By: Greig Pique M.D.   On: 02/17/2024 19:50   (Echo, Carotid, EGD, Colonoscopy, ERCP)    Subjective: Patient seen and examined.  Today she denies any complaints.  She is in a fairly comfortable and happy mood today.  She feels very comfortable going home.  Discussed about prescribing short course of oxycodone  that is a narcotic with all the side effects and she is agreeable. She has PCP appointment tomorrow.  She also has established psychiatry appointments.  Discharge Exam: Vitals:   02/21/24 0514 02/21/24 0928  BP: (!) 146/83 131/83  Pulse: 72 69  Resp: 16   Temp: 97.8 F (36.6 C)   SpO2: 93%    Vitals:   02/20/24 1357 02/20/24 2019 02/21/24 0514 02/21/24 0928  BP: 125/76 (!) 153/90 (!) 146/83 131/83  Pulse: 65 66 72 69  Resp: 18 18 16    Temp: 98.4 F (36.9 C) 98.3 F (36.8 C) 97.8 F (36.6 C)   TempSrc:       SpO2: 97% 98% 93%   Weight:      Height:        General: Pt is alert, awake, not in acute distress.  Pleasant interactive. Cardiovascular: RRR, S1/S2 +, no rubs, no gallops Respiratory: CTA bilaterally, no wheezing, no rhonchi, no palpable tenderness. Abdominal: Soft, NT, ND, bowel sounds +, obese pendulous. Extremities: no edema, no cyanosis Normal mood.  Denies any delusions or hallucinations.  Denies any suicidal homicidal thoughts.    The results of significant diagnostics from this hospitalization (including imaging, microbiology, ancillary and laboratory) are listed below for reference.     Microbiology: Recent Results (from the past 240 hours)  Resp panel by RT-PCR (RSV, Flu A&B, Covid) Anterior Nasal Swab     Status: None   Collection Time: 02/17/24  8:17 PM   Specimen: Anterior Nasal Swab  Result Value Ref Range Status   SARS Coronavirus 2 by RT PCR NEGATIVE NEGATIVE Final    Comment: (NOTE) SARS-CoV-2 target nucleic acids are NOT DETECTED.  The SARS-CoV-2 RNA is generally detectable in upper respiratory specimens during the acute phase of infection. The lowest concentration of SARS-CoV-2 viral copies this assay can detect is 138 copies/mL. A negative result does not preclude SARS-Cov-2 infection and should not be used as the sole basis for treatment or other patient management decisions. A negative result may occur with  improper specimen collection/handling, submission of specimen other than nasopharyngeal swab, presence of viral mutation(s) within the areas targeted by this assay, and inadequate number of viral copies(<138 copies/mL). A negative result must be combined with clinical observations, patient history, and epidemiological information. The expected result is Negative.  Fact Sheet for Patients:  BloggerCourse.com  Fact Sheet for Healthcare Providers:  SeriousBroker.it  This test is no t yet  approved or cleared by the [place]  FDA and  has been authorized for detection and/or diagnosis of SARS-CoV-2 by  FDA under an Emergency Use Authorization (EUA). This EUA will remain  in effect (meaning this test can be used) for the duration of the COVID-19 declaration under Section 564(b)(1) of the Act, 21 U.S.C.section 360bbb-3(b)(1), unless the authorization is terminated  or revoked sooner.       Influenza A by PCR NEGATIVE NEGATIVE Final   Influenza B by PCR NEGATIVE NEGATIVE Final    Comment: (NOTE) The Xpert Xpress SARS-CoV-2/FLU/RSV plus assay is intended as an aid in the diagnosis of influenza from Nasopharyngeal swab specimens and should not be used as a sole basis for treatment. Nasal washings and aspirates are unacceptable for Xpert Xpress SARS-CoV-2/FLU/RSV testing.  Fact Sheet for Patients: BloggerCourse.com  Fact Sheet for Healthcare Providers: SeriousBroker.it  This test is not yet approved or cleared by the [place]  FDA and has been authorized for detection and/or diagnosis of SARS-CoV-2 by FDA under an Emergency Use Authorization (EUA). This EUA will remain in effect (meaning this test can be used) for the duration of the COVID-19 declaration under Section 564(b)(1) of the Act, 21 U.S.C. section 360bbb-3(b)(1), unless the authorization is terminated or revoked.     Resp Syncytial Virus by PCR NEGATIVE NEGATIVE Final    Comment: (NOTE) Fact Sheet for Patients: BloggerCourse.com  Fact Sheet for Healthcare Providers: SeriousBroker.it  This test is not yet approved or cleared by the [place]  FDA and has been authorized for detection and/or diagnosis of SARS-CoV-2 by FDA under an Emergency Use Authorization (EUA). This EUA will remain in effect (meaning this test can be used) for the duration of the COVID-19 declaration under Section 564(b)(1) of  the Act, 21 U.S.C. section 360bbb-3(b)(1), unless the authorization is terminated or revoked.  Performed at [facility], [address] [place], [place] [address]      Labs: BNP (last 3 results) No results for input(s): BNP in the last 8760 hours. Basic Metabolic Panel: Recent Labs  Lab 02/17/24 2017 02/18/24 0553 02/19/24 0444  NA 138  --  136  K 3.5  --  4.1  CL 101  --  97*  CO2 26  --  20*  GLUCOSE 101*  --  120*  BUN 22*  --  30*  CREATININE 1.77* 1.46* 1.56*  CALCIUM  9.5  --  9.6   Liver Function Tests: Recent Labs  Lab 02/17/24 2017 02/19/24 0444  AST 14* 20  ALT <5 7  ALKPHOS 113 103  BILITOT 0.7 0.3  PROT 7.8 7.5  ALBUMIN 3.9 3.8   No results for input(s): LIPASE, AMYLASE in the last 168 hours. No results for input(s): AMMONIA in the last 168 hours. CBC: Recent Labs  Lab 02/17/24 2017 02/18/24 0256 02/19/24 0444  WBC 12.7* 12.1* 15.7*  HGB 12.9 11.4* 11.3*  HCT 44.5 39.6 38.9  MCV 78.1* 79.2* 79.4*  PLT 345 315 300   Cardiac Enzymes: No results for input(s): CKTOTAL, CKMB, CKMBINDEX, TROPONINI in the last 168 hours. BNP: Invalid input(s): POCBNP CBG: No results for input(s): GLUCAP in the last 168 hours. D-Dimer No results for input(s): DDIMER in the last 72 hours. Hgb A1c No results for input(s): HGBA1C in the last 72 hours. Lipid Profile Recent Labs    02/19/24 0444  CHOL 184  HDL 34*  LDLCALC 120*  TRIG 148  CHOLHDL 5.4   Thyroid  function studies No results for input(s): TSH, T4TOTAL, T3FREE, THYROIDAB in the last 72 hours.  Invalid input(s): FREET3 Anemia work up No results for input(s): VITAMINB12, FOLATE,  FERRITIN, TIBC, IRON, RETICCTPCT in the last 72 hours. Urinalysis    Component Value Date/Time   COLORURINE YELLOW 02/17/2024 2334   APPEARANCEUR CLOUDY (A) 02/17/2024 2334   LABSPEC 1.027 02/17/2024 2334   PHURINE 5.0 02/17/2024 2334   GLUCOSEU  NEGATIVE 02/17/2024 2334   HGBUR NEGATIVE 02/17/2024 2334   BILIRUBINUR NEGATIVE 02/17/2024 2334   BILIRUBINUR small (A) 02/16/2023 1720   KETONESUR NEGATIVE 02/17/2024 2334   PROTEINUR 30 (A) 02/17/2024 2334   UROBILINOGEN 0.2 02/16/2023 1720   UROBILINOGEN 0.2 09/27/2015 1129   NITRITE NEGATIVE 02/17/2024 2334   LEUKOCYTESUR NEGATIVE 02/17/2024 2334   Sepsis Labs Recent Labs  Lab 02/17/24 2017 02/18/24 0256 02/19/24 0444  WBC 12.7* 12.1* 15.7*   Microbiology Recent Results (from the past 240 hours)  Resp panel by RT-PCR (RSV, Flu A&B, Covid) Anterior Nasal Swab     Status: None   Collection Time: 02/17/24  8:17 PM   Specimen: Anterior Nasal Swab  Result Value Ref Range Status   SARS Coronavirus 2 by RT PCR NEGATIVE NEGATIVE Final    Comment: (NOTE) SARS-CoV-2 target nucleic acids are NOT DETECTED.  The SARS-CoV-2 RNA is generally detectable in upper respiratory specimens during the acute phase of infection. The lowest concentration of SARS-CoV-2 viral copies this assay can detect is 138 copies/mL. A negative result does not preclude SARS-Cov-2 infection and should not be used as the sole basis for treatment or other patient management decisions. A negative result may occur with  improper specimen collection/handling, submission of specimen other than nasopharyngeal swab, presence of viral mutation(s) within the areas targeted by this assay, and inadequate number of viral copies(<138 copies/mL). A negative result must be combined with clinical observations, patient history, and epidemiological information. The expected result is Negative.  Fact Sheet for Patients:  BloggerCourse.com  Fact Sheet for Healthcare Providers:  SeriousBroker.it  This test is no t yet approved or cleared by the [place]  FDA and  has been authorized for detection and/or diagnosis of SARS-CoV-2 by FDA under an Emergency Use Authorization  (EUA). This EUA will remain  in effect (meaning this test can be used) for the duration of the COVID-19 declaration under Section 564(b)(1) of the Act, 21 U.S.C.section 360bbb-3(b)(1), unless the authorization is terminated  or revoked sooner.       Influenza A by PCR NEGATIVE NEGATIVE Final   Influenza B by PCR NEGATIVE NEGATIVE Final    Comment: (NOTE) The Xpert Xpress SARS-CoV-2/FLU/RSV plus assay is intended as an aid in the diagnosis of influenza from Nasopharyngeal swab specimens and should not be used as a sole basis for treatment. Nasal washings and aspirates are unacceptable for Xpert Xpress SARS-CoV-2/FLU/RSV testing.  Fact Sheet for Patients: BloggerCourse.com  Fact Sheet for Healthcare Providers: SeriousBroker.it  This test is not yet approved or cleared by the [place]  FDA and has been authorized for detection and/or diagnosis of SARS-CoV-2 by FDA under an Emergency Use Authorization (EUA). This EUA will remain in effect (meaning this test can be used) for the duration of the COVID-19 declaration under Section 564(b)(1) of the Act, 21 U.S.C. section 360bbb-3(b)(1), unless the authorization is terminated or revoked.     Resp Syncytial Virus by PCR NEGATIVE NEGATIVE Final    Comment: (NOTE) Fact Sheet for Patients: BloggerCourse.com  Fact Sheet for Healthcare Providers: SeriousBroker.it  This test is not yet approved or cleared by the [place]  FDA and has been authorized for detection and/or diagnosis of SARS-CoV-2 by FDA under an Emergency Use  Authorization (EUA). This EUA will remain in effect (meaning this test can be used) for the duration of the COVID-19 declaration under Section 564(b)(1) of the Act, 21 U.S.C. section 360bbb-3(b)(1), unless the authorization is terminated or revoked.  Performed at [facility], [address] [place], [place] [address]      Time coordinating discharge: 45 minutes  SIGNED:   Renato Applebaum, MD  [facility] 02/21/2024, 11:28 AM

## 2024-02-21 NOTE — TOC Transition Note (Signed)
 Transition of Care [facility]) - Discharge Note   Patient Details  Name: Janice [name] MRN: [id number] Date of Birth: Oct 14, 1965  Transition of Care [facility]) CM/SW Contact:  Bascom Service, RN Phone Number: 02/21/2024, 11:35 AM   Clinical Narrative: d/c home no CM needs.      Final next level of care: Home/Self Care Barriers to Discharge: No Barriers Identified   Patient Goals and CMS Choice Patient states their goals for this hospitalization and ongoing recovery are:: Home CMS Medicare.gov Compare Post Acute Care list provided to:: Patient Choice offered to / list presented to : Patient Day ownership interest in [facility].provided to:: Patient    Discharge Placement                       Discharge Plan and Services Additional resources added to the After Visit Summary for                                       Social Drivers of Health (SDOH) Interventions SDOH Screenings   Food Insecurity: No Food Insecurity (02/18/2024)  Housing: Low Risk  (02/18/2024)  Transportation Needs: No Transportation Needs (02/18/2024)  Utilities: Not At Risk (02/18/2024)  Alcohol Screen: Low Risk  (08/23/2023)  Depression (PHQ2-9): High Risk (10/22/2023)  Financial Resource Strain: Low Risk  (09/29/2022)  Physical Activity: Insufficiently Active (09/29/2022)  Social Connections: Unknown (09/29/2022)  Stress: No Stress Concern Present (09/29/2022)  Tobacco Use: Medium Risk (02/18/2024)     Readmission Risk Interventions     No data to display

## 2024-02-21 NOTE — Telephone Encounter (Signed)
 Lvm to confirm appt for 9/19

## 2024-02-22 ENCOUNTER — Other Ambulatory Visit (HOSPITAL_COMMUNITY): Payer: Self-pay

## 2024-02-22 ENCOUNTER — Ambulatory Visit: Attending: Internal Medicine | Admitting: Internal Medicine

## 2024-02-22 ENCOUNTER — Telehealth: Payer: Self-pay | Admitting: Pharmacy Technician

## 2024-02-22 ENCOUNTER — Other Ambulatory Visit: Payer: Self-pay

## 2024-02-22 VITALS — BP 143/91 | HR 81 | Temp 98.3°F | Ht 60.0 in | Wt 219.0 lb

## 2024-02-22 DIAGNOSIS — Z2821 Immunization not carried out because of patient refusal: Secondary | ICD-10-CM

## 2024-02-22 DIAGNOSIS — R7303 Prediabetes: Secondary | ICD-10-CM

## 2024-02-22 DIAGNOSIS — D509 Iron deficiency anemia, unspecified: Secondary | ICD-10-CM

## 2024-02-22 DIAGNOSIS — I7121 Aneurysm of the ascending aorta, without rupture: Secondary | ICD-10-CM

## 2024-02-22 DIAGNOSIS — N1832 Chronic kidney disease, stage 3b: Secondary | ICD-10-CM | POA: Diagnosis not present

## 2024-02-22 DIAGNOSIS — Z79899 Other long term (current) drug therapy: Secondary | ICD-10-CM

## 2024-02-22 DIAGNOSIS — I10 Essential (primary) hypertension: Secondary | ICD-10-CM

## 2024-02-22 DIAGNOSIS — I2510 Atherosclerotic heart disease of native coronary artery without angina pectoris: Secondary | ICD-10-CM | POA: Diagnosis not present

## 2024-02-22 DIAGNOSIS — Z87891 Personal history of nicotine dependence: Secondary | ICD-10-CM

## 2024-02-22 DIAGNOSIS — Z09 Encounter for follow-up examination after completed treatment for conditions other than malignant neoplasm: Secondary | ICD-10-CM | POA: Diagnosis not present

## 2024-02-22 DIAGNOSIS — F322 Major depressive disorder, single episode, severe without psychotic features: Secondary | ICD-10-CM

## 2024-02-22 LAB — POCT GLYCOSYLATED HEMOGLOBIN (HGB A1C): HbA1c, POC (controlled diabetic range): 6 % (ref 0.0–7.0)

## 2024-02-22 LAB — GLUCOSE, POCT (MANUAL RESULT ENTRY): POC Glucose: 101 mg/dL — AB (ref 70–99)

## 2024-02-22 MED ORDER — SERTRALINE HCL 100 MG PO TABS: 100.0000 mg | ORAL_TABLET | Freq: Every day | ORAL | 0 refills | Status: DC

## 2024-02-22 MED ORDER — PANTOPRAZOLE SODIUM 40 MG PO TBEC: 40.0000 mg | DELAYED_RELEASE_TABLET | Freq: Every day | ORAL | 0 refills | Status: DC

## 2024-02-22 MED ORDER — AMLODIPINE BESYLATE 10 MG PO TABS
10.0000 mg | ORAL_TABLET | Freq: Every day | ORAL | 1 refills | Status: DC
  Filled 2024-02-22: qty 90, 90d supply, fill #0

## 2024-02-22 MED ORDER — VITAMIN D (ERGOCALCIFEROL) 1.25 MG (50000 UNIT) PO CAPS
50000.0000 [IU] | ORAL_CAPSULE | ORAL | 4 refills | Status: AC
  Filled 2024-02-22: qty 12, 84d supply, fill #0

## 2024-02-22 MED ORDER — BUPROPION HCL ER (XL) 150 MG PO TB24: 150.0000 mg | ORAL_TABLET | Freq: Every day | ORAL | 0 refills | Status: DC

## 2024-02-22 NOTE — Telephone Encounter (Signed)
 Pharmacy Patient Advocate Encounter  Received notification from [facility] MEDICAID that Prior Authorization for AJOVY  225MG  has been APPROVED from 9.18.25 to 12.18.25. Ran test claim, Copay is $4. This test claim was processed through [facility]- copay amounts may vary at other pharmacies due to pharmacy/plan contracts, or as the patient moves through the different stages of their insurance plan.   PA #/Case ID/Reference #: [id number]

## 2024-02-22 NOTE — Progress Notes (Signed)
 Patient ID: Janice [name], female    DOB: Mar 23, 1966  MRN: [id number]  CC: Hypertension (HTN & pre-diabetes f/u. Med refills/Requesting to continue Oxy - discuss is trazadone is still needed/Per hospital tramadol  not needed - please inform if pt can discard/No to flu & pneumonia vax)   Subjective: [name] is a 58 y.o. female who presents for chronic ds management. Her concerns today include:  Pt with hx of HTN,  CKD 3b, former tob dep, HL, Vit D def, preDM/morbid obesity, MDD, +HPV on pap, GERD, chronic hypokalemia, psoriasis right leg, OA knee (Dr. Jerri), migraines (Dr. Skeet), OSA on CPAP (06/2022),   Discussed the use of AI scribe software for clinical note transcription with the patient, who gave verbal consent to proceed.  History of Present Illness Janice [name] is a 58 year old female who presents for follow-up after recent hospitalization for chest pain.  She was recently hospitalized due to chest pain. During her hospitalization, a workup was conducted which did not indicate a heart attack. CTA neg for PE but showed evidence of pulHTN, ascending aortic aneurysm and coronary calcifications. She was seen by a cardiologist who recommended further evaluation with PET scan as out-pt. This is scheduled for September 24, and a follow-up with the cardiologist is set for October 15.  She was prescribed atorvastatin  40 mg daily for cholesterol management but has not yet picked up the prescription. She is also advised to start a low-dose aspirin  regimen, which she has not yet begun. Her current medications include spironolactone , vitamin D , labetalol , trazodone , amlodipine , and an inhaler. She has not picked up some of her prescriptions since discharge, including spironolactone  and atorvastatin .  She experiences ongoing pain in her lower back, side, and neck, which worsens with breathing and walking, contributing to her recent emergency room visit. She was prescribed oxycodone  for pain  management in the hospital, which she plans to pick up today. She has 2 bottles of Tramadol  that were previous prescribed by ortho for pain RT knee due to OA.  MDD: She has a history of depression and was seen by a psychiatrist during her hospitalization due to suicidal thoughts. She continues to take trazodone , Zoloft  and bupropion  for her mental health but wants a therapist as her current psychiatrist with [facility] only manages medication. Denies SI or plans for self harm today even though she answered yes to SI on PHQ9 today.  HTN: Her blood pressure was recorded at 143/91 today, and she attributes fluctuations in her blood pressure to pain. She has taken her morning doses of labetalol  and amlodipine  but needs to pick up spironolactone .  She has stage 3B chronic kidney disease, which remains stable. She avoids NSAIDs like Naprosyn , Aleve , and Advil  to prevent worsening of her kidney function.    Patient Active Problem List   Diagnosis Date Noted   Adjustment disorder with mixed anxiety and depressed mood 02/20/2024   Suicidal ideation 02/20/2024   Precordial chest pain 02/20/2024   Chest pain 02/18/2024   Genetic testing 12/10/2023   COPD mixed type (HCC) 11/14/2023   MDD (major depressive disorder) 08/23/2023   Low grade squamous intraepithelial lesion (LGSIL) on biopsy of cervix 06/23/2023   Generalized anxiety disorder    OSA (obstructive sleep apnea) 06/01/2022   Polycythemia, secondary 05/26/2022   Leukocytosis 05/26/2022   Stage 3b chronic kidney disease 05/03/2022   Primary osteoarthritis of right knee 12/13/2021   Morbid obesity with body mass index (BMI) of 40.0 to 44.9  in adult 12/13/2021   Migraine with aura and without status migrainosus, not intractable 05/31/2021   Major depressive disorder 05/31/2021   Prediabetes 04/04/2019   Perimenopausal 03/06/2019   Psoriasis 03/06/2019   Vitamin D  deficiency 09/29/2015   Essential hypertension, benign 08/02/2006    Esophageal reflux 08/02/2006     Current Outpatient Medications on File Prior to Visit  Medication Sig Dispense Refill   albuterol  (VENTOLIN  HFA) 108 (90 Base) MCG/ACT inhaler Inhale 2 puffs into the lungs every 6 (six) hours as needed for wheezing or shortness of breath. 18 g 6   aspirin  EC 81 MG tablet Take 1 tablet (81 mg total) by mouth daily. Swallow whole. 30 tablet 12   atorvastatin  (LIPITOR) 40 MG tablet Take 1 tablet (40 mg total) by mouth daily. 90 tablet 0   calcium  carbonate (TUMS - DOSED IN MG ELEMENTAL CALCIUM ) 500 MG chewable tablet Chew 1 tablet (200 mg of elemental calcium  total) by mouth 3 (three) times daily with meals. 90 tablet 0   docusate sodium  (COLACE) 100 MG capsule Take 1 capsule (100 mg total) by mouth 2 (two) times daily. 60 capsule 0   labetalol  (NORMODYNE ) 100 MG tablet Take 1.5 tablets (150 mg total) by mouth 2 (two) times daily. 270 tablet 3   oxyCODONE  (OXY IR/ROXICODONE ) 5 MG immediate release tablet Take 1 tablet (5 mg total) by mouth every 6 (six) hours as needed for moderate pain (pain score 4-6) or severe pain (pain score 7-10). 20 tablet 0   polyethylene glycol powder (GLYCOLAX /MIRALAX ) 17 GM/SCOOP powder Mix 1 capful (17 g) in 4-8 ounces in water/juice by mouth daily as needed (for constipation). 3350 g 1   spironolactone  (ALDACTONE ) 25 MG tablet Take 1 tablet (25 mg total) by mouth daily. 90 tablet 1   traZODone  (DESYREL ) 50 MG tablet Take 1 tablet (50 mg total) by mouth at bedtime. 90 tablet 0   Fremanezumab -vfrm (AJOVY ) 225 MG/1.5ML SOAJ Inject 225 mg into the skin every 28 (twenty-eight) days. 1.5 mL 5   No current facility-administered medications on file prior to visit.    Allergies  Allergen Reactions   Losartan  Other (See Comments)    Chest pain   Neomycin Sulfate Swelling, Rash and Other (See Comments)    Pain in ear, swelling and redness    Social History   Socioeconomic History   Marital status: Single    Spouse name: Not on file    Number of children: 3   Years of education: 10   Highest education level: 10th grade  Occupational History   Occupation: Unemployed  Tobacco Use   Smoking status: Former    Types: Cigarettes    Passive exposure: Past   Smokeless tobacco: Never   Tobacco comments:    Pt states she quit smoking 9 months ago. AB, CMA 06-25-2023  Vaping Use   Vaping status: Never Used  Substance and Sexual Activity   Alcohol use: Not Currently    Comment: occ   Drug use: No   Sexual activity: Yes    Partners: Male    Birth control/protection: Condom    Comment: single  Other Topics Concern   Not on file  Social History Narrative   Right Handed    Social Drivers of Health   Financial Resource Strain: Low Risk  (09/29/2022)   Overall Financial Resource Strain (CARDIA)    Difficulty of Paying Living Expenses: Not very hard  Food Insecurity: No Food Insecurity (02/18/2024)   Hunger Vital Sign  Worried About Programme researcher, broadcasting/film/video in the Last Year: Never true    Ran Out of Food in the Last Year: Never true  Transportation Needs: No Transportation Needs (02/18/2024)   PRAPARE - Administrator, Civil Service (Medical): No    Lack of Transportation (Non-Medical): No  Physical Activity: Insufficiently Active (09/29/2022)   Exercise Vital Sign    Days of Exercise per Week: 2 days    Minutes of Exercise per Session: 40 min  Stress: No Stress Concern Present (09/29/2022)   [employer] of Occupational Health - Occupational Stress Questionnaire    Feeling of Stress : Only a little  Social Connections: Unknown (09/29/2022)   Social Connection and Isolation Panel    Frequency of Communication with Friends and Family: More than three times a week    Frequency of Social Gatherings with Friends and Family: More than three times a week    Attends Religious Services: 1 to 4 times per year    Active Member of [employer] or Organizations: No    Attends Banker Meetings: Never    Marital  Status: Patient declined  Catering manager Violence: Not At Risk (02/18/2024)   Humiliation, Afraid, Rape, and Kick questionnaire    Fear of Current or Ex-Partner: No    Emotionally Abused: No    Physically Abused: No    Sexually Abused: No    Family History  Problem Relation Age of Onset   Hypertension Mother    Kidney disease Mother    Pancreatic cancer Father 10 - 32   Alcohol abuse Father    Depression Father    Drug abuse Father    Hypertension Father    Cancer Paternal Aunt        unknown type   Hypertension Maternal Grandmother    Stroke Maternal Grandmother    Dementia Maternal Grandmother    Alzheimer's disease Maternal Grandfather    Hypertension Daughter    Colon cancer Neg Hx    Esophageal cancer Neg Hx    Stomach cancer Neg Hx    Rectal cancer Neg Hx     Past Surgical History:  Procedure Laterality Date   CESAREAN SECTION     x2    ROS: Review of Systems Negative except as stated above  PHYSICAL EXAM: BP (!) 143/91 (BP Location: Left Arm, Patient Position: Sitting, Cuff Size: Normal)   Pulse 81   Temp 98.3 F (36.8 C) (Oral)   Ht 5' (1.524 m)   Wt 219 lb (99.3 kg)   LMP 07/20/2016   SpO2 95%   BMI 42.77 kg/m   Physical Exam  General appearance - alert, well appearing, older AAF and in no distress Mental status - pt is somewhat of poor historian but answers questions appropriately Neck - supple, no significant adenopathy Chest - clear to auscultation, no wheezes, rales or rhonchi, symmetric air entry Heart - normal rate, regular rhythm, normal S1, S2, no murmurs, rubs, clicks or gallops Extremities - no LE edema     02/22/2024    4:43 PM 10/22/2023    3:56 PM 08/21/2023    6:04 PM  Depression screen PHQ 2/9  Decreased Interest 3 2   Down, Depressed, Hopeless 3 3   PHQ - 2 Score 6 5   Altered sleeping 3 3   Tired, decreased energy 3 3   Change in appetite 3 3   Feeling bad or failure about yourself  3 3   Trouble concentrating 3  3    Moving slowly or fidgety/restless 3 2   Suicidal thoughts 2 3   PHQ-9 Score 26 25   Difficult doing work/chores Extremely dIfficult Very difficult      Information is confidential and restricted. Go to Review Flowsheets to unlock data.      02/22/2024    4:44 PM 10/22/2023    3:57 PM 02/16/2023    3:00 PM 12/29/2022    3:14 PM  GAD 7 : Generalized Anxiety Score  Nervous, Anxious, on Edge 3 2 2  0  Control/stop worrying 3 3 3 2   Worry too much - different things 3 3 3 2   Trouble relaxing 3 3 2 2   Restless 3 2 2  0  Easily annoyed or irritable 3 2 2 1   Afraid - awful might happen 3 3 2 1   Total GAD 7 Score 21 18 16 8   Anxiety Difficulty Extremely difficult Very difficult Somewhat difficult     Results for orders placed or performed in visit on 02/22/24  POCT glucose (manual entry)   Collection Time: 02/22/24  4:07 PM  Result Value Ref Range   POC Glucose 101 (A) 70 - 99 mg/dl  POCT glycosylated hemoglobin (Hb A1C)   Collection Time: 02/22/24  4:33 PM  Result Value Ref Range   Hemoglobin A1C     HbA1c POC (<> result, manual entry)     HbA1c, POC (prediabetic range)     HbA1c, POC (controlled diabetic range) 6.0 0.0 - 7.0 %        Latest Ref Rng & Units 02/19/2024    4:44 AM 02/18/2024    5:53 AM 02/17/2024    8:17 PM  CMP  Glucose 70 - 99 mg/dL 879   898   BUN 6 - 20 mg/dL 30   22   Creatinine 9.55 - 1.00 mg/dL 8.43  8.53  8.22   Sodium 135 - 145 mmol/L 136   138   Potassium 3.5 - 5.1 mmol/L 4.1   3.5   Chloride 98 - 111 mmol/L 97   101   CO2 22 - 32 mmol/L 20   26   Calcium  8.9 - 10.3 mg/dL 9.6   9.5   Total Protein 6.5 - 8.1 g/dL 7.5   7.8   Total Bilirubin 0.0 - 1.2 mg/dL 0.3   0.7   Alkaline Phos 38 - 126 U/L 103   113   AST 15 - 41 U/L 20   14   ALT 0 - 44 U/L 7   <5    Lipid Panel     Component Value Date/Time   CHOL 184 02/19/2024 0444   CHOL 181 05/01/2022 1559   TRIG 148 02/19/2024 0444   HDL 34 (L) 02/19/2024 0444   HDL 50 05/01/2022 1559   CHOLHDL  5.4 02/19/2024 0444   VLDL 30 02/19/2024 0444   LDLCALC 120 (H) 02/19/2024 0444   LDLCALC 106 (H) 05/01/2022 1559    CBC    Component Value Date/Time   WBC 15.7 (H) 02/19/2024 0444   RBC 4.90 02/19/2024 0444   HGB 11.3 (L) 02/19/2024 0444   HGB 12.0 01/26/2023 1515   HGB 14.9 05/01/2022 1559   HCT 38.9 02/19/2024 0444   HCT 47.3 (H) 05/01/2022 1559   PLT 300 02/19/2024 0444   PLT 342 01/26/2023 1515   PLT 299 05/01/2022 1559   MCV 79.4 (L) 02/19/2024 0444   MCV 78 (L) 05/01/2022 1559   MCH 23.1 (L) 02/19/2024 0444  MCHC 29.0 (L) 02/19/2024 0444   RDW 14.3 02/19/2024 0444   RDW 13.4 05/01/2022 1559   LYMPHSABS 3.4 08/21/2023 2222   LYMPHSABS 2.2 03/06/2019 1344   MONOABS 0.8 08/21/2023 2222   EOSABS 0.1 08/21/2023 2222   EOSABS 0.2 03/06/2019 1344   BASOSABS 0.1 08/21/2023 2222   BASOSABS 0.1 03/06/2019 1344    ASSESSMENT AND PLAN: 1. Hospital discharge follow-up (Primary)   2. Essential hypertension Not at goal Out of Spironolactone  which she plans to pick up today.  Continue Norvasc  and labetalol  - amLODipine  (NORVASC ) 10 MG tablet; Take 1 tablet (10 mg total) by mouth once nightly at bedtime.  Dispense: 90 tablet; Refill: 1  3. Coronary artery calcification Patient to keep appointment for coronary PET and cardiology appt thereafter. Pick up Lipitor and ASA today and start taking  4. CKD stage 3b, GFR 30-44 ml/min (HCC) Stable. Continue to monitor  5. Aneurysm of ascending aorta without rupture (HCC) Will need repeat imaging in 1 yr.  6. Major depressive disorder, severe (HCC) Continue current medications as prescribed by her psychiatrist including trazodone , Zoloft , Wellbutrin .  Referral submitted requesting a therapist - Ambulatory referral to Psychiatry  7. Microcytic anemia Noted on CBC. Will recheck CBC today with iron studies - Iron, TIBC and Ferritin Panel; Future - CBC; Future  8. Prediabetes Still in range for preDM. Encouraged healthy  eating - POCT glucose (manual entry) - POCT glycosylated hemoglobin (Hb A1C)  9. Influenza vaccination declined  10. In regards to her pain that started as CP, now in back and neck, seems MSK in nature. Plans to pick up Oxycodone  prescribed on hosp dischg. Advised that we do not prescribe this med for chronic pain. Once completed with current rxn, she may resume using here Tramadol  as needed that was previously prescribed by ortho.   Patient was given the opportunity to ask questions.  Patient verbalized understanding of the plan and was able to repeat key elements of the plan.   This documentation was completed using Paediatric nurse.  Any transcriptional errors are unintentional.  Orders Placed This Encounter  Procedures   Iron, TIBC and Ferritin Panel   CBC   Ambulatory referral to Psychiatry   POCT glucose (manual entry)   POCT glycosylated hemoglobin (Hb A1C)     Requested Prescriptions   Signed Prescriptions Disp Refills   amLODipine  (NORVASC ) 10 MG tablet 90 tablet 1    Sig: Take 1 tablet (10 mg total) by mouth once nightly at bedtime.   sertraline  (ZOLOFT ) 100 MG tablet 90 tablet 0    Sig: Take 1 tablet (100 mg total) by mouth daily.   buPROPion  (WELLBUTRIN  XL) 150 MG 24 hr tablet 90 tablet 0    Sig: Take 1 tablet (150 mg total) by mouth daily.   Vitamin D , Ergocalciferol , (DRISDOL ) 1.25 MG (50000 UNIT) CAPS capsule 13 capsule 4    Sig: Take 1 capsule (50,000 Units total) by mouth once a week.   pantoprazole  (PROTONIX ) 40 MG tablet 90 tablet 0    Sig: Take 1 tablet (40 mg total) by mouth daily.    Return in about 3 months (around 05/23/2024).  Barnie Louder, MD, FACP

## 2024-02-22 NOTE — Patient Instructions (Signed)
 VISIT SUMMARY:  You had a follow-up appointment today after your recent hospitalization for chest pain. During your hospital stay, tests showed no heart attack but did reveal calcifications in your coronary arteries and an aortic aneurysm. You were prescribed new medications and advised to start a low-dose aspirin  regimen. You also discussed your ongoing pain, blood pressure management, chronic kidney disease, and mental health needs.  YOUR PLAN:  -CORONARY ARTERY CALCIFICATION: Coronary artery calcification means there are calcium  deposits in the walls of your coronary arteries, which can affect blood flow to your heart. You need to complete a cardiac study on February 27, 2024, and follow up with your cardiologist on March 19, 2024. Start taking atorvastatin  40 mg daily and a low-dose aspirin . Please pick up these medications from the pharmacy.  -ESSENTIAL HYPERTENSION: Essential hypertension is high blood pressure with no identifiable cause. Your blood pressure today was 153/107 mmHg. Continue taking labetalol  100 mg (1.5 tablets twice daily) and amlodipine  10 mg daily. Please pick up and resume taking spironolactone  as prescribed.  -STAGE 3B CHRONIC KIDNEY DISEASE: Stage 3b chronic kidney disease means your kidneys are moderately damaged and not working as well as they should. Continue to avoid NSAIDs like Naprosyn , Aleve , and Advil  to prevent further kidney damage.  -ASCENDING AORTIC ANEURYSM: An ascending aortic aneurysm is a bulge in the upper part of the aorta, the main artery that carries blood from your heart. You will need a repeat CT scan in one year to monitor its size. Surgery may be needed if it gets larger.  -CHRONIC PAIN, MULTIFOCAL: Chronic pain, multifocal means you have ongoing pain in multiple areas of your body. You are currently managing this with tramadol  and oxycodone . Finish your current oxycodone  prescription and then resume taking tramadol .  -MIGRAINE: A migraine is a  type of headache that can cause severe pain, often accompanied by nausea and sensitivity to light and sound. You were previously on Ubrelvy  and are now prescribed Ajovy  injections. Please start the Ajovy  injections as prescribed.  -OBSTRUCTIVE AIRWAY DISEASE: Obstructive airway disease means your airways are narrowed, making it hard to breathe. Continue using your inhaler as needed.  -DEPRESSION: Depression is a mood disorder that causes persistent feelings of sadness and loss of interest. You are currently taking trazodone  and bupropion . A referral for therapy will be submitted, and you should follow up with your psychiatrist at [facility]. Seek emergency care if you experience thoughts of self-harm.  INSTRUCTIONS:  Please complete your cardiac study on February 27, 2024, and attend your follow-up appointment with the cardiologist on March 19, 2024. Pick up your prescriptions for atorvastatin , aspirin , and spironolactone  from the pharmacy. Schedule a repeat CT scan in one year to monitor your aortic aneurysm. A referral for therapy will be submitted, and continue to follow up with your psychiatrist at [facility]. If you experience any thoughts of self-harm, seek emergency care immediately.

## 2024-02-24 ENCOUNTER — Encounter: Payer: Self-pay | Admitting: Internal Medicine

## 2024-02-25 ENCOUNTER — Encounter (HOSPITAL_COMMUNITY): Payer: Self-pay

## 2024-02-25 ENCOUNTER — Other Ambulatory Visit: Payer: Self-pay

## 2024-02-25 ENCOUNTER — Other Ambulatory Visit (HOSPITAL_COMMUNITY): Payer: Self-pay | Admitting: *Deleted

## 2024-02-25 DIAGNOSIS — R072 Precordial pain: Secondary | ICD-10-CM

## 2024-02-26 ENCOUNTER — Telehealth (HOSPITAL_COMMUNITY): Payer: Self-pay | Admitting: *Deleted

## 2024-02-26 ENCOUNTER — Other Ambulatory Visit: Payer: Self-pay

## 2024-02-26 NOTE — Telephone Encounter (Signed)
 Attempted to call patient regarding upcoming cardiac PET appointment. Left message on voicemail with name and callback number Johney Frame RN Navigator Cardiac Imaging [facility] [phone] Office

## 2024-02-26 NOTE — Telephone Encounter (Signed)
 Patient returning call about her upcoming cardiac imaging study; pt verbalizes understanding of appt date/time, parking situation and where to check in, pre-test NPO status, and verified current allergies; name and call back number provided for further questions should they arise  Chantal Requena RN Navigator Cardiac Imaging [facility] [phone] office [phone] cell  Patient aware to avoid caffeine  12 hours prior to her cardiac PET study.

## 2024-02-27 ENCOUNTER — Ambulatory Visit (HOSPITAL_COMMUNITY)
Admission: RE | Admit: 2024-02-27 | Discharge: 2024-02-27 | Disposition: A | Discharge status: Home / Self Care | Source: Ambulatory Visit | Attending: Cardiology | Admitting: Cardiology

## 2024-02-27 DIAGNOSIS — R072 Precordial pain: Secondary | ICD-10-CM | POA: Diagnosis present

## 2024-02-27 LAB — NM PET CT CARDIAC PERFUSION MULTI W/ABSOLUTE BLOODFLOW
MBFR: 1.95
Nuc Rest EF: 58 %
Nuc Stress EF: 68 %
Rest MBF: 1.19 ml/g/min
Rest Nuclear Isotope Dose: 25.8 mCi
ST Depression (mm): 0 mm
Stress MBF: 2.32 ml/g/min
Stress Nuclear Isotope Dose: 25.8 mCi

## 2024-02-27 MED ORDER — RUBIDIUM RB82 GENERATOR (RUBYFILL)
26.5000 | PACK | Freq: Once | INTRAVENOUS | Status: AC
  Administered 2024-02-27: 25.77 via INTRAVENOUS

## 2024-02-27 MED ORDER — REGADENOSON 0.4 MG/5ML IV SOLN
0.4000 mg | Freq: Once | INTRAVENOUS | Status: AC
  Administered 2024-02-27: 0.4 mg via INTRAVENOUS

## 2024-02-27 MED ORDER — REGADENOSON 0.4 MG/5ML IV SOLN
INTRAVENOUS | Status: AC
Start: 2024-02-27 — End: 2024-02-27
  Filled 2024-02-27: qty 5

## 2024-03-04 ENCOUNTER — Ambulatory Visit: Payer: Self-pay | Admitting: Cardiology

## 2024-03-04 ENCOUNTER — Other Ambulatory Visit: Payer: Self-pay

## 2024-03-04 DIAGNOSIS — I77819 Aortic ectasia, unspecified site: Secondary | ICD-10-CM

## 2024-03-05 ENCOUNTER — Telehealth: Payer: Self-pay | Admitting: Cardiology

## 2024-03-05 NOTE — Telephone Encounter (Signed)
  Per answering service message:  Particia @ [employer]  Procedure Code [id number] is not managed through [employer], will have to submit to health plan

## 2024-03-06 ENCOUNTER — Other Ambulatory Visit: Payer: Self-pay

## 2024-03-18 NOTE — Progress Notes (Unsigned)
 Cardiology Office Note    Date:  03/19/2024  ID:  Janice [name], DOB 1966/03/03, MRN [id number] PCP:  Vicci Barnie NOVAK, MD  Cardiologist:  Lynwood Schilling, MD  Electrophysiologist:  None   Chief Complaint: f/u stress test  History of Present Illness: Janice [name]    Janice [name] is a 58 y.o. female with visit-pertinent history of HTN, CKD IIIb, Vit D deficiency, morbid obesity, GERD, GAD, OSA on CPAP, anemia by labs, coronary calcification by CT, ascending TAA seen for follow-up. She has followed with Dr. Schilling for general cardiology and the advanced HTN clinic for her high blood pressure. She was recently admitted with SOB, productive cough, flank pain, and constant chest pain with mixed features. Troponins were negative. CTA ruled out PE but showed moderate multivessel coronary calcification, aortic atherosclerosis, possible pHTN, and ascending TAA. Echo showed EF 60-65%, g1DD, normal RV. Outpatient cardiac PET showed no ischemia, no infarction, + mild decrease in myocardial blood flow reserve which can be seen in microvascular disease, mildly abnormal global myocardial blood flow, intermediate risk study. Dr. Schilling felt study did not suggest ischemia. He did not recommend any additional testing. Overread showed 4cm ascending TAA, mildly prominent PA, aortic atherosclerosis, atx/scarring LLB, spondylosis.  She returns for follow-up overall feeling about the same. She continues to have chronic chest pain which she states was only relieved with oxycodone  in the hospital. This also coincides with flank pain, back pain, and knee pain occurring all at the same time. Note these pains started before statin initiation. It seems to be worse with activity but also specific movements as well. She states that PCP discussed potentially referring her to pain management. No rest pain. Blood pressure remains suboptimally controlled, 138/96 on intake then 140/96 by me. She otherwise denies any other new  symptoms.  Labwork independently reviewed: 02/2024 LDL 120, trig 148, Hgb 11.3, plt 300, K 4.1, Cr 1.56, LFTs ok, trop neg, d-dimer 0.61.   ROS: .    Please see the history of present illness.  All other systems are reviewed and otherwise negative.  Studies Reviewed: Janice [name]    EKG:  EKG ordered and personally reviewed, demonstrating   EKG Interpretation Date/Time:  Wednesday March 19 2024 10:50:49 EDT Ventricular Rate:  63 PR Interval:  186 QRS Duration:  86 QT Interval:  448 QTC Calculation: 458 R Axis:   -12  Text Interpretation: Normal sinus rhythm Cannot rule out Anterior infarct , age undetermined  Nonspecific TW changes similar to prior Confirmed by Marianny Goris [phone]) on 03/19/2024 11:06:55 AM    CV Studies: Cardiac studies reviewed are outlined and summarized above. Otherwise please see EMR for full report.   Current Reported Medications:.    Current Meds  Medication Sig   albuterol  (VENTOLIN  HFA) 108 (90 Base) MCG/ACT inhaler Inhale 2 puffs into the lungs every 6 (six) hours as needed for wheezing or shortness of breath.   amLODipine  (NORVASC ) 10 MG tablet Take 1 tablet (10 mg total) by mouth once nightly at bedtime.   aspirin  EC 81 MG tablet Take 1 tablet (81 mg total) by mouth daily. Swallow whole.   atorvastatin  (LIPITOR) 40 MG tablet Take 1 tablet (40 mg total) by mouth daily.   buPROPion  (WELLBUTRIN  XL) 150 MG 24 hr tablet Take 1 tablet (150 mg total) by mouth daily.   calcium  carbonate (TUMS - DOSED IN MG ELEMENTAL CALCIUM ) 500 MG chewable tablet Chew 1 tablet (200 mg of elemental calcium  total) by mouth 3 (three) times  daily with meals. (Patient taking differently: Chew 1 tablet by mouth 3 (three) times daily as needed.)   docusate sodium  (COLACE) 100 MG capsule Take 1 capsule (100 mg total) by mouth 2 (two) times daily.   labetalol  (NORMODYNE ) 100 MG tablet Take 1.5 tablets (150 mg total) by mouth 2 (two) times daily.   pantoprazole  (PROTONIX ) 40 MG tablet Take  1 tablet (40 mg total) by mouth daily.   polyethylene glycol powder (GLYCOLAX /MIRALAX ) 17 GM/SCOOP powder Mix 1 capful (17 g) in 4-8 ounces in water/juice by mouth daily as needed (for constipation).   sertraline  (ZOLOFT ) 100 MG tablet Take 1 tablet (100 mg total) by mouth daily.   spironolactone  (ALDACTONE ) 25 MG tablet Take 1 tablet (25 mg total) by mouth daily.   traZODone  (DESYREL ) 50 MG tablet Take 1 tablet (50 mg total) by mouth at bedtime.   Vitamin D , Ergocalciferol , (DRISDOL ) 1.25 MG (50000 UNIT) CAPS capsule Take 1 capsule (50,000 Units total) by mouth once a week.    Physical Exam:    VS:  BP (!) 138/96   Pulse 72   Ht 5' (1.524 m)   Wt 226 lb (102.5 kg)   LMP 07/20/2016   BMI 44.14 kg/m    Wt Readings from Last 3 Encounters:  03/19/24 226 lb (102.5 kg)  02/22/24 219 lb (99.3 kg)  02/18/24 218 lb 11.1 oz (99.2 kg)    GEN: Well nourished, well developed in no acute distress NECK: No JVD; No carotid bruits CARDIAC: RRR, no murmurs, rubs, gallops RESPIRATORY:  Clear to auscultation without rales, wheezing or rhonchi  ABDOMEN: Soft, non-tender, non-distended EXTREMITIES:  No edema; No acute deformity   Asessement and Plan:.    1. CAD, possible microvascular disease, chest pain of uncertain etiology - she continues to have chest pain but this occurs in the context of multiple other bodily pains including flank pain, back pain, knee pain, worsened with movement. Will defer management of noncardiac pains to primary care. She reports PCP has recommended she see pain management. From cardiac standpoint, Dr. Lavona felt that stress test did not suggest significant ischemia. There was question of microvascular angina, though troponins were negative in the hospital despite several days of constant pain, arguing against acute ischemia. I do think she would benefit from improved blood pressure control and trial of antianginal therapy. We will increase labetalol  to 200mg  BID and add  Imdur 15mg  nightly (has hx of headaches). She has HTN clinic f/u 10/30 which she would like to keep; will arrange 6 week gen cards f/u with us .  2. Hyperlipidemia - started on atorvastatin  during hospitalization 02/2024, too early to recheck lipids but will get CMET with labs planned. Recommend review plan for lipid recheck at next OV.  3. Ascending TAA - discussed aneurysm precautions with patient, outlined on AVS. Will continue to work on BP as above. Dr. Lavona has ordered a CT angio of the chest to be done in 1 year. She will need BMET in anticipation of this; if any worsening of kidney function would consider MR angio instead.   4. Essential HTN - see above regarding medication adjustments. Keep f/u with HTN clinic 10/30 per patient request.  5. CKD 3b, anemia - recheck labs today. Will also get the ferritin, iron, TIBC that PCP had originally planned.     Disposition: F/u with Reche Finder with HTN team 10/30, gen cards team 6 weeks (me/APP/Dr. Lavona).  Signed, Kaylum Shrum N Viggo Perko, PA-C

## 2024-03-19 ENCOUNTER — Other Ambulatory Visit: Payer: Self-pay

## 2024-03-19 ENCOUNTER — Ambulatory Visit: Attending: Cardiovascular Disease | Admitting: Physician Assistant

## 2024-03-19 ENCOUNTER — Encounter: Payer: Self-pay | Admitting: Physician Assistant

## 2024-03-19 VITALS — BP 138/96 | HR 72 | Ht 60.0 in | Wt 226.0 lb

## 2024-03-19 DIAGNOSIS — E785 Hyperlipidemia, unspecified: Secondary | ICD-10-CM | POA: Insufficient documentation

## 2024-03-19 DIAGNOSIS — I10 Essential (primary) hypertension: Secondary | ICD-10-CM | POA: Insufficient documentation

## 2024-03-19 DIAGNOSIS — I7121 Aneurysm of the ascending aorta, without rupture: Secondary | ICD-10-CM | POA: Insufficient documentation

## 2024-03-19 DIAGNOSIS — I2510 Atherosclerotic heart disease of native coronary artery without angina pectoris: Secondary | ICD-10-CM | POA: Insufficient documentation

## 2024-03-19 DIAGNOSIS — N1832 Chronic kidney disease, stage 3b: Secondary | ICD-10-CM | POA: Diagnosis present

## 2024-03-19 DIAGNOSIS — R072 Precordial pain: Secondary | ICD-10-CM | POA: Diagnosis present

## 2024-03-19 DIAGNOSIS — D509 Iron deficiency anemia, unspecified: Secondary | ICD-10-CM | POA: Diagnosis present

## 2024-03-19 MED ORDER — ISOSORBIDE MONONITRATE ER 30 MG PO TB24
15.0000 mg | ORAL_TABLET | Freq: Every day | ORAL | 3 refills | Status: AC
Start: 2024-03-19 — End: 2024-06-17
  Filled 2024-03-19 (×2): qty 45, 90d supply, fill #0

## 2024-03-19 MED ORDER — LABETALOL HCL 200 MG PO TABS
200.0000 mg | ORAL_TABLET | Freq: Two times a day (BID) | ORAL | 3 refills | Status: AC
  Filled 2024-03-19 (×2): qty 180, 90d supply, fill #0

## 2024-03-19 NOTE — Patient Instructions (Signed)
 Medication Instructions:   INCREASE LABETALOL  TO 200 MG TWICE DAILY= 2 OF THE 100 MG TABLETS TWICE DAILY  START ISOSORBIDE 15 MG ONCE DAILY AT BEDTIME= 1/2 OF THE 30 MG TABLET ONCE DAILY AT BEDTIME   *If you need a refill on your cardiac medications before your next appointment, please call your pharmacy*  Lab Work:  Your physician recommends that you HAVE LAB WORK TODAY  If you have labs (blood work) drawn today and your tests are completely normal, you will receive your results only by: MyChart Message (if you have MyChart) OR A paper copy in the mail If you have any lab test that is abnormal or we need to change your treatment, we will call you to review the results.    Follow-Up: At [facility], you and your health needs are our priority.  As part of our continuing mission to provide you with exceptional heart care, our providers are all part of one team.  This team includes your primary Cardiologist (physician) and Advanced Practice Providers or APPs (Physician Assistants and Nurse Practitioners) who all work together to provide you with the care you need, when you need it.  Your next appointment:   6 week(s)  Provider:    DAYNA DUNN PAS OR LYNWOOD SCHILLING MD    Information About Your Aneurysm  One of your tests has shown an aneurysm of your aorta. The word aneurysm refers to a bulge in an artery (blood vessel). Most people think of them in the context of an emergency, but yours was found incidentally. At this point there is nothing you need to do from a procedure standpoint, but there are some important things to keep in mind for day-to-day life.  Mainstays of therapy for aneurysms include very good blood pressure control, healthy lifestyle, and avoiding tobacco products and street drugs. Research has raised concern that antibiotics in the fluoroquinolone class could be associated with increased risk of having an aneurysm develop or tear. This includes medicines  that end in floxacin, like Cipro or Levaquin. Make sure to discuss this information with other healthcare providers if you require antibiotics.  Since aneurysms can run in families, you should discuss your diagnosis with first degree relatives as they may need to be screened for this. Regular mild-moderate physical exercise is important, but avoid heavy lifting/weight lifting over 30lbs, chopping wood, shoveling snow or digging heavy earth with a shovel. It is best to avoid activities that cause grunting or straining (medically referred to as a Valsalva maneuver). This happens when a person bears down against a closed throat to increase the strength of arm or abdominal muscles. There's often a tendency to do this when lifting heavy weights, doing sit-ups, push-ups or chin-ups, etc., but it may be harmful.  This is a finding I would expect to be monitored periodically by your cardiology team. Most unruptured thoracic aortic aneurysms cause no symptoms, so they are often found during exams for other conditions. Contact a health care provider if you develop any discomfort in your upper back, neck, abdomen, trouble swallowing, cough or hoarseness, or unexplained weight loss. Get help right away if you develop severe pain in your upper back or abdomen that may move into your chest and arms, or any other concerning symptoms such as shortness of breath or fever.

## 2024-03-20 ENCOUNTER — Other Ambulatory Visit: Payer: Self-pay

## 2024-03-20 ENCOUNTER — Ambulatory Visit: Payer: Self-pay | Admitting: Physician Assistant

## 2024-03-20 ENCOUNTER — Telehealth: Payer: Self-pay | Admitting: Internal Medicine

## 2024-03-20 DIAGNOSIS — G8929 Other chronic pain: Secondary | ICD-10-CM

## 2024-03-20 LAB — IRON,TIBC AND FERRITIN PANEL
Ferritin: 190 ng/mL — ABNORMAL HIGH (ref 15–150)
Iron Saturation: 16 % (ref 15–55)
Iron: 46 ug/dL (ref 27–159)
Total Iron Binding Capacity: 292 ug/dL (ref 250–450)
UIBC: 246 ug/dL (ref 131–425)

## 2024-03-20 LAB — CBC
Hematocrit: 38.5 % (ref 34.0–46.6)
Hemoglobin: 11.4 g/dL (ref 11.1–15.9)
MCH: 23.7 pg — ABNORMAL LOW (ref 26.6–33.0)
MCHC: 29.6 g/dL — ABNORMAL LOW (ref 31.5–35.7)
MCV: 80 fL (ref 79–97)
Platelets: 309 x10E3/uL (ref 150–450)
RBC: 4.81 x10E6/uL (ref 3.77–5.28)
RDW: 13.4 % (ref 11.7–15.4)
WBC: 11 x10E3/uL — ABNORMAL HIGH (ref 3.4–10.8)

## 2024-03-20 NOTE — Addendum Note (Signed)
 Addended by: VICCI SOBER B on: 03/20/2024 11:13 PM   Modules accepted: Orders

## 2024-03-20 NOTE — Telephone Encounter (Signed)
 Copied from CRM [id number]. Topic: Referral - Request for Referral >> Mar 20, 2024  9:33 AM Tiffini S wrote:  Did the patient discuss referral with their provider in the last year? Yes (If No - schedule appointment) (If Yes - send message)  Appointment offered? Yes  Type of order/referral and detailed reason for visit: Pain management for back/ side pain in [place]  Preference of office, provider, location: N/A  If referral order, have you been seen by this specialty before? No (If Yes, this issue or another issue? When? Where?  Can we respond through MyChart? No, please call the patient at [phone]

## 2024-03-27 ENCOUNTER — Other Ambulatory Visit: Payer: Self-pay

## 2024-03-31 ENCOUNTER — Encounter (HOSPITAL_BASED_OUTPATIENT_CLINIC_OR_DEPARTMENT_OTHER): Payer: Self-pay

## 2024-03-31 ENCOUNTER — Telehealth: Payer: Self-pay | Admitting: Internal Medicine

## 2024-03-31 NOTE — Telephone Encounter (Signed)
 Copied from CRM [id number]. Topic: Clinical - Lab/Test Results >> Mar 31, 2024 10:50 AM Montie POUR wrote:  Reason for CRM:  Please have a nurse call [facility] to discuss if lab results. She wants to know if she needs to take something for her iron level. Her number is [phone] She did make an appointment for 04/03/24 to have her labs rechecked. If this appointment is to soon please let know.

## 2024-04-02 ENCOUNTER — Other Ambulatory Visit: Payer: Self-pay

## 2024-04-02 MED ORDER — BUPROPION HCL ER (XL) 300 MG PO TB24
300.0000 mg | ORAL_TABLET | Freq: Every day | ORAL | 0 refills | Status: AC
Start: 2024-04-02 — End: ?
  Filled 2024-04-02: qty 30, 30d supply, fill #0

## 2024-04-02 MED ORDER — TRAZODONE HCL 100 MG PO TABS
100.0000 mg | ORAL_TABLET | Freq: Every day | ORAL | 0 refills | Status: AC
Start: 2024-04-02 — End: ?
  Filled 2024-04-02: qty 30, 30d supply, fill #0

## 2024-04-02 NOTE — Telephone Encounter (Signed)
 Called but no answer. LVM to call back.

## 2024-04-02 NOTE — Telephone Encounter (Signed)
 She has a mild stable anemia associated with her chronic kidney disease. Hold off on taking iron. Come to lab end of Nov for recheck not 04/03/24. Please cancel that lab appt.

## 2024-04-03 ENCOUNTER — Other Ambulatory Visit: Payer: Self-pay

## 2024-04-03 ENCOUNTER — Encounter (HOSPITAL_BASED_OUTPATIENT_CLINIC_OR_DEPARTMENT_OTHER): Payer: Self-pay | Admitting: Family

## 2024-04-03 ENCOUNTER — Ambulatory Visit (INDEPENDENT_AMBULATORY_CARE_PROVIDER_SITE_OTHER): Admitting: Family

## 2024-04-03 ENCOUNTER — Ambulatory Visit

## 2024-04-03 VITALS — BP 128/80 | HR 87 | Ht 60.0 in | Wt 223.8 lb

## 2024-04-03 DIAGNOSIS — I7121 Aneurysm of the ascending aorta, without rupture: Secondary | ICD-10-CM

## 2024-04-03 DIAGNOSIS — I10 Essential (primary) hypertension: Secondary | ICD-10-CM

## 2024-04-03 DIAGNOSIS — I25118 Atherosclerotic heart disease of native coronary artery with other forms of angina pectoris: Secondary | ICD-10-CM

## 2024-04-03 NOTE — Patient Instructions (Addendum)
 Medication Instructions:  CHANGE Isosorbide (Imdur) to HALF tablet once per day    Follow-Up: As scheduled with Dr. Lavona AND Please follow up in 6 months in [facility] with Dr. Raford, Reche Finder, NP or Allean Mink PharmD    Special Instructions:    If your acid reflux continues to be bothersome, recommend talking to primary care provider to see if it would beneficial to see gastroenterology.   Heart Healthy Diet Recommendations: A low-salt diet is recommended. Meats should be grilled, baked, or boiled. Avoid fried foods. Focus on lean protein sources like fish or chicken with vegetables and fruits. The American [employer] Association is a Chief Technology Officer! Simply google 'American Heart Association Recipes' American Heart Association Diet and Lifeystyle Recommendations

## 2024-04-03 NOTE — Progress Notes (Signed)
 [facility] Initial Assessment:    Date:  04/07/2024   ID:  Janice [name], DOB 05-21-1966, MRN [id number]  PCP:  Vicci Barnie NOVAK, MD  Cardiologist:  Lynwood Schilling, MD  Nephrologist: Dr. Howell ([facility])  Referring MD: Vicci Barnie NOVAK, MD   CC: Hypertension  History of Present Illness:    Janice [name] is a 58 y.o. female with a hx of HTN, CKDIII, vitamin D  deficiency, morbid obesity  here to establish care in the [facility].   Established with [facility] 08/16/23 after referral by her PCP.  There was concern about a/ARB due to hyperkalemia though on my review isolated elevated potassium 05/01/2022 of 5.3 with labs 10 days later K4.1 raising question of hemolysis of initial sample causing phosphate elevated potassium.  Since that time potassium 3.9-4.1.  Prior to that 2016-2022 persistent hypokalemia..  Initial diagnosis hypertension at 58 year old and started antihypertensive regimen after pregnancy.  Family history of hypertension in her daughter, granddaughter.  She was using arm cuff at home with BP 120-130s over 90s.  She previously quit smoking tobacco 06/2023.  Alcohol use only socially sparingly.  She was walking working in yard for exercise and following a low-sodium diet.  Her labetalol  was increased to 150 mg twice daily for diastolic BP lowering.  Renal artery duplex 09/14/2023 with no evidence of renal artery stenosis.  Prior admission in March to [facility].   Admission 02/2024 with SOB, productive cough, flank pain, chest pain. CTA ruled out PE but did note multivessel coronary calcification. Echo LVEF 60-65%, gr1dd, normal RV. Outpatient PET with no ischemia, mild decrease in myocardial blood flow reserve which can be seen in microvascular disease. Overread with 4cm ascending TAA, mildly prominent PA, aortic atherosclerosis, atx/scarring LLB, spondylosis.   03/19/24 seen by Dayna Dunn, PA with Labetolol  increased to 200mg  BID and Imdur 15mg  added for antianginal benefit.   Presents today for follow up. Reports her chest pain is about the same. It is occurring daily at rest. For example, had some pain that did wake her up from sleep. Reviewed reassuring cardiac tests. Also notes sensation of air on her chest and wonders if her GERD is contributory to chest pain. Recently established with pain management clinic, declined injections, has been referred to a new clinic for non-invasive measures. She is eating at home, following low salt diet. She has been stretching for exercise. Reports BP at home has been good, BP yesterday at pain clinic 124/78. Reports her headaches are about the same as previous though has been taking whole tablet of Imdur instead of prescribed half tablet.   Previous antihypertensives: Losartan  - chest pain  Past Medical History:  Diagnosis Date   Esophageal reflux 08/02/2006   Essential hypertension, benign 08/02/2006   Generalized anxiety disorder    Hypertensive urgency 07/10/2022   Leukocytosis 05/26/2022   Major depressive disorder 05/31/2021   Migraine with aura and without status migrainosus, not intractable 05/31/2021   Morbid obesity with body mass index (BMI) of 40.0 to 44.9 in adult 12/13/2021   OSA (obstructive sleep apnea) 06/01/2022   NPSG 07/04/22- AHI 63.4/hr, desaturation to 66%/mean 89.7%, body      Pap smear abnormality of cervix/human papillomavirus (HPV) positive 11/06/2019   Perimenopausal 03/06/2019   Polycythemia, secondary 05/26/2022   Prediabetes 04/04/2019   Primary osteoarthritis of right knee 12/13/2021   Psoriasis 03/06/2019   Stage 3b chronic kidney disease 05/03/2022   Trichomonas infection 11/06/2019  Vitamin D  deficiency 09/29/2015    Past Surgical History:  Procedure Laterality Date   CESAREAN SECTION     x2    Current Medications: Current Meds  Medication Sig   albuterol  (VENTOLIN  HFA) 108 (90 Base) MCG/ACT inhaler Inhale  2 puffs into the lungs every 6 (six) hours as needed for wheezing or shortness of breath.   amLODipine  (NORVASC ) 10 MG tablet Take 1 tablet (10 mg total) by mouth once nightly at bedtime.   aspirin  EC 81 MG tablet Take 1 tablet (81 mg total) by mouth daily. Swallow whole.   atorvastatin  (LIPITOR) 40 MG tablet Take 1 tablet (40 mg total) by mouth daily.   buPROPion  (WELLBUTRIN  XL) 150 MG 24 hr tablet Take 1 tablet (150 mg total) by mouth daily.   buPROPion  (WELLBUTRIN  XL) 300 MG 24 hr tablet Take 1 tablet (300 mg total) by mouth daily.   calcium  carbonate (TUMS - DOSED IN MG ELEMENTAL CALCIUM ) 500 MG chewable tablet Chew 1 tablet (200 mg of elemental calcium  total) by mouth 3 (three) times daily with meals.   docusate sodium  (COLACE) 100 MG capsule Take 1 capsule (100 mg total) by mouth 2 (two) times daily.   famotidine  (PEPCID ) 20 MG tablet Take 20 mg by mouth daily.   Fremanezumab -vfrm (AJOVY ) 225 MG/1.5ML SOAJ Inject 225 mg into the skin every 28 (twenty-eight) days.   isosorbide mononitrate (IMDUR) 30 MG 24 hr tablet Take 0.5 tablets (15 mg total) by mouth daily.   labetalol  (NORMODYNE ) 200 MG tablet Take 1 tablet (200 mg total) by mouth 2 (two) times daily.   pantoprazole  (PROTONIX ) 40 MG tablet Take 1 tablet (40 mg total) by mouth daily.   polyethylene glycol powder (GLYCOLAX /MIRALAX ) 17 GM/SCOOP powder Mix 1 capful (17 g) in 4-8 ounces in water/juice by mouth daily as needed (for constipation).   sertraline  (ZOLOFT ) 100 MG tablet Take 1 tablet (100 mg total) by mouth daily.   spironolactone  (ALDACTONE ) 25 MG tablet Take 1 tablet (25 mg total) by mouth daily.   traZODone  (DESYREL ) 100 MG tablet Take 1 tablet (100 mg total) by mouth at bedtime.   traZODone  (DESYREL ) 50 MG tablet Take 1 tablet (50 mg total) by mouth at bedtime.   Vitamin D , Ergocalciferol , (DRISDOL ) 1.25 MG (50000 UNIT) CAPS capsule Take 1 capsule (50,000 Units total) by mouth once a week.     Allergies:   Losartan  and  Neomycin sulfate   Social History   Socioeconomic History   Marital status: Single    Spouse name: Not on file   Number of children: 3   Years of education: 10   Highest education level: 10th grade  Occupational History   Occupation: Unemployed  Tobacco Use   Smoking status: Former    Types: Cigarettes    Passive exposure: Past   Smokeless tobacco: Never   Tobacco comments:    Pt states she quit smoking 9 months ago. AB, CMA 06-25-2023  Vaping Use   Vaping status: Never Used  Substance and Sexual Activity   Alcohol use: Not Currently    Comment: occ   Drug use: No   Sexual activity: Yes    Partners: Male    Birth control/protection: Condom    Comment: single  Other Topics Concern   Not on file  Social History Narrative   Right Handed    Social Drivers of Health   Financial Resource Strain: Low Risk  (09/29/2022)   Overall Financial Resource Strain (CARDIA)    Difficulty  of Paying Living Expenses: Not very hard  Food Insecurity: No Food Insecurity (02/18/2024)   Hunger Vital Sign    Worried About Running Out of Food in the Last Year: Never true    Ran Out of Food in the Last Year: Never true  Transportation Needs: No Transportation Needs (02/18/2024)   PRAPARE - Administrator, Civil Service (Medical): No    Lack of Transportation (Non-Medical): No  Physical Activity: Insufficiently Active (09/29/2022)   Exercise Vital Sign    Days of Exercise per Week: 2 days    Minutes of Exercise per Session: 40 min  Stress: No Stress Concern Present (09/29/2022)   [employer] of Occupational Health - Occupational Stress Questionnaire    Feeling of Stress : Only a little  Social Connections: Unknown (09/29/2022)   Social Connection and Isolation Panel    Frequency of Communication with Friends and Family: More than three times a week    Frequency of Social Gatherings with Friends and Family: More than three times a week    Attends Religious Services: 1 to 4 times  per year    Active Member of [employer] or Organizations: No    Attends Engineer, Structural: Never    Marital Status: Patient declined     Family History: The patient's family history includes Alcohol abuse in her father; Alzheimer's disease in her maternal grandfather; Cancer in her paternal aunt; Dementia in her maternal grandmother; Depression in her father; Drug abuse in her father; Hypertension in her daughter, father, maternal grandmother, and mother; Kidney disease in her mother; Pancreatic cancer (age of onset: 83 - 68) in her father; Stroke in her maternal grandmother. There is no history of Colon cancer, Esophageal cancer, Stomach cancer, or Rectal cancer.  ROS:   Please see the history of present illness.     All other systems reviewed and are negative.  EKGs/Labs/Other Studies Reviewed:         Recent Labs: 02/19/2024: ALT 7; BUN 30; Creatinine, Ser 1.56; Potassium 4.1; Sodium 136 03/19/2024: Hemoglobin 11.4; Platelets 309   Recent Lipid Panel    Component Value Date/Time   CHOL 184 02/19/2024 0444   CHOL 181 05/01/2022 1559   TRIG 148 02/19/2024 0444   HDL 34 (L) 02/19/2024 0444   HDL 50 05/01/2022 1559   CHOLHDL 5.4 02/19/2024 0444   VLDL 30 02/19/2024 0444   LDLCALC 120 (H) 02/19/2024 0444   LDLCALC 106 (H) 05/01/2022 1559    Physical Exam:   VS:  BP 128/80   Pulse 87   Ht 5' (1.524 m)   Wt 223 lb 12.8 oz (101.5 kg)   LMP 07/20/2016   SpO2 92%   BMI 43.71 kg/m  , BMI Body mass index is 43.71 kg/m.  Vitals:   04/03/24 1538 04/03/24 1605  BP: (!) 145/86 128/80  Pulse: 87   Height: 5' (1.524 m)   Weight: 223 lb 12.8 oz (101.5 kg)   SpO2: 92%   BMI (Calculated): 43.71     GENERAL:  Well appearing HEENT: Pupils equal round and reactive, fundi not visualized, oral mucosa unremarkable NECK:  No jugular venous distention, waveform within normal limits, carotid upstroke brisk and symmetric, no bruits, no thyromegaly LYMPHATICS:  No cervical  adenopathy LUNGS:  Clear to auscultation bilaterally HEART:  RRR.  PMI not displaced or sustained,S1 and S2 within normal limits, no S3, no S4, no clicks, no rubs, no murmurs ABD:  Flat, positive bowel sounds normal in frequency  in pitch, no bruits, no rebound, no guarding, no midline pulsatile mass, no hepatomegaly, no splenomegaly EXT:  2 plus pulses throughout, no edema, no cyanosis no clubbing SKIN:  No rashes no nodules NEURO:  Cranial nerves II through XII grossly intact, motor grossly intact throughout PSYCH:  Cognitively intact, oriented to person place and time  ASSESSMENT/PLAN:    Nonobstructive CAD / Microvascular angina - chest pain not improved with antianginal therapy suggestive not cardiac in origin. GDMT aspirin  81mg  daily, atorvastatin  40mg  daily, labetolol 200mg  BID, amlodipine  10mg  daily, Imdur 15mg  daily. Due to headache, reduce Imdur from 30mg  to 15mg  as previously prescribed.  TAA - PET 02/27/24 ascending aorta 4.0 cm, plan for repeat imaging in 1 year. Can be coordinated at follow up. Continue optimal BP control including BB. Recommend avoidance of fluoroquinolones.   HTN - BP in clinic 145/86 ? 128/80 without intervention. BP goal <130/80.   Continue Amlodipine  10mg  daily, Spironolactone  25mg  daily, Labetolol to 200mg  BID. Reduce Imdur from 30mg  to 15mg  as above due to headache. Renal artery duplex 09/2023 with no stenosis.  As BP controlled, will defer further workup such as TSH, renin aldosterone, metanephrines If renin-aldosterone ordered in future will require to hold spironolactone  prior to testing Recommend aiming for 150 minutes of moderate intensity activity per week and following a heart healthy diet.    CKD - Dates back to 2013. Follows with Dr. Howell. Careful titration of antihypertensive.    Depression - Upcoming appointment to establish with counseling. Reviewed pursed lip breathing technique to help during times of anxiety.   OSA - CPAP compliance  encouraged.   Screening for Secondary Hypertension:     Relevant Labs/Studies:    Latest Ref Rng & Units 02/19/2024    4:44 AM 02/18/2024    5:53 AM 02/17/2024    8:17 PM  Basic Labs  Sodium 135 - 145 mmol/L 136   138   Potassium 3.5 - 5.1 mmol/L 4.1   3.5   Creatinine 0.44 - 1.00 mg/dL 8.43  8.53  8.22        Latest Ref Rng & Units 05/26/2022    1:59 PM 01/03/2022    4:02 PM  Thyroid    TSH 0.350 - 4.500 uIU/mL 1.610  0.96        Latest Ref Rng & Units 06/30/2022    9:55 AM  Renin/Aldosterone   Aldosterone 0.0 - 30.0 ng/dL 88.5           Latest Ref Rng & Units 05/26/2022    1:59 PM  Cortisol  Cortisol  ug/dL 9.1        5/88/7974   10:26 AM  Renovascular   Renal Artery US  Completed Yes      Disposition:    FU as schedule with Dr. Lavona and with [facility] in 6 months  Medication Adjustments/Labs and Tests Ordered: Current medicines are reviewed at length with the patient today.  Concerns regarding medicines are outlined above.  No orders of the defined types were placed in this encounter.  No orders of the defined types were placed in this encounter.    Signed, Reche GORMAN Finder, NP  04/07/2024 11:43 AM    [facility]

## 2024-04-07 ENCOUNTER — Encounter (HOSPITAL_BASED_OUTPATIENT_CLINIC_OR_DEPARTMENT_OTHER): Payer: Self-pay | Admitting: Family

## 2024-04-09 ENCOUNTER — Other Ambulatory Visit: Payer: Self-pay

## 2024-04-09 MED ORDER — GABAPENTIN 100 MG PO CAPS
100.0000 mg | ORAL_CAPSULE | Freq: Two times a day (BID) | ORAL | 0 refills | Status: AC
Start: 2024-04-09 — End: ?
  Filled 2024-04-09: qty 60, 30d supply, fill #0

## 2024-04-09 NOTE — Telephone Encounter (Signed)
 Called & spoke to the patient. Verified name & DOB. Informed of results. Informed to hold off on taking the iron supplement. Lab appointment scheduled for 04/30/24. Patient expressed verbal understanding of all discussed.

## 2024-04-10 ENCOUNTER — Other Ambulatory Visit: Payer: Self-pay

## 2024-04-11 ENCOUNTER — Other Ambulatory Visit: Payer: Self-pay

## 2024-04-14 ENCOUNTER — Other Ambulatory Visit: Payer: Self-pay

## 2024-04-15 ENCOUNTER — Ambulatory Visit: Admitting: Internal Medicine

## 2024-04-24 ENCOUNTER — Encounter: Payer: Self-pay | Admitting: Neurology

## 2024-04-28 ENCOUNTER — Other Ambulatory Visit: Payer: Self-pay

## 2024-04-29 ENCOUNTER — Other Ambulatory Visit: Payer: Self-pay

## 2024-04-30 ENCOUNTER — Ambulatory Visit

## 2024-05-05 ENCOUNTER — Other Ambulatory Visit: Payer: Self-pay

## 2024-05-06 ENCOUNTER — Ambulatory Visit

## 2024-05-06 ENCOUNTER — Other Ambulatory Visit: Payer: Self-pay

## 2024-05-07 DIAGNOSIS — I2510 Atherosclerotic heart disease of native coronary artery without angina pectoris: Secondary | ICD-10-CM | POA: Insufficient documentation

## 2024-05-07 NOTE — Progress Notes (Deleted)
 Cardiology Office Note:   Date:  05/07/2024  ID:  Janice [name], DOB 1966/03/16, MRN [id number] PCP: Vicci Barnie NOVAK, MD  [facility] Providers Cardiologist:  Lynwood Schilling, MD {  History of Present Illness:   Tarica Harl Sattler is a 58 y.o. female with hypertension who I saw previously for evaluation of chest pain with no evidence of ischemia.  ***     ***a hx of HTN, CKDIII, vitamin D  deficiency, morbid obesity  here to establish care in the [facility].    Established with [facility] 08/16/23 after referral by her PCP.  There was concern about a/ARB due to hyperkalemia though on my review isolated elevated potassium 05/01/2022 of 5.3 with labs 10 days later K4.1 raising question of hemolysis of initial sample causing phosphate elevated potassium.  Since that time potassium 3.9-4.1.  Prior to that 2016-2022 persistent hypokalemia..  Initial diagnosis hypertension at 58 year old and started antihypertensive regimen after pregnancy.  Family history of hypertension in her daughter, granddaughter.  She was using arm cuff at home with BP 120-130s over 90s.  She previously quit smoking tobacco 06/2023.  Alcohol use only socially sparingly.  She was walking working in yard for exercise and following a low-sodium diet.  Her labetalol  was increased to 150 mg twice daily for diastolic BP lowering.  Renal artery duplex 09/14/2023 with no evidence of renal artery stenosis.   Prior admission in March to [facility].    Admission 02/2024 with SOB, productive cough, flank pain, chest pain. CTA ruled out PE but did note multivessel coronary calcification. Echo LVEF 60-65%, gr1dd, normal RV. Outpatient PET with no ischemia, mild decrease in myocardial blood flow reserve which can be seen in microvascular disease. Overread with 4cm ascending TAA, mildly prominent PA, aortic atherosclerosis, atx/scarring LLB, spondylosis.    03/19/24 seen by Dayna Dunn, PA  with Labetolol increased to 200mg  BID and Imdur  15mg  added for antianginal benefit.    Presents today for follow up. Reports her chest pain is about the same. It is occurring daily at rest. For example, had some pain that did wake her up from sleep. Reviewed reassuring cardiac tests. Also notes sensation of air on her chest and wonders if her GERD is contributory to chest pain. Recently established with pain management clinic, declined injections, has been referred to a new clinic for non-invasive measures. She is eating at home, following low salt diet. She has been stretching for exercise. Reports BP at home has been good, BP yesterday at pain clinic 124/78. Reports her headaches are about the same as previous though has been taking whole tablet of Imdur  instead of prescribed half tablet.     ROS: ***  Studies Reviewed:    EKG:       ***  Risk Assessment/Calculations:   {Does this patient have ATRIAL FIBRILLATION?:[phone]} No BP recorded.  {Refresh Note OR Click here to enter BP  :1}***        Physical Exam:   VS:  LMP 07/20/2016    Wt Readings from Last 3 Encounters:  04/03/24 223 lb 12.8 oz (101.5 kg)  03/19/24 226 lb (102.5 kg)  02/22/24 219 lb (99.3 kg)     GEN: Well nourished, well developed in no acute distress NECK: No JVD; No carotid bruits CARDIAC: ***RR, *** murmurs, rubs, gallops RESPIRATORY:  Clear to auscultation without rales, wheezing or rhonchi  ABDOMEN: Soft, non-tender, non-distended EXTREMITIES:  No edema; No deformity   ASSESSMENT AND PLAN:  Nonobstructive CAD / Microvascular angina :  ***  - chest pain not improved with antianginal therapy suggestive not cardiac in origin. GDMT aspirin  81mg  daily, atorvastatin  40mg  daily, labetolol 200mg  BID, amlodipine  10mg  daily, Imdur  15mg  daily. Due to headache, reduce Imdur  from 30mg  to 15mg  as previously prescribed.   TAA - Ascending aorta 4.0 cm.  ***   plan for repeat imaging in 1 year. Can be coordinated at  follow up. Continue optimal BP control including BB. Recommend avoidance of fluoroquinolones.    HTN :  ***  - BP in clinic 145/86 ? 128/80 without intervention. BP goal <130/80.   Continue Amlodipine  10mg  daily, Spironolactone  25mg  daily, Labetolol to 200mg  BID. Reduce Imdur  from 30mg  to 15mg  as above due to headache. Renal artery duplex 09/2023 with no stenosis.  As BP controlled, will defer further workup such as TSH, renin aldosterone, metanephrines If renin-aldosterone ordered in future will require to hold spironolactone  prior to testing Recommend aiming for 150 minutes of moderate intensity activity per week and following a heart healthy diet.     CKD :  *** - Dates back to 2013. Follows with Dr. Howell. Careful titration of antihypertensive.     Depression :  *** - Upcoming appointment to establish with counseling. Reviewed pursed lip breathing technique to help during times of anxiety.    OSA:  ***   - CPAP compliance encouraged.      Follow up ***  Signed, Lynwood Schilling, MD

## 2024-05-07 NOTE — Progress Notes (Deleted)
 HPI F former smoker followed for OSA on CPAP , polycythemia,  Medical problem list includes Migraine, HTN, GERD, Psoriasis, Osteoarthritis, CKD3b, Depression, Polycythemia secondary, Leukocytosis, Obesity, NPSG 07/04/22- AHI 63.4/hr, desaturation to 66%/mean 89.7%, body weight 232 lbs PFT- 10/23/23- moderate obstruction, insignif to BD, Lung volume wnl, DLCO-wnl ========================================================================================================  03/20/23- 57 yoF former smoker for sleep evaluation courtesy of Dr Barnie Louder with concern of OSA on CPAP for [employer]. Medical problem list includes Migraine, HTN, GERD, Psoriasis, Osteoarthritis, CKD3b, Depression, Polycythemia secondary, Leukocytosis, Obesity,  NPSG 07/04/22- AHI 63.4/hr, desaturation to 66%/mean 89.7%, body weight 232 lbs CPAP titration to19 on 4/22 CPAP/ [employer] Epworth score- 17 Body weight today-222 lbs Sleep study was done because of history of loud snoring and polycythemia raising concern of nocturnal hypoxemia.SABRA  CPAP successfully started this year, does reduce her snoring.  She is having some mask discomfort and needs help with refit.  She understands weight loss would help her.  She is satisfied to continue working with CPAP.  No history of ENT surgery.  Not aware of lung disease.  Stopped smoking in April.  Once we have her settled with CPAP use we will recheck overnight oximetry and she should probably have PFT because of her smoking history. We will contact [employer] to establish download capability and set at auto 5-20.                      //will need PFT, ONOX on CPAP next visit//    10/23/23-57 yoF former smoker followed for OSA on CPAP, COPD, complicated by  polycythemia,  Medical problem list includes Migraine, HTN, GERD, Psoriasis, Osteoarthritis, CKD3b, Depression, Polycythemia secondary, Leukocytosis, Obesity,  CPAP [employer] auto 5-20 Download compliance-50%, AHI 3.3/hr Body weight today- 222  lbs Hosp in March Behavioral [facility]-Depression Overnight oximetry never done Last Hgb WNL 14 in March. PFT- 10/23/23- moderate obstruction, insignif to BD, Lung volume wnl, DLCO-wnl Download reviewed.  Discussed compliance goals and CPAP cleaning.  Needs refill rescue inhaler. Not smoking. Some DOE, not much and no worse.  05/08/24- 11 yoF former smoker followed for OSA on CPAP, COPD, complicated by  polycythemia,  Medical problem list includes Migraine, HTN, GERD, Psoriasis, Osteoarthritis, CKD3b, Depression, Polycythemia secondary, Leukocytosis, Obesity,  CPAP [employer] auto 5-20 Download compliance- Body weight today- Hosp this summer for chest pain/ depression.    ROS-see HPI   + = positive Constitutional:    weight loss, night sweats, fevers, chills, fatigue, lassitude. HEENT:    headaches, difficulty swallowing, +tooth/dental problems, sore throat,       sneezing, itching, +ear ache, nasal congestion, post nasal drip, snoring CV:    chest pain, orthopnea, PND, +swelling in lower extremities, anasarca, dizziness, palpitations Resp:   +shortness of breath with exertion or at rest.                productive cough,   non-productive cough, coughing up of blood.              change in color of mucus.  wheezing.   Skin:    rash or lesions. GI:   +heartburn, indigestion, abdominal pain, nausea, vomiting, diarrhea,                 change in bowel habits, loss of appetite GU: dysuria, change in color of urine, no urgency or frequency.   flank pain. MS:   +joint pain, stiffness, decreased range of motion, back pain. Neuro-     nothing unusual Psych:  change  in mood or affect.  depression or +anxiety.   memory loss.  OBJ- Physical Exam General- Alert, Oriented, Affect-appropriate, Distress- none acute, +morbid obesity Skin- rash-none, lesions- none, excoriation- none Lymphadenopathy- none Head- atraumatic            Eyes- Gross vision intact, PERRLA, conjunctivae and secretions clear             Ears- Hearing, canals-normal            Nose- Clear, no-Septal dev, mucus, polyps, erosion, perforation             Throat- Mallampati IV , mucosa clear , drainage- none, tonsils- atrophic, +teeth Neck- flexible , trachea midline, no stridor , thyroid  nl, carotid no bruit Chest - symmetrical excursion , unlabored           Heart/CV- RRR , no murmur , no gallop  , no rub, nl s1 s2                           - JVD- none , edema- none, stasis changes- none, varices- none           Lung- clear to P&A, wheeze- none, cough- none , dullness-none, rub- none           Chest wall-  Abd-  Br/ Gen/ Rectal- Not done, not indicated Extrem- cyanosis- none, clubbing, none, atrophy- none, strength- nl Neuro- grossly intact to observation

## 2024-05-08 ENCOUNTER — Ambulatory Visit: Admitting: Internal Medicine

## 2024-05-08 ENCOUNTER — Ambulatory Visit: Attending: Cardiology | Admitting: Cardiology

## 2024-05-08 DIAGNOSIS — N1832 Chronic kidney disease, stage 3b: Secondary | ICD-10-CM

## 2024-05-08 DIAGNOSIS — I2510 Atherosclerotic heart disease of native coronary artery without angina pectoris: Secondary | ICD-10-CM

## 2024-05-08 DIAGNOSIS — G4733 Obstructive sleep apnea (adult) (pediatric): Secondary | ICD-10-CM

## 2024-05-08 DIAGNOSIS — I10 Essential (primary) hypertension: Secondary | ICD-10-CM

## 2024-05-22 ENCOUNTER — Other Ambulatory Visit: Payer: Self-pay

## 2024-05-22 MED ORDER — SERTRALINE HCL 100 MG PO TABS
100.0000 mg | ORAL_TABLET | Freq: Every day | ORAL | 0 refills | Status: AC
  Filled 2024-05-22: qty 90, 90d supply, fill #0

## 2024-05-22 MED ORDER — BUPROPION HCL ER (XL) 300 MG PO TB24
300.0000 mg | ORAL_TABLET | Freq: Every day | ORAL | 0 refills | Status: AC
  Filled 2024-05-22: qty 90, 90d supply, fill #0

## 2024-05-22 MED ORDER — TRAZODONE HCL 100 MG PO TABS
100.0000 mg | ORAL_TABLET | Freq: Every day | ORAL | 0 refills | Status: AC
  Filled 2024-05-22: qty 90, 90d supply, fill #0

## 2024-06-02 ENCOUNTER — Other Ambulatory Visit: Payer: Self-pay

## 2024-06-10 ENCOUNTER — Ambulatory Visit: Payer: Self-pay | Attending: Internal Medicine | Admitting: Internal Medicine

## 2024-06-10 ENCOUNTER — Ambulatory Visit

## 2024-06-10 ENCOUNTER — Other Ambulatory Visit: Payer: Self-pay

## 2024-06-10 VITALS — BP 138/86 | HR 73 | Temp 98.4°F | Ht 60.0 in | Wt 222.0 lb

## 2024-06-10 DIAGNOSIS — I2510 Atherosclerotic heart disease of native coronary artery without angina pectoris: Secondary | ICD-10-CM

## 2024-06-10 DIAGNOSIS — F411 Generalized anxiety disorder: Secondary | ICD-10-CM

## 2024-06-10 DIAGNOSIS — D509 Iron deficiency anemia, unspecified: Secondary | ICD-10-CM

## 2024-06-10 DIAGNOSIS — G4733 Obstructive sleep apnea (adult) (pediatric): Secondary | ICD-10-CM

## 2024-06-10 DIAGNOSIS — K219 Gastro-esophageal reflux disease without esophagitis: Secondary | ICD-10-CM | POA: Diagnosis not present

## 2024-06-10 DIAGNOSIS — Z79899 Other long term (current) drug therapy: Secondary | ICD-10-CM | POA: Diagnosis not present

## 2024-06-10 DIAGNOSIS — Z1231 Encounter for screening mammogram for malignant neoplasm of breast: Secondary | ICD-10-CM

## 2024-06-10 DIAGNOSIS — N1832 Chronic kidney disease, stage 3b: Secondary | ICD-10-CM | POA: Diagnosis not present

## 2024-06-10 DIAGNOSIS — F322 Major depressive disorder, single episode, severe without psychotic features: Secondary | ICD-10-CM | POA: Diagnosis not present

## 2024-06-10 DIAGNOSIS — Z860100 Personal history of colon polyps, unspecified: Secondary | ICD-10-CM | POA: Diagnosis not present

## 2024-06-10 DIAGNOSIS — I10 Essential (primary) hypertension: Secondary | ICD-10-CM | POA: Diagnosis not present

## 2024-06-10 MED ORDER — PANTOPRAZOLE SODIUM 40 MG PO TBEC
40.0000 mg | DELAYED_RELEASE_TABLET | Freq: Every day | ORAL | 0 refills | Status: AC
  Filled 2024-06-10: qty 90, 90d supply, fill #0

## 2024-06-10 MED ORDER — AMLODIPINE BESYLATE 10 MG PO TABS
10.0000 mg | ORAL_TABLET | Freq: Every day | ORAL | 1 refills | Status: AC
  Filled 2024-06-10: qty 90, 90d supply, fill #0

## 2024-06-10 MED ORDER — ATORVASTATIN CALCIUM 40 MG PO TABS
40.0000 mg | ORAL_TABLET | Freq: Every day | ORAL | 0 refills | Status: AC
  Filled 2024-06-10: qty 90, 90d supply, fill #0

## 2024-06-10 NOTE — Progress Notes (Unsigned)
 "   Patient ID: Janice [name], female    DOB: Oct 13, 1965  MRN: [id number]  CC: Hypertension (HTN f/u. Thompson if tramadol  is still on med list/No to flu vax/Yes to mammogram. Colonoscopy done in 2025.)   Subjective: Janice [name] is a 59 y.o. female who presents for chronic ds management. Her chronic medical issues include:  Pt with hx of HTN,  CKD 3b, former tob dep, HL, Vit D def, preDM/morbid obesity, MDD, +HPV on pap, GERD, chronic hypokalemia, psoriasis right leg, OA knee (Dr. Jerri), migraines (Dr. Skeet), OSA on CPAP (06/2022), ascending aortic aneurysm needs repeat imaging 02/2025    Discussed the use of AI scribe software for clinical note transcription with the patient, who gave verbal consent to proceed.  History of Present Illness Janice [name] is a 59 year old female with hypertension, chronic kidney disease, and depression who presents for follow-up of her chronic disease management.  HTN: Her blood pressure today was 138/86 mmHg. She has not taken her blood pressure medication today and has not been as consistent as she should in taking them recently due to feeling depressed and forgetting to take them. Reports she also forgot what each med is for. Has meds with her today for reconciliation. She mentions that if she does not eat, taking the medication makes her feel sick. Her blood pressure medications include labetalol , which was increased from 150 mg to 200 mg twice a day (by cardiology NP), amlodipine . She also takes spironolactone  and isosorbide , which was added by her cardiologist in October.   She is experiencing significant depression, particularly during the holiday season, due to the loss of her mother and sister around Thanksgiving and Christmas. She is stressed by her daughter's recent incarceration, which has left her responsible for her grandchildren. She has an upcoming appointment with her therapist, which will be her second visit. Her psychiatrist recently increased  her trazodone  from 50 mg to 100 mg and bupropion  from 150 mg to 300 mg.  Zoloft  100 mg was continued.  She has a bottle of bupropion  150 mg and also a new bottle for the 300 mg.  She also still has a bottle of trazodone  50 mg and the new bottle for the 100 mg.  She has missed several important appointments due to her phone being disconnected.   She reports a history of memory issues. There is a family history of dementia in her maternal grandmother. Had neuropsychiatric eval about a yr ago that concluded that her memory issues likely due to depression.  She experiences acid reflux and takes pantoprazole , which helps her symptoms. She also takes vitamin D  for deficiency, once a week.  OSA: She has not been using her CPAP machine recently and needs to resume its use.   Sees neurology for migraines.  Prescribe Ajovy .  She has not started her headache medication injections as she forgot to bring them and is unsure how to use them.   CKD 3b: followed by [facility] nephrology. Last seen 02/2024. GFR remains in the 30s. Mild anemia on last CBC. Will recheck today.  Needs RF on Atorvastatin .  HM: due for 1 yr repeat c-scope due to numerous polyps. Due for MMG    Patient Active Problem List   Diagnosis Date Noted   CAD in native artery 05/07/2024   Adjustment disorder with mixed anxiety and depressed mood 02/20/2024   Suicidal ideation 02/20/2024   Precordial chest pain 02/20/2024   Chest pain 02/18/2024   Genetic testing 12/10/2023  COPD mixed type (HCC) 11/14/2023   MDD (major depressive disorder) 08/23/2023   Low grade squamous intraepithelial lesion (LGSIL) on biopsy of cervix 06/23/2023   Generalized anxiety disorder    OSA (obstructive sleep apnea) 06/01/2022   Polycythemia, secondary 05/26/2022   Leukocytosis 05/26/2022   Stage 3b chronic kidney disease 05/03/2022   Primary osteoarthritis of right knee 12/13/2021   Morbid obesity with body mass index (BMI) of 40.0 to 44.9 in adult  12/13/2021   Migraine with aura and without status migrainosus, not intractable 05/31/2021   Major depressive disorder 05/31/2021   Prediabetes 04/04/2019   Perimenopausal 03/06/2019   Psoriasis 03/06/2019   Vitamin D  deficiency 09/29/2015   Essential hypertension, benign 08/02/2006   Esophageal reflux 08/02/2006     Medications Ordered Prior to Encounter[1]  Allergies[2]  Social History   Socioeconomic History   Marital status: Single    Spouse name: Not on file   Number of children: 3   Years of education: 10   Highest education level: 10th grade  Occupational History   Occupation: Unemployed  Tobacco Use   Smoking status: Former    Types: Cigarettes    Passive exposure: Past   Smokeless tobacco: Never   Tobacco comments:    Pt states she quit smoking 9 months ago. AB, CMA 06-25-2023  Vaping Use   Vaping status: Never Used  Substance and Sexual Activity   Alcohol use: Not Currently    Comment: occ   Drug use: No   Sexual activity: Yes    Partners: Male    Birth control/protection: Condom    Comment: single  Other Topics Concern   Not on file  Social History Narrative   Right Handed    Social Drivers of Health   Tobacco Use: Medium Risk (04/07/2024)   Patient History    Smoking Tobacco Use: Former    Smokeless Tobacco Use: Never    Passive Exposure: Past  Physicist, Medical Strain: Low Risk (09/29/2022)   Overall Financial Resource Strain (CARDIA)    Difficulty of Paying Living Expenses: Not very hard  Food Insecurity: No Food Insecurity (02/18/2024)   Epic    Worried About Programme Researcher, Broadcasting/film/video in the Last Year: Never true    Ran Out of Food in the Last Year: Never true  Transportation Needs: No Transportation Needs (02/18/2024)   Epic    Lack of Transportation (Medical): No    Lack of Transportation (Non-Medical): No  Physical Activity: Insufficiently Active (09/29/2022)   Exercise Vital Sign    Days of Exercise per Week: 2 days    Minutes of Exercise  per Session: 40 min  Stress: No Stress Concern Present (09/29/2022)   [employer] of Occupational Health - Occupational Stress Questionnaire    Feeling of Stress : Only a little  Social Connections: Unknown (09/29/2022)   Social Connection and Isolation Panel    Frequency of Communication with Friends and Family: More than three times a week    Frequency of Social Gatherings with Friends and Family: More than three times a week    Attends Religious Services: 1 to 4 times per year    Active Member of [employer] or Organizations: No    Attends Banker Meetings: Never    Marital Status: Patient declined  Intimate Partner Violence: Not At Risk (02/18/2024)   Epic    Fear of Current or Ex-Partner: No    Emotionally Abused: No    Physically Abused: No    Sexually  Abused: No  Depression (PHQ2-9): High Risk (02/22/2024)   Depression (PHQ2-9)    PHQ-2 Score: 26  Alcohol Screen: Low Risk (08/23/2023)   Alcohol Screen    Last Alcohol Screening Score (AUDIT): 4  Housing: Low Risk (02/18/2024)   Epic    Unable to Pay for Housing in the Last Year: No    Number of Times Moved in the Last Year: 1    Homeless in the Last Year: No  Utilities: Not At Risk (02/18/2024)   Epic    Threatened with loss of utilities: No  Health Literacy: Not on file    Family History  Problem Relation Age of Onset   Hypertension Mother    Kidney disease Mother    Pancreatic cancer Father 66 - 40   Alcohol abuse Father    Depression Father    Drug abuse Father    Hypertension Father    Cancer Paternal Aunt        unknown type   Hypertension Maternal Grandmother    Stroke Maternal Grandmother    Dementia Maternal Grandmother    Alzheimer's disease Maternal Grandfather    Hypertension Daughter    Colon cancer Neg Hx    Esophageal cancer Neg Hx    Stomach cancer Neg Hx    Rectal cancer Neg Hx     Past Surgical History:  Procedure Laterality Date   CESAREAN SECTION     x2    ROS: Review of  Systems Negative except as stated above  PHYSICAL EXAM: BP 138/86 (BP Location: Left Arm, Patient Position: Sitting, Cuff Size: Large)   Pulse 73   Temp 98.4 F (36.9 C) (Oral)   Ht 5' (1.524 m)   Wt 222 lb (100.7 kg)   LMP 07/20/2016   SpO2 97%   BMI 43.36 kg/m   Physical Exam  General appearance - alert, well appearing, and in no distress Mental status - pt tearful sometimes when discussing stresses Chest - clear to auscultation, no wheezes, rales or rhonchi, symmetric air entry Heart - normal rate, regular rhythm, normal S1, S2, no murmurs, rubs, clicks or gallops Extremities - peripheral pulses normal, no pedal edema, no clubbing or cyanosis     06/10/2024    4:51 PM 02/22/2024    4:43 PM 10/22/2023    3:56 PM  Depression screen PHQ 2/9  Decreased Interest 3 3 2   Down, Depressed, Hopeless 3 3 3   PHQ - 2 Score 6 6 5   Altered sleeping 3 3 3   Tired, decreased energy 3 3 3   Change in appetite 3 3 3   Feeling bad or failure about yourself  3 3 3   Trouble concentrating 3 3 3   Moving slowly or fidgety/restless 2 3 2   Suicidal thoughts 1 2 3   PHQ-9 Score 24 26  25    Difficult doing work/chores Extremely dIfficult Extremely dIfficult Very difficult     Data saved with a previous flowsheet row definition      06/10/2024    4:52 PM 02/22/2024    4:44 PM 10/22/2023    3:57 PM 02/16/2023    3:00 PM  [name] 7 : Generalized Anxiety Score  Nervous, Anxious, on Edge 2 3 2 2   Control/stop worrying 2 3 3 3   Worry too much - different things 3 3 3 3   Trouble relaxing 3 3 3 2   Restless 2 3 2 2   Easily annoyed or irritable 2 3 2 2   Afraid - awful might happen 3 3 3  2  Total [name] 7 Score 17 21 18 16   Anxiety Difficulty Very difficult Extremely difficult Very difficult Somewhat difficult        Latest Ref Rng & Units 02/19/2024    4:44 AM 02/18/2024    5:53 AM 02/17/2024    8:17 PM  CMP  Glucose 70 - 99 mg/dL 879   898   BUN 6 - 20 mg/dL 30   22   Creatinine 9.55 - 1.00 mg/dL 8.43   8.53  8.22   Sodium 135 - 145 mmol/L 136   138   Potassium 3.5 - 5.1 mmol/L 4.1   3.5   Chloride 98 - 111 mmol/L 97   101   CO2 22 - 32 mmol/L 20   26   Calcium  8.9 - 10.3 mg/dL 9.6   9.5   Total Protein 6.5 - 8.1 g/dL 7.5   7.8   Total Bilirubin 0.0 - 1.2 mg/dL 0.3   0.7   Alkaline Phos 38 - 126 U/L 103   113   AST 15 - 41 U/L 20   14   ALT 0 - 44 U/L 7   <5    Lipid Panel     Component Value Date/Time   CHOL 184 02/19/2024 0444   CHOL 181 05/01/2022 1559   TRIG 148 02/19/2024 0444   HDL 34 (L) 02/19/2024 0444   HDL 50 05/01/2022 1559   CHOLHDL 5.4 02/19/2024 0444   VLDL 30 02/19/2024 0444   LDLCALC 120 (H) 02/19/2024 0444   LDLCALC 106 (H) 05/01/2022 1559    CBC    Component Value Date/Time   WBC 11.0 (H) 03/19/2024 1143   WBC 15.7 (H) 02/19/2024 0444   RBC 4.81 03/19/2024 1143   RBC 4.90 02/19/2024 0444   HGB 11.4 03/19/2024 1143   HCT 38.5 03/19/2024 1143   PLT 309 03/19/2024 1143   MCV 80 03/19/2024 1143   MCH 23.7 (L) 03/19/2024 1143   MCH 23.1 (L) 02/19/2024 0444   MCHC 29.6 (L) 03/19/2024 1143   MCHC 29.0 (L) 02/19/2024 0444   RDW 13.4 03/19/2024 1143   LYMPHSABS 3.4 08/21/2023 2222   LYMPHSABS 2.2 03/06/2019 1344   MONOABS 0.8 08/21/2023 2222   EOSABS 0.1 08/21/2023 2222   EOSABS 0.2 03/06/2019 1344   BASOSABS 0.1 08/21/2023 2222   BASOSABS 0.1 03/06/2019 1344    ASSESSMENT AND PLAN: 1. Major depressive disorder, severe (HCC) (Primary) 2. [name] (generalized anxiety disorder) PHQ9 and [name]-7 done today and scores shown above. Patient with significant ongoing depression and anxiety.  She is plugged in with behavioral health services with a psychiatrist and therapist.  I went over her psychiatric medications with her.  She still has some trazodone  50 mg pills left.  I marked on the bottle and explained to her that she can take 2 of them at bedtime until she runs out then stop the 100 mg pills.  For the bupropion , she still has quite a number of the 150 mg  pills left.  I told her that her psychiatrist wants her to be on 300 mg.  She can take 2 of the 150 mg pills daily until she runs out then start the 300 mg tablets.  I put a rubber band around the medications to help her remember. -Continue f/u with [facility]. Be seen in ER if becomes suicidal with plans.  3. Essential hypertension Not at goal.  She has not been taking her medicines consistently due to depression.  She plans to  get back on track.  She will continue the medications listed above including labetalol , Norvasc , spironolactone  and isosorbide . - amLODipine  (NORVASC ) 10 MG tablet; Take 1 tablet (10 mg total) by mouth once nightly at bedtime.  Dispense: 90 tablet; Refill: 1 - Comprehensive metabolic panel with GFR  4. Microcytic anemia Recheck CBC and iron studies today - CBC - Iron, TIBC and Ferritin Panel  5. CKD stage 3b, GFR 30-44 ml/min (HCC) Stable.  Continue follow-up with nephrology with [facility]  Not on NSAIDs. - Comprehensive metabolic panel with GFR  6. Coronary artery calcification Continue atorvastatin . - atorvastatin  (LIPITOR) 40 MG tablet; Take 1 tablet (40 mg total) by mouth daily.  Dispense: 90 tablet; Refill: 0  7. OSA on CPAP Patient plans to get back on track on using her CPAP every night.  8. Gastroesophageal reflux disease without esophagitis Stable on pantoprazole . - pantoprazole  (PROTONIX ) 40 MG tablet; Take 1 tablet (40 mg total) by mouth daily.  Dispense: 90 tablet; Refill: 0  9. Encounter for medication review I spent some time doing medication reconciliation with this patient and writing out what each medicine is for.  10. History of colon polyps - Ambulatory referral to Gastroenterology  11. Encounter for screening mammogram for malignant neoplasm of breast Mammogram ordered.     Patient was given the opportunity to ask questions.  Patient verbalized understanding of the plan and was able to repeat key elements of the plan.   This  documentation was completed using Paediatric nurse.  Any transcriptional errors are unintentional.  No orders of the defined types were placed in this encounter.    Requested Prescriptions   Pending Prescriptions Disp Refills   atorvastatin  (LIPITOR) 40 MG tablet 90 tablet 0    Sig: Take 1 tablet (40 mg total) by mouth daily.   pantoprazole  (PROTONIX ) 40 MG tablet 90 tablet 0    Sig: Take 1 tablet (40 mg total) by mouth daily.   amLODipine  (NORVASC ) 10 MG tablet 90 tablet 1    Sig: Take 1 tablet (10 mg total) by mouth once nightly at bedtime.   gabapentin  (NEURONTIN ) 100 MG capsule 60 capsule 0    Sig: Take 1 capsule (100 mg total) by mouth 2 (two) times daily.    No follow-ups on file.  Barnie Louder, MD, FACP     [1]  Current Outpatient Medications on File Prior to Visit  Medication Sig Dispense Refill   albuterol  (VENTOLIN  HFA) 108 (90 Base) MCG/ACT inhaler Inhale 2 puffs into the lungs every 6 (six) hours as needed for wheezing or shortness of breath. 18 g 6   amLODipine  (NORVASC ) 10 MG tablet Take 1 tablet (10 mg total) by mouth once nightly at bedtime. 90 tablet 1   aspirin  EC 81 MG tablet Take 1 tablet (81 mg total) by mouth daily. Swallow whole. 30 tablet 12   atorvastatin  (LIPITOR) 40 MG tablet Take 1 tablet (40 mg total) by mouth daily. 90 tablet 0   buPROPion  (WELLBUTRIN  XL) 300 MG 24 hr tablet Take 1 tablet (300 mg total) by mouth daily. 90 tablet 0   Fremanezumab -vfrm (AJOVY ) 225 MG/1.5ML SOAJ Inject 225 mg into the skin every 28 (twenty-eight) days. 1.5 mL 5   gabapentin  (NEURONTIN ) 100 MG capsule Take 1 capsule (100 mg total) by mouth 2 (two) times daily. 60 capsule 0   isosorbide  mononitrate (IMDUR ) 30 MG 24 hr tablet Take 0.5 tablets (15 mg total) by mouth daily. 45 tablet 3  labetalol  (NORMODYNE ) 200 MG tablet Take 1 tablet (200 mg total) by mouth 2 (two) times daily. 180 tablet 3   pantoprazole  (PROTONIX ) 40 MG tablet Take 1 tablet (40 mg  total) by mouth daily. 90 tablet 0   polyethylene glycol powder (GLYCOLAX /MIRALAX ) 17 GM/SCOOP powder Mix 1 capful (17 g) in 4-8 ounces in water/juice by mouth daily as needed (for constipation). 3350 g 1   sertraline  (ZOLOFT ) 100 MG tablet Take 1 tablet (100 mg total) by mouth daily. 90 tablet 0   traZODone  (DESYREL ) 100 MG tablet Take 1 tablet (100 mg total) by mouth at bedtime. 90 tablet 0   traZODone  (DESYREL ) 50 MG tablet Take 1 tablet (50 mg total) by mouth at bedtime. 90 tablet 0   Ubrogepant  (UBRELVY ) 100 MG TABS Take 100 mg by mouth daily as needed.     Vitamin D , Ergocalciferol , (DRISDOL ) 1.25 MG (50000 UNIT) CAPS capsule Take 1 capsule (50,000 Units total) by mouth once a week. 13 capsule 4   buPROPion  (WELLBUTRIN  XL) 150 MG 24 hr tablet Take 1 tablet (150 mg total) by mouth daily. (Patient not taking: Reported on 06/10/2024) 90 tablet 0   calcium  carbonate (TUMS - DOSED IN MG ELEMENTAL CALCIUM ) 500 MG chewable tablet Chew 1 tablet (200 mg of elemental calcium  total) by mouth 3 (three) times daily with meals. (Patient not taking: Reported on 06/10/2024) 90 tablet 0   docusate sodium  (COLACE) 100 MG capsule Take 1 capsule (100 mg total) by mouth 2 (two) times daily. (Patient not taking: Reported on 06/10/2024) 60 capsule 0   famotidine  (PEPCID ) 20 MG tablet Take 20 mg by mouth daily. (Patient not taking: Reported on 06/10/2024)     sertraline  (ZOLOFT ) 100 MG tablet Take 1 tablet (100 mg total) by mouth daily. (Patient not taking: Reported on 06/10/2024) 90 tablet 0   spironolactone  (ALDACTONE ) 25 MG tablet Take 1 tablet (25 mg total) by mouth daily. (Patient not taking: Reported on 06/10/2024) 90 tablet 1   No current facility-administered medications on file prior to visit.  [2]  Allergies Allergen Reactions   Losartan  Other (See Comments)    Chest pain   Neomycin Sulfate Swelling, Rash and Other (See Comments)    Pain in ear, swelling and redness   "

## 2024-06-10 NOTE — Patient Instructions (Signed)
" °  VISIT SUMMARY: Today, you came in for a follow-up on your chronic conditions, including hypertension, chronic kidney disease, and depression. We discussed your current medications, recent changes, and the impact of stress and depression on your health management.  YOUR PLAN: -ESSENTIAL HYPERTENSION: Hypertension is high blood pressure, which can lead to serious health problems if not managed. Your blood pressure was 138/86 mmHg today. Continue taking labetalol  200 mg twice daily, isosorbide  mononitrate 30 mg (half a tablet daily), and amlodipine  10 mg daily. Remember to use your pill organizer to help with medication adherence.  -MAJOR DEPRESSIVE DISORDER: Depression is a mood disorder that causes persistent feelings of sadness and loss of interest. Your psychiatrist increased your bupropion  to 300 mg daily and trazodone  to 100 mg at bedtime. Please attend your therapy appointment on January 8th, 2025.  -CHRONIC KIDNEY DISEASE: Chronic kidney disease is a long-term condition where the kidneys do not work as well as they should. Please contact your nephrologist at [facility] for a follow-up appointment. We also rechecked your kidney function with lab tests.  -DYSLIPIDEMIA: Dyslipidemia is having unhealthy levels of one or more kinds of lipid (fat) in your blood. Continue taking atorvastatin  as prescribed. We rechecked your cholesterol levels with lab tests.  -GASTROESOPHAGEAL REFLUX DISEASE: Gastroesophageal reflux disease (GERD) is a condition where stomach acid frequently flows back into the tube connecting your mouth and stomach. Continue taking pantoprazole  as prescribed.  -OBSTRUCTIVE SLEEP APNEA: Obstructive sleep apnea is a condition where your breathing stops and starts during sleep. Please resume using your CPAP machine.  -VITAMIN D  DEFICIENCY: Vitamin D  deficiency means you do not have enough vitamin D  in your body. Continue your weekly vitamin D  supplementation.  -GENERAL  HEALTH MAINTENANCE: You are due for a mammogram, colonoscopy, and Pap smear. We have ordered a mammogram, submitted a request for a repeat colonoscopy with your gastroenterologist, and scheduled a Pap smear.  INSTRUCTIONS: Please attend your therapy appointment on January 8th, 2025. Contact your nephrologist at [facility] for a follow-up appointment. Resume using your CPAP machine. We have ordered a mammogram, submitted a request for a repeat colonoscopy with your gastroenterologist, and scheduled a Pap smear.                      Contains text generated by [employer]bridge.                                 Contains text generated by [employer]bridge.   "

## 2024-06-11 ENCOUNTER — Encounter: Payer: Self-pay | Admitting: Internal Medicine

## 2024-06-11 ENCOUNTER — Other Ambulatory Visit: Payer: Self-pay

## 2024-06-11 ENCOUNTER — Ambulatory Visit: Payer: Self-pay | Admitting: Internal Medicine

## 2024-06-11 DIAGNOSIS — D72829 Elevated white blood cell count, unspecified: Secondary | ICD-10-CM

## 2024-06-11 LAB — COMPREHENSIVE METABOLIC PANEL WITH GFR
ALT: 10 IU/L (ref 0–32)
AST: 14 IU/L (ref 0–40)
Albumin: 4.2 g/dL (ref 3.8–4.9)
Alkaline Phosphatase: 142 IU/L — ABNORMAL HIGH (ref 49–135)
BUN/Creatinine Ratio: 12 (ref 9–23)
BUN: 20 mg/dL (ref 6–24)
Bilirubin Total: 0.5 mg/dL (ref 0.0–1.2)
CO2: 22 mmol/L (ref 20–29)
Calcium: 9.6 mg/dL (ref 8.7–10.2)
Chloride: 99 mmol/L (ref 96–106)
Creatinine, Ser: 1.61 mg/dL — ABNORMAL HIGH (ref 0.57–1.00)
Globulin, Total: 3.7 g/dL (ref 1.5–4.5)
Glucose: 131 mg/dL — ABNORMAL HIGH (ref 70–99)
Potassium: 3.6 mmol/L (ref 3.5–5.2)
Sodium: 141 mmol/L (ref 134–144)
Total Protein: 7.9 g/dL (ref 6.0–8.5)
eGFR: 37 mL/min/1.73 — ABNORMAL LOW

## 2024-06-11 LAB — CBC
Hematocrit: 45 % (ref 34.0–46.6)
Hemoglobin: 13.1 g/dL (ref 11.1–15.9)
MCH: 23.1 pg — ABNORMAL LOW (ref 26.6–33.0)
MCHC: 29.1 g/dL — ABNORMAL LOW (ref 31.5–35.7)
MCV: 80 fL (ref 79–97)
Platelets: 354 x10E3/uL (ref 150–450)
RBC: 5.66 x10E6/uL — ABNORMAL HIGH (ref 3.77–5.28)
RDW: 13.6 % (ref 11.7–15.4)
WBC: 12.2 x10E3/uL — ABNORMAL HIGH (ref 3.4–10.8)

## 2024-06-11 LAB — IRON,TIBC AND FERRITIN PANEL
Ferritin: 222 ng/mL — ABNORMAL HIGH (ref 15–150)
Iron Saturation: 29 % (ref 15–55)
Iron: 84 ug/dL (ref 27–159)
Total Iron Binding Capacity: 290 ug/dL (ref 250–450)
UIBC: 206 ug/dL (ref 131–425)

## 2024-06-11 NOTE — Progress Notes (Signed)
 Kidney function not at 100% but stable.  She should call to schedule follow-up appointment with her nephrologist. Mild elevation in one of her liver function test which we will observe for now. White blood cell count remains mildly elevated.  Will arrange follow-up with hematologist to evaluate further.

## 2024-06-13 ENCOUNTER — Other Ambulatory Visit: Payer: Self-pay

## 2024-06-14 ENCOUNTER — Encounter: Payer: Self-pay | Admitting: Gastroenterology

## 2024-06-16 ENCOUNTER — Encounter: Payer: Self-pay | Admitting: Pulmonary Disease

## 2024-06-16 ENCOUNTER — Ambulatory Visit: Admitting: Pulmonary Disease

## 2024-06-16 VITALS — BP 161/111 | HR 75 | Ht 60.0 in | Wt 223.0 lb

## 2024-06-16 DIAGNOSIS — Z6841 Body Mass Index (BMI) 40.0 and over, adult: Secondary | ICD-10-CM

## 2024-06-16 DIAGNOSIS — Z87891 Personal history of nicotine dependence: Secondary | ICD-10-CM

## 2024-06-16 DIAGNOSIS — E66813 Obesity, class 3: Secondary | ICD-10-CM | POA: Diagnosis not present

## 2024-06-16 DIAGNOSIS — G4733 Obstructive sleep apnea (adult) (pediatric): Secondary | ICD-10-CM | POA: Diagnosis not present

## 2024-06-16 DIAGNOSIS — I10 Essential (primary) hypertension: Secondary | ICD-10-CM

## 2024-06-16 MED ORDER — ZEPBOUND 2.5 MG/0.5ML ~~LOC~~ SOAJ
2.5000 mg | SUBCUTANEOUS | 1 refills | Status: AC
  Filled 2024-06-16: qty 2, 28d supply, fill #0

## 2024-06-16 NOTE — Assessment & Plan Note (Addendum)
 Continue CPAP at adjusted pressure of 14-20 cm H2O (increased from 5-20 cm H2O).  She will need / benefit from further incremental increases in her CPAP lower pressure limit but due to her report of difficulty tolerating very high pressures in the past we will start at 14-20).   Based on my review of her CPAP Titration, if she is unable to tolerate at least 17-18 cm H2O we will need to switch to BiLevel as her oxygen desaturations during REM remained significant at 16 cm H2O -- see hypnogram above.  Continue use of nasal pillows mask.  DME: [facility]  Orders:   Ambulatory Referral for DME

## 2024-06-16 NOTE — Progress Notes (Signed)
 "  Established Patient Pulmonology Office Visit   Subjective:  Patient ID: Janice [name], female    DOB: 1966-04-29   MRN: [id number]  CC:  Chief Complaint  Patient presents with   Obstructive Sleep Apnea    Pt states she's been off her cpap machine due to depression and the flu. Back on it now    COPD    Janice [name] is a 59 y.o. former smoker with severe OSA on CPAP, polycythemia, migraine HA, HTN, GERD, psoriasis, CKD, and obesity who presents for follow up.  COPD - moderate obstructive ventilatory deficit on PFTs with no significant BD response, normal lung volumes, and normal DLCO.  BP elevated today -- she did not take any of her antihypertensive medications yet today. She will do so immediately after this visit.  OSA: - [employer] - AutoCPAP 5-20 - Last CPAP titration showed an optimal pressure of 19 cm H2O  CPAP Data: - Pressure: Median 9.6, 95% 12.7 - Leak: Median 2.4, 95% 16.7 - Device-estimated AHI: 3.2  In contrast, during her CPAP Titration a CPAP pressure of 12-14 cm H2O was associated with a residual AHI in the 40s and severe oxygen desaturations during REM. This suggests a profound under-estimation of her true pressure requirements by her device.  Struggling with depression but some recent medication changes per patient and now seeing some mild improvement.  Pulm Questionnaires:     03/20/2023    3:00 PM  Results of the Epworth flowsheet  Sitting and reading 3  Watching TV 3  Sitting, inactive in a public place (e.g. a theatre or a meeting) 2  As a passenger in a car for an hour without a break 3  Lying down to rest in the afternoon when circumstances permit 3  Sitting and talking to someone 0  Sitting quietly after a lunch without alcohol 3  In a car, while stopped for a few minutes in traffic 0  Total score 17     ROS  Current Medications[1]      Objective:  BP (!) 161/111   Pulse 75   Ht 5' (1.524 m)   Wt 223 lb (101.2 kg)   LMP  07/20/2016   SpO2 96%   BMI 43.55 kg/m    Physical Exam Vitals reviewed.  Constitutional:      Appearance: Normal appearance. She is obese.  HENT:     Head: Normocephalic and atraumatic.  Eyes:     General: No scleral icterus.       Right eye: No discharge.        Left eye: No discharge.     Conjunctiva/sclera: Conjunctivae normal.  Pulmonary:     Effort: Pulmonary effort is normal.  Neurological:     Mental Status: She is alert and oriented to person, place, and time. Mental status is at baseline.  Psychiatric:        Behavior: Behavior normal.        Thought Content: Thought content normal.        Judgment: Judgment normal.      Diagnostic Review:    Serum HCO3: CO2  Date/Time Value Ref Range Status  06/10/2024 04:58 PM 22 20 - 29 mmol/L Final  02/19/2024 04:44 AM 20 (L) 22 - 32 mmol/L Final  02/17/2024 08:17 PM 26 22 - 32 mmol/L Final    Hemoglobin A1c:    Component Value Date/Time   HGBA1C 6.0 02/22/2024 1633   HGBA1C 5.7 (H) 11/14/2022 1644   HGBA1C  6.0 (H) 10/15/2019 1228   HGBA1C 6.1 (A) 03/06/2019 1235   HGBA1C 5.8 (H) 08/24/2016 1634    TSH: TSH  Date/Time Value Ref Range Status  05/26/2022 01:59 PM 1.610 0.350 - 4.500 uIU/mL Final    Comment:    Performed by a 3rd Generation assay with a functional sensitivity of <=0.01 uIU/mL. Performed at [facility], [address] [place], [place] [address]   01/03/2022 04:02 PM 0.96 0.35 - 5.50 uIU/mL Final  08/24/2016 04:34 PM 1.11 mIU/L Final    Comment:      Reference Range   > or = 20 Years  0.40-4.50   Pregnancy Range First trimester  0.26-2.66 Second trimester 0.55-2.73 Third trimester  0.43-2.91     09/27/2015 11:18 AM 1.24 mIU/L Final    Comment:      Reference Range   > or = 20 Years  0.40-4.50   Pregnancy Range First trimester  0.26-2.66 Second trimester 0.55-2.73 Third trimester  0.43-2.91       Iron Studies: Iron  Date/Time Value Ref Range Status   06/10/2024 03:11 PM 84 27 - 159 ug/dL Final  89/84/7974 88:57 AM 46 27 - 159 ug/dL Final   Total Iron Binding Capacity  Date/Time Value Ref Range Status  06/10/2024 03:11 PM 290 250 - 450 ug/dL Final  89/84/7974 88:57 AM 292 250 - 450 ug/dL Final   Iron Saturation  Date/Time Value Ref Range Status  06/10/2024 03:11 PM 29 15 - 55 % Final  03/19/2024 11:42 AM 16 15 - 55 % Final   Ferritin  Date/Time Value Ref Range Status  06/10/2024 03:11 PM 222 (H) 15 - 150 ng/mL Final  03/19/2024 11:42 AM 190 (H) 15 - 150 ng/mL Final  01/26/2023 03:15 PM 127 11 - 307 ng/mL Final    Comment:    Performed at [facility], [address], [place], [place] [address]  10/27/2022 03:13 PM 94 11 - 307 ng/mL Final    Comment:    Performed at [facility], [address], [place], [place] [address]  07/28/2022 01:45 PM 96 11 - 307 ng/mL Final    Comment:    Performed at [facility], [address] [place], [place] [address]    ABG:    Component Value Date/Time   TCO2 33 06/23/2011 1758     VBG: No results found for: PHVEN, PCO2VEN, PO2VEN   Sleep Studies: NPSG 07/04/22- AHI 63.4/hr, desaturation to 66%/mean 89.7%, body weight 232 lbs CPAP titration to19 on 4/22       Assessment & Plan:   Assessment & Plan OSA (obstructive sleep apnea) Continue CPAP at adjusted pressure of 14-20 cm H2O (increased from 5-20 cm H2O).  She will need / benefit from further incremental increases in her CPAP lower pressure limit but due to her report of difficulty tolerating very high pressures in the past we will start at 14-20).   Based on my review of her CPAP Titration, if she is unable to tolerate at least 17-18 cm H2O we will need to switch to BiLevel as her oxygen desaturations during REM remained significant at 16 cm H2O -- see hypnogram above.  Continue use of nasal pillows mask.  DME: [employer] [facility]  Orders:   Ambulatory Referral  for DME  Class 3 severe obesity due to excess calories with serious comorbidity and body mass index (BMI) of 40.0 to 44.9 in adult [facility]) Will start prior auth process for Zepbound .  Hypertension, unspecified type Significantly elevated BP today. Did not take BP meds today. Outlined plan for her to take these immediately upon returning home. Patient in agreement. Outlined plan to help her be more adherent with antihypertensive medication regimen moving forward.      Return in about 2 months (around 08/14/2024) for Follow up: Sleep (Other).   Time spent on day of this encounter (includes time spent face-to-face with the patient as well as time spent the same day as the encounter reviewing existing data and notes, and/or documenting my findings and the plan of care): 45 minutes  Extended amount of time spent during this visit due to need to review detailed CPAP Titration data, correlate with data from her current PAP device, address significantly elevated BP, and counsel patient about potential GLP-1 initiation.  Lamar JINNY Dales, MD     [1]  Current Outpatient Medications:    albuterol  (VENTOLIN  HFA) 108 (90 Base) MCG/ACT inhaler, Inhale 2 puffs into the lungs every 6 (six) hours as needed for wheezing or shortness of breath., Disp: 18 g, Rfl: 6   amLODipine  (NORVASC ) 10 MG tablet, Take 1 tablet (10 mg total) by mouth once nightly at bedtime., Disp: 90 tablet, Rfl: 1   aspirin  EC 81 MG tablet, Take 1 tablet (81 mg total) by mouth daily. Swallow whole., Disp: 30 tablet, Rfl: 12   atorvastatin  (LIPITOR) 40 MG tablet, Take 1 tablet (40 mg total) by mouth daily., Disp: 90 tablet, Rfl: 0   buPROPion  (WELLBUTRIN  XL) 300 MG 24 hr tablet, Take 1 tablet (300 mg total) by mouth daily., Disp: 90 tablet, Rfl: 0   gabapentin  (NEURONTIN ) 100 MG capsule, Take 1 capsule (100 mg total) by mouth 2 (two) times daily., Disp: 60 capsule, Rfl: 0   isosorbide  mononitrate (IMDUR ) 30 MG 24 hr tablet, Take  0.5 tablets (15 mg total) by mouth daily., Disp: 45 tablet, Rfl: 3   labetalol  (NORMODYNE ) 200 MG tablet, Take 1 tablet (200 mg total) by mouth 2 (two) times daily., Disp: 180 tablet, Rfl: 3   pantoprazole  (PROTONIX ) 40 MG tablet, Take 1 tablet (40 mg total) by mouth daily., Disp: 90 tablet, Rfl: 0   polyethylene glycol powder (GLYCOLAX /MIRALAX ) 17 GM/SCOOP powder, Mix 1 capful (17 g) in 4-8 ounces in water/juice by mouth daily as needed (for constipation)., Disp: 3350 g, Rfl: 1   sertraline  (ZOLOFT ) 100 MG tablet, Take 1 tablet (100 mg total) by mouth daily., Disp: 90 tablet, Rfl: 0   spironolactone  (ALDACTONE ) 25 MG tablet, Take 1 tablet (25 mg total) by mouth daily., Disp: 90 tablet, Rfl: 1   traZODone  (DESYREL ) 100 MG tablet, Take 1 tablet (100 mg total) by mouth at bedtime., Disp: 90 tablet, Rfl: 0   Vitamin D , Ergocalciferol , (DRISDOL ) 1.25 MG (50000 UNIT) CAPS capsule, Take 1 capsule (50,000 Units total) by mouth once a week., Disp: 13 capsule, Rfl: 4   calcium  carbonate (TUMS - DOSED IN MG ELEMENTAL CALCIUM ) 500 MG chewable tablet, Chew 1 tablet (200 mg of elemental calcium  total) by mouth 3 (three) times daily with meals. (Patient not taking: Reported on 06/16/2024), Disp: 90 tablet, Rfl: 0   Fremanezumab -vfrm (AJOVY ) 225 MG/1.5ML SOAJ, Inject 225 mg into the skin every 28 (twenty-eight) days. (Patient not taking: Reported on 06/16/2024), Disp: 1.5 mL, Rfl: 5   sertraline  (ZOLOFT ) 100 MG tablet, Take 1 tablet (100 mg total) by mouth daily. (Patient not taking: Reported on 06/16/2024), Disp: 90 tablet, Rfl: 0   Ubrogepant  (UBRELVY ) 100 MG TABS,  Take 100 mg by mouth daily as needed. (Patient not taking: Reported on 06/16/2024), Disp: , Rfl:   "

## 2024-06-16 NOTE — Patient Instructions (Signed)
 Frequency of equipment change: - Tubing: Every 6 months - Mask: Every 3 months - Pillows/Cushion: Every month. - Filter: Every month. - Humidification chamber: Every 6-12 months.  ---  Frequency of cleaning: - Strive for once per week.  ---  I will change your CPAP pressure settings to 14-20. If this is too high for you please call my office and I will change the setting.

## 2024-06-17 ENCOUNTER — Other Ambulatory Visit: Payer: Self-pay

## 2024-06-18 ENCOUNTER — Other Ambulatory Visit: Payer: Self-pay

## 2024-06-26 ENCOUNTER — Other Ambulatory Visit (HOSPITAL_COMMUNITY): Payer: Self-pay

## 2024-06-26 ENCOUNTER — Telehealth: Payer: Self-pay

## 2024-06-26 NOTE — Telephone Encounter (Signed)
 Pharmacy Patient Advocate Encounter   Received notification from Physician's Office that prior authorization for Zepbound  2.5mg /0.44ml is required/requested.   Insurance verification completed.   The patient is insured through [facility] MEDICAID.   Per test claim: PA required; PA submitted to above mentioned insurance via Latent Key/confirmation #/EOC [id number] Status is pending

## 2024-06-27 NOTE — Telephone Encounter (Signed)
 Pharmacy Patient Advocate Encounter  Received notification from [facility] MEDICAID that Prior Authorization for Zepbound  2.5mg /0.47ml has been APPROVED from 06/26/24 to 12/24/24   PA #/Case ID/Reference #: [id number]

## 2024-06-30 ENCOUNTER — Other Ambulatory Visit: Payer: Self-pay

## 2024-07-10 NOTE — Progress Notes (Unsigned)
 " Cardiology Office Note:   Date:  07/10/2024  ID:  Janice [name], DOB 1966-04-13, MRN [id number] PCP: Vicci Barnie NOVAK, MD  [facility] Providers Cardiologist:  Lynwood Schilling, MD {  History of Present Illness:   Janice [name] is a 59 y.o. female  with a hx of HTN, CKDIII, vitamin D  deficiency, and morbid obesity .   She was admitted 02/2024 with SOB, productive cough, flank pain, chest pain. CTA ruled out PE but did note multivessel coronary calcification. Echo LVEF 60-65%, gr1dd, normal RV. Outpatient PET with no ischemia, mild decrease in myocardial blood flow reserve which can be seen in microvascular disease. Overread with 4cm ascending TAA, mildly prominent PA, aortic atherosclerosis, atx/scarring LLB, spondylosis.  ***    ***  here to [facility] care in [facility].    [facility]ablished with [facility] 08/16/23 after referral by her PCP.  There was concern about a/ARB due to hyperkalemia though on my review isolated elevated potassium 05/01/2022 of 5.3 with labs 10 days later K4.1 raising question of hemolysis of initial sample causing phosphate elevated potassium.  Since that time potassium 3.9-4.1.  Prior to that 2016-2022 persistent hypokalemia..  Initial diagnosis hypertension at 59 year old and started antihypertensive regimen after pregnancy.  Family history of hypertension in her daughter, granddaughter.  She was using arm cuff at home with BP 120-130s over 90s.  She previously quit smoking tobacco 06/2023.  Alcohol use only socially sparingly.  She was walking working in yard for exercise and following a low-sodium diet.  Her labetalol  was increased to 150 mg twice daily for diastolic BP lowering.  Renal artery duplex 09/14/2023 with no evidence of renal artery stenosis.   Prior admission in March to [facility].    Admission 02/2024 with SOB, productive cough, flank pain, chest pain. CTA ruled out PE but did note multivessel  coronary calcification. Echo LVEF 60-65%, gr1dd, normal RV. Outpatient PET with no ischemia, mild decrease in myocardial blood flow reserve which can be seen in microvascular disease. Overread with 4cm ascending TAA, mildly prominent PA, aortic atherosclerosis, atx/scarring LLB, spondylosis.    03/19/24 seen by Dayna Dunn, PA with Labetolol increased to 200mg  BID and Imdur  15mg  added for antianginal benefit.    Presents today for follow up. Reports her chest pain is about the same. It is occurring daily at rest. For example, had some pain that did wake her up from sleep. Reviewed reassuring cardiac tests. Also notes sensation of air on her chest and wonders if her GERD is contributory to chest pain. Recently established with pain management clinic, declined injections, has been referred to a new clinic for non-invasive measures. She is eating at home, following low salt diet. She has been stretching for exercise. Reports BP at home has been good, BP yesterday at pain clinic 124/78. Reports her headaches are about the same as previous though has been taking whole tablet of Imdur  instead of prescribed half tablet.     ROS: ***  Studies Reviewed:    EKG:       ***  Risk Assessment/Calculations:   {Does this patient have ATRIAL FIBRILLATION?:[phone]} No BP recorded.  {Refresh Note OR Click here to enter BP  :1}***        Physical Exam:   VS:  LMP 07/20/2016    Wt Readings from Last 3 Encounters:  06/16/24 223 lb (101.2 kg)  06/10/24 222 lb (100.7 kg)  04/03/24 223 lb 12.8 oz (101.5 kg)  GEN: Well nourished, well developed in no acute distress NECK: No JVD; No carotid bruits CARDIAC: ***RR, *** murmurs, rubs, gallops RESPIRATORY:  Clear to auscultation without rales, wheezing or rhonchi  ABDOMEN: Soft, non-tender, non-distended EXTREMITIES:  No edema; No deformity   ASSESSMENT AND PLAN:   Nonobstructive CAD / Microvascular angina:  ***  - chest pain not improved with  antianginal therapy suggestive not cardiac in origin. GDMT aspirin  81mg  daily, atorvastatin  40mg  daily, labetolol 200mg  BID, amlodipine  10mg  daily, Imdur  15mg  daily. Due to headache, reduce Imdur  from 30mg  to 15mg  as previously prescribed.   TAA:  ***  - PET 02/27/24 ascending aorta 4.0 cm, plan for repeat imaging in 1 year. Can be coordinated at follow up. Continue optimal BP control including BB. Recommend avoidance of fluoroquinolones.    HTN:  ***  - BP in clinic 145/86 ? 128/80 without intervention. BP goal <130/80.   Continue Amlodipine  10mg  daily, Spironolactone  25mg  daily, Labetolol to 200mg  BID. Reduce Imdur  from 30mg  to 15mg  as above due to headache. Renal artery duplex 09/2023 with no stenosis.  As BP controlled, will defer further workup such as TSH, renin aldosterone, metanephrines If renin-aldosterone ordered in future will require to hold spironolactone  prior to testing Recommend aiming for 150 minutes of moderate intensity activity per week and following a heart healthy diet.     CKD:  ***  - Dates back to 2013. Follows with Dr. Howell. Careful titration of antihypertensive.     Depression:  ***  - Upcoming appointment to establish with counseling. Reviewed pursed lip breathing technique to help during times of anxiety.    OSA :  *** - CPAP compliance encouraged.      Follow up ***  Signed, Lynwood Schilling, MD   "

## 2024-07-11 ENCOUNTER — Other Ambulatory Visit: Payer: Self-pay

## 2024-07-11 ENCOUNTER — Inpatient Hospital Stay: Admitting: Hematology and Oncology

## 2024-07-11 ENCOUNTER — Ambulatory Visit: Admitting: Cardiology

## 2024-07-11 ENCOUNTER — Inpatient Hospital Stay

## 2024-07-11 DIAGNOSIS — M5430 Sciatica, unspecified side: Secondary | ICD-10-CM | POA: Insufficient documentation

## 2024-07-11 DIAGNOSIS — I7121 Aneurysm of the ascending aorta, without rupture: Secondary | ICD-10-CM

## 2024-07-11 DIAGNOSIS — G4733 Obstructive sleep apnea (adult) (pediatric): Secondary | ICD-10-CM

## 2024-07-11 DIAGNOSIS — I10 Essential (primary) hypertension: Secondary | ICD-10-CM

## 2024-07-11 DIAGNOSIS — R072 Precordial pain: Secondary | ICD-10-CM

## 2024-07-11 DIAGNOSIS — N1832 Chronic kidney disease, stage 3b: Secondary | ICD-10-CM

## 2024-07-11 DIAGNOSIS — G8929 Other chronic pain: Secondary | ICD-10-CM | POA: Insufficient documentation

## 2024-07-14 ENCOUNTER — Inpatient Hospital Stay: Admitting: Hematology

## 2024-07-14 ENCOUNTER — Inpatient Hospital Stay

## 2024-07-15 ENCOUNTER — Ambulatory Visit: Payer: Self-pay | Admitting: Internal Medicine

## 2024-09-03 ENCOUNTER — Ambulatory Visit: Admitting: Pulmonary Disease

## 2024-09-22 ENCOUNTER — Ambulatory Visit: Admitting: Neurology

## 2024-09-29 ENCOUNTER — Ambulatory Visit: Admitting: Neurology
